# Patient Record
Sex: Female | Born: 1950
Health system: Southern US, Community
[De-identification: ages and names within clinical notes are randomized; demographics above are authoritative.]

## PROBLEM LIST (undated history)

## (undated) DIAGNOSIS — R2681 Unsteadiness on feet: Secondary | ICD-10-CM

## (undated) DIAGNOSIS — J189 Pneumonia, unspecified organism: Secondary | ICD-10-CM

## (undated) DIAGNOSIS — E119 Type 2 diabetes mellitus without complications: Secondary | ICD-10-CM

## (undated) DIAGNOSIS — Z9889 Other specified postprocedural states: Secondary | ICD-10-CM

## (undated) DIAGNOSIS — R519 Headache, unspecified: Secondary | ICD-10-CM

## (undated) DIAGNOSIS — M549 Dorsalgia, unspecified: Secondary | ICD-10-CM

## (undated) DIAGNOSIS — F32A Depression, unspecified: Secondary | ICD-10-CM

## (undated) DIAGNOSIS — E039 Hypothyroidism, unspecified: Secondary | ICD-10-CM

## (undated) DIAGNOSIS — I10 Essential (primary) hypertension: Secondary | ICD-10-CM

## (undated) DIAGNOSIS — R413 Other amnesia: Secondary | ICD-10-CM

## (undated) DIAGNOSIS — F329 Major depressive disorder, single episode, unspecified: Secondary | ICD-10-CM

## (undated) DIAGNOSIS — Z87442 Personal history of urinary calculi: Secondary | ICD-10-CM

## (undated) DIAGNOSIS — G459 Transient cerebral ischemic attack, unspecified: Secondary | ICD-10-CM

## (undated) DIAGNOSIS — F419 Anxiety disorder, unspecified: Secondary | ICD-10-CM

## (undated) DIAGNOSIS — K219 Gastro-esophageal reflux disease without esophagitis: Secondary | ICD-10-CM

## (undated) DIAGNOSIS — E079 Disorder of thyroid, unspecified: Secondary | ICD-10-CM

## (undated) DIAGNOSIS — R51 Headache: Secondary | ICD-10-CM

## (undated) DIAGNOSIS — M1990 Unspecified osteoarthritis, unspecified site: Secondary | ICD-10-CM

## (undated) DIAGNOSIS — K759 Inflammatory liver disease, unspecified: Secondary | ICD-10-CM

## (undated) DIAGNOSIS — R112 Nausea with vomiting, unspecified: Secondary | ICD-10-CM

## (undated) HISTORY — PX: KNEE SURGERY: SHX244

## (undated) HISTORY — PX: APPENDECTOMY: SHX54

## (undated) HISTORY — PX: VAGINA SURGERY: SHX829

## (undated) HISTORY — PX: ROTATOR CUFF REPAIR: SHX139

## (undated) HISTORY — PX: ABDOMINAL HYSTERECTOMY: SHX81

## (undated) HISTORY — PX: OTHER SURGICAL HISTORY: SHX169

## (undated) HISTORY — PX: BACK SURGERY: SHX140

## (undated) HISTORY — PX: BREAST SURGERY: SHX581

---

## 1999-08-09 DIAGNOSIS — R609 Edema, unspecified: Secondary | ICD-10-CM | POA: Insufficient documentation

## 1999-12-15 DIAGNOSIS — B181 Chronic viral hepatitis B without delta-agent: Secondary | ICD-10-CM | POA: Insufficient documentation

## 2000-09-05 DIAGNOSIS — Z800 Family history of malignant neoplasm of digestive organs: Secondary | ICD-10-CM | POA: Insufficient documentation

## 2001-01-08 DIAGNOSIS — Z86010 Personal history of colonic polyps: Secondary | ICD-10-CM | POA: Insufficient documentation

## 2010-02-04 DIAGNOSIS — I10 Essential (primary) hypertension: Secondary | ICD-10-CM | POA: Insufficient documentation

## 2010-12-27 DIAGNOSIS — E785 Hyperlipidemia, unspecified: Secondary | ICD-10-CM | POA: Insufficient documentation

## 2012-06-08 ENCOUNTER — Encounter (HOSPITAL_COMMUNITY): Payer: Self-pay | Admitting: *Deleted

## 2012-06-08 ENCOUNTER — Emergency Department (HOSPITAL_COMMUNITY)
Admission: EM | Admit: 2012-06-08 | Discharge: 2012-06-08 | Disposition: A | Discharge status: Home / Self Care | Payer: 59 | Attending: Emergency Medicine | Admitting: Emergency Medicine

## 2012-06-08 ENCOUNTER — Emergency Department (HOSPITAL_COMMUNITY): Payer: 59

## 2012-06-08 DIAGNOSIS — E079 Disorder of thyroid, unspecified: Secondary | ICD-10-CM | POA: Insufficient documentation

## 2012-06-08 DIAGNOSIS — Z7982 Long term (current) use of aspirin: Secondary | ICD-10-CM | POA: Insufficient documentation

## 2012-06-08 DIAGNOSIS — Z79899 Other long term (current) drug therapy: Secondary | ICD-10-CM | POA: Insufficient documentation

## 2012-06-08 DIAGNOSIS — W19XXXA Unspecified fall, initial encounter: Secondary | ICD-10-CM | POA: Insufficient documentation

## 2012-06-08 DIAGNOSIS — S63509A Unspecified sprain of unspecified wrist, initial encounter: Secondary | ICD-10-CM | POA: Insufficient documentation

## 2012-06-08 DIAGNOSIS — Z87891 Personal history of nicotine dependence: Secondary | ICD-10-CM | POA: Insufficient documentation

## 2012-06-08 DIAGNOSIS — S63501A Unspecified sprain of right wrist, initial encounter: Secondary | ICD-10-CM

## 2012-06-08 DIAGNOSIS — M129 Arthropathy, unspecified: Secondary | ICD-10-CM | POA: Insufficient documentation

## 2012-06-08 DIAGNOSIS — I10 Essential (primary) hypertension: Secondary | ICD-10-CM | POA: Insufficient documentation

## 2012-06-08 DIAGNOSIS — IMO0001 Reserved for inherently not codable concepts without codable children: Secondary | ICD-10-CM | POA: Insufficient documentation

## 2012-06-08 HISTORY — DX: Essential (primary) hypertension: I10

## 2012-06-08 HISTORY — DX: Disorder of thyroid, unspecified: E07.9

## 2012-06-08 HISTORY — DX: Dorsalgia, unspecified: M54.9

## 2012-06-08 NOTE — ED Provider Notes (Signed)
History     CSN: [id number]  Arrival date & time 06/08/12  1246   First MD Initiated Contact with Patient 06/08/12 1443      Chief Complaint  Patient presents with  . Hand Injury    (Consider location/radiation/quality/duration/timing/severity/associated sxs/prior treatment) HPI Comments: Patient states she fell last night on an outstretched right hand. Patient states she did not have any problem on last evening. Today he has had pain with movement of her fingers and pain in wrist. She has not had any previous operations or procedures on the hand or wrist. The patient does state however that she has arthritis and fibromyalgia. Patient presents today for evaluation of this pain.  Patient is a 61 y.o. female presenting with hand injury. The history is provided by the patient.  Hand Injury  The incident occurred yesterday.    Past Medical History  Diagnosis Date  . Back pain   . Hypertension   . Thyroid disease     Past Surgical History  Procedure Date  . Back surgery   . Breast surgery   . Thumb surgery     left  . Appendectomy   . Abdominal hysterectomy   . Knee surgery     left    History reviewed. No pertinent family history.  History  Substance Use Topics  . Smoking status: Former Games developer  . Smokeless tobacco: Not on file  . Alcohol Use: No    OB History    Grav Para Term Preterm Abortions TAB SAB Ect Mult Living                  Review of Systems  Constitutional: Negative for activity change.       All ROS Neg except as noted in HPI  HENT: Negative for nosebleeds and neck pain.   Eyes: Negative for photophobia and discharge.  Respiratory: Negative for cough, shortness of breath and wheezing.   Cardiovascular: Negative for chest pain and palpitations.  Gastrointestinal: Negative for abdominal pain and blood in stool.  Genitourinary: Negative for dysuria, frequency and hematuria.  Musculoskeletal: Positive for back pain and arthralgias.  Skin:  Negative.   Neurological: Negative for dizziness, seizures and speech difficulty.  Psychiatric/Behavioral: Negative for hallucinations and confusion.    Allergies  Penicillins and Sulfa antibiotics  Home Medications   Current Outpatient Rx  Name Route Sig Dispense Refill  . ASPIRIN 325 MG PO TABS Oral Take 325 mg by mouth daily.    . ATENOLOL 25 MG PO TABS Oral Take 25 mg by mouth daily.    . BUPROPION HCL ER (XL) 300 MG PO TB24 Oral Take 300 mg by mouth daily.    . CYCLOBENZAPRINE HCL 10 MG PO TABS Oral Take 10 mg by mouth 3 (three) times daily as needed. For muscle relaxer    . DIAZEPAM 5 MG PO TABS Oral Take 5 mg by mouth every 6 (six) hours as needed. For anxiety    . HYDROCODONE-ACETAMINOPHEN 5-325 MG PO TABS Oral Take 1 tablet by mouth every 4 (four) hours as needed. For pain    . LEVOTHYROXINE SODIUM 100 MCG PO TABS Oral Take 100 mcg by mouth daily. Take with tablet for total dose of daily    . LEVOTHYROXINE SODIUM 175 MCG PO TABS Oral Take 175 mcg by mouth daily. Take one tablet with tablet for total dose of    . ADULT MULTIVITAMIN W/MINERALS CH Oral Take 1 tablet by mouth daily.    Marland Kitchen  POTASSIUM CHLORIDE CRYS ER 20 MEQ PO TBCR Oral Take 40 mEq by mouth 2 (two) times daily.    Marland Kitchen PRESCRIPTION MEDICATION Oral Take 1 tablet by mouth 4 (four) times daily. For diarrhea    . VERAPAMIL HCL ER 240 MG PO TBCR Oral Take 240 mg by mouth daily.      BP 141/66  Pulse 68  Temp 97.4 F (36.3 C) (Oral)  Resp 18  Ht 5\' 5"  (1.651 m)  Wt 200 lb (90.719 kg)  BMI 33.28 kg/m2  SpO2 99%  Physical Exam  Nursing note and vitals reviewed. Constitutional: She is oriented to person, place, and time. She appears well-developed and well-nourished.  Non-toxic appearance.  HENT:  Head: Normocephalic.  Right Ear: Tympanic membrane and external ear normal.  Left Ear: Tympanic membrane and external ear normal.  Eyes: EOM and lids are normal. Pupils are equal, round, and  reactive to light.  Neck: Normal range of motion. Neck supple. Carotid bruit is not present.  Cardiovascular: Normal rate, regular rhythm, normal heart sounds, intact distal pulses and normal pulses.   Pulmonary/Chest: Breath sounds normal. No respiratory distress.  Abdominal: Soft. Bowel sounds are normal. There is no tenderness. There is no guarding.  Musculoskeletal: Normal range of motion.       Patient has rings on the right index finger and right ring finger. She refuses to have these cut off because they are family rings. She states she will take them off when she returns home.  There are degenerative changes of the MP joints of the right and left hand. There is pain in the navicular area on the right. There is good range of motion of the right thumb but would soreness. There is soreness with range of motion of the right wrist. There is no deformity of the forearm. There's no deformity or effusion of the right elbow or shoulder. Pulses are symmetrical. Capillary refill is less than 3 seconds.  Lymphadenopathy:       Head (right side): No submandibular adenopathy present.       Head (left side): No submandibular adenopathy present.    She has no cervical adenopathy.  Neurological: She is alert and oriented to person, place, and time. She has normal strength. No cranial nerve deficit or sensory deficit.  Skin: Skin is warm and dry.  Psychiatric: She has a normal mood and affect. Her speech is normal.    ED Course  Procedures (including critical care time)  Labs Reviewed - No data to display Dg Wrist Complete Right  06/08/2012  *RADIOLOGY REPORT*  Clinical Data: Wrist pain after falling today.  RIGHT WRIST - COMPLETE 3+ VIEW  Comparison: None.  Findings: Degenerative changes are seen at the first carpometacarpal joint.  There is no evidence for acute fracture or subluxation.  Intercarpal spaces are normal.  IMPRESSION: No evidence for acute  abnormality.  Original Report Authenticated By:  Patterson Hammersmith, M.D.   Dg Finger Thumb Right  06/08/2012  *RADIOLOGY REPORT*  Clinical Data: Larey Seat.  Hand injury.  Pain.  RIGHT THUMB 2+V  Comparison: Views of the wrist today.  Findings: There is no evidence for acute fracture or subluxation. Note is made of degenerative changes at the first carpometacarpal joint.  No radiopaque foreign body or soft tissue gas.  IMPRESSION: No evidence for acute  abnormality.  Original Report Authenticated By: Patterson Hammersmith, M.D.     1. Sprain of wrist, right       MDM  I  have reviewed nursing notes, vital signs, and all appropriate lab and imaging results for this patient. X-ray of the right wrist reveals degenerative changes at the first carpal metacarpal joint but no fracture or dislocation. X-ray of the right thumb shows no evidence of fracture or dislocation. The plan at this time is for the patient to be placed in a thumb spica splint. She is to be seen by her physician or orthopedics for evaluation due to to tenderness in the navicular area. Patient is to return to the emergency department if any acute changes or problems.       Kathie Dike, [place] 06/08/12 [phone]

## 2012-06-08 NOTE — ED Notes (Signed)
Pt states that she fell last night and caught herself with her right hand. C/o pain on movement especially in her thumb. No swelling or deformity noted.

## 2012-06-08 NOTE — ED Provider Notes (Signed)
Medical screening examination/treatment/procedure(s) were performed by non-physician practitioner and as supervising physician I was immediately available for consultation/collaboration.   Laray Anger, DO 06/08/12 1831

## 2013-09-09 DIAGNOSIS — Z79899 Other long term (current) drug therapy: Secondary | ICD-10-CM | POA: Insufficient documentation

## 2015-01-25 DIAGNOSIS — R7301 Impaired fasting glucose: Secondary | ICD-10-CM | POA: Insufficient documentation

## 2015-03-14 ENCOUNTER — Encounter (HOSPITAL_COMMUNITY): Payer: Self-pay

## 2015-03-14 ENCOUNTER — Emergency Department (HOSPITAL_COMMUNITY): Payer: PPO

## 2015-03-14 ENCOUNTER — Emergency Department (HOSPITAL_COMMUNITY)
Admission: EM | Admit: 2015-03-14 | Discharge: 2015-03-14 | Disposition: A | Discharge status: Home / Self Care | Payer: PPO | Attending: Emergency Medicine | Admitting: Emergency Medicine

## 2015-03-14 DIAGNOSIS — R109 Unspecified abdominal pain: Secondary | ICD-10-CM

## 2015-03-14 DIAGNOSIS — Z7982 Long term (current) use of aspirin: Secondary | ICD-10-CM | POA: Diagnosis not present

## 2015-03-14 DIAGNOSIS — Z880 Allergy status to penicillin: Secondary | ICD-10-CM | POA: Insufficient documentation

## 2015-03-14 DIAGNOSIS — S90811A Abrasion, right foot, initial encounter: Secondary | ICD-10-CM | POA: Insufficient documentation

## 2015-03-14 DIAGNOSIS — S301XXA Contusion of abdominal wall, initial encounter: Secondary | ICD-10-CM | POA: Diagnosis not present

## 2015-03-14 DIAGNOSIS — S80212A Abrasion, left knee, initial encounter: Secondary | ICD-10-CM | POA: Diagnosis not present

## 2015-03-14 DIAGNOSIS — Z720 Tobacco use: Secondary | ICD-10-CM | POA: Diagnosis not present

## 2015-03-14 DIAGNOSIS — Y9289 Other specified places as the place of occurrence of the external cause: Secondary | ICD-10-CM | POA: Insufficient documentation

## 2015-03-14 DIAGNOSIS — S20469A Insect bite (nonvenomous) of unspecified back wall of thorax, initial encounter: Secondary | ICD-10-CM | POA: Diagnosis not present

## 2015-03-14 DIAGNOSIS — J159 Unspecified bacterial pneumonia: Secondary | ICD-10-CM | POA: Insufficient documentation

## 2015-03-14 DIAGNOSIS — S80211A Abrasion, right knee, initial encounter: Secondary | ICD-10-CM | POA: Diagnosis not present

## 2015-03-14 DIAGNOSIS — J189 Pneumonia, unspecified organism: Secondary | ICD-10-CM

## 2015-03-14 DIAGNOSIS — E079 Disorder of thyroid, unspecified: Secondary | ICD-10-CM | POA: Diagnosis not present

## 2015-03-14 DIAGNOSIS — I10 Essential (primary) hypertension: Secondary | ICD-10-CM | POA: Insufficient documentation

## 2015-03-14 DIAGNOSIS — R05 Cough: Secondary | ICD-10-CM

## 2015-03-14 DIAGNOSIS — Z8739 Personal history of other diseases of the musculoskeletal system and connective tissue: Secondary | ICD-10-CM | POA: Diagnosis not present

## 2015-03-14 DIAGNOSIS — W1839XA Other fall on same level, initial encounter: Secondary | ICD-10-CM | POA: Insufficient documentation

## 2015-03-14 DIAGNOSIS — S30860A Insect bite (nonvenomous) of lower back and pelvis, initial encounter: Secondary | ICD-10-CM

## 2015-03-14 DIAGNOSIS — Y998 Other external cause status: Secondary | ICD-10-CM | POA: Diagnosis not present

## 2015-03-14 DIAGNOSIS — Z79899 Other long term (current) drug therapy: Secondary | ICD-10-CM | POA: Insufficient documentation

## 2015-03-14 DIAGNOSIS — Y9389 Activity, other specified: Secondary | ICD-10-CM | POA: Insufficient documentation

## 2015-03-14 DIAGNOSIS — R059 Cough, unspecified: Secondary | ICD-10-CM

## 2015-03-14 DIAGNOSIS — W57XXXA Bitten or stung by nonvenomous insect and other nonvenomous arthropods, initial encounter: Secondary | ICD-10-CM

## 2015-03-14 LAB — BASIC METABOLIC PANEL
Anion gap: 6 (ref 5–15)
BUN: 12 mg/dL (ref 6–20)
CO2: 22 mmol/L (ref 22–32)
Calcium: 8.9 mg/dL (ref 8.9–10.3)
Chloride: 110 mmol/L (ref 101–111)
Creatinine, Ser: 0.94 mg/dL (ref 0.44–1.00)
GFR calc Af Amer: >60 mL/min (ref >60)
GLUCOSE: 120 mg/dL — AB (ref 65–99)
Potassium: 3.9 mmol/L (ref 3.5–5.1)
SODIUM: 138 mmol/L (ref 135–145)

## 2015-03-14 LAB — CBC WITH DIFFERENTIAL/PLATELET
BASOS ABS: 0 10*3/uL (ref 0.0–0.1)
BASOS PCT: 0 % (ref 0–1)
EOS ABS: 0.2 10*3/uL (ref 0.0–0.7)
EOS PCT: 2 % (ref 0–5)
HEMATOCRIT: 42.2 % (ref 36.0–46.0)
Hemoglobin: 13.4 g/dL (ref 12.0–15.0)
LYMPHS ABS: 1.8 10*3/uL (ref 0.7–4.0)
Lymphocytes Relative: 20 % (ref 12–46)
MCH: 30.4 pg (ref 26.0–34.0)
MCHC: 31.8 g/dL (ref 30.0–36.0)
MCV: 95.7 fL (ref 78.0–100.0)
MONO ABS: 0.5 10*3/uL (ref 0.1–1.0)
MONOS PCT: 6 % (ref 3–12)
Neutro Abs: 6.4 10*3/uL (ref 1.7–7.7)
Neutrophils Relative %: 72 % (ref 43–77)
PLATELETS: 274 10*3/uL (ref 150–400)
RBC: 4.41 MIL/uL (ref 3.87–5.11)
RDW: 13.3 % (ref 11.5–15.5)
WBC: 8.9 10*3/uL (ref 4.0–10.5)

## 2015-03-14 MED ORDER — SODIUM CHLORIDE 0.9 % IV BOLUS (SEPSIS)
250.0000 mL | Freq: Once | INTRAVENOUS | Status: AC
  Administered 2015-03-14: 1000 mL via INTRAVENOUS

## 2015-03-14 MED ORDER — ONDANSETRON HCL 4 MG/2ML IJ SOLN
4.0000 mg | Freq: Once | INTRAMUSCULAR | Status: AC
  Administered 2015-03-14: 4 mg via INTRAVENOUS
  Filled 2015-03-14: qty 2

## 2015-03-14 MED ORDER — DOXYCYCLINE HYCLATE 100 MG PO TABS
100.0000 mg | ORAL_TABLET | Freq: Once | ORAL | Status: AC
  Administered 2015-03-14: 100 mg via ORAL
  Filled 2015-03-14: qty 1

## 2015-03-14 MED ORDER — IPRATROPIUM-ALBUTEROL 0.5-2.5 (3) MG/3ML IN SOLN
3.0000 mL | Freq: Once | RESPIRATORY_TRACT | Status: AC
  Administered 2015-03-14: 3 mL via RESPIRATORY_TRACT
  Filled 2015-03-14: qty 3

## 2015-03-14 MED ORDER — SODIUM CHLORIDE 0.9 % IV SOLN: INTRAVENOUS | Status: DC

## 2015-03-14 MED ORDER — LEVOFLOXACIN 750 MG PO TABS: 750.0000 mg | ORAL_TABLET | Freq: Every day | ORAL | Status: DC

## 2015-03-14 MED ORDER — IOHEXOL 300 MG/ML  SOLN
100.0000 mL | Freq: Once | INTRAMUSCULAR | Status: AC | PRN
  Administered 2015-03-14: 100 mL via INTRAVENOUS

## 2015-03-14 MED ORDER — LEVOFLOXACIN IN D5W 750 MG/150ML IV SOLN
750.0000 mg | Freq: Once | INTRAVENOUS | Status: AC
  Administered 2015-03-14: 750 mg via INTRAVENOUS
  Filled 2015-03-14: qty 150

## 2015-03-14 MED ORDER — IOHEXOL 300 MG/ML  SOLN
50.0000 mL | Freq: Once | INTRAMUSCULAR | Status: AC | PRN
  Administered 2015-03-14: 50 mL via ORAL

## 2015-03-14 MED ORDER — DOXYCYCLINE HYCLATE 100 MG PO CAPS: 100.0000 mg | ORAL_CAPSULE | Freq: Two times a day (BID) | ORAL | Status: DC

## 2015-03-14 NOTE — ED Notes (Signed)
Dr Zackowski at bedside,  

## 2015-03-14 NOTE — ED Notes (Signed)
Pt assisted to restroom, tolerated well, pt and family updated on plan of care,

## 2015-03-14 NOTE — ED Notes (Signed)
Pt states that she has been having problems with coughing and not able to sleep, had started having a cough earlier in the week, fell a few days ago when her ?knee gave out on her causing her to land on her right side, pt states that the cough became worse after the fall, is productive with brown sputum, denies any fever, chills, c/o pain to right shoulder, hip area from fall,

## 2015-03-14 NOTE — ED Notes (Signed)
Pt denise any nausea or vomiting

## 2015-03-14 NOTE — ED Notes (Signed)
Pt c/o nausea, zofran given per order,

## 2015-03-14 NOTE — Discharge Instructions (Signed)
Chest x-rays shows evidence of bilateral pneumonia. Take the antibiotic Levaquin as directed for a week. Take anabolic doxycycline for the tick exposure. Continue that for 2 weeks. Usual albuterol inhaler 2 puffs every 6 hours for the next 7 days. Take Mucinex DM 12 hour capsule for the cough and congestion. Expect you to be improving in a couple days. Return for any new or worse symptoms. Make appointment to follow-up with your doctor in [place]. In addition CT showed evidence of a large stone up in the kidney area on the right side. Follow-up with your regular doctor about this. Lab and CT results provided.

## 2015-03-14 NOTE — ED Notes (Signed)
Pt c/o tick to mid upper back area, Dr Rogene Houston notified,

## 2015-03-14 NOTE — ED Notes (Signed)
resp paged for breathing treatment,  

## 2015-03-14 NOTE — ED Notes (Signed)
Reports coughing since Thursday and unable to sleep due to cough. Coughing up dark mucous. Occassional SOB. Reports falling on Right side Wednesday prior to cough starting

## 2015-03-14 NOTE — ED Provider Notes (Signed)
CSN: [id number]     Arrival date & time 03/14/15  1040 History  This chart was scribed for Courtney Sorrow, MD by Dellis Filbert, ED Scribe. The patient was seen in [facility] and the patient's care was started at 11:09 AM.  Chief Complaint  Patient presents with  . Cough   Patient is a 64 y.o. female presenting with cough. The history is provided by the patient and a relative. No language interpreter was used.  Cough Cough characteristics:  Productive Sputum characteristics:  Nondescript Severity:  Mild Onset quality:  Gradual Duration:  4 days Timing:  Intermittent Progression:  Worsening Chronicity:  Recurrent Relieved by:  Nothing Ineffective treatments: OTC cough and cold  Associated symptoms: chills, headaches, rhinorrhea and shortness of breath   Associated symptoms: no chest pain, no fever, no rash and no sore throat    HPI Comments: Courtney Orozco is a 64 y.o. female who presents to the Emergency Department complaining of a persistent, productive and intermittent cough that worsened 4 days ago. Rhinorrhea associated with her cough. She has taken an OTC cold and cough medicine. No history of emphysema, COPD or asthma. She denies fever. Pt fell 5 days ago, going across the walkway, her right knee buckled and she fell. PCP in [place]  Past Medical History  Diagnosis Date  . Back pain   . Hypertension   . Thyroid disease    Past Surgical History  Procedure Laterality Date  . Back surgery    . Breast surgery    . Thumb surgery      left  . Appendectomy    . Abdominal hysterectomy    . Knee surgery      left   No family history on file. History  Substance Use Topics  . Smoking status: Current Every Day Smoker -- 0.50 packs/day    Types: Cigarettes  . Smokeless tobacco: Not on file  . Alcohol Use: No   OB History    No data available     Review of Systems  Constitutional: Positive for chills. Negative for fever.  HENT: Positive for rhinorrhea. Negative  for sore throat.   Eyes: Negative for visual disturbance.  Respiratory: Positive for cough and shortness of breath.   Cardiovascular: Negative for chest pain and leg swelling.  Gastrointestinal: Positive for abdominal pain. Negative for nausea, vomiting and diarrhea.  Genitourinary: Negative for dysuria and hematuria.  Musculoskeletal: Positive for back pain.  Skin: Negative for rash.  Neurological: Positive for headaches.  Hematological: Bruises/bleeds easily.   Allergies  Azithromycin; Penicillins; and Sulfa antibiotics  Home Medications   Prior to Admission medications   Medication Sig Start Date End Date Taking? Authorizing Provider  aspirin 325 MG tablet Take 325 mg by mouth daily.   Yes Historical Provider, MD  buPROPion (WELLBUTRIN XL) 300 MG 24 hr tablet Take 1 tablet by mouth daily. 02/10/15  Yes Historical Provider, MD  cyclobenzaprine (FLEXERIL) 10 MG tablet Take 10 mg by mouth 3 (three) times daily as needed. For muscle relaxer   Yes Historical Provider, MD  diazepam (VALIUM) 5 MG tablet Take 5 mg by mouth every 6 (six) hours as needed. For anxiety   Yes Historical Provider, MD  HYDROcodone-acetaminophen (NORCO/VICODIN) 5-325 MG per tablet Take 1 tablet by mouth every 6 (six) hours as needed for moderate pain.  01/25/15  Yes Historical Provider, MD  levothyroxine (SYNTHROID, LEVOTHROID) 100 MCG tablet Take 100 mcg by mouth daily. Take with 113mcg tablet for total dose of  271mcg daily   Yes Historical Provider, MD  levothyroxine (SYNTHROID, LEVOTHROID) 175 MCG tablet Take 175 mcg by mouth daily. Take one tablet with 193mcg tablet for total dose of 282mcg   Yes Historical Provider, MD  Multiple Vitamin (MULTIVITAMIN WITH MINERALS) TABS Take 1 tablet by mouth daily.   Yes Historical Provider, MD  nortriptyline (PAMELOR) 25 MG capsule Take 3 capsules by mouth at bedtime.  05/13/12  Yes Historical Provider, MD  potassium chloride SA (KLOR-CON M20) 20 MEQ tablet Take 40 mEq by mouth 2  (two) times daily.   Yes Historical Provider, MD  temazepam (RESTORIL) 30 MG capsule Take 1 capsule by mouth at bedtime as needed. 03/09/15  Yes Historical Provider, MD  topiramate (TOPAMAX) 200 MG tablet Take 1 tablet by mouth 2 (two) times daily. 10/23/11  Yes Historical Provider, MD  verapamil (CALAN-SR) 240 MG CR tablet Take 240 mg by mouth daily.   Yes Historical Provider, MD  doxycycline (VIBRAMYCIN) 100 MG capsule Take 1 capsule (100 mg total) by mouth 2 (two) times daily. 03/14/15   Courtney Sorrow, MD  levofloxacin (LEVAQUIN) 750 MG tablet Take 1 tablet (750 mg total) by mouth daily. 03/14/15   Courtney Sorrow, MD   BP 106/56 mmHg  Pulse 84  Temp(Src) 97.8 F (36.6 C) (Oral)  Resp 18  Ht 5\' 5"  (1.651 m)  Wt 210 lb (95.255 kg)  BMI 34.95 kg/m2  SpO2 94% Physical Exam  Constitutional: She is oriented to person, place, and time. She appears well-developed and well-nourished. No distress.  HENT:  Head: Normocephalic and atraumatic.  Mucous membranes are moist.  Eyes: Conjunctivae and EOM are normal. Pupils are equal, round, and reactive to light. No scleral icterus.  Neck: Normal range of motion.  Cardiovascular: Normal rate, regular rhythm and normal heart sounds.   No murmur heard. Pulmonary/Chest: Effort normal and breath sounds normal. No respiratory distress. She has no wheezes.  Musculoskeletal: Normal range of motion. She exhibits no edema.  A large engorged tic measuring probably about 7 mm in size the mid back, sort of at the junction between the lumbar and thoracic area.  Neurological: She is alert and oriented to person, place, and time. No cranial nerve deficit.  Skin: Skin is warm and dry.  Bruise RLQ 5 cm by 2 cm. Abrasion top of her right foot, as well on the left and right knees.  Psychiatric: She has a normal mood and affect. Her behavior is normal.  Nursing note and vitals reviewed.   ED Course  Procedures  DIAGNOSTIC STUDIES: Oxygen Saturation is 97% on room  air, normal by my interpretation.    COORDINATION OF CARE: 11:15 AM Discussed treatment plan with pt at bedside and pt agreed to plan. Labs Review Results for orders placed or performed during the hospital encounter of 03/14/15  CBC with Differential  Result Value Ref Range   WBC 8.9 4.0 - 10.5 K/uL   RBC 4.41 3.87 - 5.11 MIL/uL   Hemoglobin 13.4 12.0 - 15.0 g/dL   HCT 42.2 36.0 - 46.0 %   MCV 95.7 78.0 - 100.0 fL   MCH 30.4 26.0 - 34.0 pg   MCHC 31.8 30.0 - 36.0 g/dL   RDW 13.3 11.5 - 15.5 %   Platelets 274 150 - 400 K/uL   Neutrophils Relative % 72 43 - 77 %   Neutro Abs 6.4 1.7 - 7.7 K/uL   Lymphocytes Relative 20 12 - 46 %   Lymphs Abs 1.8 0.7 -  4.0 K/uL   Monocytes Relative 6 3 - 12 %   Monocytes Absolute 0.5 0.1 - 1.0 K/uL   Eosinophils Relative 2 0 - 5 %   Eosinophils Absolute 0.2 0.0 - 0.7 K/uL   Basophils Relative 0 0 - 1 %   Basophils Absolute 0.0 0.0 - 0.1 K/uL  Basic metabolic panel  Result Value Ref Range   Sodium 138 135 - 145 mmol/L   Potassium 3.9 3.5 - 5.1 mmol/L   Chloride 110 101 - 111 mmol/L   CO2 22 22 - 32 mmol/L   Glucose, Bld 120 (H) 65 - 99 mg/dL   BUN 12 6 - 20 mg/dL   Creatinine, Ser 0.94 0.44 - 1.00 mg/dL   Calcium 8.9 8.9 - 10.3 mg/dL   GFR calc non Af Amer >60 >60 mL/min   GFR calc Af Amer >60 >60 mL/min   Anion gap 6 5 - 15   Dg Chest 2 View  03/14/2015   CLINICAL DATA:  Cough for the past week.  EXAM: CHEST  2 VIEW  COMPARISON:  None.  FINDINGS: Normal sized heart. Right diaphragmatic eventration with underlying gas distended hepatic flexure. Small amount of linear and patchy density in the left lower lobe. Diffuse osteopenia.  IMPRESSION: 1. Mild left lower lobe atelectasis and possible pneumonia. 2. Gas distended colon interposition beneath a large right diaphragmatic eventration.   Electronically Signed   By: Claudie Revering M.D.   On: 03/14/2015 11:49   Ct Chest Wo Contrast  03/14/2015   CLINICAL DATA:  Productive cough with brown sputum.  Abdominal pain. History of fall 1 week ago.  EXAM: CT CHEST WITHOUT CONTRAST  CT ABDOMEN AND PELVIS WITH CONTRAST  TECHNIQUE: Multidetector CT imaging of the chest was performed following the standard protocol without IV contrast. CT imaging of the abdomen and pelvis was performed following the administration of intravenous contrast.  CONTRAST:  100 cubic cm Omnipaque 300 IV  COMPARISON:  None.  FINDINGS: CT CHEST FINDINGS  Mediastinum/Nodes: Heart is normal size. Aortic calcifications. No aneurysm. No mediastinal, hilar, or axillary adenopathy.  Lungs/Pleura: Airspace opacities are noted in both lower lobes, left slightly greater than right concerning for pneumonia. Remainder of the lungs are clear. No pleural effusions.  Chest wall: Chest wall soft tissues are unremarkable.  Musculoskeletal: No acute bony abnormality or focal bone lesion.  CT ABDOMEN PELVIS FINDINGS  Hepatobiliary: No biliary ductal dilatation or focal hepatic lesion. Gallbladder grossly unremarkable.  Pancreas: No focal abnormality or ductal dilatation.  Spleen: No focal abnormality.  Normal size.  Adrenals/Urinary Tract: 8-9 mm stone in the right renal pelvis. Slight fullness of the right renal pelvis with apparent mild wall enhancement. No ureteral dilatation or ureteral stone. Urinary bladder is unremarkable. Left kidney unremarkable.  Stomach/Bowel: Stomach, large and small bowel grossly unremarkable.  Vascular/Lymphatic: Go  Reproductive: No mass or other significant abnormality. Prior hysterectomy.  Other: No free fluid or free air.  Musculoskeletal: No focal bone lesion or acute bony abnormality.  IMPRESSION: Bilateral lower lobe airspace opacities, left slightly greater than right concerning for multifocal pneumonia.  8-9 mm right renal pelvic stone. There is mild wall thickening and enhancement of the renal pelvic wall. Cannot exclude infection. Recommend clinical correlation for evidence of UTI.   Electronically Signed   By: Rolm Baptise M.D.   On: 03/14/2015 14:50   Ct Abdomen Pelvis W Contrast  03/14/2015   CLINICAL DATA:  Productive cough with brown sputum. Abdominal pain. History of  fall 1 week ago.  EXAM: CT CHEST WITHOUT CONTRAST  CT ABDOMEN AND PELVIS WITH CONTRAST  TECHNIQUE: Multidetector CT imaging of the chest was performed following the standard protocol without IV contrast. CT imaging of the abdomen and pelvis was performed following the administration of intravenous contrast.  CONTRAST:  100 cubic cm Omnipaque 300 IV  COMPARISON:  None.  FINDINGS: CT CHEST FINDINGS  Mediastinum/Nodes: Heart is normal size. Aortic calcifications. No aneurysm. No mediastinal, hilar, or axillary adenopathy.  Lungs/Pleura: Airspace opacities are noted in both lower lobes, left slightly greater than right concerning for pneumonia. Remainder of the lungs are clear. No pleural effusions.  Chest wall: Chest wall soft tissues are unremarkable.  Musculoskeletal: No acute bony abnormality or focal bone lesion.  CT ABDOMEN PELVIS FINDINGS  Hepatobiliary: No biliary ductal dilatation or focal hepatic lesion. Gallbladder grossly unremarkable.  Pancreas: No focal abnormality or ductal dilatation.  Spleen: No focal abnormality.  Normal size.  Adrenals/Urinary Tract: 8-9 mm stone in the right renal pelvis. Slight fullness of the right renal pelvis with apparent mild wall enhancement. No ureteral dilatation or ureteral stone. Urinary bladder is unremarkable. Left kidney unremarkable.  Stomach/Bowel: Stomach, large and small bowel grossly unremarkable.  Vascular/Lymphatic: Go  Reproductive: No mass or other significant abnormality. Prior hysterectomy.  Other: No free fluid or free air.  Musculoskeletal: No focal bone lesion or acute bony abnormality.  IMPRESSION: Bilateral lower lobe airspace opacities, left slightly greater than right concerning for multifocal pneumonia.  8-9 mm right renal pelvic stone. There is mild wall thickening and enhancement of the  renal pelvic wall. Cannot exclude infection. Recommend clinical correlation for evidence of UTI.   Electronically Signed   By: Rolm Baptise M.D.   On: 03/14/2015 14:50   Dg Chest Port 1 View  03/14/2015   CLINICAL DATA:  Cough for the past week.  EXAM: PORTABLE CHEST - 1 VIEW  COMPARISON:  Earlier today.  FINDINGS: The current image was obtained to decrease the exposure technique. This results in an underexposed image with poor visualization of the left lung base. The remainder of the lungs are clear. The other previously described findings are unchanged.  IMPRESSION: Underexposed image with no additional information provided. Please see the previous report.   Electronically Signed   By: Claudie Revering M.D.   On: 03/14/2015 12:22    Medications  0.9 %  sodium chloride infusion (not administered)  levofloxacin (LEVAQUIN) IVPB 750 mg (750 mg Intravenous New Bag/Given 03/14/15 1536)  ipratropium-albuterol (DUONEB) 0.5-2.5 (3) MG/3ML nebulizer solution 3 mL (3 mLs Nebulization Given 03/14/15 1131)  sodium chloride 0.9 % bolus 250 mL (0 mLs Intravenous Stopped 03/14/15 1342)  ondansetron (ZOFRAN) injection 4 mg (4 mg Intravenous Given 03/14/15 1334)  iohexol (OMNIPAQUE) 300 MG/ML solution 50 mL (50 mLs Oral Contrast Given 03/14/15 1351)  iohexol (OMNIPAQUE) 300 MG/ML solution 100 mL (100 mLs Intravenous Contrast Given 03/14/15 1352)  doxycycline (VIBRA-TABS) tablet 100 mg (100 mg Oral Given 03/14/15 1536)     MDM   Final diagnoses:  Abdominal pain  CAP (community acquired pneumonia)  Tick bite of back, initial encounter   Patient with several significant findings during the stay. Patient came in for persistent cough since Thursday. Also coughing up a dark mucus. Some shortness of breath but just intermittent. Patient also had a fall earlier this week landing on her right side and had a bruise to her right lower quadrant. Denied any significant abdominal pain.  Record chest x-ray raises some  concerns for  possible pneumonia. CT of chest was ordered which confirmed a multifocal pneumonia. The record chest x-ray also raises some concerns about some abnormalities in the upper abdomen. Since CT of abdomen and pelvis was ordered particular in light of the recent fall and trauma. Results of that were negative except for the finding of a very large right renal pelvis stone. Patient thinks maybe that's been present in the past. Does not seem to be having any symptoms from this. Follow-up will be important.  Also we discovered a very large tic in the midportion of her back between in the upper lumbar lower thoracic area this tick was removed.   Patients will require treatment for the pneumonia she has a penicillin and Zithromax allergy so patient started on Levaquin. Patient has been on that in the past and is something that works well for her prior episodes of pneumonia. Patient has not been hospitalized recently so this is a community-acquired pneumonia. In addition for the tick bite patient started on doxycycline. Doxycycline may have been adequate for a community-acquired pneumonia however patient was very adamant that she really prefers taking Levaquin so patient started on both. The docs cycling dyes for coverage of any tickborne illness due to the tick exposure.  Patient has albuterol and his next DM available at home she will continue those. Patient given a nebulizer treatment here which helped improve her breathing she never had any wheezing. Patient's oxygen saturations of anywhere from 91% to 97%. Patient in no acute respiratory distress. Patient has a primary care Dr. in the [place] area.  As stated above tick removed. Removed with tweezers head completely removed. No evidence of significant infection. No rash.     I personally performed the services described in this documentation, which was scribed in my presence. The recorded information has been reviewed and is accurate.       Courtney Sorrow, MD 03/14/15 [phone]

## 2015-03-19 ENCOUNTER — Ambulatory Visit (INDEPENDENT_AMBULATORY_CARE_PROVIDER_SITE_OTHER): Payer: PPO | Admitting: Urology

## 2015-03-19 DIAGNOSIS — R312 Other microscopic hematuria: Secondary | ICD-10-CM | POA: Diagnosis not present

## 2015-03-19 DIAGNOSIS — N200 Calculus of kidney: Secondary | ICD-10-CM | POA: Diagnosis not present

## 2015-03-19 DIAGNOSIS — N390 Urinary tract infection, site not specified: Secondary | ICD-10-CM

## 2015-03-26 DIAGNOSIS — L304 Erythema intertrigo: Secondary | ICD-10-CM | POA: Insufficient documentation

## 2015-03-30 ENCOUNTER — Other Ambulatory Visit: Payer: Self-pay | Admitting: Urology

## 2015-04-01 ENCOUNTER — Encounter (HOSPITAL_COMMUNITY): Payer: Self-pay | Admitting: *Deleted

## 2015-04-05 MED ORDER — GENTAMICIN SULFATE 40 MG/ML IJ SOLN
5.0000 mg/kg | INTRAVENOUS | Status: AC
  Administered 2015-04-06: 476.4 mg via INTRAVENOUS
  Filled 2015-04-05: qty 12

## 2015-04-05 NOTE — H&P (Signed)
Active Problems  1. Microscopic hematuria (R31.2)  2. Nephrolithiasis (N20.0)  3. Pyuria (N39.0)  History of Present Illness   Kamelia was in the emergency room Sunday for pneumonia and had a CT because the CXR was not revealing.  She was found to have an 8-77mm right renal pelvic stone without obstruction.   She has never been treated for a stone before.  She had a bladder tack remotely.  She has had no recent UTI's.   She is on Levaquin and Doxycycline for the pneumonia.  She has had occasional discomfort in her right lower abdomen.  The pain is moderate.   She has chronic back pain with radiculopathy.  She has been on topamax for 5 years.   Past Medical History  1. History of Anxiety (F41.9)  2. History of arthritis (Z87.39)  3. History of depression (Z86.59)  4. History of diabetes mellitus (Z86.39)  5. History of esophageal reflux (Z87.19)  6. History of hepatitis (Z86.19)  7. History of hypercholesterolemia (Z86.39)  8. History of hypertension (Z86.79)  9. History of hyperthyroidism (Z86.39)  10. History of migraine headaches (Z86.69)  11. History of Recurrent nephrolithiasis (N20.0)  Surgical History  1. History of Anterior Colporrhaphy, Repair Of Cystocele  2. History of Appendectomy  3. History of Arthrodesis Thumb Carpometacarpal Joint  4. History of Back Surgery  5. History of Biopsy Breast Open  6. History of Knee Surgery  7. History of Shoulder Surgery Left  8. History of Total Abdominal Hysterectomy With Removal Of Both Ovaries  9. History of Treatment Of The Right Ankle  Current Meds  1. Aspirin 325 MG Oral Tablet;  Therapy: (Recorded:27May2016) to Recorded  2. BuPROPion HCl ER (XL) 300 MG Oral Tablet Extended Release 24 Hour;  Therapy: (Recorded:27May2016) to Recorded  3. Cyclobenzaprine HCl - 5 MG Oral Tablet;  Therapy: (Recorded:27May2016) to Recorded  4. Diazepam 5 MG Oral Tablet;  Therapy: (Recorded:27May2016) to Recorded  5. Hydrocodone-Acetaminophen 5-325  MG Oral Tablet;  Therapy: (Recorded:27May2016) to Recorded  6. Levothyroxine Sodium 100 MCG Oral Tablet;  Therapy: (Recorded:27May2016) to Recorded  7. Nortriptyline HCl CAPS;  Therapy: (Recorded:27May2016) to Recorded  8. Potassium Chloride 20 MEQ TBCR;  Therapy: (Recorded:27May2016) to Recorded  9. Probiotic Oral Capsule;  Therapy: (Recorded:27May2016) to Recorded  10. Temazepam 30 MG Oral Capsule;   Therapy: (Recorded:27May2016) to Recorded  11. Topiramate 100 MG Oral Tablet;   Therapy: (Recorded:27May2016) to Recorded  12. Verapamil HCl ER 240 MG Oral Capsule Extended Release 24 Hour;   Therapy: (Recorded:27May2016) to Recorded  Allergies  1. Cipro TABS  2. Penicillins  3. Sulfa Drugs  Family History  1. Family history of Death : Father  2. Family history of cardiac disorder (Z82.49) : Father  3. Family history of prostate cancer (Z80.42) : Grandparent  4. Family history of Hematuria, microscopic : Grandparent  5. Family history of Recurrent nephrolithiasis : Grandparent  Social History   Denied: History of Alcohol use   Caffeine use (F15.90)   6-8   Disabled   Married   Never used tobacco (Z78.9)   Number of children   2 daughters  Review of Systems Genitourinary, constitutional, skin, eye, otolaryngeal, hematologic/lymphatic, cardiovascular, pulmonary, endocrine, musculoskeletal, gastrointestinal, neurological and psychiatric system(s) were reviewed and pertinent findings if present are noted and are otherwise negative.  Genitourinary: dysuria, nocturia, urinary hesitancy and urinary stream starts and stops.  Gastrointestinal: nausea.  Constitutional: night sweats and feeling tired (fatigue).  Hematologic/Lymphatic: a tendency to easily bruise.  Respiratory:  shortness of breath and cough.  Endocrine: polydipsia.  Musculoskeletal: back pain and joint pain.  Neurological: headache.  Psychiatric: depression and anxiety.    Vitals Vital Signs [Data  Includes: Last 1 Day]  Recorded: [id number] 02:19PM  Height: 5 ft 5 in Weight: 210 lb  BMI Calculated: 34.95 BSA Calculated: 2.02 Blood Pressure: 128 / 79 Temperature: 98.1 F Heart Rate: 97  Physical Exam Constitutional: Well nourished and well developed . No acute distress. Obese.  ENT:. The ears and nose are normal in appearance.  Neck: The appearance of the neck is normal and no neck mass is present.  Pulmonary: No respiratory distress and normal respiratory rhythm and effort.  Cardiovascular: Heart rate and rhythm are normal . No peripheral edema.  Abdomen: The abdomen is obese. The abdomen is soft and nontender. No masses are palpated. moderate right CVA tenderness and no left CVA tenderness. No hernias are palpable. No hepatosplenomegaly noted.  Lymphatics: The posterior cervical and supraclavicular nodes are not enlarged or tender.  Skin: Normal skin turgor, no visible rash and no visible skin lesions.  Neuro/Psych:. Mood and affect are appropriate.    Results/Data Urine [Data Includes: Last 1 Day]   [id number]  COLOR YELLOW   APPEARANCE CLOUDY   SPECIFIC GRAVITY 1.025   pH 5.0   GLUCOSE NEG mg/dL  BILIRUBIN NEG   KETONE NEG mg/dL  BLOOD LARGE   PROTEIN 30 mg/dL  UROBILINOGEN 0.2 mg/dL  NITRITE NEG   LEUKOCYTE ESTERASE MOD   SQUAMOUS EPITHELIAL/HPF RARE   WBC 3-6 WBC/hpf  RBC 7-10 RBC/hpf  BACTERIA MODERATE   CRYSTALS NONE SEEN   CASTS NONE SEEN   Other QNS TO SPIN    The following images/tracing/specimen were independently visualized:  I have reviewed her CT films and report.  The following clinical lab reports were reviewed:  UA reviewed along with hospital labs which are unremarkable.    Assessment  1. Nephrolithiasis (N20.0)  2. Microscopic hematuria (R31.2)  3. Pyuria (N39.0)   She has a symptomatic 50mm right renal stone which is lateral in the pelvis and may actually be hung up in a proximal infundibulum.    Her UA is abnormal but is possibly  contaminated as she is on levaquin and doxycycline.   Plan Microscopic hematuria   1. URINE CULTURE; Status:Hold For - Specimen/Data Collection,Appointment; Requested  for:27May2016;  Nephrolithiasis   2. Follow-up Schedule Surgery Office  Follow-up  Status: Hold For - Appointment   Requested for: [id number]  3. UA With REFLEX; [Do Not Release]; Status:Hold For - [employer];  Requested for:27May2016;    I discussed ESWL vs Ureteroscopy and she would like to proceed with ureteroscopy.  I reviewed the risks of bleeding, infection, ureteral injury, need for a stent and secondary procedures, thrombotic events and anesthetic risks. I will get her cleared by her PCP because of recent pneumonia.   Discussion/Summary  CC: Dr. Loletha Carrow in [place].

## 2015-04-06 ENCOUNTER — Ambulatory Visit (HOSPITAL_COMMUNITY): Payer: PPO | Admitting: Certified Registered"

## 2015-04-06 ENCOUNTER — Ambulatory Visit (HOSPITAL_COMMUNITY)
Admission: RE | Admit: 2015-04-06 | Discharge: 2015-04-07 | Disposition: A | Discharge status: Home / Self Care | Payer: PPO | Source: Ambulatory Visit | Attending: Urology | Admitting: Urology

## 2015-04-06 ENCOUNTER — Encounter (HOSPITAL_COMMUNITY): Payer: Self-pay | Admitting: *Deleted

## 2015-04-06 ENCOUNTER — Encounter (HOSPITAL_COMMUNITY)
Admission: RE | Disposition: A | Discharge status: Home / Self Care | Payer: Self-pay | Source: Ambulatory Visit | Attending: Urology

## 2015-04-06 DIAGNOSIS — Z882 Allergy status to sulfonamides status: Secondary | ICD-10-CM | POA: Diagnosis not present

## 2015-04-06 DIAGNOSIS — R05 Cough: Secondary | ICD-10-CM | POA: Insufficient documentation

## 2015-04-06 DIAGNOSIS — M549 Dorsalgia, unspecified: Secondary | ICD-10-CM | POA: Diagnosis not present

## 2015-04-06 DIAGNOSIS — I10 Essential (primary) hypertension: Secondary | ICD-10-CM | POA: Diagnosis not present

## 2015-04-06 DIAGNOSIS — Z87891 Personal history of nicotine dependence: Secondary | ICD-10-CM | POA: Diagnosis not present

## 2015-04-06 DIAGNOSIS — N8110 Cystocele, unspecified: Secondary | ICD-10-CM | POA: Diagnosis not present

## 2015-04-06 DIAGNOSIS — Z90710 Acquired absence of both cervix and uterus: Secondary | ICD-10-CM | POA: Diagnosis not present

## 2015-04-06 DIAGNOSIS — N3289 Other specified disorders of bladder: Secondary | ICD-10-CM | POA: Diagnosis not present

## 2015-04-06 DIAGNOSIS — K219 Gastro-esophageal reflux disease without esophagitis: Secondary | ICD-10-CM | POA: Diagnosis not present

## 2015-04-06 DIAGNOSIS — E039 Hypothyroidism, unspecified: Secondary | ICD-10-CM | POA: Insufficient documentation

## 2015-04-06 DIAGNOSIS — Z7982 Long term (current) use of aspirin: Secondary | ICD-10-CM | POA: Diagnosis not present

## 2015-04-06 DIAGNOSIS — F419 Anxiety disorder, unspecified: Secondary | ICD-10-CM | POA: Insufficient documentation

## 2015-04-06 DIAGNOSIS — G8929 Other chronic pain: Secondary | ICD-10-CM | POA: Insufficient documentation

## 2015-04-06 DIAGNOSIS — N200 Calculus of kidney: Secondary | ICD-10-CM | POA: Diagnosis not present

## 2015-04-06 DIAGNOSIS — M1990 Unspecified osteoarthritis, unspecified site: Secondary | ICD-10-CM | POA: Insufficient documentation

## 2015-04-06 DIAGNOSIS — Z881 Allergy status to other antibiotic agents status: Secondary | ICD-10-CM | POA: Insufficient documentation

## 2015-04-06 DIAGNOSIS — F329 Major depressive disorder, single episode, unspecified: Secondary | ICD-10-CM | POA: Insufficient documentation

## 2015-04-06 DIAGNOSIS — R51 Headache: Secondary | ICD-10-CM | POA: Diagnosis not present

## 2015-04-06 DIAGNOSIS — Z8619 Personal history of other infectious and parasitic diseases: Secondary | ICD-10-CM | POA: Insufficient documentation

## 2015-04-06 DIAGNOSIS — E780 Pure hypercholesterolemia: Secondary | ICD-10-CM | POA: Diagnosis not present

## 2015-04-06 DIAGNOSIS — Z880 Allergy status to penicillin: Secondary | ICD-10-CM | POA: Diagnosis not present

## 2015-04-06 DIAGNOSIS — R312 Other microscopic hematuria: Secondary | ICD-10-CM | POA: Diagnosis not present

## 2015-04-06 DIAGNOSIS — E119 Type 2 diabetes mellitus without complications: Secondary | ICD-10-CM | POA: Diagnosis not present

## 2015-04-06 HISTORY — DX: Headache: R51

## 2015-04-06 HISTORY — DX: Major depressive disorder, single episode, unspecified: F32.9

## 2015-04-06 HISTORY — DX: Hypothyroidism, unspecified: E03.9

## 2015-04-06 HISTORY — DX: Unspecified osteoarthritis, unspecified site: M19.90

## 2015-04-06 HISTORY — DX: Gastro-esophageal reflux disease without esophagitis: K21.9

## 2015-04-06 HISTORY — DX: Other specified postprocedural states: R11.2

## 2015-04-06 HISTORY — PX: CYSTOSCOPY WITH URETEROSCOPY, STONE BASKETRY AND STENT PLACEMENT: SHX6378

## 2015-04-06 HISTORY — DX: Anxiety disorder, unspecified: F41.9

## 2015-04-06 HISTORY — DX: Pneumonia, unspecified organism: J18.9

## 2015-04-06 HISTORY — DX: Headache, unspecified: R51.9

## 2015-04-06 HISTORY — DX: Inflammatory liver disease, unspecified: K75.9

## 2015-04-06 HISTORY — DX: Depression, unspecified: F32.A

## 2015-04-06 HISTORY — PX: HOLMIUM LASER APPLICATION: SHX5852

## 2015-04-06 HISTORY — DX: Other specified postprocedural states: Z98.890

## 2015-04-06 HISTORY — DX: Type 2 diabetes mellitus without complications: E11.9

## 2015-04-06 LAB — GLUCOSE, CAPILLARY: Glucose-Capillary: 138 mg/dL — ABNORMAL HIGH (ref 65–99)

## 2015-04-06 SURGERY — CYSTOSCOPY, WITH CALCULUS MANIPULATION OR REMOVAL
Anesthesia: General | Laterality: Right

## 2015-04-06 MED ORDER — NORTRIPTYLINE HCL 25 MG PO CAPS
75.0000 mg | ORAL_CAPSULE | Freq: Every day | ORAL | Status: DC
  Administered 2015-04-06: 75 mg via ORAL
  Filled 2015-04-06 (×2): qty 3

## 2015-04-06 MED ORDER — PHENAZOPYRIDINE HCL 200 MG PO TABS: 200.0000 mg | ORAL_TABLET | Freq: Three times a day (TID) | ORAL | Status: DC | PRN

## 2015-04-06 MED ORDER — LEVOTHYROXINE SODIUM 100 MCG PO TABS
100.0000 ug | ORAL_TABLET | Freq: Every day | ORAL | Status: DC
  Administered 2015-04-06 – 2015-04-07 (×2): 100 ug via ORAL
  Filled 2015-04-06 (×2): qty 1

## 2015-04-06 MED ORDER — ONDANSETRON HCL 4 MG/2ML IJ SOLN
INTRAMUSCULAR | Status: DC | PRN
  Administered 2015-04-06: 4 mg via INTRAVENOUS

## 2015-04-06 MED ORDER — BISACODYL 10 MG RE SUPP: 10.0000 mg | Freq: Every day | RECTAL | Status: DC | PRN

## 2015-04-06 MED ORDER — DEXAMETHASONE SODIUM PHOSPHATE 10 MG/ML IJ SOLN
INTRAMUSCULAR | Status: AC
  Filled 2015-04-06: qty 1

## 2015-04-06 MED ORDER — DIAZEPAM 5 MG PO TABS: 5.0000 mg | ORAL_TABLET | Freq: Three times a day (TID) | ORAL | Status: DC | PRN

## 2015-04-06 MED ORDER — LIDOCAINE HCL (CARDIAC) 20 MG/ML IV SOLN
INTRAVENOUS | Status: DC | PRN
  Administered 2015-04-06: 30 mg via INTRAVENOUS

## 2015-04-06 MED ORDER — MIDAZOLAM HCL 5 MG/5ML IJ SOLN
INTRAMUSCULAR | Status: DC | PRN
  Administered 2015-04-06 (×2): 1 mg via INTRAVENOUS

## 2015-04-06 MED ORDER — DIPHENHYDRAMINE HCL 12.5 MG/5ML PO ELIX: 12.5000 mg | ORAL_SOLUTION | Freq: Four times a day (QID) | ORAL | Status: DC | PRN

## 2015-04-06 MED ORDER — ONDANSETRON HCL 4 MG/2ML IJ SOLN
INTRAMUSCULAR | Status: AC
  Filled 2015-04-06: qty 2

## 2015-04-06 MED ORDER — VERAPAMIL HCL ER 240 MG PO TBCR
240.0000 mg | EXTENDED_RELEASE_TABLET | Freq: Every day | ORAL | Status: DC
  Administered 2015-04-07: 240 mg via ORAL
  Filled 2015-04-06: qty 1

## 2015-04-06 MED ORDER — HYDROMORPHONE HCL 1 MG/ML IJ SOLN
INTRAMUSCULAR | Status: AC
  Administered 2015-04-06: 1 mg via INTRAVENOUS
  Filled 2015-04-06: qty 1

## 2015-04-06 MED ORDER — BUPROPION HCL ER (XL) 300 MG PO TB24
300.0000 mg | ORAL_TABLET | Freq: Every day | ORAL | Status: DC
  Administered 2015-04-06 – 2015-04-07 (×2): 300 mg via ORAL
  Filled 2015-04-06 (×2): qty 1

## 2015-04-06 MED ORDER — TOPIRAMATE 100 MG PO TABS
200.0000 mg | ORAL_TABLET | Freq: Every day | ORAL | Status: DC
  Administered 2015-04-06: 200 mg via ORAL
  Filled 2015-04-06 (×2): qty 2

## 2015-04-06 MED ORDER — HYDROCODONE-ACETAMINOPHEN 5-325 MG PO TABS
1.0000 | ORAL_TABLET | Freq: Four times a day (QID) | ORAL | Status: DC | PRN
Start: 2015-04-06 — End: 2015-04-07
  Administered 2015-04-06 – 2015-04-07 (×2): 1 via ORAL
  Filled 2015-04-06 (×2): qty 1

## 2015-04-06 MED ORDER — ACETAMINOPHEN 325 MG PO TABS
650.0000 mg | ORAL_TABLET | ORAL | Status: DC | PRN
  Administered 2015-04-06: 650 mg via ORAL
  Filled 2015-04-06: qty 2

## 2015-04-06 MED ORDER — LACTATED RINGERS IV SOLN: INTRAVENOUS | Status: DC

## 2015-04-06 MED ORDER — LEVOTHYROXINE SODIUM 25 MCG PO TABS
175.0000 ug | ORAL_TABLET | Freq: Every day | ORAL | Status: DC
  Administered 2015-04-06 – 2015-04-07 (×2): 175 ug via ORAL
  Filled 2015-04-06: qty 1
  Filled 2015-04-06 (×2): qty 3
  Filled 2015-04-06: qty 1

## 2015-04-06 MED ORDER — DEXAMETHASONE SODIUM PHOSPHATE 10 MG/ML IJ SOLN
INTRAMUSCULAR | Status: DC | PRN
  Administered 2015-04-06: 10 mg via INTRAVENOUS

## 2015-04-06 MED ORDER — LACTATED RINGERS IV SOLN
INTRAVENOUS | Status: DC | PRN
  Administered 2015-04-06: 10:00:00 via INTRAVENOUS

## 2015-04-06 MED ORDER — HYDROCODONE-ACETAMINOPHEN 5-325 MG PO TABS
ORAL_TABLET | ORAL | Status: AC
  Filled 2015-04-06: qty 1

## 2015-04-06 MED ORDER — OXYBUTYNIN CHLORIDE 5 MG PO TABS: 5.0000 mg | ORAL_TABLET | Freq: Three times a day (TID) | ORAL | Status: DC | PRN

## 2015-04-06 MED ORDER — IOHEXOL 300 MG/ML  SOLN
INTRAMUSCULAR | Status: DC | PRN
  Administered 2015-04-06: 5 mL

## 2015-04-06 MED ORDER — PROPOFOL 10 MG/ML IV BOLUS
INTRAVENOUS | Status: DC | PRN
  Administered 2015-04-06: 130 mg via INTRAVENOUS

## 2015-04-06 MED ORDER — DIPHENHYDRAMINE HCL 50 MG/ML IJ SOLN: 12.5000 mg | Freq: Four times a day (QID) | INTRAMUSCULAR | Status: DC | PRN

## 2015-04-06 MED ORDER — FENTANYL CITRATE (PF) 100 MCG/2ML IJ SOLN
INTRAMUSCULAR | Status: DC | PRN
  Administered 2015-04-06 (×2): 50 ug via INTRAVENOUS

## 2015-04-06 MED ORDER — HYDROCODONE-ACETAMINOPHEN 5-325 MG PO TABS
1.0000 | ORAL_TABLET | Freq: Once | ORAL | Status: AC
  Administered 2015-04-06: 1 via ORAL

## 2015-04-06 MED ORDER — SODIUM CHLORIDE 0.9 % IR SOLN
Status: DC | PRN
  Administered 2015-04-06: 6000 mL via INTRAVESICAL

## 2015-04-06 MED ORDER — HYDROCODONE-ACETAMINOPHEN 5-325 MG PO TABS: 1.0000 | ORAL_TABLET | Freq: Four times a day (QID) | ORAL | Status: DC | PRN

## 2015-04-06 MED ORDER — HYDROMORPHONE HCL 1 MG/ML IJ SOLN
0.5000 mg | INTRAMUSCULAR | Status: DC | PRN
  Administered 2015-04-06 (×2): 1 mg via INTRAVENOUS
  Administered 2015-04-06: 0.5 mg via INTRAVENOUS
  Administered 2015-04-07: 1 mg via INTRAVENOUS
  Filled 2015-04-06 (×4): qty 1

## 2015-04-06 MED ORDER — POTASSIUM CHLORIDE CRYS ER 20 MEQ PO TBCR
40.0000 meq | EXTENDED_RELEASE_TABLET | Freq: Two times a day (BID) | ORAL | Status: DC
  Administered 2015-04-06 – 2015-04-07 (×2): 40 meq via ORAL
  Filled 2015-04-06 (×2): qty 2

## 2015-04-06 MED ORDER — ONDANSETRON HCL 4 MG/2ML IJ SOLN
4.0000 mg | INTRAMUSCULAR | Status: DC | PRN
Start: 2015-04-06 — End: 2015-04-07
  Administered 2015-04-06 – 2015-04-07 (×2): 4 mg via INTRAVENOUS
  Filled 2015-04-06 (×2): qty 2

## 2015-04-06 MED ORDER — KCL IN DEXTROSE-NACL 20-5-0.45 MEQ/L-%-% IV SOLN
INTRAVENOUS | Status: DC
  Filled 2015-04-06: qty 1000

## 2015-04-06 MED ORDER — FENTANYL CITRATE (PF) 100 MCG/2ML IJ SOLN
INTRAMUSCULAR | Status: AC
  Filled 2015-04-06: qty 2

## 2015-04-06 MED ORDER — PROPOFOL 10 MG/ML IV BOLUS
INTRAVENOUS | Status: AC
  Filled 2015-04-06: qty 20

## 2015-04-06 MED ORDER — CYCLOBENZAPRINE HCL 10 MG PO TABS: 10.0000 mg | ORAL_TABLET | Freq: Three times a day (TID) | ORAL | Status: DC | PRN

## 2015-04-06 MED ORDER — HYDROMORPHONE HCL 1 MG/ML IJ SOLN
0.2500 mg | INTRAMUSCULAR | Status: DC | PRN
  Administered 2015-04-06: 0.25 mg via INTRAVENOUS

## 2015-04-06 MED ORDER — SCOPOLAMINE 1 MG/3DAYS TD PT72
1.0000 | MEDICATED_PATCH | Freq: Once | TRANSDERMAL | Status: AC
  Administered 2015-04-06: 1 via TRANSDERMAL
  Filled 2015-04-06: qty 1

## 2015-04-06 MED ORDER — MIDAZOLAM HCL 2 MG/2ML IJ SOLN
INTRAMUSCULAR | Status: AC
  Filled 2015-04-06: qty 2

## 2015-04-06 SURGICAL SUPPLY — 26 items
BAG URO CATCHER STRL LF (DRAPE) ×3 IMPLANT
BASKET LASER NITINOL 1.9FR (BASKET) ×2 IMPLANT
BASKET STONE NCOMPASS (UROLOGICAL SUPPLIES) IMPLANT
BSKT STON RTRVL 120 1.9FR (BASKET) ×1
CATH URET 5FR 28IN OPEN ENDED (CATHETERS) ×2 IMPLANT
CLOTH BEACON ORANGE TIMEOUT ST (SAFETY) ×3 IMPLANT
EXTRACTOR STONE NITINOL NGAGE (UROLOGICAL SUPPLIES) ×2 IMPLANT
FIBER LASER FLEXIVA 1000 (UROLOGICAL SUPPLIES) IMPLANT
FIBER LASER FLEXIVA 200 (UROLOGICAL SUPPLIES) IMPLANT
FIBER LASER FLEXIVA 365 (UROLOGICAL SUPPLIES) IMPLANT
FIBER LASER FLEXIVA 550 (UROLOGICAL SUPPLIES) IMPLANT
FIBER LASER TRAC TIP (UROLOGICAL SUPPLIES) ×2 IMPLANT
GLOVE SURG SS PI 8.0 STRL IVOR (GLOVE) IMPLANT
GOWN STRL REUS W/TWL XL LVL3 (GOWN DISPOSABLE) ×3 IMPLANT
GUIDEWIRE STR DUAL SENSOR (WIRE) ×3 IMPLANT
IV NS 1000ML (IV SOLUTION) ×3
IV NS 1000ML BAXH (IV SOLUTION) ×1 IMPLANT
IV NS IRRIG 3000ML ARTHROMATIC (IV SOLUTION) ×3 IMPLANT
MANIFOLD NEPTUNE II (INSTRUMENTS) ×3 IMPLANT
PACK CYSTO (CUSTOM PROCEDURE TRAY) ×3 IMPLANT
SET IRRIG Y TYPE TUR BLADDER L (SET/KITS/TRAYS/PACK) ×2 IMPLANT
SHEATH ACCESS URETERAL 38CM (SHEATH) ×5 IMPLANT
SHEATH URET ACCESS 10/12FR (MISCELLANEOUS) IMPLANT
STENT URET 6FRX24 CONTOUR (STENTS) ×2 IMPLANT
TUBING CONNECTING 10 (TUBING) ×2 IMPLANT
TUBING CONNECTING 10' (TUBING) ×1

## 2015-04-06 NOTE — Anesthesia Postprocedure Evaluation (Signed)
  Anesthesia Post-op Note  Patient: Courtney Orozco  Procedure(s) Performed: Procedure(s): CYSTOSCOPY WITH RIGHT URETEROSCOPY, STONE BASKETRY AND STENT PLACEMENT (Right) HOLMIUM LASER APPLICATION (Right)  Patient Location: PACU  Anesthesia Type:General  Level of Consciousness: awake and alert   Airway and Oxygen Therapy: Patient Spontanous Breathing  Post-op Pain: moderate  Post-op Assessment: Post-op Vital signs reviewed, Patient's Cardiovascular Status Stable and Respiratory Function Stable  Post-op Vital Signs: Reviewed  Filed Vitals:   04/06/15 1315  BP: 104/83  Pulse: 88  Temp:   Resp: 20    Complications: No apparent anesthesia complications

## 2015-04-06 NOTE — Transfer of Care (Signed)
Immediate Anesthesia Transfer of Care Note  Patient: Courtney Orozco  Procedure(s) Performed: Procedure(s): CYSTOSCOPY WITH RIGHT URETEROSCOPY, STONE BASKETRY AND STENT PLACEMENT (Right) HOLMIUM LASER APPLICATION (Right)  Patient Location: PACU  Anesthesia Type:General  Level of Consciousness:  sedated, patient cooperative and responds to stimulation  Airway & Oxygen Therapy:Patient Spontanous Breathing and Patient connected to face mask oxgen  Post-op Assessment:  Report given to PACU RN and Post -op Vital signs reviewed and stable  Post vital signs:  Reviewed and stable  Last Vitals:  Filed Vitals:   04/06/15 0737  BP: 137/61  Pulse: 94  Temp: 36.7 C  Resp: 18    Complications: No apparent anesthesia complications

## 2015-04-06 NOTE — Anesthesia Preprocedure Evaluation (Addendum)
Anesthesia Evaluation  Patient identified by MRN, date of birth, ID band Patient awake    Reviewed: Allergy & Precautions, H&P , NPO status , Patient's Chart, lab work & pertinent test results  History of Anesthesia Complications (+) history of anesthetic complications  Airway Mallampati: II  TM Distance: >3 FB Neck ROM: Full    Dental no notable dental hx. (+) Edentulous Upper, Edentulous Lower, Dental Advisory Given   Pulmonary neg pulmonary ROS, former smoker,  breath sounds clear to auscultation  Pulmonary exam normal       Cardiovascular hypertension, Pt. on medications Rhythm:Regular Rate:Normal     Neuro/Psych  Headaches, Anxiety Depression    GI/Hepatic Neg liver ROS, GERD-  Medicated and Controlled,  Endo/Other  diabetesHypothyroidism   Renal/GU negative Renal ROS  negative genitourinary   Musculoskeletal  (+) Arthritis -, Osteoarthritis,    Abdominal   Peds  Hematology negative hematology ROS (+)   Anesthesia Other Findings   Reproductive/Obstetrics negative OB ROS                            Anesthesia Physical Anesthesia Plan  ASA: II  Anesthesia Plan: General   Post-op Pain Management:    Induction: Intravenous  Airway Management Planned: LMA  Additional Equipment:   Intra-op Plan:   Post-operative Plan: Extubation in OR  Informed Consent: I have reviewed the patients History and Physical, chart, labs and discussed the procedure including the risks, benefits and alternatives for the proposed anesthesia with the patient or authorized representative who has indicated his/her understanding and acceptance.   Dental advisory given  Plan Discussed with: CRNA  Anesthesia Plan Comments:         Anesthesia Quick Evaluation

## 2015-04-06 NOTE — Progress Notes (Signed)
Received patient via wheel chair from PACU. Pt s/p cystoscopy with right ureteroscopy, stent placement and stone removal. Pt was to be discharge from PACU but was unable to maintain adequate oxygen saturation. Per report pt oxygen was in the low 90's and was placed on 3 L [place]. On arrival to floor, pt was awake and alert, complained of nausea, headache, back and leg pain. Pt taken off oxygen to assess saturation. Pt remained 93% on room air while at rest. Pt assisted to bed, Dr. Baltazar Najjar of urology came and evaluated patient with plans on discharge home tomorrow.

## 2015-04-06 NOTE — Interval H&P Note (Signed)
History and Physical Interval Note:  04/06/2015 9:55 AM  Courtney Orozco  has presented today for surgery, with the diagnosis of RIGHT RENAL PELVIC STONE  The various methods of treatment have been discussed with the patient and family. After consideration of risks, benefits and other options for treatment, the patient has consented to  Procedure(s): CYSTOSCOPY WITH RIGHT URETEROSCOPY, STONE BASKETRY AND POSSIBLE STENT PLACEMENT (Right) HOLMIUM LASER APPLICATION (Right) as a surgical intervention .  The patient's history has been reviewed, patient examined, no change in status, stable for surgery.  I have reviewed the patient's chart and labs.  Questions were answered to the patient's satisfaction.     Jaimee Corum J

## 2015-04-06 NOTE — Progress Notes (Signed)
Patient still complains of headache despite dilaudid and tylenol. Will re medicate and re evaluate.

## 2015-04-06 NOTE — Discharge Instructions (Signed)
CYSTOSCOPY HOME CARE INSTRUCTIONS  Activity: Rest for the remainder of the day.  Do not drive or operate equipment today.  You may resume normal activities in one to two days as instructed by your physician.   Meals: Drink plenty of liquids and eat light foods such as gelatin or soup this evening.  You may return to a normal meal plan tomorrow.  Return to Work: You may return to work in one to two days or as instructed by your physician.  Special Instructions / Symptoms: Call your physician if any of these symptoms occur:   -persistent or heavy bleeding  -bleeding which continues after first few urination  -large blood clots that are difficult to pass  -urine stream diminishes or stops completely  -fever equal to or higher than 101 degrees Farenheit.  -cloudy urine with a strong, foul odor  -severe pain  Females should always wipe from front to back after elimination.  You may feel some burning pain when you urinate.  This should disappear with time.  Applying moist heat to the lower abdomen or a hot tub bath may help relieve the pain. \  Please bring your stones to the office for analysis.    Patient Signature:  ________________________________________________________  Nurse's Signature:  ________________________________________________________

## 2015-04-06 NOTE — Brief Op Note (Signed)
04/06/2015  11:06 AM  PATIENT:  Gillermina Hu  64 y.o. female  PRE-OPERATIVE DIAGNOSIS:  RIGHT RENAL PELVIC STONE  POST-OPERATIVE DIAGNOSIS:  RIGHT RENAL PELVIC STONE  PROCEDURE:  Procedure(s): CYSTOSCOPY WITH RIGHT URETEROSCOPY, STONE BASKETRY AND POSSIBLE STENT PLACEMENT (Right) HOLMIUM LASER APPLICATION (Right)  SURGEON:  Surgeon(s) and Role:    * Irine Seal, MD - Primary  PHYSICIAN ASSISTANT:   ASSISTANTS: none   ANESTHESIA:   general  EBL:     BLOOD ADMINISTERED:none  DRAINS: 6 x 24 right JJ stent   LOCAL MEDICATIONS USED:  NONE  SPECIMEN:  Source of Specimen:  stone fragments  DISPOSITION OF SPECIMEN:  to patient to bring to office  COUNTS:  YES  TOURNIQUET:  * No tourniquets in log *  DICTATION: .Other Dictation: Dictation Number [id number]  PLAN OF CARE: Discharge to home after PACU  PATIENT DISPOSITION:  PACU - hemodynamically stable.   Delay start of Pharmacological VTE agent (>24hrs) due to surgical blood loss or risk of bleeding: not applicable

## 2015-04-06 NOTE — Anesthesia Procedure Notes (Signed)
Procedure Name: LMA Insertion Date/Time: 04/06/2015 10:08 AM Performed by: Lajuana Carry E Pre-anesthesia Checklist: Patient identified, Emergency Drugs available, Suction available and Patient being monitored Patient Re-evaluated:Patient Re-evaluated prior to inductionOxygen Delivery Method: Circle system utilized Preoxygenation: Pre-oxygenation with 100% oxygen Intubation Type: IV induction Ventilation: Mask ventilation without difficulty LMA: LMA inserted LMA Size: 4.0 Number of attempts: 1 Placement Confirmation: positive ETCO2 and breath sounds checked- equal and bilateral Dental Injury: Teeth and Oropharynx as per pre-operative assessment

## 2015-04-07 ENCOUNTER — Encounter (HOSPITAL_COMMUNITY): Payer: Self-pay | Admitting: Urology

## 2015-04-07 DIAGNOSIS — N200 Calculus of kidney: Secondary | ICD-10-CM | POA: Diagnosis not present

## 2015-04-07 NOTE — Op Note (Signed)
NAMEKEYLEE, SHRESTHA                 ACCOUNT NO.:  [id number]  MEDICAL RECORD NO.:  [id number]  LOCATION:  5329                         FACILITY:  [facility]  PHYSICIAN:  Marshall Cork. Jeffie Pollock, M.D.    DATE OF BIRTH:  October 10, 1951  DATE OF PROCEDURE:  04/06/2015 DATE OF DISCHARGE:                              OPERATIVE REPORT   PROCEDURES: 1. Cystoscopy with right retrograde pyelogram and interpretation. 2. Right ureteroscopy with holmium lasertripsy and stone extraction. 3. Insertion of right double-J stent.  PREOPERATIVE DIAGNOSIS:  Right renal pelvic stone.  POSTOPERATIVE DIAGNOSIS:  Right renal pelvic stone.  SURGEON:  Marshall Cork. Jeffie Pollock, M.D.  ANESTHESIA:  General.  SPECIMEN:  Stone fragments.  DRAINS:  A 6-French 24 cm double-J stent.  BLOOD LOSS:  Minimal.  COMPLICATIONS:  None.  INDICATIONS:  Ms. Butrick is a 64 year old white female, who was found to have approximately 8 mm stone in the right renal pelvis.  It was felt to possibly be causing some infundibular obstruction as well.  It was felt that ureteroscopy was indicated consistent with poorly visualized on plain film.  FINDINGS OF PROCEDURE:  She was given Cipro.  She was taken to the operating room where general anesthetic was induced.  She was placed in lithotomy position.  Her perineum and genitalia were prepped with Betadine solution.  She was draped in usual sterile fashion.  Cystoscopy was performed using a 22-French scope and 30-degree lens. Examination revealed a normal urethra.  She had a cystocele, so the ureters were angled down into the pelvis, but were otherwise unremarkable.  The bladder wall had mild trabeculation.  No tumors, stones, or inflammation.  The right ureteral orifice was cannulated with 5-French open-end catheter and contrast was instilled.  This demonstrated a normal ureter from the bladder to the UPJ.  There was a mild UPJ obstruction or filling defect in the middle of the pelvis consistent with  the stone. The remainder of the collecting system was delicate.  A guidewire was then passed through the open-end catheter in the kidney and the cystoscope was removed.  A 12-French introducer sheath inner core 38 cm in length was then passed easily over the wire to the kidney to dilate the ureter.  The sheath was then reassembled and inserted to the level of the kidney.  The inner core and wire were then removed.  The dual-lumen with digital flexible scope was then passed through the sheath to the kidney and the stone was identified.  A 200 micron tract-tip laser fiber was then advanced.  It was initially set on 0.5 watts and 10 Hz, but this was increased to 1 watt after the initial shocks of the stone appeared to be somewhat durable.  The stone was then fragmented into several fragments, but she had significant respiratory excursion and during one renal movement while active laser was going on, the tip of the laser touched the mucosa and there was onset of bleeding.  At this point, I added a second irrigation tubing to the smaller port and had 2 pressure bags to help increase flow.  With this, I was eventually able to flush the kidney sufficiently to see the fragments.  They were grasped with NGage basket and removed some of them and required additional lasering during removal, but that was made simple with the dual-lumen scope.  Once all significant fragments were removed, the bleeding seemed to have stopped.  The scope was removed and the guidewire was reinserted to the kidney.  The sheath was then removed.  The cystoscope was reinserted over the wire and a 6-French 24 cm double-J stent with out-string was then inserted to the kidney under fluoroscopic guidance.  The wire was removed leaving good coil in the kidney and good coil in the bladder. Final fluoroscopic imaging demonstrated possibly 1 small residual fragment, smaller than the diameter of the stent.  At this point,  the bladder was drained.  The cystoscope was removed. The patient was taken down from lithotomy position.  Her anesthetic was reversed.  She was moved to recovery room in stable condition.  There were no complications.     Marshall Cork. Jeffie Pollock, M.D.     JJW/MEDQ  D:  04/06/2015  T:  04/07/2015  Job:  [id number]

## 2015-04-07 NOTE — Discharge Summary (Signed)
Physician Discharge Summary  Patient ID: RHEMI BALBACH MRN: [id number] DOB/AGE: [birth year] 64 y.o.  Admit date: 04/06/2015 Discharge date: 04/07/2015  Admission Diagnoses:  Renal calculus, right  Discharge Diagnoses:  Principal Problem:   Renal calculus, right   Past Medical History  Diagnosis Date  . Back pain   . Hypertension   . Thyroid disease   . PONV (postoperative nausea and vomiting)   . Hypothyroidism   . Pneumonia     hx of 01/2015   . Anxiety   . Depression   . Diabetes mellitus without complication     no meds in 5 years   . GERD (gastroesophageal reflux disease)   . Headache     hx of migraines   . Arthritis   . Hepatitis     hx of hep B - 44 years ago     Surgeries: Procedure(s): CYSTOSCOPY WITH RIGHT URETEROSCOPY, STONE BASKETRY AND STENT PLACEMENT HOLMIUM LASER APPLICATION on 01/05/4007   Consultants (if any):    Discharged Condition: Improved  Hospital Course: SHACOYA BURKHAMMER is an 64 y.o. female who was admitted 04/06/2015 with a diagnosis of Renal calculus, right and went to the operating room on 04/06/2015 and underwent the above named procedures.  She required O2 support post op and was admitted for observation.  She is doing well this morning with normal O2 levels on RA.  She has no pain.   She will be discharged.   She was given perioperative antibiotics:      Anti-infectives    Start     Dose/Rate Route Frequency Ordered Stop   04/05/15 1424  gentamicin (GARAMYCIN) 480 mg in dextrose 5 % 100 mL IVPB     5 mg/kg  95.3 kg 112 mL/hr over 60 Minutes Intravenous 30 min pre-op 04/05/15 1424 04/06/15 1030    .  She was given sequential compression devices for DVT prophylaxis.  She benefited maximally from the hospital stay and there were no complications.    Recent vital signs:  Filed Vitals:   04/07/15 0532  BP: 124/59  Pulse: 98  Temp: 98.3 F (36.8 C)  Resp: 18    Recent laboratory studies:  Lab Results  Component Value Date    HGB 13.4 03/14/2015   Lab Results  Component Value Date   WBC 8.9 03/14/2015   PLT 274 03/14/2015   No results found for: INR Lab Results  Component Value Date   NA 138 03/14/2015   K 3.9 03/14/2015   CL 110 03/14/2015   CO2 22 03/14/2015   BUN 12 03/14/2015   CREATININE 0.94 03/14/2015   GLUCOSE 120* 03/14/2015    Discharge Medications:     Medication List    TAKE these medications        aspirin 325 MG tablet  Take 325 mg by mouth daily.     buPROPion 300 MG 24 hr tablet  Commonly known as:  WELLBUTRIN XL  Take 300 mg by mouth daily.     clotrimazole 1 % cream  Commonly known as:  LOTRIMIN  Apply 1 application topically 2 (two) times daily.     CRANBERRY PO  Take 1 tablet by mouth daily.     cyclobenzaprine 10 MG tablet  Commonly known as:  FLEXERIL  Take 10 mg by mouth 3 (three) times daily as needed for muscle spasms.     diazepam 5 MG tablet  Commonly known as:  VALIUM  Take 5 mg by mouth 3 (three) times  daily as needed for anxiety.     doxycycline 100 MG capsule  Commonly known as:  VIBRAMYCIN  Take 1 capsule (100 mg total) by mouth 2 (two) times daily.     HYDROcodone-acetaminophen 5-325 MG per tablet  Commonly known as:  NORCO/VICODIN  Take 1 tablet by mouth every 6 (six) hours as needed for moderate pain.     KLOR-CON M20 20 MEQ tablet  Generic drug:  potassium chloride SA  Take 40 mEq by mouth 2 (two) times daily.     levofloxacin 750 MG tablet  Commonly known as:  LEVAQUIN  Take 1 tablet (750 mg total) by mouth daily.     levothyroxine 100 MCG tablet  Commonly known as:  SYNTHROID, LEVOTHROID  Take 100 mcg by mouth daily. Take with 123mcg tablet for total dose of 221mcg daily     levothyroxine 175 MCG tablet  Commonly known as:  SYNTHROID, LEVOTHROID  Take 175 mcg by mouth daily. Take one tablet with 145mcg tablet for total dose of 280mcg     multivitamin with minerals Tabs tablet  Take 1 tablet by mouth daily.     nortriptyline 25  MG capsule  Commonly known as:  PAMELOR  Take 75 mg by mouth at bedtime.     phenazopyridine 200 MG tablet  Commonly known as:  PYRIDIUM  Take 1 tablet (200 mg total) by mouth 3 (three) times daily as needed for pain.     PROBIOTIC DAILY PO  Take 1 tablet by mouth daily.     temazepam 30 MG capsule  Commonly known as:  RESTORIL  Take 30 mg by mouth at bedtime as needed for sleep.     topiramate 200 MG tablet  Commonly known as:  TOPAMAX  Take 200 mg by mouth at bedtime.     verapamil 240 MG CR tablet  Commonly known as:  CALAN-SR  Take 240 mg by mouth daily.        Diagnostic Studies: Dg Chest 2 View  03/14/2015   CLINICAL DATA:  Cough for the past week.  EXAM: CHEST  2 VIEW  COMPARISON:  None.  FINDINGS: Normal sized heart. Right diaphragmatic eventration with underlying gas distended hepatic flexure. Small amount of linear and patchy density in the left lower lobe. Diffuse osteopenia.  IMPRESSION: 1. Mild left lower lobe atelectasis and possible pneumonia. 2. Gas distended colon interposition beneath a large right diaphragmatic eventration.   Electronically Signed   By: Claudie Revering M.D.   On: 03/14/2015 11:49   Ct Chest Wo Contrast  03/14/2015   CLINICAL DATA:  Productive cough with brown sputum. Abdominal pain. History of fall 1 week ago.  EXAM: CT CHEST WITHOUT CONTRAST  CT ABDOMEN AND PELVIS WITH CONTRAST  TECHNIQUE: Multidetector CT imaging of the chest was performed following the standard protocol without IV contrast. CT imaging of the abdomen and pelvis was performed following the administration of intravenous contrast.  CONTRAST:  100 cubic cm Omnipaque 300 IV  COMPARISON:  None.  FINDINGS: CT CHEST FINDINGS  Mediastinum/Nodes: Heart is normal size. Aortic calcifications. No aneurysm. No mediastinal, hilar, or axillary adenopathy.  Lungs/Pleura: Airspace opacities are noted in both lower lobes, left slightly greater than right concerning for pneumonia. Remainder of the lungs  are clear. No pleural effusions.  Chest wall: Chest wall soft tissues are unremarkable.  Musculoskeletal: No acute bony abnormality or focal bone lesion.  CT ABDOMEN PELVIS FINDINGS  Hepatobiliary: No biliary ductal dilatation or focal hepatic lesion. Gallbladder  grossly unremarkable.  Pancreas: No focal abnormality or ductal dilatation.  Spleen: No focal abnormality.  Normal size.  Adrenals/Urinary Tract: 8-9 mm stone in the right renal pelvis. Slight fullness of the right renal pelvis with apparent mild wall enhancement. No ureteral dilatation or ureteral stone. Urinary bladder is unremarkable. Left kidney unremarkable.  Stomach/Bowel: Stomach, large and small bowel grossly unremarkable.  Vascular/Lymphatic: Go  Reproductive: No mass or other significant abnormality. Prior hysterectomy.  Other: No free fluid or free air.  Musculoskeletal: No focal bone lesion or acute bony abnormality.  IMPRESSION: Bilateral lower lobe airspace opacities, left slightly greater than right concerning for multifocal pneumonia.  8-9 mm right renal pelvic stone. There is mild wall thickening and enhancement of the renal pelvic wall. Cannot exclude infection. Recommend clinical correlation for evidence of UTI.   Electronically Signed   By: Rolm Baptise M.D.   On: 03/14/2015 14:50   Ct Abdomen Pelvis W Contrast  03/14/2015   CLINICAL DATA:  Productive cough with brown sputum. Abdominal pain. History of fall 1 week ago.  EXAM: CT CHEST WITHOUT CONTRAST  CT ABDOMEN AND PELVIS WITH CONTRAST  TECHNIQUE: Multidetector CT imaging of the chest was performed following the standard protocol without IV contrast. CT imaging of the abdomen and pelvis was performed following the administration of intravenous contrast.  CONTRAST:  100 cubic cm Omnipaque 300 IV  COMPARISON:  None.  FINDINGS: CT CHEST FINDINGS  Mediastinum/Nodes: Heart is normal size. Aortic calcifications. No aneurysm. No mediastinal, hilar, or axillary adenopathy.  Lungs/Pleura:  Airspace opacities are noted in both lower lobes, left slightly greater than right concerning for pneumonia. Remainder of the lungs are clear. No pleural effusions.  Chest wall: Chest wall soft tissues are unremarkable.  Musculoskeletal: No acute bony abnormality or focal bone lesion.  CT ABDOMEN PELVIS FINDINGS  Hepatobiliary: No biliary ductal dilatation or focal hepatic lesion. Gallbladder grossly unremarkable.  Pancreas: No focal abnormality or ductal dilatation.  Spleen: No focal abnormality.  Normal size.  Adrenals/Urinary Tract: 8-9 mm stone in the right renal pelvis. Slight fullness of the right renal pelvis with apparent mild wall enhancement. No ureteral dilatation or ureteral stone. Urinary bladder is unremarkable. Left kidney unremarkable.  Stomach/Bowel: Stomach, large and small bowel grossly unremarkable.  Vascular/Lymphatic: Go  Reproductive: No mass or other significant abnormality. Prior hysterectomy.  Other: No free fluid or free air.  Musculoskeletal: No focal bone lesion or acute bony abnormality.  IMPRESSION: Bilateral lower lobe airspace opacities, left slightly greater than right concerning for multifocal pneumonia.  8-9 mm right renal pelvic stone. There is mild wall thickening and enhancement of the renal pelvic wall. Cannot exclude infection. Recommend clinical correlation for evidence of UTI.   Electronically Signed   By: Rolm Baptise M.D.   On: 03/14/2015 14:50   Dg Chest Port 1 View  03/14/2015   CLINICAL DATA:  Cough for the past week.  EXAM: PORTABLE CHEST - 1 VIEW  COMPARISON:  Earlier today.  FINDINGS: The current image was obtained to decrease the exposure technique. This results in an underexposed image with poor visualization of the left lung base. The remainder of the lungs are clear. The other previously described findings are unchanged.  IMPRESSION: Underexposed image with no additional information provided. Please see the previous report.   Electronically Signed   By:  Claudie Revering M.D.   On: 03/14/2015 12:22    Disposition: 01-Home or Self Care    Follow-up Information    Follow up with  Malka So, MD On 04/23/2015.   Specialty:  Urology   Contact information:   [address] [place] [address] [phone]        Signed: Malka So 04/07/2015, 8:14 AM

## 2015-04-23 ENCOUNTER — Ambulatory Visit (INDEPENDENT_AMBULATORY_CARE_PROVIDER_SITE_OTHER): Payer: PPO | Admitting: Urology

## 2015-04-23 ENCOUNTER — Other Ambulatory Visit: Payer: Self-pay | Admitting: Urology

## 2015-04-23 DIAGNOSIS — N200 Calculus of kidney: Secondary | ICD-10-CM

## 2015-05-26 ENCOUNTER — Ambulatory Visit (HOSPITAL_COMMUNITY)
Admission: RE | Admit: 2015-05-26 | Discharge: 2015-05-26 | Disposition: A | Discharge status: Home / Self Care | Payer: PPO | Source: Ambulatory Visit | Attending: Urology | Admitting: Urology

## 2015-05-26 DIAGNOSIS — N200 Calculus of kidney: Secondary | ICD-10-CM | POA: Diagnosis not present

## 2015-05-26 DIAGNOSIS — N1330 Unspecified hydronephrosis: Secondary | ICD-10-CM | POA: Insufficient documentation

## 2015-06-08 ENCOUNTER — Other Ambulatory Visit: Payer: Self-pay | Admitting: Urology

## 2015-06-08 ENCOUNTER — Ambulatory Visit (HOSPITAL_COMMUNITY)
Admission: RE | Admit: 2015-06-08 | Discharge: 2015-06-08 | Disposition: A | Discharge status: Home / Self Care | Payer: PPO | Source: Ambulatory Visit | Attending: Urology | Admitting: Urology

## 2015-06-08 DIAGNOSIS — N200 Calculus of kidney: Secondary | ICD-10-CM | POA: Diagnosis present

## 2015-06-11 ENCOUNTER — Ambulatory Visit (INDEPENDENT_AMBULATORY_CARE_PROVIDER_SITE_OTHER): Payer: PPO | Admitting: Urology

## 2015-06-11 DIAGNOSIS — N200 Calculus of kidney: Secondary | ICD-10-CM

## 2015-09-03 ENCOUNTER — Ambulatory Visit (HOSPITAL_COMMUNITY)
Admission: RE | Admit: 2015-09-03 | Discharge: 2015-09-03 | Disposition: A | Discharge status: Home / Self Care | Payer: PPO | Source: Ambulatory Visit | Attending: Urology | Admitting: Urology

## 2015-09-03 ENCOUNTER — Other Ambulatory Visit: Payer: Self-pay | Admitting: Urology

## 2015-09-03 ENCOUNTER — Ambulatory Visit (INDEPENDENT_AMBULATORY_CARE_PROVIDER_SITE_OTHER): Payer: PPO | Admitting: Urology

## 2015-09-03 DIAGNOSIS — N390 Urinary tract infection, site not specified: Secondary | ICD-10-CM | POA: Diagnosis not present

## 2015-09-03 DIAGNOSIS — N200 Calculus of kidney: Secondary | ICD-10-CM

## 2015-10-22 DIAGNOSIS — N183 Chronic kidney disease, stage 3 unspecified: Secondary | ICD-10-CM | POA: Insufficient documentation

## 2015-10-29 DIAGNOSIS — R001 Bradycardia, unspecified: Secondary | ICD-10-CM | POA: Diagnosis not present

## 2015-10-29 DIAGNOSIS — M5126 Other intervertebral disc displacement, lumbar region: Secondary | ICD-10-CM | POA: Diagnosis not present

## 2015-10-29 DIAGNOSIS — M4806 Spinal stenosis, lumbar region: Secondary | ICD-10-CM | POA: Diagnosis not present

## 2015-10-30 DIAGNOSIS — R001 Bradycardia, unspecified: Secondary | ICD-10-CM | POA: Diagnosis not present

## 2015-12-13 DIAGNOSIS — L859 Epidermal thickening, unspecified: Secondary | ICD-10-CM | POA: Diagnosis not present

## 2015-12-13 DIAGNOSIS — D485 Neoplasm of uncertain behavior of skin: Secondary | ICD-10-CM | POA: Diagnosis not present

## 2015-12-31 ENCOUNTER — Ambulatory Visit (INDEPENDENT_AMBULATORY_CARE_PROVIDER_SITE_OTHER): Payer: PPO | Admitting: Urology

## 2015-12-31 DIAGNOSIS — R600 Localized edema: Secondary | ICD-10-CM

## 2015-12-31 DIAGNOSIS — N200 Calculus of kidney: Secondary | ICD-10-CM | POA: Diagnosis not present

## 2015-12-31 DIAGNOSIS — N390 Urinary tract infection, site not specified: Secondary | ICD-10-CM | POA: Diagnosis not present

## 2016-01-20 DIAGNOSIS — Z79899 Other long term (current) drug therapy: Secondary | ICD-10-CM | POA: Diagnosis not present

## 2016-01-24 DIAGNOSIS — L570 Actinic keratosis: Secondary | ICD-10-CM | POA: Diagnosis not present

## 2016-01-24 DIAGNOSIS — L0100 Impetigo, unspecified: Secondary | ICD-10-CM | POA: Diagnosis not present

## 2016-02-09 DIAGNOSIS — L0100 Impetigo, unspecified: Secondary | ICD-10-CM | POA: Diagnosis not present

## 2016-02-11 DIAGNOSIS — M545 Low back pain: Secondary | ICD-10-CM | POA: Diagnosis not present

## 2016-02-11 DIAGNOSIS — N183 Chronic kidney disease, stage 3 (moderate): Secondary | ICD-10-CM | POA: Diagnosis not present

## 2016-02-11 DIAGNOSIS — K52838 Other microscopic colitis: Secondary | ICD-10-CM | POA: Diagnosis not present

## 2016-02-11 DIAGNOSIS — G8929 Other chronic pain: Secondary | ICD-10-CM | POA: Diagnosis not present

## 2016-02-11 DIAGNOSIS — E039 Hypothyroidism, unspecified: Secondary | ICD-10-CM | POA: Diagnosis not present

## 2016-02-11 DIAGNOSIS — Z79899 Other long term (current) drug therapy: Secondary | ICD-10-CM | POA: Diagnosis not present

## 2016-02-11 DIAGNOSIS — I10 Essential (primary) hypertension: Secondary | ICD-10-CM | POA: Diagnosis not present

## 2016-02-11 DIAGNOSIS — R7301 Impaired fasting glucose: Secondary | ICD-10-CM | POA: Diagnosis not present

## 2016-02-11 DIAGNOSIS — J4540 Moderate persistent asthma, uncomplicated: Secondary | ICD-10-CM | POA: Diagnosis not present

## 2016-02-11 DIAGNOSIS — F418 Other specified anxiety disorders: Secondary | ICD-10-CM | POA: Diagnosis not present

## 2016-03-10 ENCOUNTER — Ambulatory Visit (HOSPITAL_COMMUNITY)
Admission: RE | Admit: 2016-03-10 | Discharge: 2016-03-10 | Disposition: A | Discharge status: Home / Self Care | Payer: PPO | Source: Ambulatory Visit | Attending: Urology | Admitting: Urology

## 2016-03-10 ENCOUNTER — Other Ambulatory Visit: Payer: Self-pay | Admitting: Urology

## 2016-03-10 ENCOUNTER — Ambulatory Visit (INDEPENDENT_AMBULATORY_CARE_PROVIDER_SITE_OTHER): Payer: PPO | Admitting: Urology

## 2016-03-10 DIAGNOSIS — R3129 Other microscopic hematuria: Secondary | ICD-10-CM | POA: Diagnosis not present

## 2016-03-10 DIAGNOSIS — N200 Calculus of kidney: Secondary | ICD-10-CM

## 2016-03-10 DIAGNOSIS — N390 Urinary tract infection, site not specified: Secondary | ICD-10-CM

## 2016-03-15 DIAGNOSIS — M79644 Pain in right finger(s): Secondary | ICD-10-CM | POA: Diagnosis not present

## 2016-04-19 DIAGNOSIS — H25013 Cortical age-related cataract, bilateral: Secondary | ICD-10-CM | POA: Diagnosis not present

## 2016-04-19 DIAGNOSIS — H35033 Hypertensive retinopathy, bilateral: Secondary | ICD-10-CM | POA: Diagnosis not present

## 2016-04-19 DIAGNOSIS — E119 Type 2 diabetes mellitus without complications: Secondary | ICD-10-CM | POA: Diagnosis not present

## 2016-04-19 DIAGNOSIS — H2513 Age-related nuclear cataract, bilateral: Secondary | ICD-10-CM | POA: Diagnosis not present

## 2016-04-19 DIAGNOSIS — H40013 Open angle with borderline findings, low risk, bilateral: Secondary | ICD-10-CM | POA: Diagnosis not present

## 2016-04-19 DIAGNOSIS — H52223 Regular astigmatism, bilateral: Secondary | ICD-10-CM | POA: Diagnosis not present

## 2016-04-19 DIAGNOSIS — I10 Essential (primary) hypertension: Secondary | ICD-10-CM | POA: Diagnosis not present

## 2016-04-19 DIAGNOSIS — H5213 Myopia, bilateral: Secondary | ICD-10-CM | POA: Diagnosis not present

## 2016-05-12 DIAGNOSIS — Z79899 Other long term (current) drug therapy: Secondary | ICD-10-CM | POA: Diagnosis not present

## 2016-05-12 DIAGNOSIS — K52838 Other microscopic colitis: Secondary | ICD-10-CM | POA: Diagnosis not present

## 2016-05-12 DIAGNOSIS — M545 Low back pain: Secondary | ICD-10-CM | POA: Diagnosis not present

## 2016-05-12 DIAGNOSIS — N183 Chronic kidney disease, stage 3 (moderate): Secondary | ICD-10-CM | POA: Diagnosis not present

## 2016-05-12 DIAGNOSIS — E039 Hypothyroidism, unspecified: Secondary | ICD-10-CM | POA: Diagnosis not present

## 2016-05-12 DIAGNOSIS — R7301 Impaired fasting glucose: Secondary | ICD-10-CM | POA: Diagnosis not present

## 2016-05-12 DIAGNOSIS — G8929 Other chronic pain: Secondary | ICD-10-CM | POA: Diagnosis not present

## 2016-05-16 DIAGNOSIS — G43919 Migraine, unspecified, intractable, without status migrainosus: Secondary | ICD-10-CM | POA: Diagnosis not present

## 2016-06-09 ENCOUNTER — Ambulatory Visit: Payer: PPO | Admitting: Urology

## 2016-07-21 ENCOUNTER — Ambulatory Visit: Payer: PPO | Admitting: Urology

## 2016-07-28 DIAGNOSIS — Z79899 Other long term (current) drug therapy: Secondary | ICD-10-CM | POA: Diagnosis not present

## 2016-08-08 DIAGNOSIS — R7301 Impaired fasting glucose: Secondary | ICD-10-CM | POA: Diagnosis not present

## 2016-08-08 DIAGNOSIS — Z79899 Other long term (current) drug therapy: Secondary | ICD-10-CM | POA: Diagnosis not present

## 2016-08-08 DIAGNOSIS — E039 Hypothyroidism, unspecified: Secondary | ICD-10-CM | POA: Diagnosis not present

## 2016-08-08 DIAGNOSIS — E785 Hyperlipidemia, unspecified: Secondary | ICD-10-CM | POA: Diagnosis not present

## 2016-08-08 DIAGNOSIS — N183 Chronic kidney disease, stage 3 (moderate): Secondary | ICD-10-CM | POA: Diagnosis not present

## 2016-08-11 DIAGNOSIS — M545 Low back pain: Secondary | ICD-10-CM | POA: Diagnosis not present

## 2016-08-11 DIAGNOSIS — K52838 Other microscopic colitis: Secondary | ICD-10-CM | POA: Diagnosis not present

## 2016-08-11 DIAGNOSIS — E039 Hypothyroidism, unspecified: Secondary | ICD-10-CM | POA: Diagnosis not present

## 2016-08-11 DIAGNOSIS — R7301 Impaired fasting glucose: Secondary | ICD-10-CM | POA: Diagnosis not present

## 2016-08-11 DIAGNOSIS — Z79899 Other long term (current) drug therapy: Secondary | ICD-10-CM | POA: Diagnosis not present

## 2016-08-11 DIAGNOSIS — J4540 Moderate persistent asthma, uncomplicated: Secondary | ICD-10-CM | POA: Diagnosis not present

## 2016-08-11 DIAGNOSIS — N183 Chronic kidney disease, stage 3 (moderate): Secondary | ICD-10-CM | POA: Diagnosis not present

## 2016-08-11 DIAGNOSIS — G8929 Other chronic pain: Secondary | ICD-10-CM | POA: Diagnosis not present

## 2016-08-11 DIAGNOSIS — F419 Anxiety disorder, unspecified: Secondary | ICD-10-CM | POA: Diagnosis not present

## 2016-10-13 IMAGING — DX DG ABDOMEN 1V
1 series · 1 of 1 positions shown · non-contrast
Comparison: September 03, 2015

CLINICAL DATA: Nephrolithiasis

EXAM:
ABDOMEN - 1 VIEW

[abdomen kub]
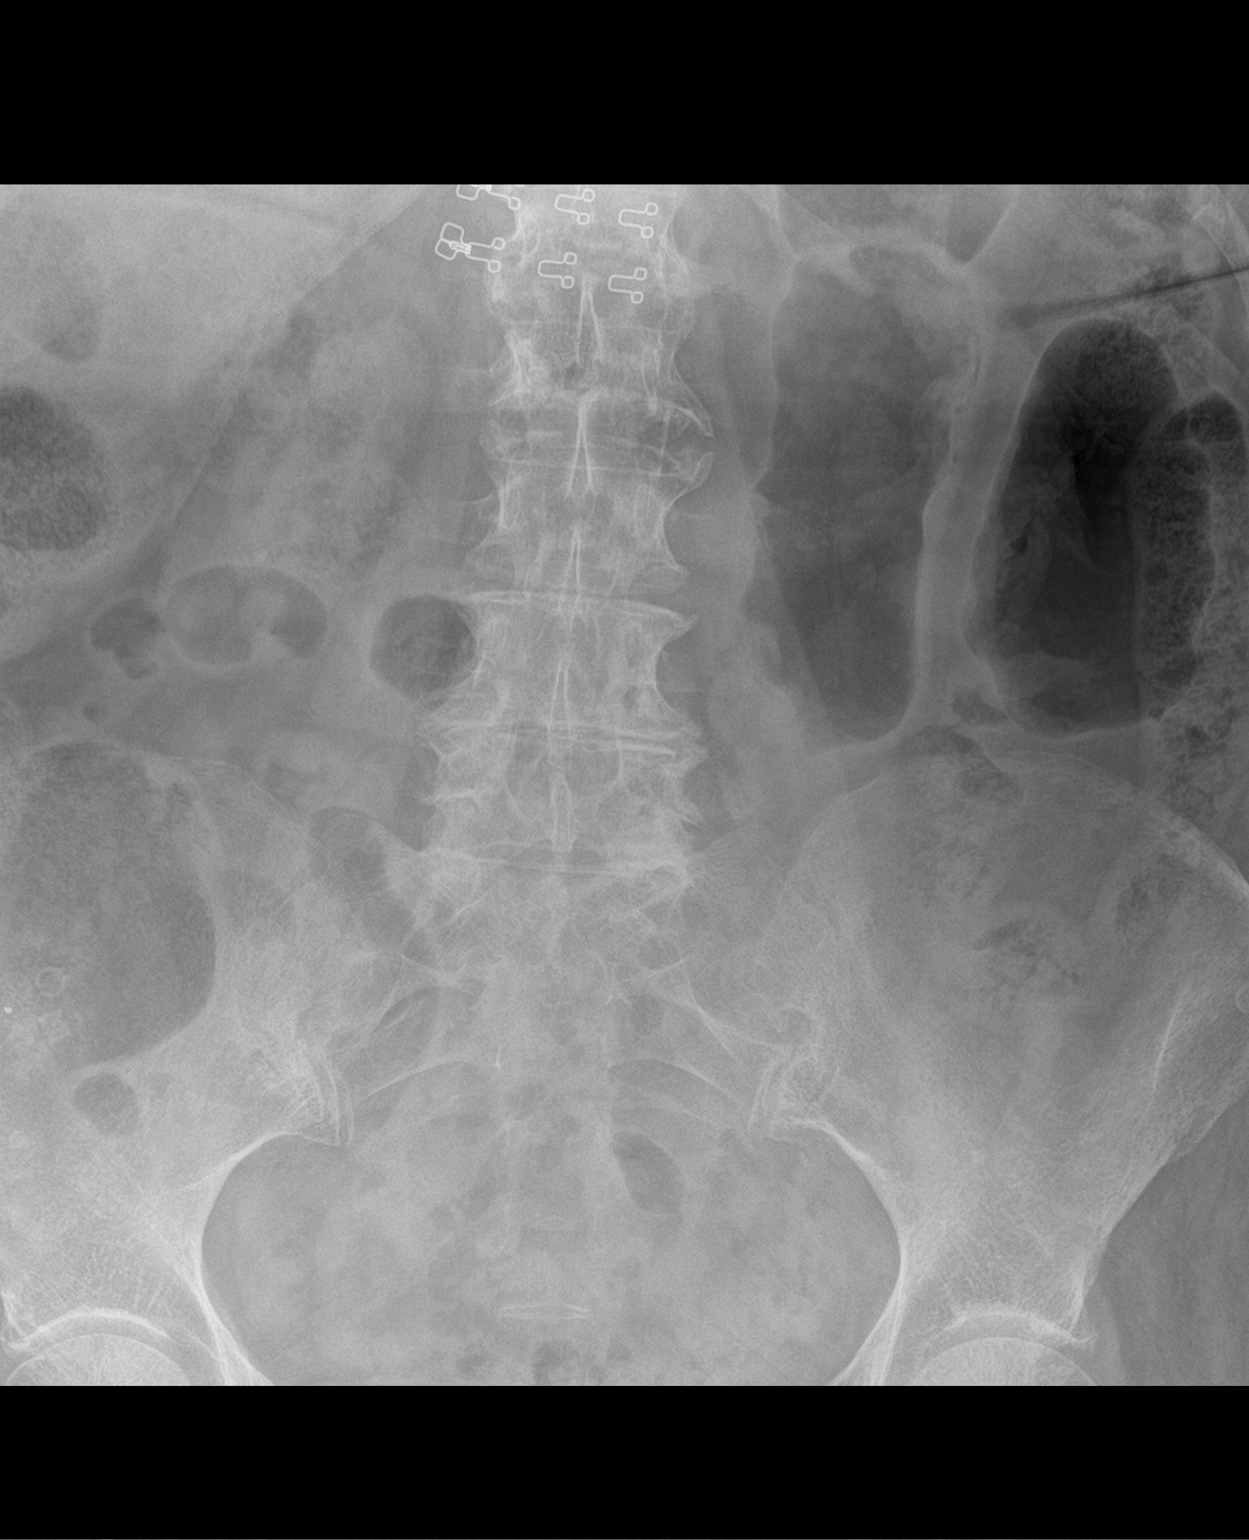

[1 of 1 positions shown; findings below may reference images not displayed]

FINDINGS: No abnormal calcifications are evident. Note that overlying stool
could obscure small renal calculi. There is diffuse stool throughout
much of the colon. The overall bowel gas pattern is unremarkable
without obstruction or free air apparent.
IMPRESSION: No abnormal calcifications evident. Stool could obscure small
calculi. Diffuse stool noted. Overall bowel gas pattern
unremarkable.

## 2016-11-09 DIAGNOSIS — J4540 Moderate persistent asthma, uncomplicated: Secondary | ICD-10-CM | POA: Diagnosis not present

## 2016-11-09 DIAGNOSIS — I10 Essential (primary) hypertension: Secondary | ICD-10-CM | POA: Diagnosis not present

## 2016-11-09 DIAGNOSIS — E039 Hypothyroidism, unspecified: Secondary | ICD-10-CM | POA: Diagnosis not present

## 2016-11-09 DIAGNOSIS — R7301 Impaired fasting glucose: Secondary | ICD-10-CM | POA: Diagnosis not present

## 2016-11-09 DIAGNOSIS — G8929 Other chronic pain: Secondary | ICD-10-CM | POA: Diagnosis not present

## 2016-11-09 DIAGNOSIS — M545 Low back pain: Secondary | ICD-10-CM | POA: Diagnosis not present

## 2016-11-09 DIAGNOSIS — F418 Other specified anxiety disorders: Secondary | ICD-10-CM | POA: Diagnosis not present

## 2016-11-09 DIAGNOSIS — E785 Hyperlipidemia, unspecified: Secondary | ICD-10-CM | POA: Diagnosis not present

## 2016-11-09 DIAGNOSIS — N183 Chronic kidney disease, stage 3 (moderate): Secondary | ICD-10-CM | POA: Diagnosis not present

## 2016-11-09 DIAGNOSIS — K52838 Other microscopic colitis: Secondary | ICD-10-CM | POA: Diagnosis not present

## 2016-11-09 DIAGNOSIS — Z79899 Other long term (current) drug therapy: Secondary | ICD-10-CM | POA: Diagnosis not present

## 2016-11-21 DIAGNOSIS — J060 Acute laryngopharyngitis: Secondary | ICD-10-CM | POA: Diagnosis not present

## 2016-11-21 DIAGNOSIS — J4541 Moderate persistent asthma with (acute) exacerbation: Secondary | ICD-10-CM | POA: Diagnosis not present

## 2016-12-25 DIAGNOSIS — E785 Hyperlipidemia, unspecified: Secondary | ICD-10-CM | POA: Diagnosis not present

## 2016-12-25 DIAGNOSIS — N183 Chronic kidney disease, stage 3 (moderate): Secondary | ICD-10-CM | POA: Diagnosis not present

## 2016-12-25 DIAGNOSIS — E039 Hypothyroidism, unspecified: Secondary | ICD-10-CM | POA: Diagnosis not present

## 2016-12-25 DIAGNOSIS — Z79899 Other long term (current) drug therapy: Secondary | ICD-10-CM | POA: Diagnosis not present

## 2016-12-25 DIAGNOSIS — F411 Generalized anxiety disorder: Secondary | ICD-10-CM | POA: Diagnosis not present

## 2016-12-25 DIAGNOSIS — R7301 Impaired fasting glucose: Secondary | ICD-10-CM | POA: Diagnosis not present

## 2016-12-25 DIAGNOSIS — I10 Essential (primary) hypertension: Secondary | ICD-10-CM | POA: Diagnosis not present

## 2016-12-25 DIAGNOSIS — J4540 Moderate persistent asthma, uncomplicated: Secondary | ICD-10-CM | POA: Diagnosis not present

## 2017-01-22 ENCOUNTER — Emergency Department (HOSPITAL_COMMUNITY): Payer: PPO

## 2017-01-22 ENCOUNTER — Encounter (HOSPITAL_COMMUNITY): Payer: Self-pay | Admitting: Emergency Medicine

## 2017-01-22 ENCOUNTER — Observation Stay (HOSPITAL_COMMUNITY)
Admission: EM | Admit: 2017-01-22 | Discharge: 2017-01-22 | Disposition: A | Discharge status: Home / Self Care | Payer: PPO | Attending: Nephrology | Admitting: Nephrology

## 2017-01-22 DIAGNOSIS — S299XXA Unspecified injury of thorax, initial encounter: Secondary | ICD-10-CM | POA: Diagnosis not present

## 2017-01-22 DIAGNOSIS — M1990 Unspecified osteoarthritis, unspecified site: Secondary | ICD-10-CM | POA: Insufficient documentation

## 2017-01-22 DIAGNOSIS — F329 Major depressive disorder, single episode, unspecified: Secondary | ICD-10-CM | POA: Insufficient documentation

## 2017-01-22 DIAGNOSIS — K219 Gastro-esophageal reflux disease without esophagitis: Secondary | ICD-10-CM | POA: Diagnosis not present

## 2017-01-22 DIAGNOSIS — M5136 Other intervertebral disc degeneration, lumbar region: Secondary | ICD-10-CM | POA: Insufficient documentation

## 2017-01-22 DIAGNOSIS — M549 Dorsalgia, unspecified: Secondary | ICD-10-CM | POA: Diagnosis not present

## 2017-01-22 DIAGNOSIS — E039 Hypothyroidism, unspecified: Secondary | ICD-10-CM | POA: Insufficient documentation

## 2017-01-22 DIAGNOSIS — S79912A Unspecified injury of left hip, initial encounter: Secondary | ICD-10-CM | POA: Diagnosis not present

## 2017-01-22 DIAGNOSIS — I700 Atherosclerosis of aorta: Secondary | ICD-10-CM | POA: Diagnosis not present

## 2017-01-22 DIAGNOSIS — Z8619 Personal history of other infectious and parasitic diseases: Secondary | ICD-10-CM | POA: Diagnosis not present

## 2017-01-22 DIAGNOSIS — Z79899 Other long term (current) drug therapy: Secondary | ICD-10-CM | POA: Diagnosis not present

## 2017-01-22 DIAGNOSIS — N200 Calculus of kidney: Secondary | ICD-10-CM | POA: Insufficient documentation

## 2017-01-22 DIAGNOSIS — Y93E2 Activity, laundry: Secondary | ICD-10-CM | POA: Insufficient documentation

## 2017-01-22 DIAGNOSIS — M25551 Pain in right hip: Secondary | ICD-10-CM

## 2017-01-22 DIAGNOSIS — I10 Essential (primary) hypertension: Secondary | ICD-10-CM | POA: Diagnosis not present

## 2017-01-22 DIAGNOSIS — Z880 Allergy status to penicillin: Secondary | ICD-10-CM | POA: Insufficient documentation

## 2017-01-22 DIAGNOSIS — E119 Type 2 diabetes mellitus without complications: Secondary | ICD-10-CM | POA: Diagnosis not present

## 2017-01-22 DIAGNOSIS — Z7982 Long term (current) use of aspirin: Secondary | ICD-10-CM | POA: Diagnosis not present

## 2017-01-22 DIAGNOSIS — Z882 Allergy status to sulfonamides status: Secondary | ICD-10-CM | POA: Diagnosis not present

## 2017-01-22 DIAGNOSIS — F419 Anxiety disorder, unspecified: Secondary | ICD-10-CM | POA: Diagnosis not present

## 2017-01-22 DIAGNOSIS — R079 Chest pain, unspecified: Secondary | ICD-10-CM | POA: Diagnosis not present

## 2017-01-22 DIAGNOSIS — R404 Transient alteration of awareness: Secondary | ICD-10-CM | POA: Diagnosis not present

## 2017-01-22 DIAGNOSIS — R55 Syncope and collapse: Secondary | ICD-10-CM | POA: Diagnosis not present

## 2017-01-22 DIAGNOSIS — Y92009 Unspecified place in unspecified non-institutional (private) residence as the place of occurrence of the external cause: Secondary | ICD-10-CM | POA: Diagnosis not present

## 2017-01-22 DIAGNOSIS — Z881 Allergy status to other antibiotic agents status: Secondary | ICD-10-CM | POA: Insufficient documentation

## 2017-01-22 DIAGNOSIS — G43909 Migraine, unspecified, not intractable, without status migrainosus: Secondary | ICD-10-CM | POA: Insufficient documentation

## 2017-01-22 DIAGNOSIS — W1839XA Other fall on same level, initial encounter: Secondary | ICD-10-CM | POA: Insufficient documentation

## 2017-01-22 DIAGNOSIS — M79604 Pain in right leg: Secondary | ICD-10-CM | POA: Diagnosis not present

## 2017-01-22 DIAGNOSIS — Z90710 Acquired absence of both cervix and uterus: Secondary | ICD-10-CM | POA: Insufficient documentation

## 2017-01-22 DIAGNOSIS — Z87891 Personal history of nicotine dependence: Secondary | ICD-10-CM | POA: Insufficient documentation

## 2017-01-22 LAB — COMPREHENSIVE METABOLIC PANEL
ALT: 21 U/L (ref 14–54)
AST: 19 U/L (ref 15–41)
Albumin: 3.5 g/dL (ref 3.5–5.0)
Alkaline Phosphatase: 73 U/L (ref 38–126)
Anion gap: 8 (ref 5–15)
BUN: 16 mg/dL (ref 6–20)
CO2: 22 mmol/L (ref 22–32)
CREATININE: 0.96 mg/dL (ref 0.44–1.00)
Calcium: 9 mg/dL (ref 8.9–10.3)
Chloride: 108 mmol/L (ref 101–111)
GFR, EST NON AFRICAN AMERICAN: 60 mL/min — AB (ref >60)
Glucose, Bld: 114 mg/dL — ABNORMAL HIGH (ref 65–99)
Potassium: 3.7 mmol/L (ref 3.5–5.1)
Sodium: 138 mmol/L (ref 135–145)
Total Bilirubin: 0.3 mg/dL (ref 0.3–1.2)
Total Protein: 6.4 g/dL — ABNORMAL LOW (ref 6.5–8.1)

## 2017-01-22 LAB — CBC
HCT: 40.2 % (ref 36.0–46.0)
Hemoglobin: 13.5 g/dL (ref 12.0–15.0)
MCH: 31.3 pg (ref 26.0–34.0)
MCHC: 33.6 g/dL (ref 30.0–36.0)
MCV: 93.1 fL (ref 78.0–100.0)
Platelets: 248 10*3/uL (ref 150–400)
RBC: 4.32 MIL/uL (ref 3.87–5.11)
RDW: 13.7 % (ref 11.5–15.5)
WBC: 8.1 10*3/uL (ref 4.0–10.5)

## 2017-01-22 LAB — PROTIME-INR
INR: 0.98
PROTHROMBIN TIME: 13 s (ref 11.4–15.2)

## 2017-01-22 LAB — I-STAT TROPONIN, ED: Troponin i, poc: 0 ng/mL (ref 0.00–0.08)

## 2017-01-22 MED ORDER — OXYCODONE-ACETAMINOPHEN 5-325 MG PO TABS
1.0000 | ORAL_TABLET | Freq: Once | ORAL | Status: AC
  Administered 2017-01-22: 1 via ORAL
  Filled 2017-01-22: qty 1

## 2017-01-22 MED ORDER — SODIUM CHLORIDE 0.9 % IV BOLUS (SEPSIS)
500.0000 mL | Freq: Once | INTRAVENOUS | Status: AC
  Administered 2017-01-22: 500 mL via INTRAVENOUS

## 2017-01-22 NOTE — ED Triage Notes (Signed)
Pt stated she was in house. Possible syncopal episode. Pt doesn't remember falling  Pt states " all I remember is my grandson saying are you ok" Right hip pain 8/10.

## 2017-01-22 NOTE — ED Provider Notes (Signed)
[facility] DEPT Provider Note   CSN: [id number] Arrival date & time: 01/22/17  1824     History   Chief Complaint Chief Complaint  Patient presents with  . Fall    HPI Courtney Orozco is a 66 y.o. female.  HPI  This is a 66 year old lady with a history of diabetes, GERD, headaches, hypothyroidism who presents today after syncopal episode. He states she was with her grandson and she was putting laundry in when she suddenly found herself on the ground having fallen. She denies any prodrome. She denies any previous history. She denies chest pain, headache, neck pain, nausea, vomiting, fever, or chills. She has been drinking fluids as usual.  Past Medical History:  Diagnosis Date  . Anxiety   . Arthritis   . Back pain   . Depression   . Diabetes mellitus without complication ([place])    no meds in 5 years   . GERD (gastroesophageal reflux disease)   . Headache    hx of migraines   . Hepatitis    hx of hep B - 44 years ago   . Hypertension   . Hypothyroidism   . Pneumonia    hx of 01/2015   . PONV (postoperative nausea and vomiting)   . Thyroid disease     Patient Active Problem List   Diagnosis Date Noted  . Renal calculus, right 04/07/2015  . Renal stone 04/06/2015    Past Surgical History:  Procedure Laterality Date  . ABDOMINAL HYSTERECTOMY    . APPENDECTOMY    . BACK SURGERY     x 4  . bladder tack surgery     . BREAST SURGERY    . CYSTOSCOPY WITH URETEROSCOPY, STONE BASKETRY AND STENT PLACEMENT Right 04/06/2015   Procedure: CYSTOSCOPY WITH RIGHT URETEROSCOPY, STONE BASKETRY AND STENT PLACEMENT;  Surgeon: Irine Seal, MD;  Location: [facility];  Service: Urology;  Laterality: Right;  . HOLMIUM LASER APPLICATION Right 3/47/4259   Procedure: HOLMIUM LASER APPLICATION;  Surgeon: Irine Seal, MD;  Location: [facility];  Service: Urology;  Laterality: Right;  . KNEE SURGERY     left  . thumb surgery     left    OB History    No data available       Home  Medications    Prior to Admission medications   Medication Sig Start Date End Date Taking? Authorizing Provider  aspirin 325 MG tablet Take 325 mg by mouth daily.   Yes Historical Provider, MD  buPROPion (WELLBUTRIN XL) 300 MG 24 hr tablet Take 300 mg by mouth daily.  02/10/15  Yes Historical Provider, MD  CRANBERRY PO Take 1 tablet by mouth daily.   Yes Historical Provider, MD  cyclobenzaprine (FLEXERIL) 5 MG tablet Take 10 mg by mouth 3 (three) times daily as needed for muscle spasms.    Yes Historical Provider, MD  diazepam (VALIUM) 5 MG tablet Take 5 mg by mouth 3 (three) times daily as needed for anxiety.  03/24/15  Yes Historical Provider, MD  HYDROcodone-acetaminophen (NORCO/VICODIN) 5-325 MG per tablet Take 1 tablet by mouth every 6 (six) hours as needed for moderate pain. Patient taking differently: Take 1 tablet by mouth every 8 (eight) hours as needed for moderate pain.  04/06/15  Yes Irine Seal, MD  levothyroxine (SYNTHROID, LEVOTHROID) 100 MCG tablet Take 100 mcg by mouth daily. Take with 121mcg tablet for total dose of 224mcg daily   Yes Historical Provider, MD  levothyroxine (SYNTHROID, LEVOTHROID) 175 MCG tablet  Take 175 mcg by mouth daily. Take one tablet with 137mcg tablet for total dose of 233mcg   Yes Historical Provider, MD  Multiple Vitamin (MULTIVITAMIN WITH MINERALS) TABS Take 1 tablet by mouth daily.   Yes Historical Provider, MD  nortriptyline (PAMELOR) 25 MG capsule Take 100 mg by mouth at bedtime.  05/13/12  Yes Historical Provider, MD  potassium chloride SA (KLOR-CON M20) 20 MEQ tablet Take 40 mEq by mouth 3 (three) times daily.    Yes Historical Provider, MD  topiramate (TOPAMAX) 200 MG tablet Take 200 mg by mouth at bedtime.  10/23/11  Yes Historical Provider, MD  verapamil (CALAN-SR) 240 MG CR tablet Take 240 mg by mouth daily.   Yes Historical Provider, MD    Family History History reviewed. No pertinent family history.  Social History Social History  Substance  Use Topics  . Smoking status: Former Smoker    Packs/day: 0.50    Types: Cigarettes  . Smokeless tobacco: Never Used  . Alcohol use No     Allergies   Azithromycin; Penicillins; and Sulfa antibiotics   Review of Systems Review of Systems  All other systems reviewed and are negative.    Physical Exam Updated Vital Signs BP (!) 117/56 (BP Location: Right Arm)   Pulse 89   Temp 98.1 F (36.7 C) (Oral)   Resp 18   Ht 5\' 4"  (1.626 m)   Wt 95.3 kg   SpO2 95%   BMI 36.05 kg/m   Physical Exam  Constitutional: She is oriented to person, place, and time. She appears well-developed and well-nourished. No distress.  HENT:  Head: Normocephalic and atraumatic.  Right Ear: External ear normal.  Left Ear: External ear normal.  Nose: Nose normal.  Eyes: Conjunctivae and EOM are normal. Pupils are equal, round, and reactive to light.  Neck: Normal range of motion. Neck supple.  Cardiovascular: Normal rate, regular rhythm and normal heart sounds.   Pulmonary/Chest: Effort normal.  Abdominal: Soft. Bowel sounds are normal.  Musculoskeletal: Normal range of motion.  Some tenderness over right hip with full active range of motion of right hip, knee, and ankle. No tenderness palpation of her bilateral shoulders, elbows, wrists. No tenderness of her left lower extremity. Tenderness palpation over cervical, thoracic, or lumbar spine.  Neurological: She is alert and oriented to person, place, and time. She exhibits normal muscle tone. Coordination normal.  Skin: Skin is warm and dry.  Abrasion right knee  Psychiatric: She has a normal mood and affect. Her behavior is normal. Thought content normal.  Nursing note and vitals reviewed.    ED Treatments / Results  Labs (all labs ordered are listed, but only abnormal results are displayed) Labs Reviewed  COMPREHENSIVE METABOLIC PANEL - Abnormal; Notable for the following:       Result Value   Glucose, Bld 114 (*)    Total Protein 6.4  (*)    GFR calc non Af Amer 60 (*)    All other components within normal limits  CBC  PROTIME-INR  I-STAT TROPOININ, ED    EKG  EKG Interpretation  Date/Time:  Monday January 22 2017 19:59:30 EDT Ventricular Rate:  91 PR Interval:    QRS Duration: 98 QT Interval:  374 QTC Calculation: 461 R Axis:   58 Text Interpretation:  Sinus rhythm Prolonged PR interval Low voltage, precordial leads Confirmed by Kimberle Stanfill MD, Andee Poles ([id number]) on 01/22/2017 8:41:58 PM       Radiology Dg Chest Portable 1 View  Result  Date: 01/22/2017 CLINICAL DATA:  Pain after fall EXAM: PORTABLE CHEST 1 VIEW COMPARISON:  03/14/2015 FINDINGS: Chronic elevation of the right hemidiaphragm with colonic interposition over the right hepatic shadow. Heart is normal in size with aortic arch atherosclerosis. No pneumonic consolidation, effusion or pneumothorax. No overt pulmonary edema. No suspicious nor acute osseous abnormality. IMPRESSION: 1. Aortic atherosclerosis. 2. Chronic elevation of the right hemidiaphragm with colonic interposition. 3. No acute cardiopulmonary disease. Electronically Signed   By: Ashley Royalty M.D.   On: 01/22/2017 19:25   Dg Hip Unilat  With Pelvis 2-3 Views Right  Result Date: 01/22/2017 CLINICAL DATA:  Pain after fall EXAM: DG HIP (WITH OR WITHOUT PELVIS) 2-3V RIGHT COMPARISON:  None. FINDINGS: There is no evidence of hip fracture or dislocation. Lower lumbar degenerative disc space narrowing from L4 through S1 with endplate spurring. Sacroiliac joints are symmetric in appearance without malalignment. Surgical tacks project over the pubic symphysis bilaterally. No acute fracture of the proximal femora. Injection granulomata project over the gluteal soft tissues. IMPRESSION: Negative for acute fracture of the bony pelvis and hips. Lower lumbar degenerative disc disease. Electronically Signed   By: Ashley Royalty M.D.   On: 01/22/2017 19:30    Procedures Procedures (including critical care time)  Medications  Ordered in ED Medications  sodium chloride 0.9 % bolus 500 mL (500 mLs Intravenous New Bag/Given 01/22/17 1952)     Initial Impression / Assessment and Plan / ED Course  I have reviewed the triage vital signs and the nursing notes.  Pertinent labs & imaging results that were available during my care of the patient were reviewed by me and considered in my medical decision making (see chart for details).     66 year old female presents today with syncope. EKG shows no evidence of acute ischemia and troponin normal. She also has right hip pain fall. There is no evidence of acute fracture noted. Plan admission for further syncope evaluation Discussed with Dr. Jonnie Finner and he will see  Final Clinical Impressions(s) / ED Diagnoses   Final diagnoses:  Syncope, unspecified syncope type  Right hip pain    New Prescriptions New Prescriptions   No medications on file     Pattricia Boss, MD 01/22/17 2054

## 2017-01-22 NOTE — ED Notes (Signed)
Pt left AMA. Refused admission to hospital.

## 2017-01-26 DIAGNOSIS — H40013 Open angle with borderline findings, low risk, bilateral: Secondary | ICD-10-CM | POA: Diagnosis not present

## 2017-01-26 DIAGNOSIS — H2511 Age-related nuclear cataract, right eye: Secondary | ICD-10-CM | POA: Diagnosis not present

## 2017-01-26 DIAGNOSIS — I10 Essential (primary) hypertension: Secondary | ICD-10-CM | POA: Diagnosis not present

## 2017-01-26 DIAGNOSIS — H35033 Hypertensive retinopathy, bilateral: Secondary | ICD-10-CM | POA: Diagnosis not present

## 2017-01-26 DIAGNOSIS — H25012 Cortical age-related cataract, left eye: Secondary | ICD-10-CM | POA: Diagnosis not present

## 2017-01-26 DIAGNOSIS — H35373 Puckering of macula, bilateral: Secondary | ICD-10-CM | POA: Diagnosis not present

## 2017-01-26 DIAGNOSIS — H25811 Combined forms of age-related cataract, right eye: Secondary | ICD-10-CM | POA: Diagnosis not present

## 2017-01-26 DIAGNOSIS — E119 Type 2 diabetes mellitus without complications: Secondary | ICD-10-CM | POA: Diagnosis not present

## 2017-01-26 DIAGNOSIS — H25812 Combined forms of age-related cataract, left eye: Secondary | ICD-10-CM | POA: Diagnosis not present

## 2017-01-26 DIAGNOSIS — H35362 Drusen (degenerative) of macula, left eye: Secondary | ICD-10-CM | POA: Diagnosis not present

## 2017-01-26 DIAGNOSIS — H25011 Cortical age-related cataract, right eye: Secondary | ICD-10-CM | POA: Diagnosis not present

## 2017-01-26 DIAGNOSIS — H2512 Age-related nuclear cataract, left eye: Secondary | ICD-10-CM | POA: Diagnosis not present

## 2017-02-09 DIAGNOSIS — R5383 Other fatigue: Secondary | ICD-10-CM | POA: Diagnosis not present

## 2017-02-09 DIAGNOSIS — Z79899 Other long term (current) drug therapy: Secondary | ICD-10-CM | POA: Diagnosis not present

## 2017-02-09 DIAGNOSIS — I959 Hypotension, unspecified: Secondary | ICD-10-CM | POA: Diagnosis not present

## 2017-02-09 DIAGNOSIS — W19XXXA Unspecified fall, initial encounter: Secondary | ICD-10-CM | POA: Diagnosis not present

## 2017-02-09 DIAGNOSIS — G4733 Obstructive sleep apnea (adult) (pediatric): Secondary | ICD-10-CM | POA: Diagnosis not present

## 2017-02-16 DIAGNOSIS — F439 Reaction to severe stress, unspecified: Secondary | ICD-10-CM | POA: Diagnosis not present

## 2017-02-16 DIAGNOSIS — I952 Hypotension due to drugs: Secondary | ICD-10-CM | POA: Diagnosis not present

## 2017-04-09 DIAGNOSIS — H2513 Age-related nuclear cataract, bilateral: Secondary | ICD-10-CM | POA: Diagnosis not present

## 2017-04-09 DIAGNOSIS — H40013 Open angle with borderline findings, low risk, bilateral: Secondary | ICD-10-CM | POA: Diagnosis not present

## 2017-04-09 DIAGNOSIS — H2512 Age-related nuclear cataract, left eye: Secondary | ICD-10-CM | POA: Diagnosis not present

## 2017-04-11 IMAGING — US US RENAL
1 series · 14 of 25 positions shown · non-contrast
Comparison: Ultrasound 05/26/2015.  CT abdomen pelvis 03/14/2015

CLINICAL DATA: Nephrolithiasis

EXAM:
RENAL / URINARY TRACT ULTRASOUND COMPLETE

[Series 1: us renal · 0.22mm/px · 14 of 30 slices shown]
[im 1/30]
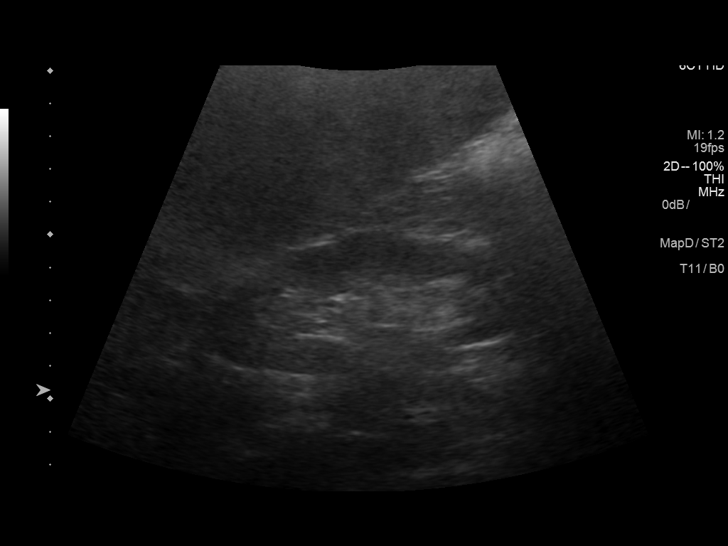
[im 3/30]
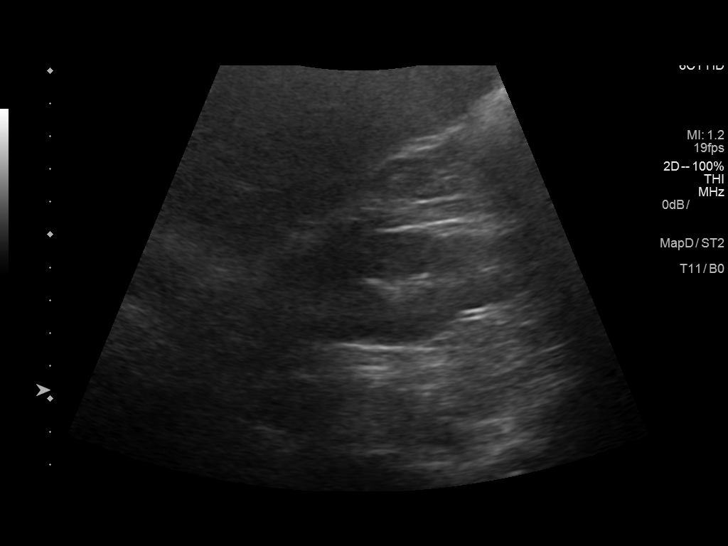
[im 5/30]
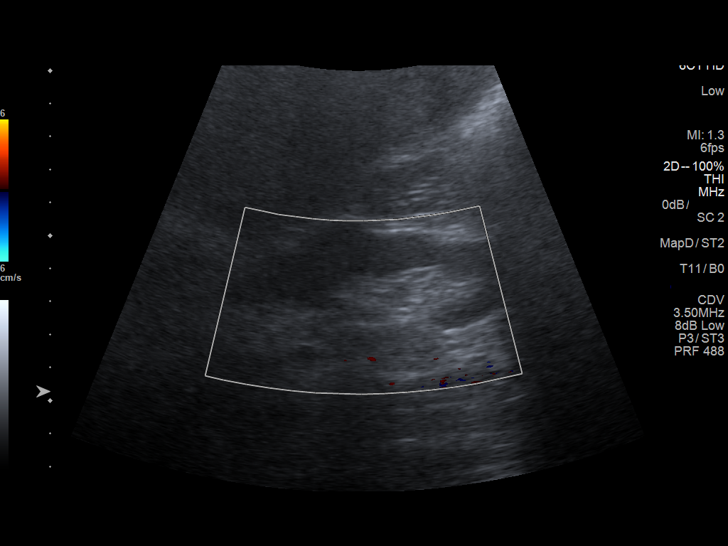
[im 8/30]
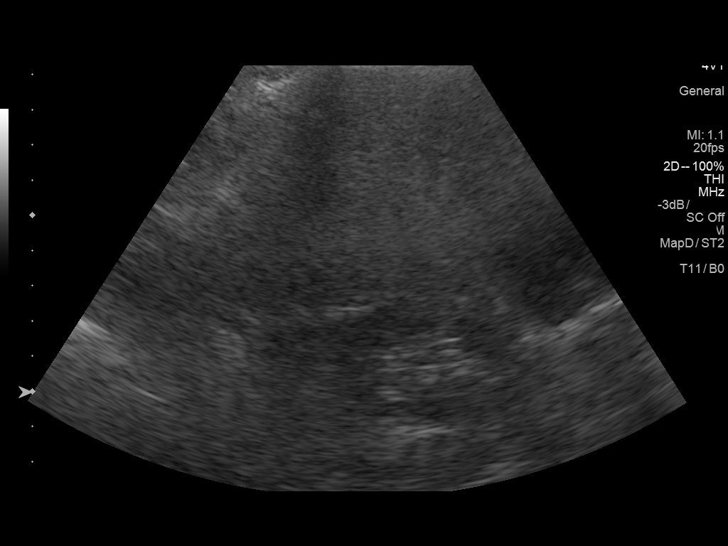
[im 10/30]
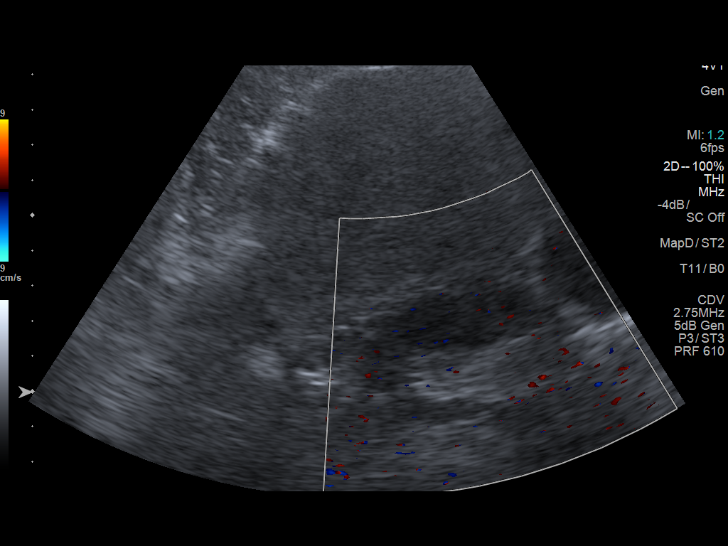
[im 11/30]
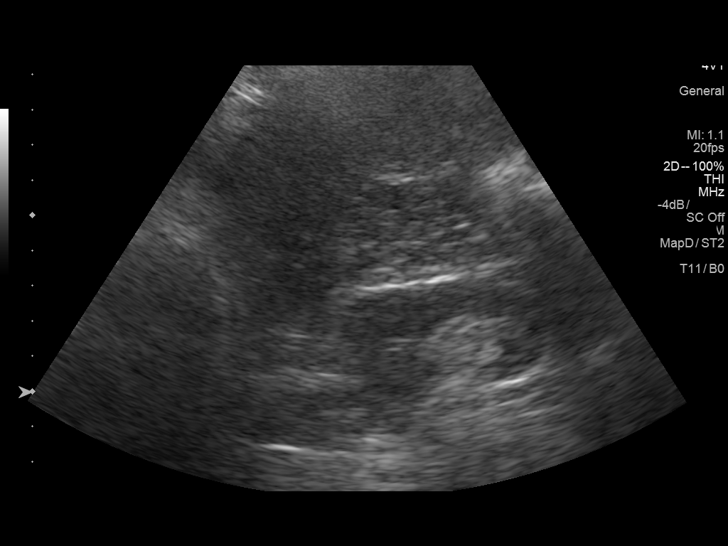
[im 14/30]
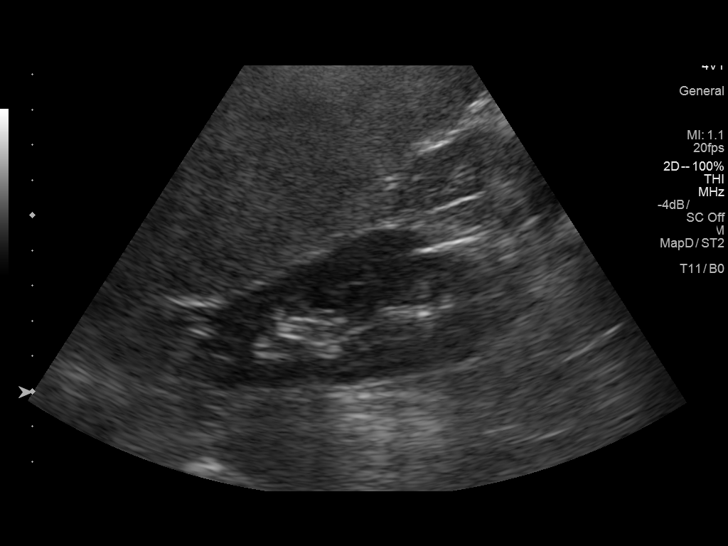
[im 16/30]
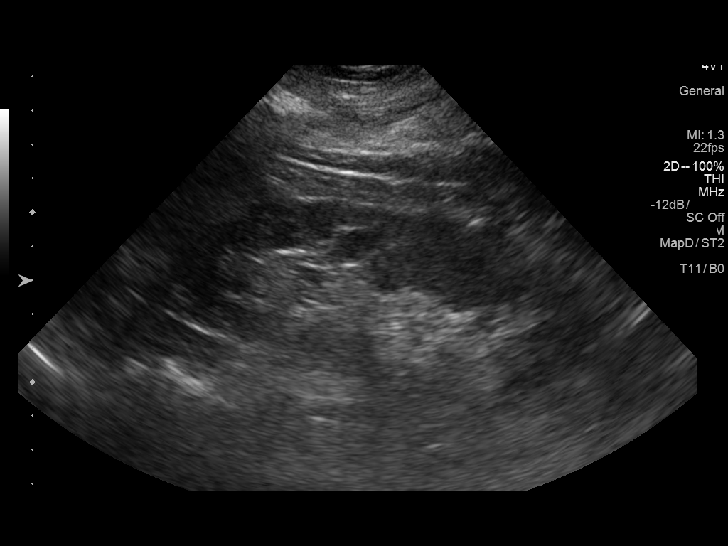
[im 19/30]
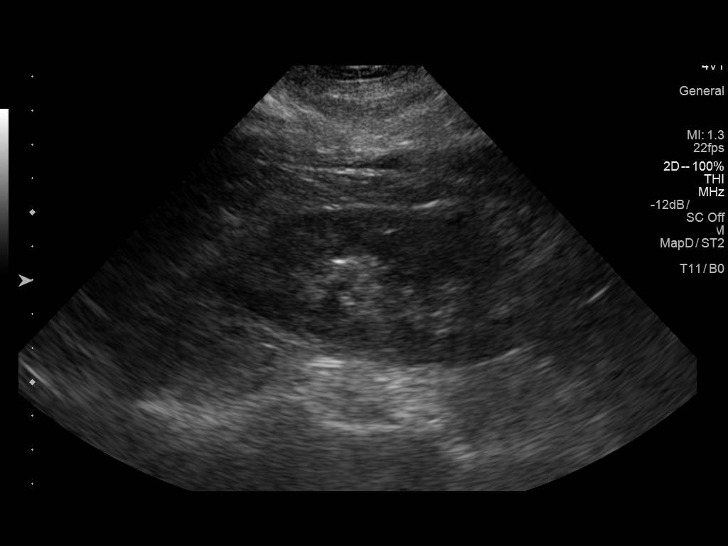
[im 20/30]
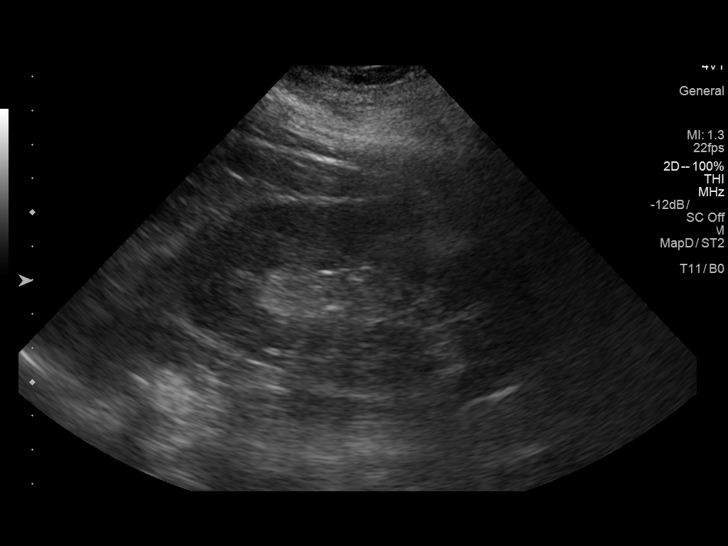
[im 22/30]
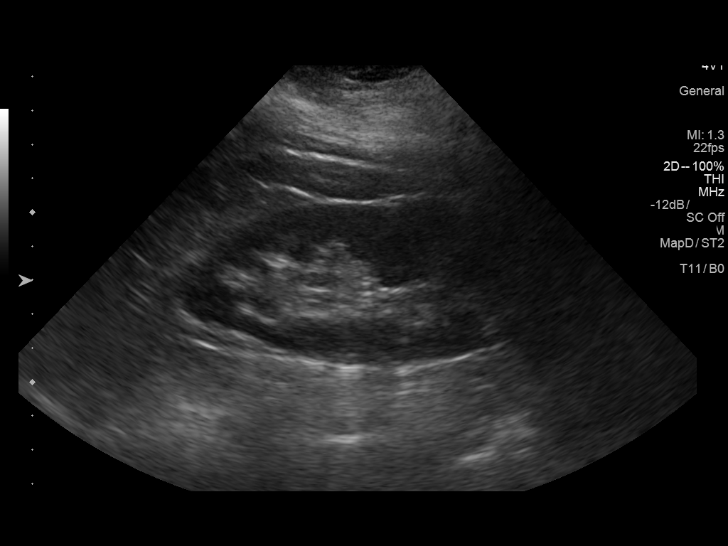
[im 25/30]
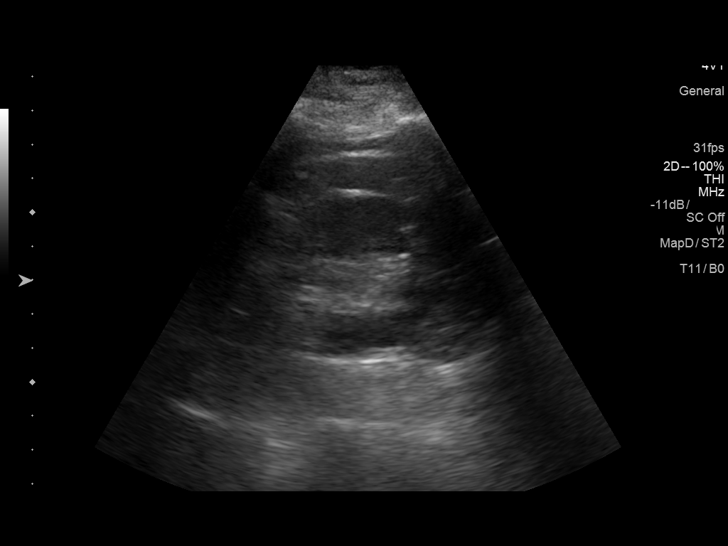
[im 27/30]
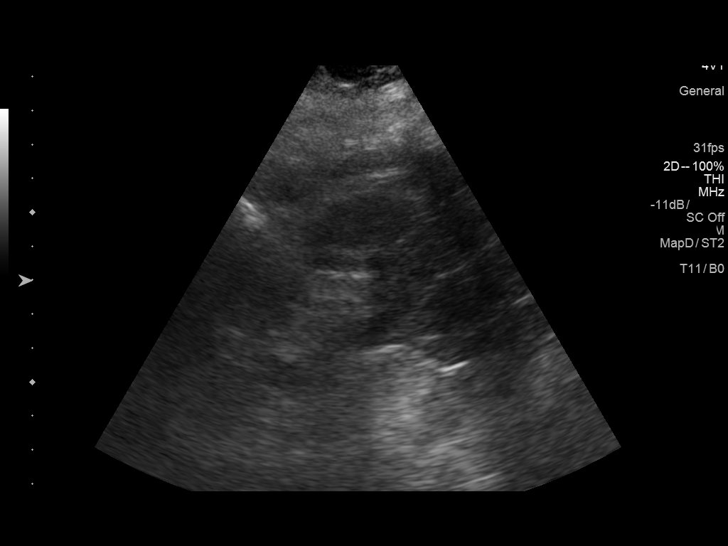
[im 30/30]
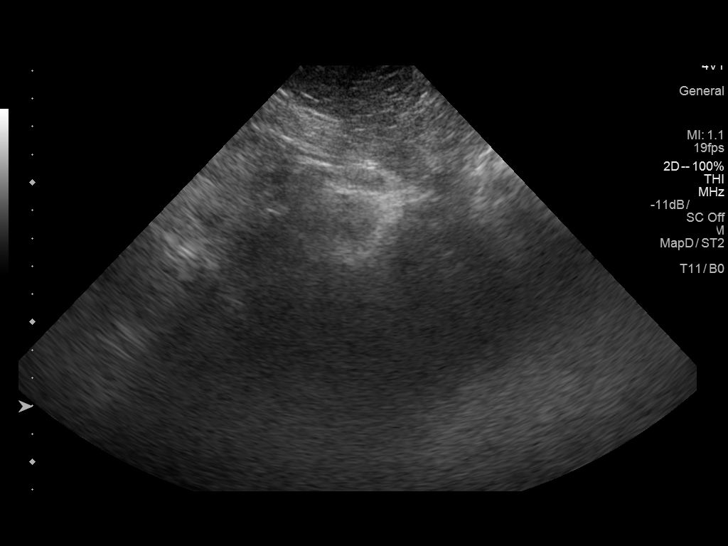

[14 of 25 positions shown; findings below may reference images not displayed]

FINDINGS: Right Kidney:

Length: 11.7 cm.. Echogenicity within normal limits. No mass or
hydronephrosis visualized.

Left Kidney:

Length: 13.0 cm. Echogenicity within normal limits. No mass or
hydronephrosis visualized.

Bladder:

Empty and not visualized.
IMPRESSION: Negative renal ultrasound. No shadowing renal calculi. Negative for
hydronephrosis.

## 2017-04-18 DIAGNOSIS — G43919 Migraine, unspecified, intractable, without status migrainosus: Secondary | ICD-10-CM | POA: Diagnosis not present

## 2017-04-18 NOTE — Patient Instructions (Signed)
Your procedure is scheduled on: 04/30/2017  Report to [facility] at  1000    AM.  Call this number if you have problems the morning of surgery: [phone]   Do not eat food or drink liquids :After Midnight.      Take these medicines the morning of surgery with A SIP OF WATER: flexaril, norco, synthroid, verapamil.   Do not wear jewelry, make-up or nail polish.  Do not wear lotions, powders, or perfumes. You may wear deodorant.  Do not shave 48 hours prior to surgery.  Do not bring valuables to the hospital.  Contacts, dentures or bridgework may not be worn into surgery.  Leave suitcase in the car. After surgery it may be brought to your room.  For patients admitted to the hospital, checkout time is 11:00 AM the day of discharge.   Patients discharged the day of surgery will not be allowed to drive home.  :     Please read over the following fact sheets that you were given: Coughing and Deep Breathing, Surgical Site Infection Prevention, Anesthesia Post-op Instructions and Care and Recovery After Surgery    Cataract A cataract is a clouding of the lens of the eye. When a lens becomes cloudy, vision is reduced based on the degree and nature of the clouding. Many cataracts reduce vision to some degree. Some cataracts make people more near-sighted as they develop. Other cataracts increase glare. Cataracts that are ignored and become worse can sometimes look white. The white color can be seen through the pupil. CAUSES   Aging. However, cataracts may occur at any age, even in newborns.   Certain drugs.   Trauma to the eye.   Certain diseases such as diabetes.   Specific eye diseases such as chronic inflammation inside the eye or a sudden attack of a rare form of glaucoma.   Inherited or acquired medical problems.  SYMPTOMS   Gradual, progressive drop in vision in the affected eye.   Severe, rapid visual loss. This most often happens when trauma is the cause.  DIAGNOSIS  To detect a  cataract, an eye doctor examines the lens. Cataracts are best diagnosed with an exam of the eyes with the pupils enlarged (dilated) by drops.  TREATMENT  For an early cataract, vision may improve by using different eyeglasses or stronger lighting. If that does not help your vision, surgery is the only effective treatment. A cataract needs to be surgically removed when vision loss interferes with your everyday activities, such as driving, reading, or watching TV. A cataract may also have to be removed if it prevents examination or treatment of another eye problem. Surgery removes the cloudy lens and usually replaces it with a substitute lens (intraocular lens, IOL).  At a time when both you and your doctor agree, the cataract will be surgically removed. If you have cataracts in both eyes, only one is usually removed at a time. This allows the operated eye to heal and be out of danger from any possible problems after surgery (such as infection or poor wound healing). In rare cases, a cataract may be doing damage to your eye. In these cases, your caregiver may advise surgical removal right away. The vast majority of people who have cataract surgery have better vision afterward. HOME CARE INSTRUCTIONS  If you are not planning surgery, you may be asked to do the following:  Use different eyeglasses.   Use stronger or brighter lighting.   Ask your eye doctor about  reducing your medicine dose or changing medicines if it is thought that a medicine caused your cataract. Changing medicines does not make the cataract go away on its own.   Become familiar with your surroundings. Poor vision can lead to injury. Avoid bumping into things on the affected side. You are at a higher risk for tripping or falling.   Exercise extreme care when driving or operating machinery.   Wear sunglasses if you are sensitive to bright light or experiencing problems with glare.  SEEK IMMEDIATE MEDICAL CARE IF:   You have a  worsening or sudden vision loss.   You notice redness, swelling, or increasing pain in the eye.   You have a fever.  Document Released: 10/09/2005 Document Revised: 09/28/2011 Document Reviewed: 06/02/2011 [facility] Patient Information 2012 [place].PATIENT INSTRUCTIONS POST-ANESTHESIA  IMMEDIATELY FOLLOWING SURGERY:  Do not drive or operate machinery for the first twenty four hours after surgery.  Do not make any important decisions for twenty four hours after surgery or while taking narcotic pain medications or sedatives.  If you develop intractable nausea and vomiting or a severe headache please notify your doctor immediately.  FOLLOW-UP:  Please make an appointment with your surgeon as instructed. You do not need to follow up with anesthesia unless specifically instructed to do so.  WOUND CARE INSTRUCTIONS (if applicable):  Keep a dry clean dressing on the anesthesia/puncture wound site if there is drainage.  Once the wound has quit draining you may leave it open to air.  Generally you should leave the bandage intact for twenty four hours unless there is drainage.  If the epidural site drains for more than 36-48 hours please call the anesthesia department.  QUESTIONS?:  Please feel free to call your physician or the hospital operator if you have any questions, and they will be happy to assist you.

## 2017-04-24 ENCOUNTER — Encounter (HOSPITAL_COMMUNITY): Payer: Self-pay

## 2017-04-24 ENCOUNTER — Encounter (HOSPITAL_COMMUNITY)
Admission: RE | Admit: 2017-04-24 | Discharge: 2017-04-24 | Disposition: A | Discharge status: Home / Self Care | Payer: PPO | Source: Ambulatory Visit | Attending: Ophthalmology | Admitting: Ophthalmology

## 2017-04-24 DIAGNOSIS — Z01812 Encounter for preprocedural laboratory examination: Secondary | ICD-10-CM | POA: Insufficient documentation

## 2017-04-24 DIAGNOSIS — H268 Other specified cataract: Secondary | ICD-10-CM | POA: Insufficient documentation

## 2017-04-24 LAB — CBC WITH DIFFERENTIAL/PLATELET
BASOS PCT: 0 %
Basophils Absolute: 0 10*3/uL (ref 0.0–0.1)
EOS ABS: 0.1 10*3/uL (ref 0.0–0.7)
Eosinophils Relative: 1 %
HCT: 44.3 % (ref 36.0–46.0)
HEMOGLOBIN: 14.8 g/dL (ref 12.0–15.0)
LYMPHS ABS: 3.1 10*3/uL (ref 0.7–4.0)
Lymphocytes Relative: 32 %
MCH: 30.9 pg (ref 26.0–34.0)
MCHC: 33.4 g/dL (ref 30.0–36.0)
MCV: 92.5 fL (ref 78.0–100.0)
Monocytes Absolute: 0.6 10*3/uL (ref 0.1–1.0)
Monocytes Relative: 7 %
NEUTROS ABS: 5.7 10*3/uL (ref 1.7–7.7)
NEUTROS PCT: 60 %
Platelets: 291 10*3/uL (ref 150–400)
RBC: 4.79 MIL/uL (ref 3.87–5.11)
RDW: 14 % (ref 11.5–15.5)
WBC: 9.5 10*3/uL (ref 4.0–10.5)

## 2017-04-24 LAB — BASIC METABOLIC PANEL
Anion gap: 13 (ref 5–15)
BUN: 11 mg/dL (ref 6–20)
CALCIUM: 9.2 mg/dL (ref 8.9–10.3)
CHLORIDE: 104 mmol/L (ref 101–111)
CO2: 21 mmol/L — AB (ref 22–32)
CREATININE: 1.17 mg/dL — AB (ref 0.44–1.00)
GFR, EST AFRICAN AMERICAN: 55 mL/min — AB (ref >60)
GFR, EST NON AFRICAN AMERICAN: 47 mL/min — AB (ref >60)
Glucose, Bld: 113 mg/dL — ABNORMAL HIGH (ref 65–99)
POTASSIUM: 3.7 mmol/L (ref 3.5–5.1)
SODIUM: 138 mmol/L (ref 135–145)

## 2017-04-30 ENCOUNTER — Encounter (HOSPITAL_COMMUNITY): Payer: Self-pay | Admitting: Ophthalmology

## 2017-04-30 ENCOUNTER — Ambulatory Visit (HOSPITAL_COMMUNITY): Payer: PPO | Admitting: Anesthesiology

## 2017-04-30 ENCOUNTER — Encounter (HOSPITAL_COMMUNITY)
Admission: RE | Disposition: A | Discharge status: Home / Self Care | Payer: Self-pay | Source: Ambulatory Visit | Attending: Ophthalmology

## 2017-04-30 ENCOUNTER — Ambulatory Visit (HOSPITAL_COMMUNITY)
Admission: RE | Admit: 2017-04-30 | Discharge: 2017-04-30 | Disposition: A | Discharge status: Home / Self Care | Payer: PPO | Source: Ambulatory Visit | Attending: Ophthalmology | Admitting: Ophthalmology

## 2017-04-30 DIAGNOSIS — K219 Gastro-esophageal reflux disease without esophagitis: Secondary | ICD-10-CM | POA: Diagnosis not present

## 2017-04-30 DIAGNOSIS — Z79899 Other long term (current) drug therapy: Secondary | ICD-10-CM | POA: Insufficient documentation

## 2017-04-30 DIAGNOSIS — M1990 Unspecified osteoarthritis, unspecified site: Secondary | ICD-10-CM | POA: Diagnosis not present

## 2017-04-30 DIAGNOSIS — Z7984 Long term (current) use of oral hypoglycemic drugs: Secondary | ICD-10-CM | POA: Insufficient documentation

## 2017-04-30 DIAGNOSIS — E1136 Type 2 diabetes mellitus with diabetic cataract: Secondary | ICD-10-CM | POA: Insufficient documentation

## 2017-04-30 DIAGNOSIS — Z87891 Personal history of nicotine dependence: Secondary | ICD-10-CM | POA: Insufficient documentation

## 2017-04-30 DIAGNOSIS — R51 Headache: Secondary | ICD-10-CM | POA: Diagnosis not present

## 2017-04-30 DIAGNOSIS — F419 Anxiety disorder, unspecified: Secondary | ICD-10-CM | POA: Insufficient documentation

## 2017-04-30 DIAGNOSIS — F329 Major depressive disorder, single episode, unspecified: Secondary | ICD-10-CM | POA: Diagnosis not present

## 2017-04-30 DIAGNOSIS — I10 Essential (primary) hypertension: Secondary | ICD-10-CM | POA: Insufficient documentation

## 2017-04-30 DIAGNOSIS — H2512 Age-related nuclear cataract, left eye: Secondary | ICD-10-CM | POA: Diagnosis not present

## 2017-04-30 HISTORY — PX: CATARACT EXTRACTION W/PHACO: SHX586

## 2017-04-30 LAB — GLUCOSE, CAPILLARY: Glucose-Capillary: 120 mg/dL — ABNORMAL HIGH (ref 65–99)

## 2017-04-30 SURGERY — PHACOEMULSIFICATION, CATARACT, WITH IOL INSERTION
Anesthesia: Monitor Anesthesia Care | Site: Eye | Laterality: Left

## 2017-04-30 MED ORDER — FENTANYL CITRATE (PF) 100 MCG/2ML IJ SOLN
25.0000 ug | Freq: Once | INTRAMUSCULAR | Status: AC
  Administered 2017-04-30: 25 ug via INTRAVENOUS
  Filled 2017-04-30: qty 2

## 2017-04-30 MED ORDER — TETRACAINE HCL 0.5 % OP SOLN
1.0000 [drp] | OPHTHALMIC | Status: AC
  Administered 2017-04-30 (×3): 1 [drp] via OPHTHALMIC

## 2017-04-30 MED ORDER — LIDOCAINE HCL 3.5 % OP GEL
1.0000 "application " | Freq: Once | OPHTHALMIC | Status: AC
  Administered 2017-04-30: 1 via OPHTHALMIC

## 2017-04-30 MED ORDER — POVIDONE-IODINE 5 % OP SOLN
OPHTHALMIC | Status: DC | PRN
  Administered 2017-04-30: 1 via OPHTHALMIC

## 2017-04-30 MED ORDER — PROVISC 10 MG/ML IO SOLN
INTRAOCULAR | Status: DC | PRN
  Administered 2017-04-30: 0.85 mL via INTRAOCULAR

## 2017-04-30 MED ORDER — EPINEPHRINE PF 1 MG/ML IJ SOLN
INTRAOCULAR | Status: DC | PRN
  Administered 2017-04-30: 500 mL

## 2017-04-30 MED ORDER — LACTATED RINGERS IV SOLN
INTRAVENOUS | Status: DC
  Administered 2017-04-30: 11:00:00 via INTRAVENOUS

## 2017-04-30 MED ORDER — MIDAZOLAM HCL 2 MG/2ML IJ SOLN
1.0000 mg | INTRAMUSCULAR | Status: AC
  Administered 2017-04-30: 2 mg via INTRAVENOUS
  Filled 2017-04-30: qty 2

## 2017-04-30 MED ORDER — NEOMYCIN-POLYMYXIN-DEXAMETH 3.5-10000-0.1 OP SUSP
OPHTHALMIC | Status: DC | PRN
  Administered 2017-04-30: 2 [drp] via OPHTHALMIC

## 2017-04-30 MED ORDER — ONDANSETRON HCL 4 MG/2ML IJ SOLN
4.0000 mg | Freq: Once | INTRAMUSCULAR | Status: AC
Start: 2017-04-30 — End: 2017-04-30
  Administered 2017-04-30: 4 mg via INTRAVENOUS
  Filled 2017-04-30: qty 2

## 2017-04-30 MED ORDER — PHENYLEPHRINE HCL 2.5 % OP SOLN
1.0000 [drp] | OPHTHALMIC | Status: AC
  Administered 2017-04-30 (×3): 1 [drp] via OPHTHALMIC

## 2017-04-30 MED ORDER — LIDOCAINE HCL (PF) 1 % IJ SOLN
INTRAMUSCULAR | Status: DC | PRN
  Administered 2017-04-30: .8 mL

## 2017-04-30 MED ORDER — CYCLOPENTOLATE-PHENYLEPHRINE 0.2-1 % OP SOLN
1.0000 [drp] | OPHTHALMIC | Status: AC
  Administered 2017-04-30 (×3): 1 [drp] via OPHTHALMIC

## 2017-04-30 MED ORDER — BSS IO SOLN
INTRAOCULAR | Status: DC | PRN
  Administered 2017-04-30: 15 mL

## 2017-04-30 SURGICAL SUPPLY — 10 items

## 2017-04-30 NOTE — Op Note (Signed)
Date of Admission: 04/30/2017  Date of Surgery: 04/30/2017  Pre-Op Dx: Cataract Left  Eye  Post-Op Dx: Senile Nuclear Cataract  Left  Eye,  Dx Code H25.12  Surgeon: Tonny Branch, M.D.  Assistants: None  Anesthesia: Topical with MAC  Indications: Painless, progressive loss of vision with compromise of daily activities.  Surgery: Cataract Extraction with Intraocular lens Implant Left Eye  Discription: The patient had dilating drops and viscous lidocaine placed into the Left eye in the pre-op holding area. After transfer to the operating room, a time out was performed. The patient was then prepped and draped. Beginning with a 47m blade a paracentesis port was made at the surgeon's 2 o'clock position. The anterior chamber was then filled with 1% non-preserved lidocaine. This was followed by filling the anterior chamber with Provisc.  A 2.4102mkeratome blade was used to make a clear corneal incision at the temporal limbus.  A bent cystatome needle was used to create a continuous tear capsulotomy. Hydrodissection was performed with balanced salt solution on a Fine canula. The lens nucleus was then removed using the phacoemulsification handpiece. Residual cortex was removed with the I&A handpiece. The anterior chamber and capsular bag were refilled with Provisc. A posterior chamber intraocular lens was placed into the capsular bag with it's injector. The implant was positioned with the Kuglan hook. The Provisc was then removed from the anterior chamber and capsular bag with the I&A handpiece. Stromal hydration of the main incision and paracentesis port was performed with BSS on a Fine canula. The wounds were tested for leak which was negative. The patient tolerated the procedure well. There were no operative complications. The patient was then transferred to the recovery room in stable condition.  Complications: None  Specimen: None  EBL: None  Prosthetic device: Abbott Technis, PCB00, power 16.0, SN  [id number]

## 2017-04-30 NOTE — Transfer of Care (Signed)
Immediate Anesthesia Transfer of Care Note  Patient: Courtney Orozco  Procedure(s) Performed: Procedure(s) (LRB): CATARACT EXTRACTION PHACO AND INTRAOCULAR LENS PLACEMENT (IOC) (Left)  Patient Location: Shortstay  Anesthesia Type: MAC  Level of Consciousness: awake  Airway & Oxygen Therapy: Patient Spontanous Breathing   Post-op Assessment: Report given to PACU RN, Post -op Vital signs reviewed and stable and Patient moving all extremities  Post vital signs: Reviewed and stable  Complications: No apparent anesthesia complications

## 2017-04-30 NOTE — H&P (Signed)
I have reviewed the H&P, the patient was re-examined, and I have identified no interval changes in medical condition and plan of care since the history and physical of record  

## 2017-04-30 NOTE — Anesthesia Procedure Notes (Signed)
Procedure Name: MAC Date/Time: 04/30/2017 11:39 AM Performed by: Vista Deck Pre-anesthesia Checklist: Patient identified, Emergency Drugs available, Suction available, Timeout performed and Patient being monitored Patient Re-evaluated:Patient Re-evaluated prior to inductionOxygen Delivery Method: Nasal Cannula

## 2017-04-30 NOTE — Anesthesia Postprocedure Evaluation (Signed)
Anesthesia Post Note  Patient: Courtney Orozco  Procedure(s) Performed: Procedure(s) (LRB): CATARACT EXTRACTION PHACO AND INTRAOCULAR LENS PLACEMENT (IOC) (Left)  Patient location during evaluation: Short Stay Anesthesia Type: MAC Level of consciousness: awake and alert Pain management: pain level controlled Vital Signs Assessment: post-procedure vital signs reviewed and stable Respiratory status: spontaneous breathing Cardiovascular status: stable Postop Assessment: no signs of nausea or vomiting Anesthetic complications: no     Last Vitals:  Vitals:   04/30/17 1115 04/30/17 1157  BP: (!) 101/55 (!) 105/56  Pulse:  81  Resp: (!) 27 16  Temp:  36.4 C    Last Pain:  Vitals:   04/30/17 1157  TempSrc: Oral  PainSc:                  Drucie Opitz

## 2017-04-30 NOTE — Anesthesia Preprocedure Evaluation (Signed)
Anesthesia Evaluation  Patient identified by MRN, date of birth, ID band Patient awake    Reviewed: Allergy & Precautions, H&P , NPO status , Patient's Chart, lab work & pertinent test results  History of Anesthesia Complications (+) PONV and history of anesthetic complications  Airway Mallampati: II  TM Distance: >3 FB Neck ROM: Full    Dental no notable dental hx. (+) Edentulous Upper, Edentulous Lower, Dental Advisory Given   Pulmonary neg pulmonary ROS, Current Smoker, former smoker,    Pulmonary exam normal breath sounds clear to auscultation       Cardiovascular hypertension, Pt. on medications  Rhythm:Regular Rate:Normal     Neuro/Psych  Headaches, PSYCHIATRIC DISORDERS Anxiety Depression    GI/Hepatic Neg liver ROS, GERD  Medicated and Controlled,(+) Hepatitis -  Endo/Other  diabetes, Type 2, Oral Hypoglycemic AgentsHypothyroidism   Renal/GU Renal diseasenegative Renal ROS  negative genitourinary   Musculoskeletal  (+) Arthritis , Osteoarthritis,    Abdominal   Peds  Hematology negative hematology ROS (+)   Anesthesia Other Findings   Reproductive/Obstetrics negative OB ROS                             Anesthesia Physical Anesthesia Plan  ASA: III  Anesthesia Plan: MAC   Post-op Pain Management:    Induction: Intravenous  PONV Risk Score and Plan:   Airway Management Planned: Nasal Cannula  Additional Equipment:   Intra-op Plan:   Post-operative Plan:   Informed Consent: I have reviewed the patients History and Physical, chart, labs and discussed the procedure including the risks, benefits and alternatives for the proposed anesthesia with the patient or authorized representative who has indicated his/her understanding and acceptance.     Plan Discussed with:   Anesthesia Plan Comments:         Anesthesia Quick Evaluation

## 2017-04-30 NOTE — Discharge Instructions (Signed)

## 2017-05-01 ENCOUNTER — Encounter (HOSPITAL_COMMUNITY): Payer: Self-pay | Admitting: Ophthalmology

## 2017-05-02 DIAGNOSIS — G4709 Other insomnia: Secondary | ICD-10-CM | POA: Diagnosis not present

## 2017-05-02 DIAGNOSIS — G8929 Other chronic pain: Secondary | ICD-10-CM | POA: Diagnosis not present

## 2017-05-02 DIAGNOSIS — G4733 Obstructive sleep apnea (adult) (pediatric): Secondary | ICD-10-CM | POA: Diagnosis not present

## 2017-05-02 DIAGNOSIS — Z79899 Other long term (current) drug therapy: Secondary | ICD-10-CM | POA: Diagnosis not present

## 2017-05-02 DIAGNOSIS — R7301 Impaired fasting glucose: Secondary | ICD-10-CM | POA: Diagnosis not present

## 2017-05-02 DIAGNOSIS — M545 Low back pain: Secondary | ICD-10-CM | POA: Diagnosis not present

## 2017-05-02 DIAGNOSIS — F329 Major depressive disorder, single episode, unspecified: Secondary | ICD-10-CM | POA: Diagnosis not present

## 2017-05-02 DIAGNOSIS — R609 Edema, unspecified: Secondary | ICD-10-CM | POA: Diagnosis not present

## 2017-05-11 ENCOUNTER — Encounter (HOSPITAL_COMMUNITY)
Admission: RE | Admit: 2017-05-11 | Discharge: 2017-05-11 | Disposition: A | Discharge status: Home / Self Care | Payer: PPO | Source: Ambulatory Visit | Attending: Ophthalmology | Admitting: Ophthalmology

## 2017-05-11 ENCOUNTER — Encounter (HOSPITAL_COMMUNITY): Payer: Self-pay

## 2017-05-11 DIAGNOSIS — H2511 Age-related nuclear cataract, right eye: Secondary | ICD-10-CM | POA: Diagnosis not present

## 2017-05-14 ENCOUNTER — Ambulatory Visit (HOSPITAL_COMMUNITY): Payer: PPO | Admitting: Anesthesiology

## 2017-05-14 ENCOUNTER — Encounter (HOSPITAL_COMMUNITY): Payer: Self-pay | Admitting: *Deleted

## 2017-05-14 ENCOUNTER — Encounter (HOSPITAL_COMMUNITY)
Admission: RE | Disposition: A | Discharge status: Home / Self Care | Payer: Self-pay | Source: Ambulatory Visit | Attending: Ophthalmology

## 2017-05-14 ENCOUNTER — Ambulatory Visit (HOSPITAL_COMMUNITY)
Admission: RE | Admit: 2017-05-14 | Discharge: 2017-05-14 | Disposition: A | Discharge status: Home / Self Care | Payer: PPO | Source: Ambulatory Visit | Attending: Ophthalmology | Admitting: Ophthalmology

## 2017-05-14 DIAGNOSIS — F419 Anxiety disorder, unspecified: Secondary | ICD-10-CM | POA: Insufficient documentation

## 2017-05-14 DIAGNOSIS — Z7984 Long term (current) use of oral hypoglycemic drugs: Secondary | ICD-10-CM | POA: Insufficient documentation

## 2017-05-14 DIAGNOSIS — Z882 Allergy status to sulfonamides status: Secondary | ICD-10-CM | POA: Diagnosis not present

## 2017-05-14 DIAGNOSIS — M1990 Unspecified osteoarthritis, unspecified site: Secondary | ICD-10-CM | POA: Insufficient documentation

## 2017-05-14 DIAGNOSIS — K219 Gastro-esophageal reflux disease without esophagitis: Secondary | ICD-10-CM | POA: Diagnosis not present

## 2017-05-14 DIAGNOSIS — E039 Hypothyroidism, unspecified: Secondary | ICD-10-CM | POA: Insufficient documentation

## 2017-05-14 DIAGNOSIS — E119 Type 2 diabetes mellitus without complications: Secondary | ICD-10-CM | POA: Diagnosis not present

## 2017-05-14 DIAGNOSIS — Z881 Allergy status to other antibiotic agents status: Secondary | ICD-10-CM | POA: Insufficient documentation

## 2017-05-14 DIAGNOSIS — K5190 Ulcerative colitis, unspecified, without complications: Secondary | ICD-10-CM | POA: Insufficient documentation

## 2017-05-14 DIAGNOSIS — Z87891 Personal history of nicotine dependence: Secondary | ICD-10-CM | POA: Insufficient documentation

## 2017-05-14 DIAGNOSIS — Z79899 Other long term (current) drug therapy: Secondary | ICD-10-CM | POA: Diagnosis not present

## 2017-05-14 DIAGNOSIS — I10 Essential (primary) hypertension: Secondary | ICD-10-CM | POA: Insufficient documentation

## 2017-05-14 DIAGNOSIS — Z7982 Long term (current) use of aspirin: Secondary | ICD-10-CM | POA: Diagnosis not present

## 2017-05-14 DIAGNOSIS — Z880 Allergy status to penicillin: Secondary | ICD-10-CM | POA: Diagnosis not present

## 2017-05-14 DIAGNOSIS — F329 Major depressive disorder, single episode, unspecified: Secondary | ICD-10-CM | POA: Insufficient documentation

## 2017-05-14 DIAGNOSIS — H2511 Age-related nuclear cataract, right eye: Secondary | ICD-10-CM | POA: Diagnosis not present

## 2017-05-14 HISTORY — PX: CATARACT EXTRACTION W/PHACO: SHX586

## 2017-05-14 LAB — GLUCOSE, CAPILLARY: GLUCOSE-CAPILLARY: 103 mg/dL — AB (ref 65–99)

## 2017-05-14 SURGERY — PHACOEMULSIFICATION, CATARACT, WITH IOL INSERTION
Anesthesia: Monitor Anesthesia Care | Site: Eye | Laterality: Right

## 2017-05-14 MED ORDER — PROVISC 10 MG/ML IO SOLN
INTRAOCULAR | Status: DC | PRN
  Administered 2017-05-14: 0.85 mL via INTRAOCULAR

## 2017-05-14 MED ORDER — EPINEPHRINE PF 1 MG/ML IJ SOLN
INTRAMUSCULAR | Status: AC
  Filled 2017-05-14: qty 1

## 2017-05-14 MED ORDER — FENTANYL CITRATE (PF) 100 MCG/2ML IJ SOLN
INTRAMUSCULAR | Status: AC
  Filled 2017-05-14: qty 2

## 2017-05-14 MED ORDER — TETRACAINE HCL 0.5 % OP SOLN
1.0000 [drp] | OPHTHALMIC | Status: AC | PRN
  Administered 2017-05-14 (×3): 1 [drp] via OPHTHALMIC

## 2017-05-14 MED ORDER — NEOMYCIN-POLYMYXIN-DEXAMETH 3.5-10000-0.1 OP SUSP
OPHTHALMIC | Status: DC | PRN
  Administered 2017-05-14: 2 [drp] via OPHTHALMIC

## 2017-05-14 MED ORDER — LIDOCAINE HCL 3.5 % OP GEL
1.0000 "application " | Freq: Once | OPHTHALMIC | Status: AC
  Administered 2017-05-14: 1 via OPHTHALMIC

## 2017-05-14 MED ORDER — PHENYLEPHRINE HCL 2.5 % OP SOLN
1.0000 [drp] | OPHTHALMIC | Status: AC | PRN
  Administered 2017-05-14 (×3): 1 [drp] via OPHTHALMIC

## 2017-05-14 MED ORDER — ONDANSETRON HCL 4 MG/2ML IJ SOLN
4.0000 mg | Freq: Once | INTRAMUSCULAR | Status: AC
  Administered 2017-05-14: 4 mg via INTRAVENOUS

## 2017-05-14 MED ORDER — POVIDONE-IODINE 5 % OP SOLN
OPHTHALMIC | Status: DC | PRN
  Administered 2017-05-14: 1 via OPHTHALMIC

## 2017-05-14 MED ORDER — FENTANYL CITRATE (PF) 100 MCG/2ML IJ SOLN
25.0000 ug | Freq: Once | INTRAMUSCULAR | Status: AC
  Administered 2017-05-14: 25 ug via INTRAVENOUS

## 2017-05-14 MED ORDER — LIDOCAINE HCL (PF) 1 % IJ SOLN
INTRAMUSCULAR | Status: DC | PRN
  Administered 2017-05-14: .6 mL

## 2017-05-14 MED ORDER — ONDANSETRON HCL 4 MG/2ML IJ SOLN
INTRAMUSCULAR | Status: AC
  Filled 2017-05-14: qty 2

## 2017-05-14 MED ORDER — EPINEPHRINE PF 1 MG/ML IJ SOLN
INTRAOCULAR | Status: DC | PRN
  Administered 2017-05-14: 500 mL

## 2017-05-14 MED ORDER — MIDAZOLAM HCL 2 MG/2ML IJ SOLN
1.0000 mg | INTRAMUSCULAR | Status: AC
  Administered 2017-05-14: 2 mg via INTRAVENOUS

## 2017-05-14 MED ORDER — CYCLOPENTOLATE-PHENYLEPHRINE 0.2-1 % OP SOLN
1.0000 [drp] | OPHTHALMIC | Status: AC | PRN
  Administered 2017-05-14 (×3): 1 [drp] via OPHTHALMIC

## 2017-05-14 MED ORDER — LACTATED RINGERS IV SOLN
INTRAVENOUS | Status: DC
  Administered 2017-05-14: 1000 mL via INTRAVENOUS

## 2017-05-14 MED ORDER — MIDAZOLAM HCL 2 MG/2ML IJ SOLN
INTRAMUSCULAR | Status: AC
  Filled 2017-05-14: qty 2

## 2017-05-14 MED ORDER — BSS IO SOLN
INTRAOCULAR | Status: DC | PRN
  Administered 2017-05-14: 15 mL via INTRAOCULAR

## 2017-05-14 SURGICAL SUPPLY — 12 items

## 2017-05-14 NOTE — Transfer of Care (Signed)
Immediate Anesthesia Transfer of Care Note  Patient: Courtney Orozco  Procedure(s) Performed: Procedure(s) with comments: CATARACT EXTRACTION PHACO AND INTRAOCULAR LENS PLACEMENT RIGHT EYE (Right) - CDE: 6.63  Patient Location: Short Stay  Anesthesia Type:MAC  Level of Consciousness: awake and patient cooperative  Airway & Oxygen Therapy: Patient Spontanous Breathing  Post-op Assessment: Report given to RN  Post vital signs: Reviewed and stable  Last Vitals: There were no vitals filed for this visit.  Last Pain: There were no vitals filed for this visit.       Complications: No apparent anesthesia complications

## 2017-05-14 NOTE — Anesthesia Preprocedure Evaluation (Signed)
Anesthesia Evaluation  Patient identified by MRN, date of birth, ID band Patient awake    Reviewed: Allergy & Precautions, H&P , NPO status , Patient's Chart, lab work & pertinent test results  History of Anesthesia Complications (+) PONV and history of anesthetic complications  Airway Mallampati: II  TM Distance: >3 FB Neck ROM: Full    Dental no notable dental hx. (+) Edentulous Upper, Edentulous Lower, Dental Advisory Given   Pulmonary neg pulmonary ROS, Current Smoker, former smoker,    Pulmonary exam normal breath sounds clear to auscultation       Cardiovascular hypertension, Pt. on medications  Rhythm:Regular Rate:Normal     Neuro/Psych  Headaches, PSYCHIATRIC DISORDERS Anxiety Depression    GI/Hepatic Neg liver ROS, GERD  Medicated and Controlled,(+) Hepatitis -  Endo/Other  diabetes, Type 2, Oral Hypoglycemic AgentsHypothyroidism   Renal/GU Renal diseasenegative Renal ROS  negative genitourinary   Musculoskeletal  (+) Arthritis , Osteoarthritis,    Abdominal   Peds  Hematology negative hematology ROS (+)   Anesthesia Other Findings   Reproductive/Obstetrics negative OB ROS                             Anesthesia Physical Anesthesia Plan  ASA: III  Anesthesia Plan: MAC   Post-op Pain Management:    Induction: Intravenous  PONV Risk Score and Plan:   Airway Management Planned: Nasal Cannula  Additional Equipment:   Intra-op Plan:   Post-operative Plan:   Informed Consent: I have reviewed the patients History and Physical, chart, labs and discussed the procedure including the risks, benefits and alternatives for the proposed anesthesia with the patient or authorized representative who has indicated his/her understanding and acceptance.     Plan Discussed with:   Anesthesia Plan Comments:         Anesthesia Quick Evaluation

## 2017-05-14 NOTE — Anesthesia Postprocedure Evaluation (Signed)
Anesthesia Post Note  Patient: Courtney Orozco  Procedure(s) Performed: Procedure(s) (LRB): CATARACT EXTRACTION PHACO AND INTRAOCULAR LENS PLACEMENT RIGHT EYE (Right)  Patient location during evaluation: Short Stay Anesthesia Type: MAC Level of consciousness: awake and alert and patient cooperative Pain management: pain level controlled Vital Signs Assessment: post-procedure vital signs reviewed and stable Respiratory status: spontaneous breathing, nonlabored ventilation and respiratory function stable Cardiovascular status: blood pressure returned to baseline Postop Assessment: no signs of nausea or vomiting Anesthetic complications: no     Last Vitals: There were no vitals filed for this visit.  Last Pain: There were no vitals filed for this visit.               [name]

## 2017-05-14 NOTE — H&P (Signed)
I have reviewed the H&P, the patient was re-examined, and I have identified no interval changes in medical condition and plan of care since the history and physical of record  

## 2017-05-14 NOTE — Op Note (Signed)
Date of Admission: 05/14/2017  Date of Surgery: 05/14/2017  Pre-Op Dx: Cataract Right  Eye  Post-Op Dx: Senile Nuclear Cataract  Right  Eye,  Dx Code H25.11  Surgeon: Tonny Branch, M.D.  Assistants: None  Anesthesia: Topical with MAC  Indications: Painless, progressive loss of vision with compromise of daily activities.  Surgery: Cataract Extraction with Intraocular lens Implant Right Eye  Discription: The patient had dilating drops and viscous lidocaine placed into the Right eye in the pre-op holding area. After transfer to the operating room, a time out was performed. The patient was then prepped and draped. Beginning with a 59m blade a paracentesis port was made at the surgeon's 2 o'clock position. The anterior chamber was then filled with 1% non-preserved lidocaine. This was followed by filling the anterior chamber with Provisc.  A 2.486mkeratome blade was used to make a clear corneal incision at the temporal limbus.  A bent cystatome needle was used to create a continuous tear capsulotomy. Hydrodissection was performed with balanced salt solution on a Fine canula. The lens nucleus was then removed using the phacoemulsification handpiece. Residual cortex was removed with the I&A handpiece. The anterior chamber and capsular bag were refilled with Provisc. A posterior chamber intraocular lens was placed into the capsular bag with it's injector. The implant was positioned with the Kuglan hook. The Provisc was then removed from the anterior chamber and capsular bag with the I&A handpiece. Stromal hydration of the main incision and paracentesis port was performed with BSS on a Fine canula. The wounds were tested for leak which was negative. The patient tolerated the procedure well. There were no operative complications. The patient was then transferred to the recovery room in stable condition.  Complications: None  Specimen: None  EBL: None  Prosthetic device: [employer] Technis, [id number], power 16.0,  SN [id number]

## 2017-05-15 ENCOUNTER — Encounter (HOSPITAL_COMMUNITY): Payer: Self-pay | Admitting: Ophthalmology

## 2017-06-18 DIAGNOSIS — Z961 Presence of intraocular lens: Secondary | ICD-10-CM | POA: Diagnosis not present

## 2017-06-19 DIAGNOSIS — R609 Edema, unspecified: Secondary | ICD-10-CM | POA: Diagnosis not present

## 2017-06-19 DIAGNOSIS — F329 Major depressive disorder, single episode, unspecified: Secondary | ICD-10-CM | POA: Diagnosis not present

## 2017-06-19 DIAGNOSIS — Z79899 Other long term (current) drug therapy: Secondary | ICD-10-CM | POA: Diagnosis not present

## 2017-06-19 DIAGNOSIS — F419 Anxiety disorder, unspecified: Secondary | ICD-10-CM | POA: Diagnosis not present

## 2017-06-19 DIAGNOSIS — G4733 Obstructive sleep apnea (adult) (pediatric): Secondary | ICD-10-CM | POA: Diagnosis not present

## 2017-07-19 DIAGNOSIS — E039 Hypothyroidism, unspecified: Secondary | ICD-10-CM | POA: Diagnosis not present

## 2017-07-19 DIAGNOSIS — N183 Chronic kidney disease, stage 3 (moderate): Secondary | ICD-10-CM | POA: Diagnosis not present

## 2017-07-19 DIAGNOSIS — Z79899 Other long term (current) drug therapy: Secondary | ICD-10-CM | POA: Diagnosis not present

## 2017-07-20 DIAGNOSIS — F419 Anxiety disorder, unspecified: Secondary | ICD-10-CM | POA: Diagnosis not present

## 2017-07-20 DIAGNOSIS — R2681 Unsteadiness on feet: Secondary | ICD-10-CM | POA: Diagnosis not present

## 2017-07-20 DIAGNOSIS — E039 Hypothyroidism, unspecified: Secondary | ICD-10-CM | POA: Diagnosis not present

## 2017-07-20 DIAGNOSIS — Z79899 Other long term (current) drug therapy: Secondary | ICD-10-CM | POA: Diagnosis not present

## 2017-07-20 DIAGNOSIS — F329 Major depressive disorder, single episode, unspecified: Secondary | ICD-10-CM | POA: Diagnosis not present

## 2017-07-20 DIAGNOSIS — Z23 Encounter for immunization: Secondary | ICD-10-CM | POA: Diagnosis not present

## 2017-07-20 DIAGNOSIS — I69393 Ataxia following cerebral infarction: Secondary | ICD-10-CM | POA: Diagnosis not present

## 2017-07-20 DIAGNOSIS — N183 Chronic kidney disease, stage 3 (moderate): Secondary | ICD-10-CM | POA: Diagnosis not present

## 2017-07-20 DIAGNOSIS — I10 Essential (primary) hypertension: Secondary | ICD-10-CM | POA: Diagnosis not present

## 2017-07-25 DIAGNOSIS — I10 Essential (primary) hypertension: Secondary | ICD-10-CM | POA: Diagnosis not present

## 2017-07-25 DIAGNOSIS — R2681 Unsteadiness on feet: Secondary | ICD-10-CM | POA: Diagnosis not present

## 2017-07-25 DIAGNOSIS — I639 Cerebral infarction, unspecified: Secondary | ICD-10-CM | POA: Diagnosis not present

## 2017-07-25 DIAGNOSIS — I69393 Ataxia following cerebral infarction: Secondary | ICD-10-CM | POA: Diagnosis not present

## 2017-08-03 DIAGNOSIS — I952 Hypotension due to drugs: Secondary | ICD-10-CM | POA: Diagnosis not present

## 2017-08-03 DIAGNOSIS — G459 Transient cerebral ischemic attack, unspecified: Secondary | ICD-10-CM | POA: Diagnosis not present

## 2017-08-15 DIAGNOSIS — R296 Repeated falls: Secondary | ICD-10-CM | POA: Insufficient documentation

## 2017-08-16 DIAGNOSIS — R296 Repeated falls: Secondary | ICD-10-CM | POA: Diagnosis not present

## 2017-08-16 DIAGNOSIS — I6789 Other cerebrovascular disease: Secondary | ICD-10-CM | POA: Diagnosis not present

## 2017-08-16 DIAGNOSIS — R2681 Unsteadiness on feet: Secondary | ICD-10-CM | POA: Diagnosis not present

## 2017-08-17 DIAGNOSIS — R262 Difficulty in walking, not elsewhere classified: Secondary | ICD-10-CM | POA: Diagnosis not present

## 2017-08-17 DIAGNOSIS — M6281 Muscle weakness (generalized): Secondary | ICD-10-CM | POA: Diagnosis not present

## 2017-08-27 IMAGING — DX DG CHEST 1V PORT
1 series · 1 of 1 positions shown · non-contrast
Comparison: 03/14/2015

CLINICAL DATA: Pain after fall

EXAM:
PORTABLE CHEST 1 VIEW

[chest ap]
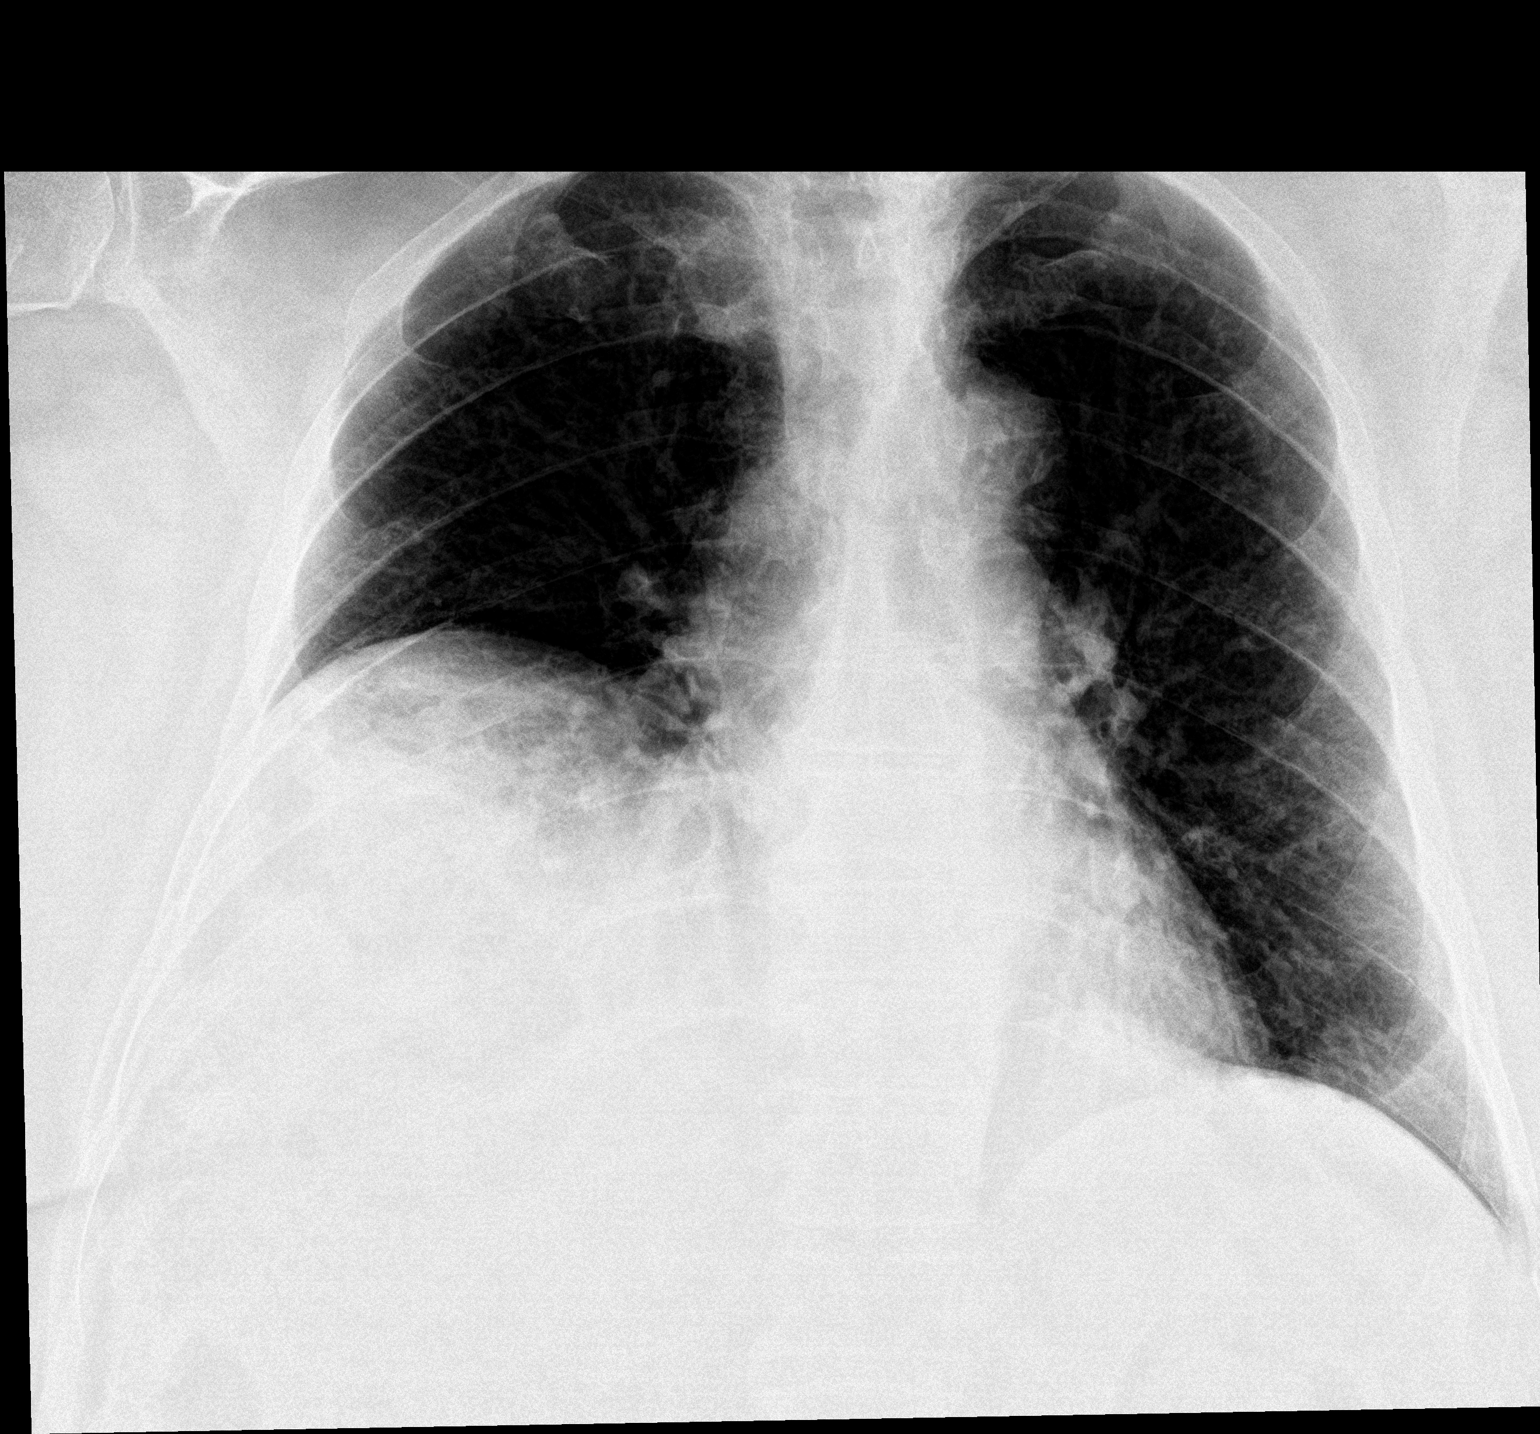

[1 of 1 positions shown; findings below may reference images not displayed]

FINDINGS: Chronic elevation of the right hemidiaphragm with colonic
interposition over the right hepatic shadow. Heart is normal in size
with aortic arch atherosclerosis. No pneumonic consolidation,
effusion or pneumothorax. No overt pulmonary edema. No suspicious
nor acute osseous abnormality.
IMPRESSION: 1. Aortic atherosclerosis.
2. Chronic elevation of the right hemidiaphragm with colonic
interposition.
3. No acute cardiopulmonary disease.

## 2017-08-27 IMAGING — DX DG HIP (WITH OR WITHOUT PELVIS) 2-3V*R*
3 series · 3 of 3 positions shown · non-contrast
Comparison: None.

CLINICAL DATA: Pain after fall

EXAM:
DG HIP (WITH OR WITHOUT PELVIS) 2-3V RIGHT

[pelvis ap]
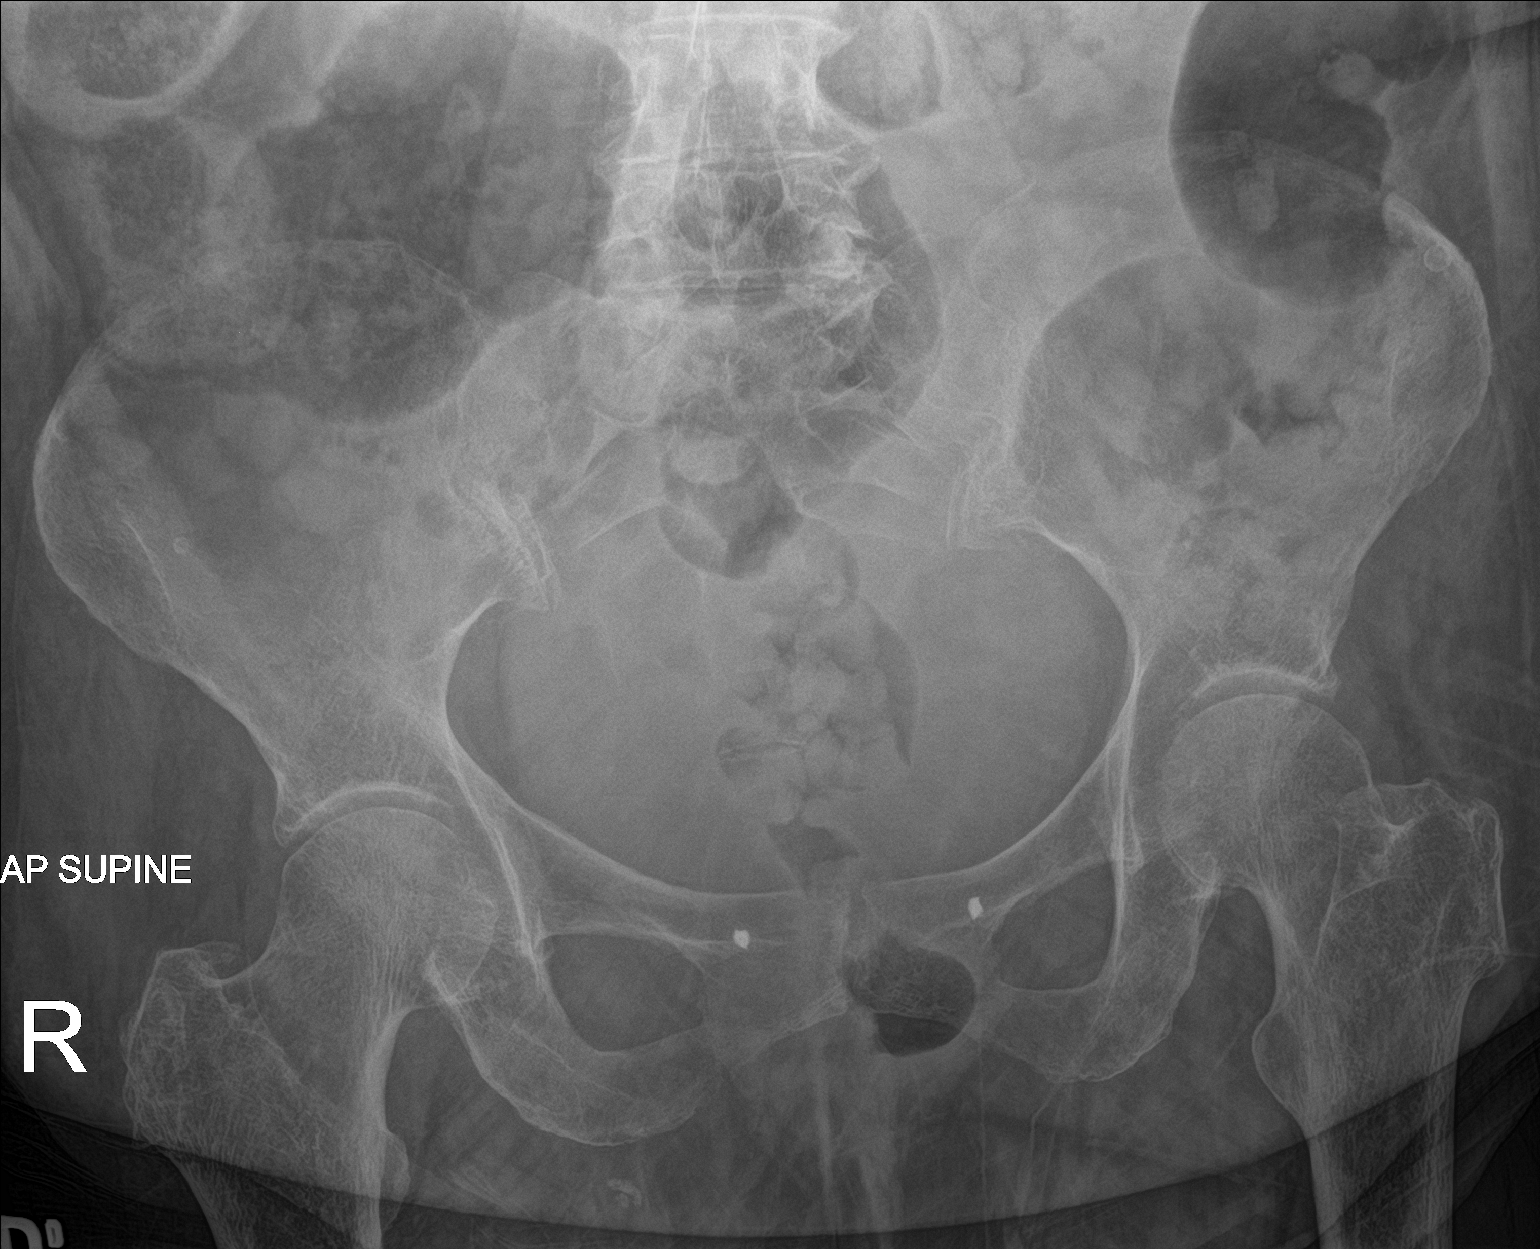

[hip ap]
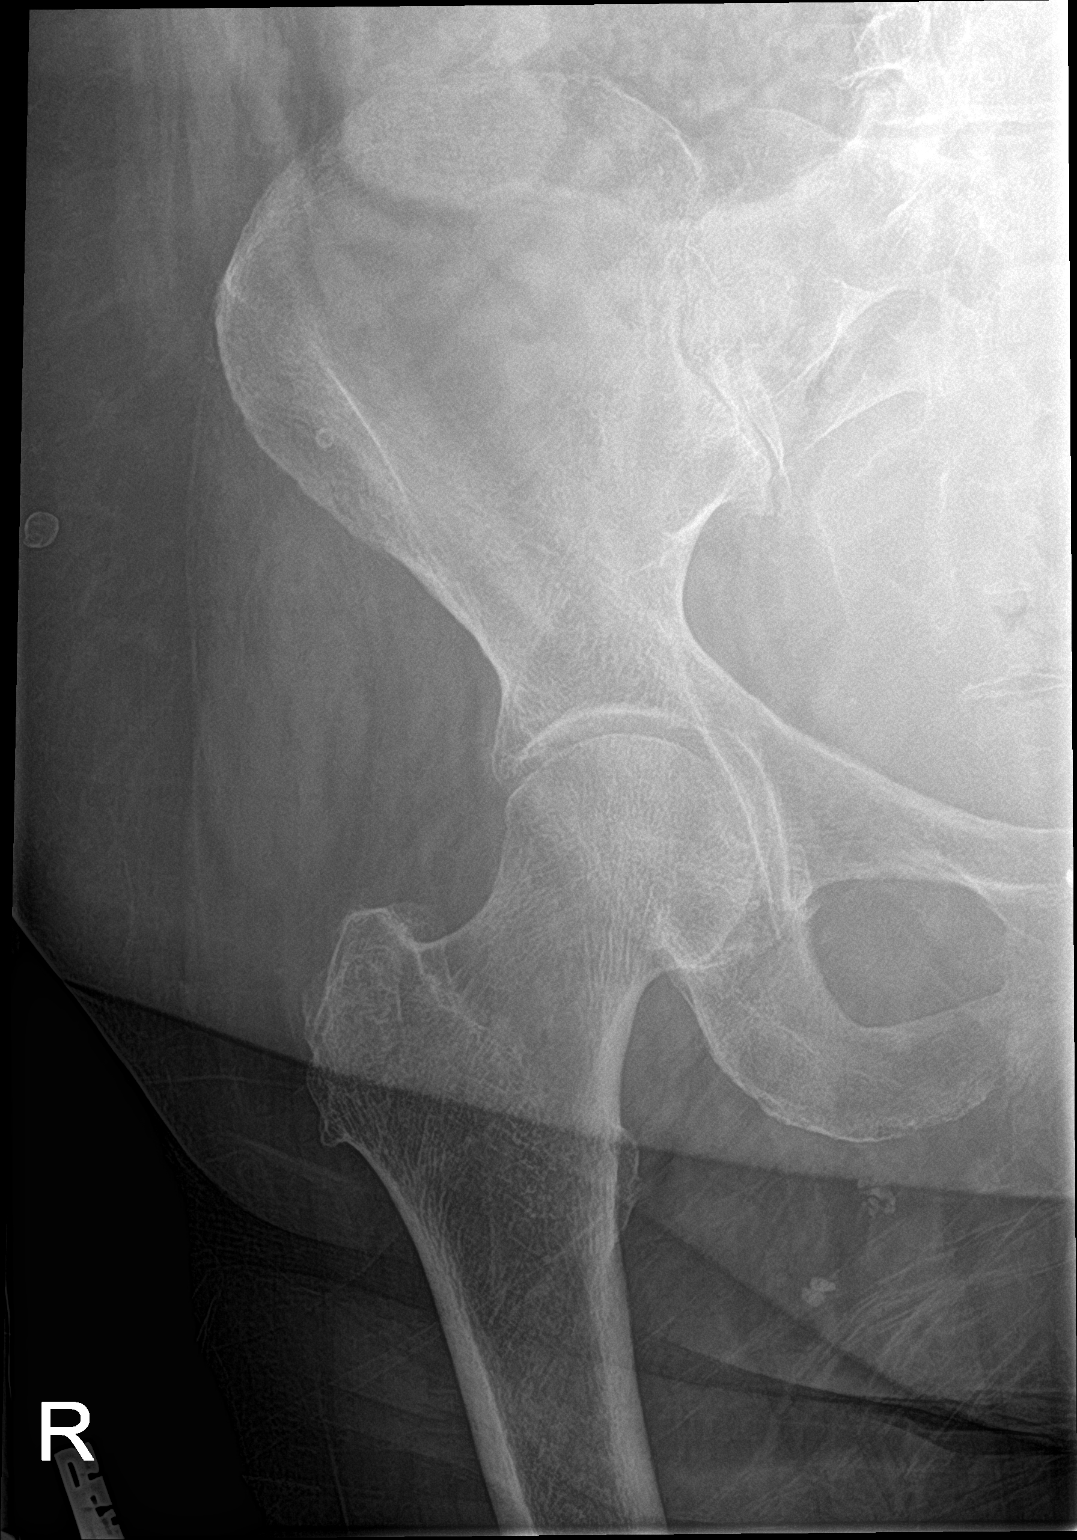

[hip lat]
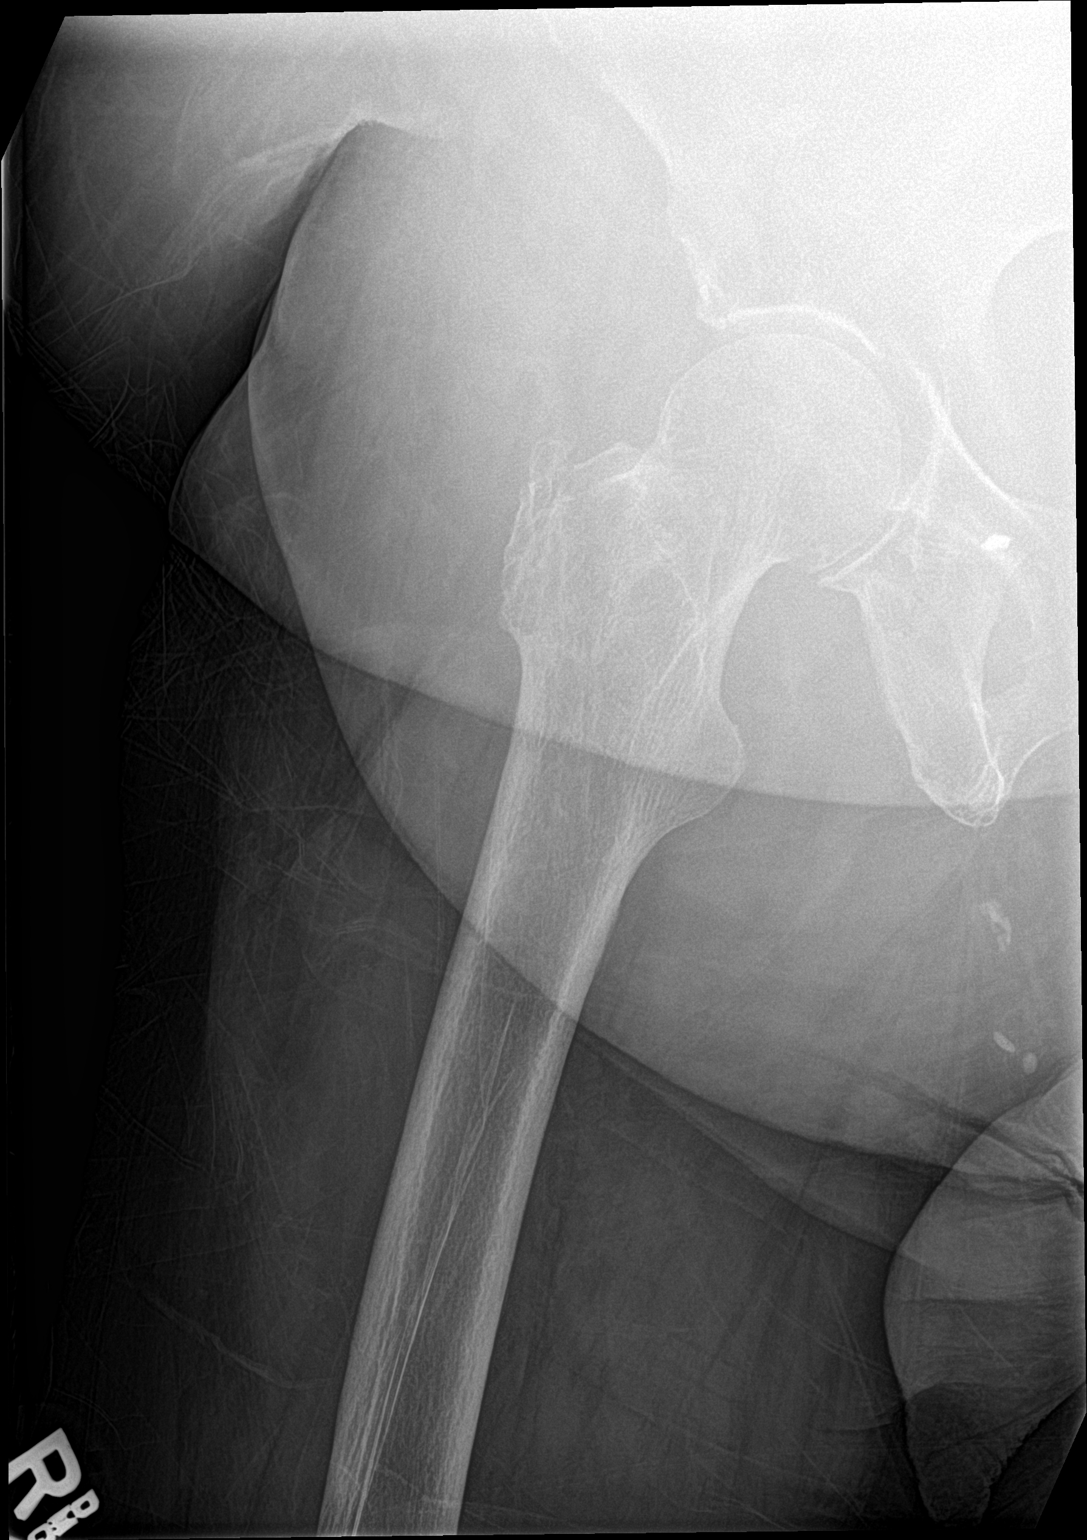

[3 of 3 positions shown; findings below may reference images not displayed]

FINDINGS: There is no evidence of hip fracture or dislocation. Lower lumbar
degenerative disc space narrowing from L4 through S1 with endplate
spurring. Sacroiliac joints are symmetric in appearance without
malalignment. Surgical tacks project over the pubic symphysis
bilaterally. No acute fracture of the proximal femora. Injection
granulomata project over the gluteal soft tissues.
IMPRESSION: Negative for acute fracture of the bony pelvis and hips. Lower
lumbar degenerative disc disease.

## 2017-09-14 DIAGNOSIS — G459 Transient cerebral ischemic attack, unspecified: Secondary | ICD-10-CM | POA: Diagnosis not present

## 2017-09-14 DIAGNOSIS — F418 Other specified anxiety disorders: Secondary | ICD-10-CM | POA: Diagnosis not present

## 2017-09-14 DIAGNOSIS — R2681 Unsteadiness on feet: Secondary | ICD-10-CM | POA: Diagnosis not present

## 2017-09-14 DIAGNOSIS — M545 Low back pain: Secondary | ICD-10-CM | POA: Diagnosis not present

## 2017-09-14 DIAGNOSIS — Z79899 Other long term (current) drug therapy: Secondary | ICD-10-CM | POA: Diagnosis not present

## 2017-09-14 DIAGNOSIS — G8929 Other chronic pain: Secondary | ICD-10-CM | POA: Diagnosis not present

## 2017-09-28 ENCOUNTER — Inpatient Hospital Stay (HOSPITAL_COMMUNITY)
Admission: EM | Admit: 2017-09-28 | Discharge: 2017-09-29 | DRG: 854 | Disposition: A | Discharge status: Home / Self Care | Payer: PPO | Attending: Family Medicine | Admitting: Family Medicine

## 2017-09-28 ENCOUNTER — Emergency Department (HOSPITAL_COMMUNITY): Payer: PPO

## 2017-09-28 ENCOUNTER — Inpatient Hospital Stay (HOSPITAL_COMMUNITY): Payer: PPO | Admitting: Anesthesiology

## 2017-09-28 ENCOUNTER — Encounter (HOSPITAL_COMMUNITY)
Admission: EM | Disposition: A | Discharge status: Home / Self Care | Payer: Self-pay | Source: Home / Self Care | Attending: Family Medicine

## 2017-09-28 ENCOUNTER — Encounter (HOSPITAL_COMMUNITY): Payer: Self-pay | Admitting: *Deleted

## 2017-09-28 DIAGNOSIS — E119 Type 2 diabetes mellitus without complications: Secondary | ICD-10-CM | POA: Diagnosis not present

## 2017-09-28 DIAGNOSIS — E039 Hypothyroidism, unspecified: Secondary | ICD-10-CM | POA: Diagnosis not present

## 2017-09-28 DIAGNOSIS — K219 Gastro-esophageal reflux disease without esophagitis: Secondary | ICD-10-CM | POA: Diagnosis present

## 2017-09-28 DIAGNOSIS — F17210 Nicotine dependence, cigarettes, uncomplicated: Secondary | ICD-10-CM | POA: Diagnosis not present

## 2017-09-28 DIAGNOSIS — I10 Essential (primary) hypertension: Secondary | ICD-10-CM | POA: Diagnosis present

## 2017-09-28 DIAGNOSIS — N10 Acute tubulo-interstitial nephritis: Secondary | ICD-10-CM

## 2017-09-28 DIAGNOSIS — Z880 Allergy status to penicillin: Secondary | ICD-10-CM | POA: Diagnosis not present

## 2017-09-28 DIAGNOSIS — I714 Abdominal aortic aneurysm, without rupture: Secondary | ICD-10-CM | POA: Diagnosis present

## 2017-09-28 DIAGNOSIS — R109 Unspecified abdominal pain: Secondary | ICD-10-CM | POA: Diagnosis not present

## 2017-09-28 DIAGNOSIS — Z79899 Other long term (current) drug therapy: Secondary | ICD-10-CM

## 2017-09-28 DIAGNOSIS — Z90710 Acquired absence of both cervix and uterus: Secondary | ICD-10-CM

## 2017-09-28 DIAGNOSIS — Z961 Presence of intraocular lens: Secondary | ICD-10-CM | POA: Diagnosis present

## 2017-09-28 DIAGNOSIS — R10812 Left upper quadrant abdominal tenderness: Secondary | ICD-10-CM | POA: Diagnosis not present

## 2017-09-28 DIAGNOSIS — Z7982 Long term (current) use of aspirin: Secondary | ICD-10-CM

## 2017-09-28 DIAGNOSIS — N3001 Acute cystitis with hematuria: Secondary | ICD-10-CM

## 2017-09-28 DIAGNOSIS — F419 Anxiety disorder, unspecified: Secondary | ICD-10-CM | POA: Diagnosis present

## 2017-09-28 DIAGNOSIS — A419 Sepsis, unspecified organism: Secondary | ICD-10-CM | POA: Diagnosis not present

## 2017-09-28 DIAGNOSIS — D72829 Elevated white blood cell count, unspecified: Secondary | ICD-10-CM | POA: Diagnosis not present

## 2017-09-28 DIAGNOSIS — N201 Calculus of ureter: Secondary | ICD-10-CM

## 2017-09-28 DIAGNOSIS — N132 Hydronephrosis with renal and ureteral calculous obstruction: Secondary | ICD-10-CM | POA: Diagnosis not present

## 2017-09-28 DIAGNOSIS — F329 Major depressive disorder, single episode, unspecified: Secondary | ICD-10-CM | POA: Diagnosis not present

## 2017-09-28 DIAGNOSIS — Z87442 Personal history of urinary calculi: Secondary | ICD-10-CM

## 2017-09-28 DIAGNOSIS — Z960 Presence of urogenital implants: Secondary | ICD-10-CM | POA: Diagnosis present

## 2017-09-28 DIAGNOSIS — N111 Chronic obstructive pyelonephritis: Secondary | ICD-10-CM | POA: Diagnosis present

## 2017-09-28 DIAGNOSIS — J9811 Atelectasis: Secondary | ICD-10-CM | POA: Diagnosis not present

## 2017-09-28 DIAGNOSIS — N390 Urinary tract infection, site not specified: Secondary | ICD-10-CM | POA: Diagnosis present

## 2017-09-28 DIAGNOSIS — Z881 Allergy status to other antibiotic agents status: Secondary | ICD-10-CM

## 2017-09-28 DIAGNOSIS — R1031 Right lower quadrant pain: Secondary | ICD-10-CM | POA: Diagnosis not present

## 2017-09-28 DIAGNOSIS — Z79890 Hormone replacement therapy (postmenopausal): Secondary | ICD-10-CM

## 2017-09-28 DIAGNOSIS — K2970 Gastritis, unspecified, without bleeding: Secondary | ICD-10-CM | POA: Diagnosis not present

## 2017-09-28 DIAGNOSIS — R509 Fever, unspecified: Secondary | ICD-10-CM | POA: Diagnosis not present

## 2017-09-28 HISTORY — PX: CYSTOSCOPY WITH STENT PLACEMENT: SHX5790

## 2017-09-28 LAB — CBC WITH DIFFERENTIAL/PLATELET
BASOS ABS: 0 10*3/uL (ref 0.0–0.1)
Basophils Relative: 0 %
EOS PCT: 0 %
Eosinophils Absolute: 0 10*3/uL (ref 0.0–0.7)
HCT: 43.5 % (ref 36.0–46.0)
Hemoglobin: 14.1 g/dL (ref 12.0–15.0)
LYMPHS PCT: 6 %
Lymphs Abs: 1.1 10*3/uL (ref 0.7–4.0)
MCH: 30.7 pg (ref 26.0–34.0)
MCHC: 32.4 g/dL (ref 30.0–36.0)
MCV: 94.6 fL (ref 78.0–100.0)
MONO ABS: 1.3 10*3/uL — AB (ref 0.1–1.0)
Monocytes Relative: 7 %
Neutro Abs: 15.2 10*3/uL — ABNORMAL HIGH (ref 1.7–7.7)
Neutrophils Relative %: 87 %
PLATELETS: 230 10*3/uL (ref 150–400)
RBC: 4.6 MIL/uL (ref 3.87–5.11)
RDW: 13.2 % (ref 11.5–15.5)
WBC: 17.7 10*3/uL — ABNORMAL HIGH (ref 4.0–10.5)

## 2017-09-28 LAB — COMPREHENSIVE METABOLIC PANEL
ALK PHOS: 95 U/L (ref 38–126)
ALT: 18 U/L (ref 14–54)
AST: 20 U/L (ref 15–41)
Albumin: 3.7 g/dL (ref 3.5–5.0)
Anion gap: 10 (ref 5–15)
BILIRUBIN TOTAL: 0.8 mg/dL (ref 0.3–1.2)
BUN: 14 mg/dL (ref 6–20)
CALCIUM: 9.1 mg/dL (ref 8.9–10.3)
CO2: 18 mmol/L — AB (ref 22–32)
CREATININE: 1 mg/dL (ref 0.44–1.00)
Chloride: 109 mmol/L (ref 101–111)
GFR, EST NON AFRICAN AMERICAN: 57 mL/min — AB (ref >60)
Glucose, Bld: 141 mg/dL — ABNORMAL HIGH (ref 65–99)
Potassium: 3.5 mmol/L (ref 3.5–5.1)
SODIUM: 137 mmol/L (ref 135–145)
Total Protein: 7 g/dL (ref 6.5–8.1)

## 2017-09-28 LAB — URINALYSIS, ROUTINE W REFLEX MICROSCOPIC
Bilirubin Urine: NEGATIVE
GLUCOSE, UA: NEGATIVE mg/dL
Ketones, ur: NEGATIVE mg/dL
Nitrite: NEGATIVE
PH: 7 (ref 5.0–8.0)
Protein, ur: 30 mg/dL — AB
SPECIFIC GRAVITY, URINE: 1.012 (ref 1.005–1.030)

## 2017-09-28 LAB — I-STAT CG4 LACTIC ACID, ED
Lactic Acid, Venous: 2.23 mmol/L (ref 0.5–1.9)
Lactic Acid, Venous: 1.98 mmol/L — ABNORMAL HIGH (ref 0.5–1.9)

## 2017-09-28 LAB — PROTIME-INR
INR: 1.06
Prothrombin Time: 13.7 seconds (ref 11.4–15.2)

## 2017-09-28 LAB — INFLUENZA PANEL BY PCR (TYPE A & B)
INFLAPCR: NEGATIVE
INFLBPCR: NEGATIVE

## 2017-09-28 SURGERY — CYSTOSCOPY, WITH STENT INSERTION
Anesthesia: Monitor Anesthesia Care | Laterality: Right

## 2017-09-28 MED ORDER — ONDANSETRON HCL 4 MG/2ML IJ SOLN
INTRAMUSCULAR | Status: DC | PRN
Start: 2017-09-28 — End: 2017-09-29
  Administered 2017-09-28: 4 mg via INTRAVENOUS

## 2017-09-28 MED ORDER — LIDOCAINE HCL (PF) 1 % IJ SOLN
INTRAMUSCULAR | Status: AC
  Filled 2017-09-28: qty 5

## 2017-09-28 MED ORDER — SODIUM CHLORIDE 0.9 % IR SOLN
Status: DC | PRN
  Administered 2017-09-28: 3000 mL

## 2017-09-28 MED ORDER — MIDAZOLAM HCL 2 MG/2ML IJ SOLN
INTRAMUSCULAR | Status: AC
  Filled 2017-09-28: qty 2

## 2017-09-28 MED ORDER — PROPOFOL 500 MG/50ML IV EMUL
INTRAVENOUS | Status: DC | PRN
Start: 2017-09-28 — End: 2017-09-29
  Administered 2017-09-28: 100 ug/kg/min via INTRAVENOUS

## 2017-09-28 MED ORDER — DEXTROSE 5 % IV SOLN
1.0000 g | Freq: Once | INTRAVENOUS | Status: DC
  Filled 2017-09-28: qty 1

## 2017-09-28 MED ORDER — INSULIN ASPART 100 UNIT/ML ~~LOC~~ SOLN
0.0000 [IU] | SUBCUTANEOUS | Status: DC
  Administered 2017-09-29: 1 [IU] via SUBCUTANEOUS

## 2017-09-28 MED ORDER — AZTREONAM 2 G IJ SOLR
2.0000 g | Freq: Once | INTRAMUSCULAR | Status: AC
  Administered 2017-09-28: 2 g via INTRAVENOUS
  Filled 2017-09-28: qty 2

## 2017-09-28 MED ORDER — SODIUM CHLORIDE 0.9 % IV SOLN
INTRAVENOUS | Status: DC | PRN
  Administered 2017-09-28: 23:00:00 via INTRAVENOUS

## 2017-09-28 MED ORDER — SODIUM CHLORIDE 0.9 % IV BOLUS (SEPSIS)
1000.0000 mL | Freq: Once | INTRAVENOUS | Status: AC
  Administered 2017-09-28: 1000 mL via INTRAVENOUS

## 2017-09-28 MED ORDER — MIDAZOLAM HCL 5 MG/5ML IJ SOLN
INTRAMUSCULAR | Status: DC | PRN
Start: 2017-09-28 — End: 2017-09-29
  Administered 2017-09-28: 2 mg via INTRAVENOUS

## 2017-09-28 MED ORDER — SUCCINYLCHOLINE CHLORIDE 20 MG/ML IJ SOLN
INTRAMUSCULAR | Status: AC
  Filled 2017-09-28: qty 1

## 2017-09-28 MED ORDER — PHENYLEPHRINE 40 MCG/ML (10ML) SYRINGE FOR IV PUSH (FOR BLOOD PRESSURE SUPPORT)
PREFILLED_SYRINGE | INTRAVENOUS | Status: AC
Start: 2017-09-28 — End: ?
  Filled 2017-09-28: qty 10

## 2017-09-28 MED ORDER — ONDANSETRON HCL 4 MG/2ML IJ SOLN
INTRAMUSCULAR | Status: AC
  Filled 2017-09-28: qty 2

## 2017-09-28 MED ORDER — SODIUM CHLORIDE 0.9 % IJ SOLN
INTRAMUSCULAR | Status: AC
  Filled 2017-09-28: qty 10

## 2017-09-28 MED ORDER — EPHEDRINE SULFATE 50 MG/ML IJ SOLN
INTRAMUSCULAR | Status: AC
  Filled 2017-09-28: qty 1

## 2017-09-28 MED ORDER — LEVOFLOXACIN IN D5W 750 MG/150ML IV SOLN
750.0000 mg | Freq: Once | INTRAVENOUS | Status: AC
  Administered 2017-09-28: 750 mg via INTRAVENOUS
  Filled 2017-09-28: qty 150

## 2017-09-28 MED ORDER — ACETAMINOPHEN 500 MG PO TABS
1000.0000 mg | ORAL_TABLET | Freq: Once | ORAL | Status: AC
  Administered 2017-09-28: 1000 mg via ORAL
  Filled 2017-09-28: qty 2

## 2017-09-28 MED ORDER — VANCOMYCIN HCL IN DEXTROSE 1-5 GM/200ML-% IV SOLN
1000.0000 mg | Freq: Once | INTRAVENOUS | Status: AC
  Administered 2017-09-28: 1000 mg via INTRAVENOUS
  Filled 2017-09-28: qty 200

## 2017-09-28 MED ORDER — IOPAMIDOL (ISOVUE-300) INJECTION 61%
100.0000 mL | Freq: Once | INTRAVENOUS | Status: AC | PRN
  Administered 2017-09-28: 100 mL via INTRAVENOUS

## 2017-09-28 MED ORDER — FENTANYL CITRATE (PF) 100 MCG/2ML IJ SOLN
INTRAMUSCULAR | Status: AC
  Filled 2017-09-28: qty 2

## 2017-09-28 MED ORDER — PROPOFOL 10 MG/ML IV BOLUS
INTRAVENOUS | Status: AC
  Filled 2017-09-28: qty 40

## 2017-09-28 SURGICAL SUPPLY — 21 items
BAG DRAIN URO TABLE W/ADPT NS (DRAPE) ×3 IMPLANT
BAG DRN 8 ADPR NS SKTRN CSTL (DRAPE) ×1
BAG HAMPER (MISCELLANEOUS) ×3 IMPLANT
CATH INTERMIT  6FR 70CM (CATHETERS) ×3 IMPLANT
CLOTH BEACON ORANGE TIMEOUT ST (SAFETY) ×3 IMPLANT
GLOVE BIOGEL M 6.5 STRL (GLOVE) ×2 IMPLANT
GLOVE BIOGEL M 8.0 STRL (GLOVE) ×3 IMPLANT
GLOVE BIOGEL PI IND STRL 6.5 (GLOVE) IMPLANT
GLOVE BIOGEL PI INDICATOR 6.5 (GLOVE) ×2
GOWN STRL REUS W/ TWL LRG LVL3 (GOWN DISPOSABLE) ×1 IMPLANT
GOWN STRL REUS W/TWL LRG LVL3 (GOWN DISPOSABLE) ×3
GOWN STRL REUS W/TWL XL LVL3 (GOWN DISPOSABLE) ×3 IMPLANT
GUIDEWIRE STR DUAL SENSOR (WIRE) ×2 IMPLANT
KIT ROOM TURNOVER AP CYSTO (KITS) ×3 IMPLANT
MANIFOLD NEPTUNE II (INSTRUMENTS) ×3 IMPLANT
NS IRRIG 500ML POUR BTL (IV SOLUTION) IMPLANT
PACK CYSTO (CUSTOM PROCEDURE TRAY) ×3 IMPLANT
PAD ARMBOARD 7.5X6 YLW CONV (MISCELLANEOUS) ×3 IMPLANT
SENSOR TEMP SKIN DISP (MISCELLANEOUS) ×2 IMPLANT
STENT URET 6FRX24 CONTOUR (STENTS) ×2 IMPLANT
TOWEL OR 17X26 4PK STRL BLUE (TOWEL DISPOSABLE) ×3 IMPLANT

## 2017-09-28 NOTE — ED Notes (Signed)
ED Provider at bedside for update 

## 2017-09-28 NOTE — ED Notes (Signed)
Report attempted, heather to call back

## 2017-09-28 NOTE — ED Notes (Signed)
Pt returning from radiology now

## 2017-09-28 NOTE — Consult Note (Signed)
Urology Consult   Physician requesting consult: Tyrone Schimke, MD  Reason for consult: Infected right ureteral stone  History of Present Illness: Courtney Orozco is a 66 y.o. female who presented to the emergency room earlier this evening with a 2-3-week history of feeling poorly, recent change in mental status, fever and abdominal pain.  Evaluation revealed infected urine.  CT scan revealed an obstructed right ureteral stone with moderate hydroureteronephrosis on the right.  Urologic consultation is requested for urgent management of this infected, obstructing right ureteral stone.  She does have a history of prior kidney stone, treated by Dr. Irine Seal of our practice about 2 years ago.  She denies a history of voiding or storage urinary symptoms, hematuria, UTIs, STDs, urolithiasis, GU malignancy/trauma/surgery.  Past Medical History:  Diagnosis Date  . Anxiety   . Arthritis   . Back pain   . Depression   . Diabetes mellitus without complication ([place])    no meds in 5 years   . GERD (gastroesophageal reflux disease)   . Headache    hx of migraines   . Hepatitis    hx of hep B - 44 years ago   . Hypertension   . Hypothyroidism   . Pneumonia    hx of 01/2015   . PONV (postoperative nausea and vomiting)   . Thyroid disease     Past Surgical History:  Procedure Laterality Date  . ABDOMINAL HYSTERECTOMY    . APPENDECTOMY    . BACK SURGERY     x 4  . bladder tack surgery     . BREAST SURGERY    . CATARACT EXTRACTION W/PHACO Left 04/30/2017   Procedure: CATARACT EXTRACTION PHACO AND INTRAOCULAR LENS PLACEMENT (IOC);  Surgeon: Tonny Branch, MD;  Location: [facility];  Service: Ophthalmology;  Laterality: Left;  CDE: 7.16  . CATARACT EXTRACTION W/PHACO Right 05/14/2017   Procedure: CATARACT EXTRACTION PHACO AND INTRAOCULAR LENS PLACEMENT RIGHT EYE;  Surgeon: Tonny Branch, MD;  Location: [facility];  Service: Ophthalmology;  Laterality: Right;  CDE: 6.63  . CYSTOSCOPY WITH URETEROSCOPY, STONE BASKETRY  AND STENT PLACEMENT Right 04/06/2015   Procedure: CYSTOSCOPY WITH RIGHT URETEROSCOPY, STONE BASKETRY AND STENT PLACEMENT;  Surgeon: Irine Seal, MD;  Location: [facility];  Service: Urology;  Laterality: Right;  . HOLMIUM LASER APPLICATION Right 4/62/7035   Procedure: HOLMIUM LASER APPLICATION;  Surgeon: Irine Seal, MD;  Location: [facility];  Service: Urology;  Laterality: Right;  . KNEE SURGERY     left  . ROTATOR CUFF REPAIR    . thumb surgery     left     Current Hospital Medications: Scheduled Meds: Continuous Infusions: . levofloxacin (LEVAQUIN) IV 750 mg (09/28/17 2121)   PRN Meds:.  Allergies:  Allergies  Allergen Reactions  . Ciprofloxacin Hives  . Azithromycin Itching and Swelling  . Penicillins Swelling    Has patient had a PCN reaction causing immediate rash, facial/tongue/throat swelling, SOB or lightheadedness with hypotension: Yes Has patient had a PCN reaction causing severe rash involving mucus membranes or skin necrosis: No Has patient had a PCN reaction that required hospitalization No Has patient had a PCN reaction occurring within the last 10 years: No If all of the above answers are "NO", then may proceed with Cephalosporin use.   . Sulfa Antibiotics Itching    No family history on file.  Social History:  reports that she has been smoking cigarettes.  She has been smoking about 0.50 packs per day. she has never used  smokeless tobacco. She reports that she does not drink alcohol or use drugs.  ROS: A complete review of systems was performed.  All systems are negative except for pertinent findings as noted.  Physical Exam:  Vital signs in last 24 hours: Temp:  [98.2 F (36.8 C)-102.8 F (39.3 C)] 98.2 F (36.8 C) (12/07 2124) Pulse Rate:  [96-108] 96 (12/07 2200) Resp:  [21-30] 24 (12/07 2200) BP: (121-130)/(42-59) 123/49 (12/07 2200) SpO2:  [90 %-95 %] 93 % (12/07 2200) Weight:  [95.3 kg (210 lb)] 95.3 kg (210 lb) (12/07 2009) General:  Alert and  oriented, mild distress.  Obese HEENT: Normocephalic, atraumatic.  Alopecia Neck: No JVD or lymphadenopathy Cardiovascular: Regular rate  Lungs: Normal inspiratory and expiratory excursion Abdomen: Obese.  No masses.  Right lower quadrant and right CVA tenderness.  No rebound or guarding. Extremities: No edema Neurologic: Grossly intact  Laboratory Data:  Recent Labs    09/28/17 2000  WBC 17.7*  HGB 14.1  HCT 43.5  PLT 230    Recent Labs    09/28/17 2000  NA 137  K 3.5  CL 109  GLUCOSE 141*  BUN 14  CALCIUM 9.1  CREATININE 1.00     Results for orders placed or performed during the hospital encounter of 09/28/17 (from the past 24 hour(s))  Comprehensive metabolic panel     Status: Abnormal   Collection Time: 09/28/17  8:00 PM  Result Value Ref Range   Sodium 137 135 - 145 mmol/L   Potassium 3.5 3.5 - 5.1 mmol/L   Chloride 109 101 - 111 mmol/L   CO2 18 (L) 22 - 32 mmol/L   Glucose, Bld 141 (H) 65 - 99 mg/dL   BUN 14 6 - 20 mg/dL   Creatinine, Ser 1.00 0.44 - 1.00 mg/dL   Calcium 9.1 8.9 - 10.3 mg/dL   Total Protein 7.0 6.5 - 8.1 g/dL   Albumin 3.7 3.5 - 5.0 g/dL   AST 20 15 - 41 U/L   ALT 18 14 - 54 U/L   Alkaline Phosphatase 95 38 - 126 U/L   Total Bilirubin 0.8 0.3 - 1.2 mg/dL   GFR calc non Af Amer 57 (L) >60 mL/min   GFR calc Af Amer >60 >60 mL/min   Anion gap 10 5 - 15  CBC with Differential     Status: Abnormal   Collection Time: 09/28/17  8:00 PM  Result Value Ref Range   WBC 17.7 (H) 4.0 - 10.5 K/uL   RBC 4.60 3.87 - 5.11 MIL/uL   Hemoglobin 14.1 12.0 - 15.0 g/dL   HCT 43.5 36.0 - 46.0 %   MCV 94.6 78.0 - 100.0 fL   MCH 30.7 26.0 - 34.0 pg   MCHC 32.4 30.0 - 36.0 g/dL   RDW 13.2 11.5 - 15.5 %   Platelets 230 150 - 400 K/uL   Neutrophils Relative % 87 %   Neutro Abs 15.2 (H) 1.7 - 7.7 K/uL   Lymphocytes Relative 6 %   Lymphs Abs 1.1 0.7 - 4.0 K/uL   Monocytes Relative 7 %   Monocytes Absolute 1.3 (H) 0.1 - 1.0 K/uL   Eosinophils Relative 0 %    Eosinophils Absolute 0.0 0.0 - 0.7 K/uL   Basophils Relative 0 %   Basophils Absolute 0.0 0.0 - 0.1 K/uL  Protime-INR     Status: None   Collection Time: 09/28/17  8:00 PM  Result Value Ref Range   Prothrombin Time 13.7 11.4 - 15.2  seconds   INR 1.06   Culture, blood (Routine x 2)     Status: None (Preliminary result)   Collection Time: 09/28/17  8:22 PM  Result Value Ref Range   Specimen Description BLOOD RIGHT HAND    Special Requests      BOTTLES DRAWN AEROBIC ONLY Blood Culture results may not be optimal due to an excessive volume of blood received in culture bottles   Culture PENDING    Report Status PENDING   Culture, blood (Routine x 2)     Status: None (Preliminary result)   Collection Time: 09/28/17  8:28 PM  Result Value Ref Range   Specimen Description BLOOD RIGHT ARM    Special Requests      BOTTLES DRAWN AEROBIC ONLY Blood Culture results may not be optimal due to an excessive volume of blood received in culture bottles   Culture PENDING    Report Status PENDING   Urinalysis, Routine w reflex microscopic     Status: Abnormal   Collection Time: 09/28/17  8:30 PM  Result Value Ref Range   Color, Urine YELLOW YELLOW   APPearance HAZY (A) CLEAR   Specific Gravity, Urine 1.012 1.005 - 1.030   pH 7.0 5.0 - 8.0   Glucose, UA NEGATIVE NEGATIVE mg/dL   Hgb urine dipstick LARGE (A) NEGATIVE   Bilirubin Urine NEGATIVE NEGATIVE   Ketones, ur NEGATIVE NEGATIVE mg/dL   Protein, ur 30 (A) NEGATIVE mg/dL   Nitrite NEGATIVE NEGATIVE   Leukocytes, UA SMALL (A) NEGATIVE   RBC / HPF TOO NUMEROUS TO COUNT 0 - 5 RBC/hpf   WBC, UA TOO NUMEROUS TO COUNT 0 - 5 WBC/hpf   Bacteria, UA RARE (A) NONE SEEN   Squamous Epithelial / LPF 0-5 (A) NONE SEEN   Mucus PRESENT   I-Stat CG4 Lactic Acid, ED     Status: Abnormal   Collection Time: 09/28/17  8:39 PM  Result Value Ref Range   Lactic Acid, Venous 2.23 (HH) 0.5 - 1.9 mmol/L   Comment NOTIFIED PHYSICIAN   Influenza panel by PCR (type  A & B)     Status: None   Collection Time: 09/28/17  8:45 PM  Result Value Ref Range   Influenza A By PCR NEGATIVE NEGATIVE   Influenza B By PCR NEGATIVE NEGATIVE  I-Stat CG4 Lactic Acid, ED     Status: Abnormal   Collection Time: 09/28/17 10:16 PM  Result Value Ref Range   Lactic Acid, Venous 1.98 (H) 0.5 - 1.9 mmol/L   Recent Results (from the past 240 hour(s))  Culture, blood (Routine x 2)     Status: None (Preliminary result)   Collection Time: 09/28/17  8:22 PM  Result Value Ref Range Status   Specimen Description BLOOD RIGHT HAND  Final   Special Requests   Final    BOTTLES DRAWN AEROBIC ONLY Blood Culture results may not be optimal due to an excessive volume of blood received in culture bottles   Culture PENDING  Incomplete   Report Status PENDING  Incomplete  Culture, blood (Routine x 2)     Status: None (Preliminary result)   Collection Time: 09/28/17  8:28 PM  Result Value Ref Range Status   Specimen Description BLOOD RIGHT ARM  Final   Special Requests   Final    BOTTLES DRAWN AEROBIC ONLY Blood Culture results may not be optimal due to an excessive volume of blood received in culture bottles   Culture PENDING  Incomplete   Report  Status PENDING  Incomplete    Renal Function: Recent Labs    09/28/17 2000  CREATININE 1.00   Estimated Creatinine Clearance: 61.1 mL/min (by C-G formula based on SCr of 1 mg/dL).  Radiologic Imaging: Dg Chest 2 View  Result Date: 09/28/2017 CLINICAL DATA:  Pain and dysuria.  Fever. EXAM: CHEST  2 VIEW COMPARISON:  January 22, 2017 FINDINGS: There is elevation of the right hemidiaphragm with right base atelectasis. There is no edema or consolidation. The heart size and pulmonary vascularity are normal. No adenopathy. No evident bone lesions. There is colonic interposition between the right hemidiaphragm and liver. IMPRESSION: Elevation of right hemidiaphragm with right base atelectasis. No edema or consolidation. Stable cardiac silhouette.  Electronically Signed   By: Lowella Grip III M.D.   On: 09/28/2017 21:08   Ct Abdomen Pelvis W Contrast  Result Date: 09/28/2017 CLINICAL DATA:  66 year old female with generalized weakness, fever and lower abdominal pain. Difficulty urinating. EXAM: CT ABDOMEN AND PELVIS WITH CONTRAST TECHNIQUE: Multidetector CT imaging of the abdomen and pelvis was performed using the standard protocol following bolus administration of intravenous contrast. CONTRAST:  145mL ISOVUE-300 IOPAMIDOL (ISOVUE-300) INJECTION 61% COMPARISON:  03/14/2015 FINDINGS: Lower chest: Normal heart size with coronary arteriosclerosis. No pericardial effusion. Chronic elevation/eventration of the right hemidiaphragm. Hepatobiliary: The liver surface appears slightly nodular consistent morphologically with cirrhosis. No biliary dilatation. There is gallbladder distention with tiny layering calculi. No wall thickening is identified. There is colonic interposition between the liver and right hemidiaphragm. Pancreas: Unremarkable. No pancreatic ductal dilatation or surrounding inflammatory changes. Spleen: Normal in size without focal abnormality. Adrenals/Urinary Tract: Normal bilateral adrenal glands. Perinephric fat stranding about the right kidney were there is moderate right-sided hydroureteronephrosis secondary to a 4 mm distal right ureteral stone. No obstructive uropathy of the left renal collecting system. No enhancing renal mass. Repeat delayed imaging through both kidneys demonstrates symmetric appearing pyelograms despite the right right-sided renal calculus and hydronephrosis. Small cystocele of the bladder from pelvic floor relaxation. Stomach/Bowel: Physiologic distention of the stomach. Normal small bowel rotation without obstruction. Status post appendectomy. Moderate fecal residue within the colon. No bowel obstruction or inflammation. Vascular/Lymphatic: 3.1 x 3.3 cm infrarenal fusiform aortic aneurysm, slightly increased from  prior 2016 exam where it had measured up to 2.7 cm. No lymphadenopathy. Reproductive: Status post hysterectomy. No adnexal masses. Other: No abdominal wall hernia or abnormality. No abdominopelvic ascites. Musculoskeletal: Mild physiologic anterior wedging of T12 with superior endplate Schmorl's node. Degenerative disc disease L4-5 unchanged in appearance. No acute nor suspicious osseous appearing abnormality. IMPRESSION: 1. 4 mm distal right ureteral calculus causing moderate right-sided hydroureteronephrosis and perinephric fat stranding. The calculus is approximately 1 cm from the UVJ. 2. Slight increase in size of fusiform infrarenal aortic aneurysm 3.1 x 3.3 cm versus 2.7 cm in 2016. Recommend followup by ultrasound in 3 years. This recommendation follows ACR consensus guidelines: White Paper of the ACR Incidental Findings Committee II on Vascular Findings. J Am Coll Radiol 2013; 16:109-604 3. Morphologic appearance of cirrhosis. 4. Cholelithiasis without secondary signs of acute cholecystitis. 5. Coronary arteriosclerosis. Electronically Signed   By: Ashley Royalty M.D.   On: 09/28/2017 21:35    I independently reviewed the above imaging studies.  Impression/Assessment:  Infected, obstructing right ureteral stone  Plan:  Cystoscopy, right retrograde ureteropyelogram and right double-J stent placement.  I discussed the procedure, as well as urgent need for this, with the patient and her husband.  They understand and desire to proceed.  At  this point, the biggest risk going forward is the risk of anesthesia in this obese 66 year old female.  They understand this.  She will need admission to the hospitalist service following the procedure.

## 2017-09-28 NOTE — ED Notes (Signed)
Patient transported to CT/xray 

## 2017-09-28 NOTE — ED Notes (Signed)
Pt remains in radiology, will run abx on return to room

## 2017-09-28 NOTE — ED Notes (Signed)
Secretary made aware of activation of code sepsis

## 2017-09-28 NOTE — H&P (Signed)
TRH H&P   Patient Demographics:    Courtney Orozco, is a 66 y.o. female  MRN: [id number]   DOB - 1951/04/18  Admit Date - 09/28/2017  Outpatient Primary MD for the patient is Galen Manila, MD  Referring MD/NP/PA:  Virgel Manifold  Outpatient Specialists:   Patient coming from: home  Chief Complaint  Patient presents with  . Headache      HPI:    Courtney Orozco  is a 66 y.o. female, w hypertension, hyperlipidemia, dm2, hypothyroidism, apparently c/o .Marland Kitchen  In ED CT abd/ pelvis IMPRESSION: 1. 4 mm distal right ureteral calculus causing moderate right-sided hydroureteronephrosis and perinephric fat stranding. The calculus is approximately 1 cm from the UVJ. 2. Slight increase in size of fusiform infrarenal aortic aneurysm 3.1 x 3.3 cm versus 2.7 cm in 2016. Recommend followup by ultrasound in 3 years. This recommendation follows ACR consensus guidelines: White Paper of the ACR Incidental Findings Committee II on Vascular Findings. J Am Coll Radiol 2013; 45:809-983 3. Morphologic appearance of cirrhosis. 4. Cholelithiasis without secondary signs of acute cholecystitis. 5. Coronary arteriosclerosis.   Na 137, K 3.5, Bun 14, Creatinine 1.00 Hco3 18 Glucose 141 Ast 20, Alt 18  Urinalysis  Wbc tntc, rbc tntc  ED consulted urology for evaluation of nephrolithaisis Pt was given azactam in ED,  Pt will be admitted for UTI with hematuria and also nephrolithiasis    Review of systems:    In addition to the HPI above,  No Fever-chills, No Headache, No changes with Vision or hearing, No problems swallowing food or Liquids, No Chest pain, Cough or Shortness of Breath, No Abdominal pain, No Nausea or Vommitting, Bowel movements are regular, No Blood in stool or Urine, No dysuria, No new skin rashes or bruises, No new joints pains-aches,  No new weakness, tingling,  numbness in any extremity, No recent weight gain or loss, No polyuria, polydypsia or polyphagia, No significant Mental Stressors.  A full 10 point Review of Systems was done, except as stated above, all other Review of Systems were negative.   With Past History of the following :    Past Medical History:  Diagnosis Date  . Anxiety   . Arthritis   . Back pain   . Depression   . Diabetes mellitus without complication ([place])    no meds in 5 years   . GERD (gastroesophageal reflux disease)   . Headache    hx of migraines   . Hepatitis    hx of hep B - 44 years ago   . Hypertension   . Hypothyroidism   . Pneumonia    hx of 01/2015   . PONV (postoperative nausea and vomiting)   . Thyroid disease       Past Surgical History:  Procedure Laterality Date  . ABDOMINAL HYSTERECTOMY    . APPENDECTOMY    . BACK SURGERY  x 4  . bladder tack surgery     . BREAST SURGERY    . CATARACT EXTRACTION W/PHACO Left 04/30/2017   Procedure: CATARACT EXTRACTION PHACO AND INTRAOCULAR LENS PLACEMENT (IOC);  Surgeon: Tonny Branch, MD;  Location: [facility];  Service: Ophthalmology;  Laterality: Left;  CDE: 7.16  . CATARACT EXTRACTION W/PHACO Right 05/14/2017   Procedure: CATARACT EXTRACTION PHACO AND INTRAOCULAR LENS PLACEMENT RIGHT EYE;  Surgeon: Tonny Branch, MD;  Location: [facility];  Service: Ophthalmology;  Laterality: Right;  CDE: 6.63  . CYSTOSCOPY WITH URETEROSCOPY, STONE BASKETRY AND STENT PLACEMENT Right 04/06/2015   Procedure: CYSTOSCOPY WITH RIGHT URETEROSCOPY, STONE BASKETRY AND STENT PLACEMENT;  Surgeon: Irine Seal, MD;  Location: [facility];  Service: Urology;  Laterality: Right;  . HOLMIUM LASER APPLICATION Right 2/95/6213   Procedure: HOLMIUM LASER APPLICATION;  Surgeon: Irine Seal, MD;  Location: [facility];  Service: Urology;  Laterality: Right;  . KNEE SURGERY     left  . ROTATOR CUFF REPAIR    . thumb surgery     left      Social History:     Social History   Tobacco Use  . Smoking  status: Current Every Day Smoker    Packs/day: 0.50    Types: Cigarettes  . Smokeless tobacco: Never Used  Substance Use Topics  . Alcohol use: No     Lives -  At home  Mobility - walks by self   Family History :    History reviewed. No pertinent family history.   Home Medications:   Prior to Admission medications   Medication Sig Start Date End Date Taking? Authorizing Provider  aspirin 325 MG tablet Take 325 mg by mouth daily.   Yes [provider]  buPROPion (WELLBUTRIN XL) 300 MG 24 hr tablet Take 300 mg by mouth daily.  02/10/15  Yes [provider]  CRANBERRY PO Take 1 tablet by mouth daily.   Yes [provider]  cyanocobalamin ([employer] VITAMIN B12) 1000 MCG tablet Take 1 tablet by mouth daily.   Yes [provider]  cyclobenzaprine (FLEXERIL) 5 MG tablet Take 10 mg by mouth daily.    Yes [provider]  diazepam (VALIUM) 5 MG tablet Take 5 mg by mouth 3 (three) times daily.  03/24/15  Yes [provider]  Fluticasone-Salmeterol (ADVAIR DISKUS) 250-50 MCG/DOSE AEPB Inhale 1 puff into the lungs 2 (two) times daily. 09/17/17  Yes [provider]  HYDROcodone-acetaminophen (NORCO/VICODIN) 5-325 MG per tablet Take 1 tablet by mouth every 6 (six) hours as needed for moderate pain. Patient taking differently: Take 1 tablet by mouth every 8 (eight) hours as needed for moderate pain.  04/06/15  Yes Irine Seal, MD  levothyroxine (SYNTHROID, LEVOTHROID) 100 MCG tablet Take 100 mcg by mouth daily. Take with 149mcg tablet for total dose of 242mcg daily   Yes [provider]  levothyroxine (SYNTHROID, LEVOTHROID) 175 MCG tablet Take 175 mcg by mouth daily. Take one tablet with 117mcg tablet for total dose of 221mcg   Yes [provider]  Multiple Vitamin (MULTIVITAMIN WITH MINERALS) TABS Take 1 tablet by mouth daily.   Yes [provider]  nortriptyline (PAMELOR) 25 MG capsule Take 100 mg by mouth at  bedtime.  05/13/12  Yes [provider]  potassium chloride SA (KLOR-CON M20) 20 MEQ tablet Take 40 mEq by mouth 4 (four) times daily.    Yes [provider]  topiramate (TOPAMAX) 200 MG tablet Take 200 mg by mouth  at bedtime.  10/23/11  Yes [provider]  torsemide (DEMADEX) 20 MG tablet Take 10 mg by mouth daily. 09/17/17  Yes [provider]  verapamil (CALAN-SR) 240 MG CR tablet Take 240 mg by mouth daily.   Yes [provider]     Allergies:     Allergies  Allergen Reactions  . Ciprofloxacin Hives  . Azithromycin Itching and Swelling  . Penicillins Swelling    Has patient had a PCN reaction causing immediate rash, facial/tongue/throat swelling, SOB or lightheadedness with hypotension: Yes Has patient had a PCN reaction causing severe rash involving mucus membranes or skin necrosis: No Has patient had a PCN reaction that required hospitalization No Has patient had a PCN reaction occurring within the last 10 years: No If all of the above answers are "NO", then may proceed with Cephalosporin use.   . Sulfa Antibiotics Itching     Physical Exam:   Vitals  Blood pressure (!) 123/49, pulse 96, temperature 98.2 F (36.8 C), temperature source Oral, resp. rate (!) 24, height 5\' 4"  (1.626 m), weight 95.3 kg (210 lb), SpO2 93 %.   1. General  lying in bed in NAD,    2. Normal affect and insight, Not Suicidal or Homicidal, Awake Alert, Oriented X 3.  3. No F.N deficits, ALL C.Nerves Intact, Strength 5/5 all 4 extremities, Sensation intact all 4 extremities, Plantars down going.  4. Ears and Eyes appear Normal, Conjunctivae clear, PERRLA. Moist Oral Mucosa.  5. Supple Neck, No JVD, No cervical lymphadenopathy appriciated, No Carotid Bruits.  6. Symmetrical Chest wall movement, Good air movement bilaterally, CTAB.  7. RRR, No Gallops, Rubs or Murmurs, No Parasternal Heave.  8. Positive Bowel Sounds, Abdomen Soft, No tenderness, No  organomegaly appriciated,No rebound -guarding or rigidity.  9.  No Cyanosis, Normal Skin Turgor, No Skin Rash or Bruise.  10. Good muscle tone,  joints appear normal , no effusions, Normal ROM.  11. No Palpable Lymph Nodes in Neck or Axillae     Data Review:    CBC Recent Labs  Lab 09/28/17 2000  WBC 17.7*  HGB 14.1  HCT 43.5  PLT 230  MCV 94.6  MCH 30.7  MCHC 32.4  RDW 13.2  LYMPHSABS 1.1  MONOABS 1.3*  EOSABS 0.0  BASOSABS 0.0   ------------------------------------------------------------------------------------------------------------------  Chemistries  Recent Labs  Lab 09/28/17 2000  NA 137  K 3.5  CL 109  CO2 18*  GLUCOSE 141*  BUN 14  CREATININE 1.00  CALCIUM 9.1  AST 20  ALT 18  ALKPHOS 95  BILITOT 0.8   ------------------------------------------------------------------------------------------------------------------ estimated creatinine clearance is 61.1 mL/min (by C-G formula based on SCr of 1 mg/dL). ------------------------------------------------------------------------------------------------------------------ No results for input(s): TSH, T4TOTAL, T3FREE, THYROIDAB in the last 72 hours.  Invalid input(s): FREET3  Coagulation profile Recent Labs  Lab 09/28/17 2000  INR 1.06   ------------------------------------------------------------------------------------------------------------------- No results for input(s): DDIMER in the last 72 hours. -------------------------------------------------------------------------------------------------------------------  Cardiac Enzymes No results for input(s): CKMB, TROPONINI, MYOGLOBIN in the last 168 hours.  Invalid input(s): CK ------------------------------------------------------------------------------------------------------------------ No results found for:  BNP   ---------------------------------------------------------------------------------------------------------------  Urinalysis    Component Value Date/Time   COLORURINE YELLOW 09/28/2017 2030   APPEARANCEUR HAZY (A) 09/28/2017 2030   LABSPEC 1.012 09/28/2017 2030   PHURINE 7.0 09/28/2017 2030   GLUCOSEU NEGATIVE 09/28/2017 2030   [place] (A) 09/28/2017 2030   BILIRUBINUR NEGATIVE 09/28/2017 2030   [place] 09/28/2017 2030   PROTEINUR 30 (A) 09/28/2017 2030  NITRITE NEGATIVE 09/28/2017 2030   LEUKOCYTESUR SMALL (A) 09/28/2017 2030    ----------------------------------------------------------------------------------------------------------------   Imaging Results:    Dg Chest 2 View  Result Date: 09/28/2017 CLINICAL DATA:  Pain and dysuria.  Fever. EXAM: CHEST  2 VIEW COMPARISON:  January 22, 2017 FINDINGS: There is elevation of the right hemidiaphragm with right base atelectasis. There is no edema or consolidation. The heart size and pulmonary vascularity are normal. No adenopathy. No evident bone lesions. There is colonic interposition between the right hemidiaphragm and liver. IMPRESSION: Elevation of right hemidiaphragm with right base atelectasis. No edema or consolidation. Stable cardiac silhouette. Electronically Signed   By: Lowella Grip III M.D.   On: 09/28/2017 21:08   Ct Abdomen Pelvis W Contrast  Result Date: 09/28/2017 CLINICAL DATA:  66 year old female with generalized weakness, fever and lower abdominal pain. Difficulty urinating. EXAM: CT ABDOMEN AND PELVIS WITH CONTRAST TECHNIQUE: Multidetector CT imaging of the abdomen and pelvis was performed using the standard protocol following bolus administration of intravenous contrast. CONTRAST:  197mL ISOVUE-300 IOPAMIDOL (ISOVUE-300) INJECTION 61% COMPARISON:  03/14/2015 FINDINGS: Lower chest: Normal heart size with coronary arteriosclerosis. No pericardial effusion. Chronic elevation/eventration of the  right hemidiaphragm. Hepatobiliary: The liver surface appears slightly nodular consistent morphologically with cirrhosis. No biliary dilatation. There is gallbladder distention with tiny layering calculi. No wall thickening is identified. There is colonic interposition between the liver and right hemidiaphragm. Pancreas: Unremarkable. No pancreatic ductal dilatation or surrounding inflammatory changes. Spleen: Normal in size without focal abnormality. Adrenals/Urinary Tract: Normal bilateral adrenal glands. Perinephric fat stranding about the right kidney were there is moderate right-sided hydroureteronephrosis secondary to a 4 mm distal right ureteral stone. No obstructive uropathy of the left renal collecting system. No enhancing renal mass. Repeat delayed imaging through both kidneys demonstrates symmetric appearing pyelograms despite the right right-sided renal calculus and hydronephrosis. Small cystocele of the bladder from pelvic floor relaxation. Stomach/Bowel: Physiologic distention of the stomach. Normal small bowel rotation without obstruction. Status post appendectomy. Moderate fecal residue within the colon. No bowel obstruction or inflammation. Vascular/Lymphatic: 3.1 x 3.3 cm infrarenal fusiform aortic aneurysm, slightly increased from prior 2016 exam where it had measured up to 2.7 cm. No lymphadenopathy. Reproductive: Status post hysterectomy. No adnexal masses. Other: No abdominal wall hernia or abnormality. No abdominopelvic ascites. Musculoskeletal: Mild physiologic anterior wedging of T12 with superior endplate Schmorl's node. Degenerative disc disease L4-5 unchanged in appearance. No acute nor suspicious osseous appearing abnormality. IMPRESSION: 1. 4 mm distal right ureteral calculus causing moderate right-sided hydroureteronephrosis and perinephric fat stranding. The calculus is approximately 1 cm from the UVJ. 2. Slight increase in size of fusiform infrarenal aortic aneurysm 3.1 x 3.3 cm  versus 2.7 cm in 2016. Recommend followup by ultrasound in 3 years. This recommendation follows ACR consensus guidelines: White Paper of the ACR Incidental Findings Committee II on Vascular Findings. J Am Coll Radiol 2013; 19:622-297 3. Morphologic appearance of cirrhosis. 4. Cholelithiasis without secondary signs of acute cholecystitis. 5. Coronary arteriosclerosis. Electronically Signed   By: Ashley Royalty M.D.   On: 09/28/2017 21:35     Assessment & Plan:    Principal Problem:   UTI (urinary tract infection) Active Problems:   Leukocytosis    Uti Await urine culture Cont Azactam  Leukocytosis Secondary to above  Nephrolithiasis with upj obstruction Urology consulted, appreciate input  AAA Will need outpatient follow up  Dm2 fsbs q4h , ISS  Hypothyroidism Cont levothyroxine  Hypertension Cont torsemide Cont verapamil  Migraine HA Cont  topiramate  Anxiety Cont wellbutrin   DVT Prophylaxis Lovenox - SCDs  AM Labs Ordered, also please review Full Orders  Family Communication: Admission, patients condition and plan of care including tests being ordered have been discussed with the patient  who indicate understanding and agree with the plan and Code Status.  Code Status FULL CODE  Likely DC to  home  Condition GUARDED    Consults called:   urology  Admission status: inpatient  Time spent in minutes : 45   Jani Gravel M.D on 09/28/2017 at 11:10 PM  Between 7am to 7pm - Pager - [phone]   After 7pm go to [email or web address] - password [facility]  [facility] - Office  [phone]

## 2017-09-28 NOTE — ED Provider Notes (Signed)
[facility] EMERGENCY DEPARTMENT Provider Note   CSN: [id number] Arrival date & time: 09/28/17  1957     History   Chief Complaint Chief Complaint  Patient presents with  . Headache    HPI CHRISANN MELARAGNO is a 66 y.o. female.  HPI 66 year old female who presents with generalized weakness and fever.  She has a history of hypothyroidism, hypertension, anxiety and depression.  Reports that she has not been feeling well over the past few days.  Her husband states that she seemed more confused.  She has been complaining of cough with mild sputum, lower abdominal pain, difficulty urinating, and low back pain.  Denies vomiting, diarrhea, dysuria, chest pain, or difficulty breathing.  Has not had any falls.   Past Medical History:  Diagnosis Date  . Anxiety   . Arthritis   . Back pain   . Depression   . Diabetes mellitus without complication ([place])    no meds in 5 years   . GERD (gastroesophageal reflux disease)   . Headache    hx of migraines   . Hepatitis    hx of hep B - 44 years ago   . Hypertension   . Hypothyroidism   . Pneumonia    hx of 01/2015   . PONV (postoperative nausea and vomiting)   . Thyroid disease     Patient Active Problem List   Diagnosis Date Noted  . UTI (urinary tract infection) 09/28/2017  . Syncope 01/22/2017  . Renal calculus, right 04/07/2015  . Renal stone 04/06/2015    Past Surgical History:  Procedure Laterality Date  . ABDOMINAL HYSTERECTOMY    . APPENDECTOMY    . BACK SURGERY     x 4  . bladder tack surgery     . BREAST SURGERY    . CATARACT EXTRACTION W/PHACO Left 04/30/2017   Procedure: CATARACT EXTRACTION PHACO AND INTRAOCULAR LENS PLACEMENT (IOC);  Surgeon: Tonny Branch, MD;  Location: [facility];  Service: Ophthalmology;  Laterality: Left;  CDE: 7.16  . CATARACT EXTRACTION W/PHACO Right 05/14/2017   Procedure: CATARACT EXTRACTION PHACO AND INTRAOCULAR LENS PLACEMENT RIGHT EYE;  Surgeon: Tonny Branch, MD;  Location: [facility];  Service:  Ophthalmology;  Laterality: Right;  CDE: 6.63  . CYSTOSCOPY WITH URETEROSCOPY, STONE BASKETRY AND STENT PLACEMENT Right 04/06/2015   Procedure: CYSTOSCOPY WITH RIGHT URETEROSCOPY, STONE BASKETRY AND STENT PLACEMENT;  Surgeon: Irine Seal, MD;  Location: [facility];  Service: Urology;  Laterality: Right;  . HOLMIUM LASER APPLICATION Right 2/95/2841   Procedure: HOLMIUM LASER APPLICATION;  Surgeon: Irine Seal, MD;  Location: [facility];  Service: Urology;  Laterality: Right;  . KNEE SURGERY     left  . ROTATOR CUFF REPAIR    . thumb surgery     left    OB History    No data available       Home Medications    Prior to Admission medications   Medication Sig Start Date End Date Taking? Authorizing Provider  aspirin 325 MG tablet Take 325 mg by mouth daily.   Yes [provider]  buPROPion (WELLBUTRIN XL) 300 MG 24 hr tablet Take 300 mg by mouth daily.  02/10/15  Yes [provider]  CRANBERRY PO Take 1 tablet by mouth daily.   Yes [provider]  cyanocobalamin ([employer] VITAMIN B12) 1000 MCG tablet Take 1 tablet by mouth daily.   Yes [provider]  cyclobenzaprine (FLEXERIL) 5 MG tablet Take 10 mg by mouth  daily.    Yes [provider]  diazepam (VALIUM) 5 MG tablet Take 5 mg by mouth 3 (three) times daily.  03/24/15  Yes [provider]  Fluticasone-Salmeterol (ADVAIR DISKUS) 250-50 MCG/DOSE AEPB Inhale 1 puff into the lungs 2 (two) times daily. 09/17/17  Yes [provider]  HYDROcodone-acetaminophen (NORCO/VICODIN) 5-325 MG per tablet Take 1 tablet by mouth every 6 (six) hours as needed for moderate pain. Patient taking differently: Take 1 tablet by mouth every 8 (eight) hours as needed for moderate pain.  04/06/15  Yes Irine Seal, MD  levothyroxine (SYNTHROID, LEVOTHROID) 100 MCG tablet Take 100 mcg by mouth daily. Take with 120mcg tablet for total dose of 245mcg daily   Yes [provider]  levothyroxine (SYNTHROID,  LEVOTHROID) 175 MCG tablet Take 175 mcg by mouth daily. Take one tablet with 158mcg tablet for total dose of 232mcg   Yes [provider]  Multiple Vitamin (MULTIVITAMIN WITH MINERALS) TABS Take 1 tablet by mouth daily.   Yes [provider]  nortriptyline (PAMELOR) 25 MG capsule Take 100 mg by mouth at bedtime.  05/13/12  Yes [provider]  potassium chloride SA (KLOR-CON M20) 20 MEQ tablet Take 40 mEq by mouth 4 (four) times daily.    Yes [provider]  topiramate (TOPAMAX) 200 MG tablet Take 200 mg by mouth at bedtime.  10/23/11  Yes [provider]  torsemide (DEMADEX) 20 MG tablet Take 10 mg by mouth daily. 09/17/17  Yes [provider]  verapamil (CALAN-SR) 240 MG CR tablet Take 240 mg by mouth daily.   Yes [provider]    Family History No family history on file.  Social History Social History   Tobacco Use  . Smoking status: Current Every Day Smoker    Packs/day: 0.50    Types: Cigarettes  . Smokeless tobacco: Never Used  Substance Use Topics  . Alcohol use: No  . Drug use: No     Allergies   Ciprofloxacin; Azithromycin; Penicillins; and Sulfa antibiotics   Review of Systems Review of Systems  Constitutional: Positive for fatigue and fever.  Respiratory: Positive for cough. Negative for shortness of breath.   Cardiovascular: Negative for chest pain.  Gastrointestinal: Positive for abdominal pain.  Genitourinary: Positive for difficulty urinating.  Neurological: Positive for headaches.  All other systems reviewed and are negative.    Physical Exam Updated Vital Signs BP (!) 123/49   Pulse 96   Temp 98.2 F (36.8 C) (Oral)   Resp (!) 24   Ht 5\' 4"  (1.626 m)   Wt 95.3 kg (210 lb)   SpO2 93%   BMI 36.05 kg/m   Physical Exam Physical Exam  Nursing note and vitals reviewed. Constitutional: appears listless and fatigued, non-toxic, and in no acute distress Head: Normocephalic and  atraumatic.  Mouth/Throat: Oropharynx is clear and moist.  Neck: Normal range of motion. Neck supple. No nuchal rigidity Cardiovascular: Normal rate and regular rhythm.   Pulmonary/Chest: Effort normal and breath sounds normal.  Abdominal: Soft. There is low abdominal tenderness. There is no rebound and no guarding.  Musculoskeletal: No deformity. No edema.  Neurological: Alert, no facial droop, fluent speech, moves all extremities symmetrically Skin: Skin is warm and dry.  Psychiatric: Cooperative   ED Treatments / Results  Labs (all labs ordered are listed, but only abnormal results are displayed) Labs Reviewed  COMPREHENSIVE METABOLIC PANEL - Abnormal; Notable for the following components:      Result Value  CO2 18 (*)    Glucose, Bld 141 (*)    GFR calc non Af Amer 57 (*)    All other components within normal limits  CBC WITH DIFFERENTIAL/PLATELET - Abnormal; Notable for the following components:   WBC 17.7 (*)    Neutro Abs 15.2 (*)    Monocytes Absolute 1.3 (*)    All other components within normal limits  URINALYSIS, ROUTINE W REFLEX MICROSCOPIC - Abnormal; Notable for the following components:   APPearance HAZY (*)    Hgb urine dipstick LARGE (*)    Protein, ur 30 (*)    Leukocytes, UA SMALL (*)    Bacteria, UA RARE (*)    Squamous Epithelial / LPF 0-5 (*)    All other components within normal limits  I-STAT CG4 LACTIC ACID, ED - Abnormal; Notable for the following components:   Lactic Acid, Venous 2.23 (*)    All other components within normal limits  I-STAT CG4 LACTIC ACID, ED - Abnormal; Notable for the following components:   Lactic Acid, Venous 1.98 (*)    All other components within normal limits  CULTURE, BLOOD (ROUTINE X 2)  CULTURE, BLOOD (ROUTINE X 2)  PROTIME-INR  INFLUENZA PANEL BY PCR (TYPE A & B)    EKG  EKG Interpretation  Date/Time:  Friday September 28 2017 20:05:13 EST Ventricular Rate:  109 PR Interval:    QRS Duration: 98 QT  Interval:  330 QTC Calculation: 445 R Axis:   76 Text Interpretation:  Sinus tachycardia Low voltage, precordial leads No acute changes Confirmed by Brantley Stage [phone]) on 09/28/2017 10:02:51 PM       Radiology Dg Chest 2 View  Result Date: 09/28/2017 CLINICAL DATA:  Pain and dysuria.  Fever. EXAM: CHEST  2 VIEW COMPARISON:  January 22, 2017 FINDINGS: There is elevation of the right hemidiaphragm with right base atelectasis. There is no edema or consolidation. The heart size and pulmonary vascularity are normal. No adenopathy. No evident bone lesions. There is colonic interposition between the right hemidiaphragm and liver. IMPRESSION: Elevation of right hemidiaphragm with right base atelectasis. No edema or consolidation. Stable cardiac silhouette. Electronically Signed   By: Lowella Grip III M.D.   On: 09/28/2017 21:08   Ct Abdomen Pelvis W Contrast  Result Date: 09/28/2017 CLINICAL DATA:  66 year old female with generalized weakness, fever and lower abdominal pain. Difficulty urinating. EXAM: CT ABDOMEN AND PELVIS WITH CONTRAST TECHNIQUE: Multidetector CT imaging of the abdomen and pelvis was performed using the standard protocol following bolus administration of intravenous contrast. CONTRAST:  112mL ISOVUE-300 IOPAMIDOL (ISOVUE-300) INJECTION 61% COMPARISON:  03/14/2015 FINDINGS: Lower chest: Normal heart size with coronary arteriosclerosis. No pericardial effusion. Chronic elevation/eventration of the right hemidiaphragm. Hepatobiliary: The liver surface appears slightly nodular consistent morphologically with cirrhosis. No biliary dilatation. There is gallbladder distention with tiny layering calculi. No wall thickening is identified. There is colonic interposition between the liver and right hemidiaphragm. Pancreas: Unremarkable. No pancreatic ductal dilatation or surrounding inflammatory changes. Spleen: Normal in size without focal abnormality. Adrenals/Urinary Tract: Normal bilateral adrenal  glands. Perinephric fat stranding about the right kidney were there is moderate right-sided hydroureteronephrosis secondary to a 4 mm distal right ureteral stone. No obstructive uropathy of the left renal collecting system. No enhancing renal mass. Repeat delayed imaging through both kidneys demonstrates symmetric appearing pyelograms despite the right right-sided renal calculus and hydronephrosis. Small cystocele of the bladder from pelvic floor relaxation. Stomach/Bowel: Physiologic distention of the stomach. Normal small bowel rotation without obstruction.  Status post appendectomy. Moderate fecal residue within the colon. No bowel obstruction or inflammation. Vascular/Lymphatic: 3.1 x 3.3 cm infrarenal fusiform aortic aneurysm, slightly increased from prior 2016 exam where it had measured up to 2.7 cm. No lymphadenopathy. Reproductive: Status post hysterectomy. No adnexal masses. Other: No abdominal wall hernia or abnormality. No abdominopelvic ascites. Musculoskeletal: Mild physiologic anterior wedging of T12 with superior endplate Schmorl's node. Degenerative disc disease L4-5 unchanged in appearance. No acute nor suspicious osseous appearing abnormality. IMPRESSION: 1. 4 mm distal right ureteral calculus causing moderate right-sided hydroureteronephrosis and perinephric fat stranding. The calculus is approximately 1 cm from the UVJ. 2. Slight increase in size of fusiform infrarenal aortic aneurysm 3.1 x 3.3 cm versus 2.7 cm in 2016. Recommend followup by ultrasound in 3 years. This recommendation follows ACR consensus guidelines: White Paper of the ACR Incidental Findings Committee II on Vascular Findings. J Am Coll Radiol 2013; 68:088-110 3. Morphologic appearance of cirrhosis. 4. Cholelithiasis without secondary signs of acute cholecystitis. 5. Coronary arteriosclerosis. Electronically Signed   By: Ashley Royalty M.D.   On: 09/28/2017 21:35    Procedures Procedures (including critical care  time)  Medications Ordered in ED Medications  vancomycin (VANCOCIN) IVPB 1000 mg/200 mL premix (1,000 mg Intravenous New Bag/Given 09/28/17 2147)  levofloxacin (LEVAQUIN) IVPB 750 mg (750 mg Intravenous New Bag/Given 09/28/17 2121)  sodium chloride 0.9 % bolus 1,000 mL (0 mLs Intravenous Stopped 09/28/17 2049)  acetaminophen (TYLENOL) tablet 1,000 mg (1,000 mg Oral Given 09/28/17 2040)  sodium chloride 0.9 % bolus 1,000 mL (0 mLs Intravenous Stopped 09/28/17 2146)  aztreonam (AZACTAM) 2 g in dextrose 5 % 50 mL IVPB (0 g Intravenous Stopped 09/28/17 2147)  iopamidol (ISOVUE-300) 61 % injection 100 mL (100 mLs Intravenous Contrast Given 09/28/17 2103)     Initial Impression / Assessment and Plan / ED Course  I have reviewed the triage vital signs and the nursing notes.  Pertinent labs & imaging results that were available during my care of the patient were reviewed by me and considered in my medical decision making (see chart for details).     66 year old female who presents with sepsis.  Febrile, tachycardic, tachypneic on presentation.  Some mild confusion, but no focal neurological deficits.  Source of sepsis is due to infected kidney stone.  She has evidence of a UTI and on CT abdomen pelvis has a right ureteral stone with hydronephrosis.  Chest x-ray visualized and without pneumonia.  Blood work notable for mildly elevated lactic acid of 2.24 and leukocytosis of 17.7.  No significant endorgan damage.  She did receive 2 L of IV fluids as well as broad-spectrum antibiotics.  Urology is consulted.  Dr. Diona Fanti will plan on performing ureteral stent placement here at any [facility].  He is requesting hospitalist admission.  Dr. Maudie Mercury to admit patient to hospitalist service.  Final Clinical Impressions(s) / ED Diagnoses   Final diagnoses:  Sepsis, due to unspecified organism [facility])  Acute pyelonephritis  Right ureteral stone    ED Discharge Orders    None       Forde Dandy, MD 09/28/17  2236

## 2017-09-28 NOTE — ED Notes (Signed)
Report called to heather, rn. Anethesia and hospitalist at bedside.

## 2017-09-28 NOTE — Anesthesia Procedure Notes (Signed)
Procedure Name: MAC Date/Time: 09/28/2017 11:35 PM Performed by: Andree Elk Shonn Farruggia A, CRNA Pre-anesthesia Checklist: Patient identified, Timeout performed, Emergency Drugs available, Suction available and Patient being monitored Oxygen Delivery Method: Simple face mask

## 2017-09-28 NOTE — Anesthesia Preprocedure Evaluation (Signed)
Anesthesia Evaluation  Patient identified by MRN, date of birth, ID band Patient awake    History of Anesthesia Complications (+) PONV  Airway Mallampati: II  TM Distance: >3 FB Neck ROM: Full    Dental  (+) Edentulous Upper, Edentulous Lower   Pulmonary Current Smoker,    breath sounds clear to auscultation       Cardiovascular hypertension,  Rhythm:Regular Rate:Normal     Neuro/Psych Anxiety    GI/Hepatic GERD  Controlled,(+) Hepatitis -  Endo/Other  diabetes, Type 2  Renal/GU Renal disease     Musculoskeletal   Abdominal   Peds  Hematology   Anesthesia Other Findings   Reproductive/Obstetrics                             Anesthesia Physical Anesthesia Plan  ASA: III and emergent  Anesthesia Plan: MAC   Post-op Pain Management:    Induction:   PONV Risk Score and Plan: Ondansetron  Airway Management Planned: Simple Face Mask  Additional Equipment:   Intra-op Plan:   Post-operative Plan:   Informed Consent:   Plan Discussed with: CRNA  Anesthesia Plan Comments:         Anesthesia Quick Evaluation

## 2017-09-28 NOTE — ED Notes (Signed)
ED Provider at bedside and phlebotomy to collect blood cultures.

## 2017-09-28 NOTE — ED Triage Notes (Signed)
Per medic report...Marland KitchenMarland KitchenPt arrives from home. C/o headache x2 hours, slurred speech earlier today that has resolved, left lower abdominal pain, dysuria temp 103.6, cbg 123, sbp 155, ST 116. IV established in the right hand (119ml ns en route given).

## 2017-09-29 ENCOUNTER — Inpatient Hospital Stay (HOSPITAL_COMMUNITY): Payer: PPO

## 2017-09-29 ENCOUNTER — Other Ambulatory Visit: Payer: Self-pay

## 2017-09-29 ENCOUNTER — Encounter (HOSPITAL_COMMUNITY): Payer: Self-pay | Admitting: Family Medicine

## 2017-09-29 DIAGNOSIS — N201 Calculus of ureter: Secondary | ICD-10-CM | POA: Diagnosis not present

## 2017-09-29 LAB — CBC
HEMATOCRIT: 40 % (ref 36.0–46.0)
Hemoglobin: 12.7 g/dL (ref 12.0–15.0)
MCH: 30.5 pg (ref 26.0–34.0)
MCHC: 31.8 g/dL (ref 30.0–36.0)
MCV: 95.9 fL (ref 78.0–100.0)
Platelets: 224 10*3/uL (ref 150–400)
RBC: 4.17 MIL/uL (ref 3.87–5.11)
RDW: 13.4 % (ref 11.5–15.5)
WBC: 29.2 10*3/uL — AB (ref 4.0–10.5)

## 2017-09-29 LAB — GLUCOSE, CAPILLARY
GLUCOSE-CAPILLARY: 100 mg/dL — AB (ref 65–99)
GLUCOSE-CAPILLARY: 127 mg/dL — AB (ref 65–99)
GLUCOSE-CAPILLARY: 128 mg/dL — AB (ref 65–99)
Glucose-Capillary: 126 mg/dL — ABNORMAL HIGH (ref 65–99)

## 2017-09-29 LAB — COMPREHENSIVE METABOLIC PANEL
ALT: 15 U/L (ref 14–54)
ANION GAP: 7 (ref 5–15)
AST: 16 U/L (ref 15–41)
Albumin: 2.9 g/dL — ABNORMAL LOW (ref 3.5–5.0)
Alkaline Phosphatase: 79 U/L (ref 38–126)
BILIRUBIN TOTAL: 0.7 mg/dL (ref 0.3–1.2)
BUN: 16 mg/dL (ref 6–20)
CO2: 18 mmol/L — ABNORMAL LOW (ref 22–32)
Calcium: 8.2 mg/dL — ABNORMAL LOW (ref 8.9–10.3)
Chloride: 112 mmol/L — ABNORMAL HIGH (ref 101–111)
Creatinine, Ser: 1.01 mg/dL — ABNORMAL HIGH (ref 0.44–1.00)
GFR, EST NON AFRICAN AMERICAN: 56 mL/min — AB (ref >60)
Glucose, Bld: 119 mg/dL — ABNORMAL HIGH (ref 65–99)
POTASSIUM: 3.5 mmol/L (ref 3.5–5.1)
Sodium: 137 mmol/L (ref 135–145)
TOTAL PROTEIN: 5.8 g/dL — AB (ref 6.5–8.1)

## 2017-09-29 MED ORDER — SODIUM CHLORIDE 0.9 % IV SOLN
INTRAVENOUS | Status: DC
  Administered 2017-09-29 (×2): via INTRAVENOUS

## 2017-09-29 MED ORDER — VERAPAMIL HCL ER 240 MG PO TBCR
240.0000 mg | EXTENDED_RELEASE_TABLET | Freq: Every day | ORAL | Status: DC
  Administered 2017-09-29: 240 mg via ORAL
  Filled 2017-09-29: qty 1

## 2017-09-29 MED ORDER — LEVOFLOXACIN 750 MG PO TABS: 750.0000 mg | ORAL_TABLET | Freq: Every day | ORAL | 0 refills | Status: DC

## 2017-09-29 MED ORDER — TORSEMIDE 20 MG PO TABS
10.0000 mg | ORAL_TABLET | Freq: Every day | ORAL | Status: DC
  Administered 2017-09-29: 10 mg via ORAL
  Filled 2017-09-29: qty 1

## 2017-09-29 MED ORDER — DEXTROSE 5 % IV SOLN
2.0000 g | Freq: Three times a day (TID) | INTRAVENOUS | Status: DC
  Administered 2017-09-29: 2 g via INTRAVENOUS
  Filled 2017-09-29 (×8): qty 2

## 2017-09-29 MED ORDER — IPRATROPIUM-ALBUTEROL 0.5-2.5 (3) MG/3ML IN SOLN
RESPIRATORY_TRACT | Status: AC
  Filled 2017-09-29: qty 3

## 2017-09-29 MED ORDER — ORAL CARE MOUTH RINSE
15.0000 mL | Freq: Two times a day (BID) | OROMUCOSAL | Status: DC
  Administered 2017-09-29: 15 mL via OROMUCOSAL

## 2017-09-29 MED ORDER — POTASSIUM CHLORIDE CRYS ER 20 MEQ PO TBCR
40.0000 meq | EXTENDED_RELEASE_TABLET | Freq: Four times a day (QID) | ORAL | Status: DC
  Administered 2017-09-29: 40 meq via ORAL
  Filled 2017-09-29: qty 2

## 2017-09-29 MED ORDER — MOMETASONE FURO-FORMOTEROL FUM 200-5 MCG/ACT IN AERO
2.0000 | INHALATION_SPRAY | Freq: Two times a day (BID) | RESPIRATORY_TRACT | Status: DC
  Administered 2017-09-29: 2 via RESPIRATORY_TRACT
  Filled 2017-09-29: qty 8.8

## 2017-09-29 MED ORDER — NITROFURANTOIN MONOHYD MACRO 100 MG PO CAPS: 100.0000 mg | ORAL_CAPSULE | Freq: Two times a day (BID) | ORAL | 0 refills | Status: DC

## 2017-09-29 MED ORDER — ENOXAPARIN SODIUM 40 MG/0.4ML ~~LOC~~ SOLN: 40.0000 mg | Freq: Every day | SUBCUTANEOUS | Status: DC

## 2017-09-29 MED ORDER — BUPROPION HCL ER (XL) 300 MG PO TB24
300.0000 mg | ORAL_TABLET | Freq: Every day | ORAL | Status: DC
  Administered 2017-09-29: 300 mg via ORAL
  Filled 2017-09-29: qty 1

## 2017-09-29 MED ORDER — VITAMIN B-12 1000 MCG PO TABS
1000.0000 ug | ORAL_TABLET | Freq: Every day | ORAL | Status: DC
  Administered 2017-09-29: 1000 ug via ORAL
  Filled 2017-09-29: qty 1

## 2017-09-29 MED ORDER — HYDROCODONE-ACETAMINOPHEN 5-325 MG PO TABS
1.0000 | ORAL_TABLET | Freq: Three times a day (TID) | ORAL | Status: DC | PRN
  Administered 2017-09-29: 1 via ORAL
  Filled 2017-09-29: qty 1

## 2017-09-29 MED ORDER — STERILE WATER FOR IRRIGATION IR SOLN
Status: DC | PRN
  Administered 2017-09-29: 1000 mL

## 2017-09-29 MED ORDER — IPRATROPIUM-ALBUTEROL 0.5-2.5 (3) MG/3ML IN SOLN
3.0000 mL | Freq: Once | RESPIRATORY_TRACT | Status: AC
Start: 2017-09-29 — End: 2017-09-29
  Administered 2017-09-29: 3 mL via RESPIRATORY_TRACT

## 2017-09-29 MED ORDER — LEVOTHYROXINE SODIUM 100 MCG PO TABS
100.0000 ug | ORAL_TABLET | Freq: Every day | ORAL | Status: DC
  Administered 2017-09-29: 100 ug via ORAL
  Filled 2017-09-29: qty 1

## 2017-09-29 MED ORDER — ACETAMINOPHEN 650 MG RE SUPP: 650.0000 mg | Freq: Four times a day (QID) | RECTAL | Status: DC | PRN

## 2017-09-29 MED ORDER — NORTRIPTYLINE HCL 25 MG PO CAPS: 100.0000 mg | ORAL_CAPSULE | Freq: Every day | ORAL | Status: DC

## 2017-09-29 MED ORDER — TOPIRAMATE 100 MG PO TABS
200.0000 mg | ORAL_TABLET | Freq: Every day | ORAL | Status: DC
  Administered 2017-09-29: 200 mg via ORAL
  Filled 2017-09-29: qty 2

## 2017-09-29 MED ORDER — DIATRIZOATE MEGLUMINE 30 % UR SOLN
URETHRAL | Status: DC | PRN
  Administered 2017-09-29: 5 mL via URETHRAL

## 2017-09-29 MED ORDER — ACETAMINOPHEN 325 MG PO TABS: 650.0000 mg | ORAL_TABLET | Freq: Four times a day (QID) | ORAL | Status: DC | PRN

## 2017-09-29 MED ORDER — CYCLOBENZAPRINE HCL 10 MG PO TABS
10.0000 mg | ORAL_TABLET | Freq: Every day | ORAL | Status: DC
  Administered 2017-09-29: 10 mg via ORAL
  Filled 2017-09-29: qty 1

## 2017-09-29 MED ORDER — LEVOTHYROXINE SODIUM 75 MCG PO TABS
175.0000 ug | ORAL_TABLET | Freq: Every day | ORAL | Status: DC
  Administered 2017-09-29: 09:00:00 175 ug via ORAL
  Filled 2017-09-29: qty 1

## 2017-09-29 MED ORDER — ASPIRIN 325 MG PO TABS
325.0000 mg | ORAL_TABLET | Freq: Every day | ORAL | Status: DC
  Administered 2017-09-29: 325 mg via ORAL
  Filled 2017-09-29: qty 1

## 2017-09-29 MED ORDER — DIAZEPAM 5 MG PO TABS
5.0000 mg | ORAL_TABLET | Freq: Three times a day (TID) | ORAL | Status: DC
  Administered 2017-09-29: 5 mg via ORAL
  Filled 2017-09-29: qty 1

## 2017-09-29 NOTE — Op Note (Signed)
Preoperative diagnosis: Right distal ureteral stone with pyelonephrosis  Postoperative diagnosis: Same  Principal procedure: Cystoscopy, right retrograde ureteral pyelogram with fluoroscopic interpretation, placement of 6 French by 24 cm contour double-J stent without tether  Surgeon: Alyne Martinson  Anesthesia: Sedation, provided by anesthesia  Drains: Above mentioned stent, 16 French Foley catheter to bedside bag  Complications: None  Indications: 66 year old female with to 3-week history of general malaise, right flank pain and recent fever.  She was found to have an obstructing, infected right distal ureteral stone this evening.  Urologic consultation was requested.  Because of the patient's probable sepsis secondary to an infected, obstructed right ureteral stone, I recommended to the patient and her husband urgent stent placement.  She presents at this time for that procedure.  Findings: Large capacity bladder, infected smelling urine.  Normal urothelium.  Normal ureteral orifice ease.  Retrograde study revealed dilated right ureter and renal pelvis.  Description of procedure: The patient was properly identified and marked preoperatively.  She was taken to the operating room where anesthesia provided excellent sedation.  Her genitalia and perineum were prepped and draped following placement of the patient in the dorsolithotomy position.  Proper timeout was performed.  A 22 French panendoscope was advanced into her bladder which was drained of a large amount of urine.  Circumferential inspection was performed with no abnormalities seen.  The right ureteral orifice was cannulated with a guidewire.  This was advanced to where I thought was the renal pelvis.  The stent was advanced over this, but 2 curls were seen in the stent, one in the mid ureter and one in the presumed area of the renal pelvis.  A Teflon wire had been used to place this.  I then removed the stent over top of the guidewire.  A  sensor tip guidewire was then utilized for the later procedure.  I then first confirmed the normal ureteral and renal pelvic anatomy with contrast injection.  I then passed a sensor tip guidewire up into the upper pole calyceal system.  Over top of this, a 6 [place], 24 cm contour double-J stent was placed.  The string had been removed.  Adequate proximal and distal curls were seen.  At this point, the bladder was drained.  The procedure was terminated.  A 16 French Foley catheter was placed and hooked to a leg bag.  She was then taken to the PACU, having tolerated the procedure well.

## 2017-09-29 NOTE — Transfer of Care (Signed)
Immediate Anesthesia Transfer of Care Note Late Entry for 1225 Patient: Courtney Orozco  Procedure(s) Performed: CYSTOSCOPY WITH STENT PLACEMENT (Right )  Patient Location: PACU  Anesthesia Type:MAC  Level of Consciousness: awake, alert , oriented and patient cooperative  Airway & Oxygen Therapy: Patient Spontanous Breathing and Patient connected to face mask oxygen  Post-op Assessment: Report given to RN and Post -op Vital signs reviewed and stable  Post vital signs: Reviewed and stable  Last Vitals:  Vitals:   09/28/17 2306 09/29/17 0018  BP: 133/82 (!) 120/50  Pulse:  (!) 111  Resp: (!) 21 (!) 29  Temp:  37.9 C  SpO2:  (P) 100%    Last Pain:  Vitals:   09/28/17 2124  TempSrc: Oral  PainSc:          Complications: No apparent anesthesia complications

## 2017-09-29 NOTE — Progress Notes (Signed)
Dahlstedt, MD was paged regarding pt. MD gave a verbal order to removed indwelling catheter. MD reported to RN that he will be signing off on this patient unless complications arise. Attending MD, Wynetta Emery paged with this information.   Celestia Khat, RN

## 2017-09-29 NOTE — Anesthesia Postprocedure Evaluation (Signed)
Anesthesia Post Note  Patient: Courtney Orozco  Procedure(s) Performed: CYSTOSCOPY WITH STENT PLACEMENT (Right )  Patient location during evaluation: PACU Anesthesia Type: MAC Level of consciousness: awake and alert, oriented and patient cooperative Pain management: pain level controlled Vital Signs Assessment: post-procedure vital signs reviewed and stable Respiratory status: spontaneous breathing and respiratory function stable (Patient wheezing; duoneb given) Cardiovascular status: stable Postop Assessment: no apparent nausea or vomiting Anesthetic complications: no     Last Vitals:  Vitals:   09/28/17 2230 09/28/17 2306  BP: (!) 112/47 133/82  Pulse:    Resp: (!) 22 (!) 21  Temp:    SpO2:      Last Pain:  Vitals:   09/28/17 2124  TempSrc: Oral  PainSc:                  ADAMS, AMY A

## 2017-09-29 NOTE — Progress Notes (Signed)
ANTIBIOTIC CONSULT NOTE - INITIAL  Pharmacy Consult for aztreonam Indication: UTI  Allergies  Allergen Reactions  . Ciprofloxacin Hives  . Azithromycin Itching and Swelling  . Penicillins Swelling    Has patient had a PCN reaction causing immediate rash, facial/tongue/throat swelling, SOB or lightheadedness with hypotension: Yes Has patient had a PCN reaction causing severe rash involving mucus membranes or skin necrosis: No Has patient had a PCN reaction that required hospitalization No Has patient had a PCN reaction occurring within the last 10 years: No If all of the above answers are "NO", then may proceed with Cephalosporin use.   . Sulfa Antibiotics Itching    Patient Measurements: Height: 5\' 4"  (162.6 cm) Weight: 215 lb 9.8 oz (97.8 kg) IBW/kg (Calculated) : 54.7   Vital Signs: Temp: 98.5 F (36.9 C) (12/08 0345) Temp Source: Oral (12/08 0345) BP: 101/58 (12/08 0345) Pulse Rate: 100 (12/08 0345) Intake/Output from previous day: 12/07 0701 - 12/08 0700 In: 2671.7 [I.V.:1621.7; IV Piggyback:1050] Out: 182 [Urine:450; Blood:2] Intake/Output from this shift: No intake/output data recorded.  Labs: Recent Labs    09/28/17 2000 09/29/17 0651  WBC 17.7* 29.2*  HGB 14.1 12.7  PLT 230 224  CREATININE 1.00 1.01*   Estimated Creatinine Clearance: 61.4 mL/min (A) (by C-G formula based on SCr of 1.01 mg/dL (H)). No results for input(s): VANCOTROUGH, VANCOPEAK, VANCORANDOM, GENTTROUGH, GENTPEAK, GENTRANDOM, TOBRATROUGH, TOBRAPEAK, TOBRARND, AMIKACINPEAK, AMIKACINTROU, AMIKACIN in the last 72 hours.   Microbiology: Recent Results (from the past 720 hour(s))  Culture, blood (Routine x 2)     Status: None (Preliminary result)   Collection Time: 09/28/17  8:22 PM  Result Value Ref Range Status   Specimen Description BLOOD RIGHT HAND  Final   Special Requests   Final    BOTTLES DRAWN AEROBIC ONLY Blood Culture results may not be optimal due to an excessive volume of blood  received in culture bottles   Culture NO GROWTH < 12 HOURS  Final   Report Status PENDING  Incomplete  Culture, blood (Routine x 2)     Status: None (Preliminary result)   Collection Time: 09/28/17  8:28 PM  Result Value Ref Range Status   Specimen Description BLOOD RIGHT ARM  Final   Special Requests   Final    BOTTLES DRAWN AEROBIC ONLY Blood Culture results may not be optimal due to an excessive volume of blood received in culture bottles   Culture NO GROWTH < 12 HOURS  Final   Report Status PENDING  Incomplete    Medical History: Past Medical History:  Diagnosis Date  . Anxiety   . Arthritis   . Back pain   . Depression   . Diabetes mellitus without complication ([place])    no meds in 5 years   . GERD (gastroesophageal reflux disease)   . Headache    hx of migraines   . Hepatitis    hx of hep B - 44 years ago   . Hypertension   . Hypothyroidism   . Pneumonia    hx of 01/2015   . PONV (postoperative nausea and vomiting)   . Thyroid disease     Medications:  Medications Prior to Admission  Medication Sig Dispense Refill Last Dose  . aspirin 325 MG tablet Take 325 mg by mouth daily.   09/28/2017 at Unknown time  . buPROPion (WELLBUTRIN XL) 300 MG 24 hr tablet Take 300 mg by mouth daily.    09/28/2017 at Unknown time  . CRANBERRY PO Take  1 tablet by mouth daily.   09/28/2017 at Unknown time  . cyanocobalamin ([employer] VITAMIN B12) 1000 MCG tablet Take 1 tablet by mouth daily.   09/28/2017 at Unknown time  . cyclobenzaprine (FLEXERIL) 5 MG tablet Take 10 mg by mouth daily.    09/28/2017 at Unknown time  . diazepam (VALIUM) 5 MG tablet Take 5 mg by mouth 3 (three) times daily.    09/28/2017 at Unknown time  . Fluticasone-Salmeterol (ADVAIR DISKUS) 250-50 MCG/DOSE AEPB Inhale 1 puff into the lungs 2 (two) times daily.   unknown  . HYDROcodone-acetaminophen (NORCO/VICODIN) 5-325 MG per tablet Take 1 tablet by mouth every 6 (six) hours as needed for moderate pain. (Patient taking  differently: Take 1 tablet by mouth every 8 (eight) hours as needed for moderate pain. ) 30 tablet 0 unknown  . levothyroxine (SYNTHROID, LEVOTHROID) 100 MCG tablet Take 100 mcg by mouth daily. Take with 139mcg tablet for total dose of 264mcg daily   09/28/2017 at Unknown time  . levothyroxine (SYNTHROID, LEVOTHROID) 175 MCG tablet Take 175 mcg by mouth daily. Take one tablet with 147mcg tablet for total dose of 240mcg   09/28/2017 at Unknown time  . Multiple Vitamin (MULTIVITAMIN WITH MINERALS) TABS Take 1 tablet by mouth daily.   09/28/2017 at Unknown time  . nortriptyline (PAMELOR) 25 MG capsule Take 100 mg by mouth at bedtime.    09/28/2017 at Unknown time  . potassium chloride SA (KLOR-CON M20) 20 MEQ tablet Take 40 mEq by mouth 4 (four) times daily.    09/28/2017 at Unknown time  . topiramate (TOPAMAX) 200 MG tablet Take 200 mg by mouth at bedtime.    unknown  . torsemide (DEMADEX) 20 MG tablet Take 10 mg by mouth daily.   09/28/2017 at Unknown time  . verapamil (CALAN-SR) 240 MG CR tablet Take 240 mg by mouth daily.   09/28/2017 at Unknown time   Assessment: 66 yo lady to continue aztreonam for UTI.  She received 2gm dose last night  Goal of Therapy:  Eradication of infection  Plan:  Cont aztreonam 2gm IV q8 hours F/u cultures and clinical course  Ronan Duecker [place] 09/29/2017,8:21 AM
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
## 2017-09-29 NOTE — Discharge Instructions (Signed)
Seek medical care or return to ER if you develop fever, chills, nausea, vomiting or severe pain or discomfort or if you are having trouble urinating.      Follow with Primary MD  Galen Manila, MD  and other consultant's as instructed your Hospitalist MD  Please get a complete blood count and chemistry panel checked by your Primary MD at your next visit, and again as instructed by your Primary MD.  Get Medicines reviewed and adjusted: Please take all your medications with you for your next visit with your Primary MD  Laboratory/radiological data: Please request your Primary MD to go over all hospital tests and procedure/radiological results at the follow up, please ask your Primary MD to get all Hospital records sent to his/her office.  In some cases, they will be blood work, cultures and biopsy results pending at the time of your discharge. Please request that your primary care M.D. follows up on these results.  Also Note the following: If you experience worsening of your admission symptoms, develop shortness of breath, life threatening emergency, suicidal or homicidal thoughts you must seek medical attention immediately by calling 911 or calling your MD immediately  if symptoms less severe.  You must read complete instructions/literature along with all the possible adverse reactions/side effects for all the Medicines you take and that have been prescribed to you. Take any new Medicines after you have completely understood and accpet all the possible adverse reactions/side effects.   Do not drive when taking Pain medications or sleeping medications (Benzodaizepines)  Do not take more than prescribed Pain, Sleep and Anxiety Medications. It is not advisable to combine anxiety,sleep and pain medications without talking with your primary care practitioner  Special Instructions: If you have smoked or chewed Tobacco  in the last 2 yrs please stop smoking, stop any regular Alcohol  and or any  Recreational drug use.  Wear Seat belts while driving.  Please note: You were cared for by a hospitalist during your hospital stay. Once you are discharged, your primary care physician will handle any further medical issues. Please note that NO REFILLS for any discharge medications will be authorized once you are discharged, as it is imperative that you return to your primary care physician (or establish a relationship with a primary care physician if you do not have one) for your post hospital discharge needs so that they can reassess your need for medications and monitor your lab values.

## 2017-09-29 NOTE — Progress Notes (Signed)
Patient is to be discharged home and in stable condition. Patient's IV and telemetry removed, WNL. Patient given discharge instructions and verbalized understanding. Patient will be escorted out by staff via wheelchair.  Shantelle Alles P Dishmon, RN  

## 2017-09-29 NOTE — Anesthesia Postprocedure Evaluation (Signed)
Anesthesia Post Note  Patient: Courtney Orozco  Procedure(s) Performed: CYSTOSCOPY WITH STENT PLACEMENT (Right )  Patient location during evaluation: PACU Anesthesia Type: MAC Level of consciousness: awake Pain management: pain level controlled Vital Signs Assessment: post-procedure vital signs reviewed and stable Respiratory status: spontaneous breathing Cardiovascular status: stable Postop Assessment: no apparent nausea or vomiting Anesthetic complications: no     Last Vitals:  Vitals:   09/28/17 2306 09/29/17 0018  BP: 133/82 (!) 120/50  Pulse:  (!) 111  Resp: (!) 21 (!) 29  Temp:  37.9 C  SpO2:  100%    Last Pain:  Vitals:   09/28/17 2124  TempSrc: Oral  PainSc:                  ADAMS, AMY A

## 2017-09-30 ENCOUNTER — Encounter (HOSPITAL_COMMUNITY): Payer: Self-pay | Admitting: *Deleted

## 2017-09-30 ENCOUNTER — Other Ambulatory Visit: Payer: Self-pay

## 2017-09-30 ENCOUNTER — Emergency Department (HOSPITAL_COMMUNITY): Payer: PPO

## 2017-09-30 ENCOUNTER — Inpatient Hospital Stay (HOSPITAL_COMMUNITY)
Admission: EM | Admit: 2017-09-30 | Discharge: 2017-10-03 | DRG: 871 | Disposition: A | Discharge status: Home / Self Care | Payer: PPO | Attending: Internal Medicine | Admitting: Internal Medicine

## 2017-09-30 DIAGNOSIS — R7881 Bacteremia: Secondary | ICD-10-CM | POA: Diagnosis present

## 2017-09-30 DIAGNOSIS — Z882 Allergy status to sulfonamides status: Secondary | ICD-10-CM | POA: Diagnosis not present

## 2017-09-30 DIAGNOSIS — F4489 Other dissociative and conversion disorders: Secondary | ICD-10-CM | POA: Diagnosis not present

## 2017-09-30 DIAGNOSIS — F17210 Nicotine dependence, cigarettes, uncomplicated: Secondary | ICD-10-CM | POA: Diagnosis not present

## 2017-09-30 DIAGNOSIS — R0902 Hypoxemia: Secondary | ICD-10-CM | POA: Diagnosis present

## 2017-09-30 DIAGNOSIS — E039 Hypothyroidism, unspecified: Secondary | ICD-10-CM | POA: Diagnosis not present

## 2017-09-30 DIAGNOSIS — E119 Type 2 diabetes mellitus without complications: Secondary | ICD-10-CM | POA: Diagnosis present

## 2017-09-30 DIAGNOSIS — I10 Essential (primary) hypertension: Secondary | ICD-10-CM | POA: Diagnosis not present

## 2017-09-30 DIAGNOSIS — M1990 Unspecified osteoarthritis, unspecified site: Secondary | ICD-10-CM | POA: Diagnosis present

## 2017-09-30 DIAGNOSIS — Z7982 Long term (current) use of aspirin: Secondary | ICD-10-CM | POA: Diagnosis not present

## 2017-09-30 DIAGNOSIS — G9341 Metabolic encephalopathy: Secondary | ICD-10-CM | POA: Diagnosis present

## 2017-09-30 DIAGNOSIS — K219 Gastro-esophageal reflux disease without esophagitis: Secondary | ICD-10-CM | POA: Diagnosis not present

## 2017-09-30 DIAGNOSIS — Z90710 Acquired absence of both cervix and uterus: Secondary | ICD-10-CM

## 2017-09-30 DIAGNOSIS — N200 Calculus of kidney: Secondary | ICD-10-CM | POA: Diagnosis not present

## 2017-09-30 DIAGNOSIS — F329 Major depressive disorder, single episode, unspecified: Secondary | ICD-10-CM | POA: Diagnosis present

## 2017-09-30 DIAGNOSIS — N3001 Acute cystitis with hematuria: Secondary | ICD-10-CM | POA: Diagnosis not present

## 2017-09-30 DIAGNOSIS — Z961 Presence of intraocular lens: Secondary | ICD-10-CM | POA: Diagnosis not present

## 2017-09-30 DIAGNOSIS — R9431 Abnormal electrocardiogram [ECG] [EKG]: Secondary | ICD-10-CM | POA: Diagnosis not present

## 2017-09-30 DIAGNOSIS — N136 Pyonephrosis: Secondary | ICD-10-CM | POA: Diagnosis present

## 2017-09-30 DIAGNOSIS — Z79890 Hormone replacement therapy (postmenopausal): Secondary | ICD-10-CM

## 2017-09-30 DIAGNOSIS — F419 Anxiety disorder, unspecified: Secondary | ICD-10-CM | POA: Diagnosis not present

## 2017-09-30 DIAGNOSIS — B9620 Unspecified Escherichia coli [E. coli] as the cause of diseases classified elsewhere: Secondary | ICD-10-CM

## 2017-09-30 DIAGNOSIS — A4151 Sepsis due to Escherichia coli [E. coli]: Principal | ICD-10-CM | POA: Diagnosis present

## 2017-09-30 DIAGNOSIS — R0602 Shortness of breath: Secondary | ICD-10-CM | POA: Diagnosis not present

## 2017-09-30 DIAGNOSIS — Z9842 Cataract extraction status, left eye: Secondary | ICD-10-CM

## 2017-09-30 DIAGNOSIS — N390 Urinary tract infection, site not specified: Secondary | ICD-10-CM | POA: Diagnosis present

## 2017-09-30 DIAGNOSIS — Z9841 Cataract extraction status, right eye: Secondary | ICD-10-CM | POA: Diagnosis not present

## 2017-09-30 DIAGNOSIS — Z825 Family history of asthma and other chronic lower respiratory diseases: Secondary | ICD-10-CM

## 2017-09-30 DIAGNOSIS — R4182 Altered mental status, unspecified: Secondary | ICD-10-CM | POA: Diagnosis not present

## 2017-09-30 DIAGNOSIS — Z880 Allergy status to penicillin: Secondary | ICD-10-CM | POA: Diagnosis not present

## 2017-09-30 DIAGNOSIS — J449 Chronic obstructive pulmonary disease, unspecified: Secondary | ICD-10-CM | POA: Diagnosis not present

## 2017-09-30 DIAGNOSIS — R531 Weakness: Secondary | ICD-10-CM | POA: Diagnosis not present

## 2017-09-30 DIAGNOSIS — Z881 Allergy status to other antibiotic agents status: Secondary | ICD-10-CM | POA: Diagnosis not present

## 2017-09-30 DIAGNOSIS — I6789 Other cerebrovascular disease: Secondary | ICD-10-CM | POA: Diagnosis not present

## 2017-09-30 DIAGNOSIS — R402441 Other coma, without documented Glasgow coma scale score, or with partial score reported, in the field [EMT or ambulance]: Secondary | ICD-10-CM | POA: Diagnosis not present

## 2017-09-30 LAB — BLOOD CULTURE ID PANEL (REFLEXED)

## 2017-09-30 LAB — CBC
HCT: 37.3 % (ref 36.0–46.0)
Hemoglobin: 11.8 g/dL — ABNORMAL LOW (ref 12.0–15.0)
MCH: 30.7 pg (ref 26.0–34.0)
MCHC: 31.6 g/dL (ref 30.0–36.0)
MCV: 97.1 fL (ref 78.0–100.0)
Platelets: 179 10*3/uL (ref 150–400)
RBC: 3.84 MIL/uL — ABNORMAL LOW (ref 3.87–5.11)
RDW: 13.8 % (ref 11.5–15.5)
WBC: 6.2 10*3/uL (ref 4.0–10.5)

## 2017-09-30 LAB — COMPREHENSIVE METABOLIC PANEL
ALT: 17 U/L (ref 14–54)
AST: 16 U/L (ref 15–41)
Albumin: 3 g/dL — ABNORMAL LOW (ref 3.5–5.0)
Alkaline Phosphatase: 80 U/L (ref 38–126)
Anion gap: 6 (ref 5–15)
BUN: 19 mg/dL (ref 6–20)
CO2: 20 mmol/L — ABNORMAL LOW (ref 22–32)
Calcium: 9.2 mg/dL (ref 8.9–10.3)
Chloride: 113 mmol/L — ABNORMAL HIGH (ref 101–111)
Creatinine, Ser: 1.14 mg/dL — ABNORMAL HIGH (ref 0.44–1.00)
GFR calc Af Amer: 56 mL/min — ABNORMAL LOW (ref >60)
GFR calc non Af Amer: 49 mL/min — ABNORMAL LOW (ref >60)
Glucose, Bld: 103 mg/dL — ABNORMAL HIGH (ref 65–99)
Potassium: 4 mmol/L (ref 3.5–5.1)
Sodium: 139 mmol/L (ref 135–145)
Total Bilirubin: 0.5 mg/dL (ref 0.3–1.2)
Total Protein: 6.4 g/dL — ABNORMAL LOW (ref 6.5–8.1)

## 2017-09-30 LAB — CBG MONITORING, ED: Glucose-Capillary: 104 mg/dL — ABNORMAL HIGH (ref 65–99)

## 2017-09-30 MED ORDER — DEXTROSE 5 % IV SOLN
1.0000 g | Freq: Three times a day (TID) | INTRAVENOUS | Status: DC
  Administered 2017-09-30 – 2017-10-02 (×5): 1 g via INTRAVENOUS
  Filled 2017-09-30 (×10): qty 1

## 2017-09-30 MED ORDER — SODIUM CHLORIDE 0.9 % IV BOLUS (SEPSIS)
1000.0000 mL | Freq: Once | INTRAVENOUS | Status: AC
  Administered 2017-09-30: 1000 mL via INTRAVENOUS

## 2017-09-30 MED ORDER — AZTREONAM 1 G IJ SOLR: 1.0000 g | Freq: Three times a day (TID) | INTRAMUSCULAR | Status: DC

## 2017-09-30 NOTE — ED Provider Notes (Signed)
[facility] EMERGENCY DEPARTMENT Provider Note   CSN: [id number] Arrival date & time: 09/30/17  2213     History   Chief Complaint Chief Complaint  Patient presents with  . Altered Mental Status    HPI Courtney Orozco is a 66 y.o. female.  HPI   66yF with confusion. Admitted 12/7 with uti/sepsis in setting of obstructing ureteral stone and stent placed. She left AMA yesterday. I don't see urine culture data but blood culture resulted today with e coli. She received aztreonam in hospital and sent home on macrodantin because of multiple allergies.   She tells me she feels very tired and just wants to sleep. She told me her husband called because he felt she was confused. She denies acute pain. Denies fever. Reports compliance with her meds.   Past Medical History:  Diagnosis Date  . Anxiety   . Arthritis   . Back pain   . Depression   . Diabetes mellitus without complication ([place])    no meds in 5 years   . GERD (gastroesophageal reflux disease)   . Headache    hx of migraines   . Hepatitis    hx of hep B - 44 years ago   . Hypertension   . Hypothyroidism   . Pneumonia    hx of 01/2015   . PONV (postoperative nausea and vomiting)   . Thyroid disease     Patient Active Problem List   Diagnosis Date Noted  . UTI (urinary tract infection) 09/28/2017  . Leukocytosis 09/28/2017  . Right ureteral stone   . Syncope 01/22/2017  . Renal calculus, right 04/07/2015  . Renal stone 04/06/2015    Past Surgical History:  Procedure Laterality Date  . ABDOMINAL HYSTERECTOMY    . APPENDECTOMY    . BACK SURGERY     x 4  . bladder tack surgery     . BREAST SURGERY    . CATARACT EXTRACTION W/PHACO Left 04/30/2017   Procedure: CATARACT EXTRACTION PHACO AND INTRAOCULAR LENS PLACEMENT (IOC);  Surgeon: Tonny Branch, MD;  Location: [facility];  Service: Ophthalmology;  Laterality: Left;  CDE: 7.16  . CATARACT EXTRACTION W/PHACO Right 05/14/2017   Procedure: CATARACT EXTRACTION PHACO AND  INTRAOCULAR LENS PLACEMENT RIGHT EYE;  Surgeon: Tonny Branch, MD;  Location: [facility];  Service: Ophthalmology;  Laterality: Right;  CDE: 6.63  . CYSTOSCOPY WITH URETEROSCOPY, STONE BASKETRY AND STENT PLACEMENT Right 04/06/2015   Procedure: CYSTOSCOPY WITH RIGHT URETEROSCOPY, STONE BASKETRY AND STENT PLACEMENT;  Surgeon: Irine Seal, MD;  Location: [facility];  Service: Urology;  Laterality: Right;  . HOLMIUM LASER APPLICATION Right 5/64/3329   Procedure: HOLMIUM LASER APPLICATION;  Surgeon: Irine Seal, MD;  Location: [facility];  Service: Urology;  Laterality: Right;  . KNEE SURGERY     left  . ROTATOR CUFF REPAIR    . thumb surgery     left    OB History    No data available       Home Medications    Prior to Admission medications   Medication Sig Start Date End Date Taking? Authorizing Provider  aspirin 325 MG tablet Take 325 mg by mouth daily.    [provider]  buPROPion (WELLBUTRIN XL) 300 MG 24 hr tablet Take 300 mg by mouth daily.  02/10/15   [provider]  CRANBERRY PO Take 1 tablet by mouth daily.    [provider]  cyanocobalamin ([employer] VITAMIN B12) 1000 MCG tablet Take 1 tablet by  mouth daily.    [provider]  cyclobenzaprine (FLEXERIL) 5 MG tablet Take 10 mg by mouth daily.     [provider]  diazepam (VALIUM) 5 MG tablet Take 5 mg by mouth 3 (three) times daily.  03/24/15   [provider]  Fluticasone-Salmeterol (ADVAIR DISKUS) 250-50 MCG/DOSE AEPB Inhale 1 puff into the lungs 2 (two) times daily. 09/17/17   [provider]  HYDROcodone-acetaminophen (NORCO/VICODIN) 5-325 MG per tablet Take 1 tablet by mouth every 6 (six) hours as needed for moderate pain. Patient taking differently: Take 1 tablet by mouth every 8 (eight) hours as needed for moderate pain.  04/06/15   Irine Seal, MD  levofloxacin (LEVAQUIN) 750 MG tablet Take 1 tablet (750 mg total) by mouth daily. 09/29/17   Johnson, Clanford L, MD  levothyroxine  (SYNTHROID, LEVOTHROID) 100 MCG tablet Take 100 mcg by mouth daily. Take with 147mcg tablet for total dose of 248mcg daily    [provider]  levothyroxine (SYNTHROID, LEVOTHROID) 175 MCG tablet Take 175 mcg by mouth daily. Take one tablet with 181mcg tablet for total dose of 259mcg    [provider]  Multiple Vitamin (MULTIVITAMIN WITH MINERALS) TABS Take 1 tablet by mouth daily.    [provider]  nortriptyline (PAMELOR) 25 MG capsule Take 100 mg by mouth at bedtime.  05/13/12   [provider]  potassium chloride SA (KLOR-CON M20) 20 MEQ tablet Take 40 mEq by mouth 4 (four) times daily.     [provider]  topiramate (TOPAMAX) 200 MG tablet Take 200 mg by mouth at bedtime.  10/23/11   [provider]  torsemide (DEMADEX) 20 MG tablet Take 10 mg by mouth daily. 09/17/17   [provider]  verapamil (CALAN-SR) 240 MG CR tablet Take 240 mg by mouth daily.    [provider]    Family History History reviewed. No pertinent family history.  Social History Social History   Tobacco Use  . Smoking status: Current Every Day Smoker    Packs/day: 0.50    Types: Cigarettes  . Smokeless tobacco: Never Used  Substance Use Topics  . Alcohol use: No  . Drug use: No     Allergies   Ciprofloxacin; Azithromycin; Penicillins; and Sulfa antibiotics   Review of Systems Review of Systems  All systems reviewed and negative, other than as noted in HPI.   Physical Exam Updated Vital Signs BP 91/67 (BP Location: Right Arm)   Pulse 82   Temp 98.8 F (37.1 C) (Oral)   Resp (!) 22   Ht 5\' 4"  (1.626 m)   Wt 97.5 kg (215 lb)   SpO2 95%   BMI 36.90 kg/m   Physical Exam  Constitutional: She appears well-developed.  Laying in bed. Appears tired but not toxic.   HENT:  Head: Normocephalic and atraumatic.  Eyes: Conjunctivae are normal. Right eye exhibits no discharge. Left eye exhibits no discharge.  Neck: Neck supple.    Cardiovascular: Normal rate, regular rhythm and normal heart sounds. Exam reveals no gallop and no friction rub.  No murmur heard. Pulmonary/Chest: Effort normal and breath sounds normal. No respiratory distress.  Abdominal: Soft. She exhibits no distension. There is tenderness.  Mild suprapubic tenderness.   Musculoskeletal: She exhibits no edema or tenderness.  Neurological:  Drowsy but opens eyes to voice. Able to tell me her name and that currently in hospital and that recently admitted with ureteral stent placed. No focal deficits.   Skin:  Skin is warm and dry.  Psychiatric: She has a normal mood and affect. Her behavior is normal. Thought content normal.  Nursing note and vitals reviewed.    ED Treatments / Results  Labs (all labs ordered are listed, but only abnormal results are displayed) Labs Reviewed  CBC - Abnormal; Notable for the following components:      Result Value   RBC 3.84 (*)    Hemoglobin 11.8 (*)    All other components within normal limits  CBG MONITORING, ED - Abnormal; Notable for the following components:   Glucose-Capillary 104 (*)    All other components within normal limits  CULTURE, BLOOD (ROUTINE X 2)  CULTURE, BLOOD (ROUTINE X 2)  COMPREHENSIVE METABOLIC PANEL  LACTIC ACID, PLASMA  LACTIC ACID, PLASMA  URINALYSIS, ROUTINE W REFLEX MICROSCOPIC    EKG  EKG Interpretation  Date/Time:  Sunday September 30 2017 22:21:39 EST Ventricular Rate:  82 PR Interval:    QRS Duration: 118 QT Interval:  382 QTC Calculation: 447 R Axis:   51 Text Interpretation:  Sinus rhythm Prolonged PR interval Nonspecific intraventricular conduction delay Low voltage, precordial leads When compared with ECG of 09/29/2017, HEART RATE has increased Nonspecific ST and T wave abnormality has improved Confirmed by Glick, David ([id number]) on 09/30/2017 11:38:54 PM       Radiology Dg Chest 2 View  Result Date: 09/30/2017 CLINICAL DATA:  Altered mental status.  Recent  hospital discharge. EXAM: CHEST  2 VIEW COMPARISON:  09/28/2017 FINDINGS: Stable right hemidiaphragm elevation. No airspace consolidation. No pleural effusions. Normal pulmonary vasculature. Hilar, mediastinal and cardiac contours are unremarkable and unchanged. IMPRESSION: No active cardiopulmonary disease. Electronically Signed   By: Daniel R Mitchell M.D.   On: 09/30/2017 22:42   Dg C-arm 1-60 Min-no Report  Result Date: 09/29/2017 Fluoroscopy was utilized by the requesting physician.  No radiographic interpretation.    Procedures Procedures (including critical care time)  Medications Ordered in ED Medications  sodium chloride 0.9 % bolus 1,000 mL (not administered)  aztreonam (AZACTAM) 1 g in dextrose 5 % 50 mL IVPB (not administered)     Initial Impression / Assessment and Plan / ED Course  I have reviewed the triage vital signs and the nursing notes.  Pertinent labs & imaging results that were available during my care of the patient were reviewed by me and considered in my medical decision making (see chart for details).     66 yF with change in mental status. On my exam she is very tired but answering most questions appropriately and follows basic commands. Blood culture resulted very early this am. .Will repeat cultures. Aztreonam should be adequate coverage.  BP soft but seems in range as when she was hospitalized, including shortly before discharge. MAP still   Final Clinical Impressions(s) / ED Diagnoses   Final diagnoses:  Bacteremia due to Escherichia coli    ED Discharge Orders    None       Virgel Manifold, MD 10/01/17 0000

## 2017-09-30 NOTE — ED Triage Notes (Signed)
Pt brought in by [facility] for c/o altered menta status; pt was just discharged yesterday from hospital for infected kidney stone; pt's cbg 141; pt lethargic and 848ml fluid bolus given

## 2017-10-01 ENCOUNTER — Other Ambulatory Visit: Payer: Self-pay

## 2017-10-01 ENCOUNTER — Encounter (HOSPITAL_COMMUNITY): Payer: Self-pay | Admitting: Internal Medicine

## 2017-10-01 ENCOUNTER — Other Ambulatory Visit: Payer: Self-pay | Admitting: Internal Medicine

## 2017-10-01 ENCOUNTER — Inpatient Hospital Stay (HOSPITAL_COMMUNITY): Payer: PPO

## 2017-10-01 DIAGNOSIS — Z7982 Long term (current) use of aspirin: Secondary | ICD-10-CM | POA: Diagnosis not present

## 2017-10-01 DIAGNOSIS — Z961 Presence of intraocular lens: Secondary | ICD-10-CM | POA: Diagnosis present

## 2017-10-01 DIAGNOSIS — G9341 Metabolic encephalopathy: Secondary | ICD-10-CM

## 2017-10-01 DIAGNOSIS — Z9842 Cataract extraction status, left eye: Secondary | ICD-10-CM | POA: Diagnosis not present

## 2017-10-01 DIAGNOSIS — Z825 Family history of asthma and other chronic lower respiratory diseases: Secondary | ICD-10-CM | POA: Diagnosis not present

## 2017-10-01 DIAGNOSIS — A4151 Sepsis due to Escherichia coli [E. coli]: Secondary | ICD-10-CM | POA: Diagnosis present

## 2017-10-01 DIAGNOSIS — Z79890 Hormone replacement therapy (postmenopausal): Secondary | ICD-10-CM | POA: Diagnosis not present

## 2017-10-01 DIAGNOSIS — E039 Hypothyroidism, unspecified: Secondary | ICD-10-CM | POA: Diagnosis present

## 2017-10-01 DIAGNOSIS — F17210 Nicotine dependence, cigarettes, uncomplicated: Secondary | ICD-10-CM | POA: Diagnosis present

## 2017-10-01 DIAGNOSIS — R7881 Bacteremia: Secondary | ICD-10-CM | POA: Diagnosis present

## 2017-10-01 DIAGNOSIS — J449 Chronic obstructive pulmonary disease, unspecified: Secondary | ICD-10-CM | POA: Diagnosis present

## 2017-10-01 DIAGNOSIS — Z90710 Acquired absence of both cervix and uterus: Secondary | ICD-10-CM | POA: Diagnosis not present

## 2017-10-01 DIAGNOSIS — Z9841 Cataract extraction status, right eye: Secondary | ICD-10-CM | POA: Diagnosis not present

## 2017-10-01 DIAGNOSIS — R0902 Hypoxemia: Secondary | ICD-10-CM | POA: Diagnosis present

## 2017-10-01 DIAGNOSIS — Z881 Allergy status to other antibiotic agents status: Secondary | ICD-10-CM | POA: Diagnosis not present

## 2017-10-01 DIAGNOSIS — N3001 Acute cystitis with hematuria: Secondary | ICD-10-CM | POA: Diagnosis not present

## 2017-10-01 DIAGNOSIS — Z880 Allergy status to penicillin: Secondary | ICD-10-CM | POA: Diagnosis not present

## 2017-10-01 DIAGNOSIS — F329 Major depressive disorder, single episode, unspecified: Secondary | ICD-10-CM | POA: Diagnosis present

## 2017-10-01 DIAGNOSIS — F419 Anxiety disorder, unspecified: Secondary | ICD-10-CM | POA: Diagnosis present

## 2017-10-01 DIAGNOSIS — N200 Calculus of kidney: Secondary | ICD-10-CM | POA: Diagnosis not present

## 2017-10-01 DIAGNOSIS — R0602 Shortness of breath: Secondary | ICD-10-CM | POA: Diagnosis not present

## 2017-10-01 DIAGNOSIS — E119 Type 2 diabetes mellitus without complications: Secondary | ICD-10-CM | POA: Diagnosis present

## 2017-10-01 DIAGNOSIS — Z882 Allergy status to sulfonamides status: Secondary | ICD-10-CM | POA: Diagnosis not present

## 2017-10-01 DIAGNOSIS — I10 Essential (primary) hypertension: Secondary | ICD-10-CM | POA: Diagnosis present

## 2017-10-01 DIAGNOSIS — K219 Gastro-esophageal reflux disease without esophagitis: Secondary | ICD-10-CM | POA: Diagnosis present

## 2017-10-01 DIAGNOSIS — N136 Pyonephrosis: Secondary | ICD-10-CM | POA: Diagnosis present

## 2017-10-01 DIAGNOSIS — M1990 Unspecified osteoarthritis, unspecified site: Secondary | ICD-10-CM | POA: Diagnosis present

## 2017-10-01 LAB — URINALYSIS, ROUTINE W REFLEX MICROSCOPIC
Bacteria, UA: NONE SEEN
Bilirubin Urine: NEGATIVE
Glucose, UA: NEGATIVE mg/dL
Ketones, ur: NEGATIVE mg/dL
Nitrite: POSITIVE — AB
Protein, ur: 30 mg/dL — AB
Specific Gravity, Urine: 1.014 (ref 1.005–1.030)
pH: 5 (ref 5.0–8.0)

## 2017-10-01 LAB — CBC
HCT: 37.5 % (ref 36.0–46.0)
HEMOGLOBIN: 11.7 g/dL — AB (ref 12.0–15.0)
MCH: 29.8 pg (ref 26.0–34.0)
MCHC: 31.2 g/dL (ref 30.0–36.0)
MCV: 95.7 fL (ref 78.0–100.0)
Platelets: 167 10*3/uL (ref 150–400)
RBC: 3.92 MIL/uL (ref 3.87–5.11)
RDW: 13.6 % (ref 11.5–15.5)
WBC: 4.9 10*3/uL (ref 4.0–10.5)

## 2017-10-01 LAB — BASIC METABOLIC PANEL
ANION GAP: 7 (ref 5–15)
BUN: 16 mg/dL (ref 6–20)
CALCIUM: 8.6 mg/dL — AB (ref 8.9–10.3)
CO2: 18 mmol/L — AB (ref 22–32)
CREATININE: 1.1 mg/dL — AB (ref 0.44–1.00)
Chloride: 112 mmol/L — ABNORMAL HIGH (ref 101–111)
GFR calc Af Amer: 59 mL/min — ABNORMAL LOW (ref >60)
GFR calc non Af Amer: 51 mL/min — ABNORMAL LOW (ref >60)
GLUCOSE: 111 mg/dL — AB (ref 65–99)
Potassium: 3.6 mmol/L (ref 3.5–5.1)
Sodium: 137 mmol/L (ref 135–145)

## 2017-10-01 LAB — BRAIN NATRIURETIC PEPTIDE: B Natriuretic Peptide: 60 pg/mL (ref 0.0–100.0)

## 2017-10-01 LAB — LACTIC ACID, PLASMA
Lactic Acid, Venous: 0.6 mmol/L (ref 0.5–1.9)
Lactic Acid, Venous: 0.9 mmol/L (ref 0.5–1.9)

## 2017-10-01 MED ORDER — ACETAMINOPHEN 325 MG PO TABS: 650.0000 mg | ORAL_TABLET | Freq: Four times a day (QID) | ORAL | Status: DC | PRN

## 2017-10-01 MED ORDER — HYDROCODONE-ACETAMINOPHEN 5-325 MG PO TABS
1.0000 | ORAL_TABLET | ORAL | Status: DC | PRN
  Administered 2017-10-01 – 2017-10-02 (×3): 1 via ORAL
  Filled 2017-10-01 (×3): qty 1

## 2017-10-01 MED ORDER — BISACODYL 5 MG PO TBEC: 5.0000 mg | DELAYED_RELEASE_TABLET | Freq: Every day | ORAL | Status: DC | PRN

## 2017-10-01 MED ORDER — SODIUM CHLORIDE 0.9% FLUSH: 3.0000 mL | INTRAVENOUS | Status: DC | PRN

## 2017-10-01 MED ORDER — ONDANSETRON HCL 4 MG PO TABS: 4.0000 mg | ORAL_TABLET | Freq: Four times a day (QID) | ORAL | Status: DC | PRN

## 2017-10-01 MED ORDER — POTASSIUM CHLORIDE CRYS ER 20 MEQ PO TBCR
40.0000 meq | EXTENDED_RELEASE_TABLET | Freq: Four times a day (QID) | ORAL | Status: DC
  Administered 2017-10-01 – 2017-10-03 (×10): 40 meq via ORAL
  Filled 2017-10-01 (×10): qty 2

## 2017-10-01 MED ORDER — MORPHINE SULFATE (PF) 2 MG/ML IV SOLN: 2.0000 mg | INTRAVENOUS | Status: DC | PRN

## 2017-10-01 MED ORDER — BUPROPION HCL ER (XL) 300 MG PO TB24
300.0000 mg | ORAL_TABLET | Freq: Every day | ORAL | Status: DC
  Administered 2017-10-01 – 2017-10-03 (×3): 300 mg via ORAL
  Filled 2017-10-01 (×3): qty 1

## 2017-10-01 MED ORDER — LEVOTHYROXINE SODIUM 100 MCG PO TABS
100.0000 ug | ORAL_TABLET | Freq: Every day | ORAL | Status: DC
  Administered 2017-10-01 – 2017-10-03 (×3): 100 ug via ORAL
  Filled 2017-10-01 (×3): qty 1

## 2017-10-01 MED ORDER — TORSEMIDE 20 MG PO TABS
10.0000 mg | ORAL_TABLET | Freq: Every day | ORAL | Status: DC
  Administered 2017-10-01 – 2017-10-03 (×3): 10 mg via ORAL
  Filled 2017-10-01 (×3): qty 1

## 2017-10-01 MED ORDER — TOPIRAMATE 100 MG PO TABS
200.0000 mg | ORAL_TABLET | Freq: Every day | ORAL | Status: DC
  Administered 2017-10-01 – 2017-10-02 (×2): 200 mg via ORAL
  Filled 2017-10-01 (×2): qty 2

## 2017-10-01 MED ORDER — LEVOTHYROXINE SODIUM 75 MCG PO TABS
175.0000 ug | ORAL_TABLET | Freq: Every day | ORAL | Status: DC
  Administered 2017-10-01 – 2017-10-03 (×3): 175 ug via ORAL
  Filled 2017-10-01 (×3): qty 1

## 2017-10-01 MED ORDER — ONDANSETRON HCL 4 MG/2ML IJ SOLN: 4.0000 mg | Freq: Four times a day (QID) | INTRAMUSCULAR | Status: DC | PRN

## 2017-10-01 MED ORDER — ORAL CARE MOUTH RINSE
15.0000 mL | Freq: Two times a day (BID) | OROMUCOSAL | Status: DC
  Administered 2017-10-01 – 2017-10-03 (×5): 15 mL via OROMUCOSAL

## 2017-10-01 MED ORDER — ENOXAPARIN SODIUM 40 MG/0.4ML ~~LOC~~ SOLN: 40.0000 mg | SUBCUTANEOUS | Status: DC

## 2017-10-01 MED ORDER — CYCLOBENZAPRINE HCL 10 MG PO TABS
10.0000 mg | ORAL_TABLET | Freq: Every day | ORAL | Status: DC | PRN
Start: 2017-10-01 — End: 2017-10-03

## 2017-10-01 MED ORDER — VITAMIN B-12 1000 MCG PO TABS
1000.0000 ug | ORAL_TABLET | Freq: Every day | ORAL | Status: DC
  Administered 2017-10-01 – 2017-10-03 (×3): 1000 ug via ORAL
  Filled 2017-10-01 (×3): qty 1

## 2017-10-01 MED ORDER — ACETAMINOPHEN 650 MG RE SUPP: 650.0000 mg | Freq: Four times a day (QID) | RECTAL | Status: DC | PRN

## 2017-10-01 MED ORDER — SENNOSIDES-DOCUSATE SODIUM 8.6-50 MG PO TABS: 1.0000 | ORAL_TABLET | Freq: Every evening | ORAL | Status: DC | PRN

## 2017-10-01 MED ORDER — FLUTICASONE FUROATE-VILANTEROL 200-25 MCG/INH IN AEPB
1.0000 | INHALATION_SPRAY | Freq: Every day | RESPIRATORY_TRACT | Status: DC
  Filled 2017-10-01: qty 28

## 2017-10-01 MED ORDER — NORTRIPTYLINE HCL 25 MG PO CAPS
100.0000 mg | ORAL_CAPSULE | Freq: Every day | ORAL | Status: DC
  Administered 2017-10-01 – 2017-10-02 (×2): 100 mg via ORAL
  Filled 2017-10-01 (×2): qty 4

## 2017-10-01 MED ORDER — ADULT MULTIVITAMIN W/MINERALS CH
1.0000 | ORAL_TABLET | Freq: Every day | ORAL | Status: DC
  Administered 2017-10-01 – 2017-10-03 (×3): 1 via ORAL
  Filled 2017-10-01 (×3): qty 1

## 2017-10-01 MED ORDER — SODIUM CHLORIDE 0.9% FLUSH
3.0000 mL | Freq: Two times a day (BID) | INTRAVENOUS | Status: DC
  Administered 2017-10-01 – 2017-10-03 (×6): 3 mL via INTRAVENOUS

## 2017-10-01 MED ORDER — DIAZEPAM 2 MG PO TABS: 2.0000 mg | ORAL_TABLET | Freq: Two times a day (BID) | ORAL | Status: DC | PRN

## 2017-10-01 MED ORDER — ASPIRIN 325 MG PO TABS
325.0000 mg | ORAL_TABLET | Freq: Every day | ORAL | Status: DC
  Administered 2017-10-01 – 2017-10-03 (×3): 325 mg via ORAL
  Filled 2017-10-01 (×3): qty 1

## 2017-10-01 MED ORDER — SODIUM CHLORIDE 0.9 % IV SOLN: 250.0000 mL | INTRAVENOUS | Status: DC | PRN

## 2017-10-01 MED ORDER — VERAPAMIL HCL ER 240 MG PO TBCR
240.0000 mg | EXTENDED_RELEASE_TABLET | Freq: Every day | ORAL | Status: DC
  Filled 2017-10-01: qty 1

## 2017-10-01 NOTE — Assessment & Plan Note (Signed)
On admission has shallow breathing. Likely multifactorial. Is a smoker. Plan: O2 by . Avoid sedating medications. Incentive spirometry. Consider trial of nebs.

## 2017-10-01 NOTE — H&P (Signed)
History and Physical    Patient:  Courtney Orozco [id number] [birth year]   Date of Admission:  09/30/2017  PRIMARY CARE PROVIDER (PCP): Galen Manila, MD    Patient coming from: home   Chief Complaint: continued flank pain and altered mental status  HPI:  The patient is a 66 yo right handed woman recently admitted to the hospital (09/28/17 - 09/29/17) with UTI found to have bacteremia due to E coli, which grew in blood culture, who left against medical advice just yesterday, and now presents back today with continued symptoms of pain and new symptom of altered mental status. Husband came home from work and noticed the patient was slurring words at 2 PM today. Last known normal 8:30 PM last night (09/29/17). She was sleepy and she was talking but her husband could not understand her. Uses walker at baseline, but seemed to have to walk slower. At baseline falls to left. Evaluated in past and was told she likely had a TIA. Did not know date today.  No headache. Slurred speech now resolved but patient still intermittently somnolent. No new focal weakness or numbness. She does have a prescription for valium tid. Other associated symptoms: coughing, shortness of breath.   Regarding the flank pain: Onset: 2 weeks ago started having flank pain, worse starting 3-4 days ago. Duration: intermittent. Timing:  Severity: Pain up to 10/10 at times. Sharp.  Context: Just discharged for UTI with nephrosis. Location: Right and left flank. Radiation: none Character/Quality: Up to 10/10 at times. Sharp.  Alleviated by: Nothing. Exacerbated by: Nothing. Associated Symptoms: Legs swelling since Saturday, worsening to the point she could not get her shoes on.   Abdomen increased in size.  Fever and chills prior to last admission 09/28/17 but not today.  Treatments: none at home except usual medications. Taking PO Levaquin at home. Husband said she does not have a diagnosis of asthma or COPD, but she has  wheezing and shortness of breath sometimes so he lets her use his inhaler, Advair.   ED Course:   Frequently somnolent in ED. Recultured and restarted IV antibiotic, Aztreonam, from previous admission.   Past Medical History:  Diagnosis Date  . Anxiety   . Arthritis   . Back pain   . Depression   . Diabetes mellitus without complication ([place])    no meds in 5 years   . GERD (gastroesophageal reflux disease)   . Headache    hx of migraines   . Hepatitis    hx of hep B - 44 years ago   . Hypertension   . Hypothyroidism   . Pneumonia    hx of 01/2015   . PONV (postoperative nausea and vomiting)   . Thyroid disease     Past Surgical History:  Procedure Laterality Date  . ABDOMINAL HYSTERECTOMY    . APPENDECTOMY    . BACK SURGERY     x 4  . bladder tack surgery     . BREAST SURGERY    . CATARACT EXTRACTION W/PHACO Left 04/30/2017   Procedure: CATARACT EXTRACTION PHACO AND INTRAOCULAR LENS PLACEMENT (IOC);  Surgeon: Tonny Branch, MD;  Location: [facility];  Service: Ophthalmology;  Laterality: Left;  CDE: 7.16  . CATARACT EXTRACTION W/PHACO Right 05/14/2017   Procedure: CATARACT EXTRACTION PHACO AND INTRAOCULAR LENS PLACEMENT RIGHT EYE;  Surgeon: Tonny Branch, MD;  Location: [facility];  Service: Ophthalmology;  Laterality: Right;  CDE: 6.63  . CYSTOSCOPY WITH URETEROSCOPY, STONE BASKETRY AND STENT PLACEMENT Right  04/06/2015   Procedure: CYSTOSCOPY WITH RIGHT URETEROSCOPY, STONE BASKETRY AND STENT PLACEMENT;  Surgeon: Irine Seal, MD;  Location: [facility];  Service: Urology;  Laterality: Right;  . HOLMIUM LASER APPLICATION Right 0/07/9322   Procedure: HOLMIUM LASER APPLICATION;  Surgeon: Irine Seal, MD;  Location: [facility];  Service: Urology;  Laterality: Right;  . KNEE SURGERY     left  . ROTATOR CUFF REPAIR    . thumb surgery     left    Allergies  Allergen Reactions  . Ciprofloxacin Hives  . Azithromycin Itching and Swelling  . Penicillins Swelling    Has patient had a PCN reaction  causing immediate rash, facial/tongue/throat swelling, SOB or lightheadedness with hypotension: Yes Has patient had a PCN reaction causing severe rash involving mucus membranes or skin necrosis: No Has patient had a PCN reaction that required hospitalization No Has patient had a PCN reaction occurring within the last 10 years: No If all of the above answers are "NO", then may proceed with Cephalosporin use.   . Sulfa Antibiotics Itching    No current facility-administered medications on file prior to encounter.    Current Outpatient Medications on File Prior to Encounter  Medication Sig Dispense Refill  . aspirin 325 MG tablet Take 325 mg by mouth daily.    Marland Kitchen buPROPion (WELLBUTRIN XL) 300 MG 24 hr tablet Take 300 mg by mouth daily.     Marland Kitchen CRANBERRY PO Take 1 tablet by mouth daily.    . cyanocobalamin ([employer] VITAMIN B12) 1000 MCG tablet Take 1 tablet by mouth daily.    . cyclobenzaprine (FLEXERIL) 5 MG tablet Take 10 mg by mouth daily.     . diazepam (VALIUM) 5 MG tablet Take 5 mg by mouth 3 (three) times daily.     . Fluticasone-Salmeterol (ADVAIR DISKUS) 250-50 MCG/DOSE AEPB Inhale 1 puff into the lungs 2 (two) times daily.    Marland Kitchen HYDROcodone-acetaminophen (NORCO/VICODIN) 5-325 MG per tablet Take 1 tablet by mouth every 6 (six) hours as needed for moderate pain. (Patient taking differently: Take 1 tablet by mouth every 8 (eight) hours as needed for moderate pain. ) 30 tablet 0  . levofloxacin (LEVAQUIN) 750 MG tablet Take 1 tablet (750 mg total) by mouth daily. 14 tablet 0  . levothyroxine (SYNTHROID, LEVOTHROID) 100 MCG tablet Take 100 mcg by mouth daily. Take with 166mcg tablet for total dose of 257mcg daily    . levothyroxine (SYNTHROID, LEVOTHROID) 175 MCG tablet Take 175 mcg by mouth daily. Take one tablet with 151mcg tablet for total dose of 222mcg    . Multiple Vitamin (MULTIVITAMIN WITH MINERALS) TABS Take 1 tablet by mouth daily.    . nortriptyline (PAMELOR) 25 MG capsule Take 100 mg by  mouth at bedtime.     . potassium chloride SA (KLOR-CON M20) 20 MEQ tablet Take 40 mEq by mouth 4 (four) times daily.     Marland Kitchen topiramate (TOPAMAX) 200 MG tablet Take 200 mg by mouth at bedtime.     . torsemide (DEMADEX) 20 MG tablet Take 10 mg by mouth daily.    . verapamil (CALAN-SR) 240 MG CR tablet Take 240 mg by mouth daily.       Social History   Socioeconomic History  . Marital status: Married    Spouse name: Not on file  . Number of children: Not on file  . Years of education: Not on file  . Highest education level: Not on file  Social Needs  . Emergency planning/management officer  strain: Not on file  . Food insecurity - worry: Not on file  . Food insecurity - inability: Not on file  . Transportation needs - medical: Not on file  . Transportation needs - non-medical: Not on file  Occupational History  . Not on file  Tobacco Use  . Smoking status: Current Every Day Smoker    Packs/day: 0.50    Types: Cigarettes  . Smokeless tobacco: Never Used  Substance and Sexual Activity  . Alcohol use: No  . Drug use: No  . Sexual activity: No  Other Topics Concern  . Not on file  Social History Narrative  . Not on file    Family History  Problem Relation Age of Onset  . COPD Father      Review of Systems:   ROS: GENERAL: No Fever, chills, or diaphoresis. Positive for fatigue/malaise.  HEENT: No nasal discharge or bleeding. No throat pain or swelling. No eye pain or eye redness.  RESPIRATORY: Has cough, wheezing, shortness of breath.  CARDIOVASCULAR: No chest pain or palpitations.  GI: No abdominal pain, nausea, vomiting, diarrhea, constipation, or bloody stool.  NEUROLOGICAL: No headache or focal weakness.  INTEGUMENT: no rashes, itching, or new lesions.  LYMPHATIC SYSTEM: no lymph node swelling or pain.  MUSCULOSKELETAL: Low back pain; otherwise no new joint pain or joint swelling.  GENITOURINARY: Has dysuria. No hematuria.  ENDOCRINE: No polyuria or polydipsia.  HEME: No chronic  anemia, bleeding, or easy bruising.   Physical Exam:  Vitals:   09/30/17 2216 09/30/17 2223 09/30/17 2230 09/30/17 2314  BP:  91/67 (!) 109/56 (!) 94/49  Pulse:  82 81 80  Resp:  (!) 22 20 19   Temp:  98.8 F (37.1 C)    TempSrc:  Oral    SpO2:  95% 94% 94%  Weight: 97.5 kg (215 lb)     Height: 5\' 4"  (1.626 m)       GENERAL: Ill-appearing, well nourished, well-developed.  HEENT: Normocephalic, atraumatic; pupils equal and round. Nares patent, without discharge or bleeding. No oropharyngeal lesions or erythema. Mucous membranes are dry.  NECK: is supple, no masses, trachea midline.  RESPIRATORY: Clear to auscultation bilaterally. Chest wall movements are symmetric. No use of accessory muscles to breathe.  Markedly decreased breath sounds bilaterally. Shallow and slow breathing.  No wheezing, rales, rhonchi. CARDIOVASCULAR: Normal S1, S2. No rubs, or gallops. PMI non-displaced. Carotids: no carotid bruits. No bradycardia or tachycardia. DP pulses 2+ bilaterally.  Edema: trace to 1+  bilaterally.  GI: soft, nontender, non-distended, normal active bowel sounds. No hepatosplenomegaly. Bilateral CVA tenderness.  INTEGUMENT: Clean, dry, and intact. No rashes. MUSCULOSKELETAL: Moving all extremities. No cyanosis. No clubbing. NEUROLOGICAL: Cranial nerves 2-12 grossly intact. Reflexes: 2+ bilaterally. Babinski: toes slightly upgoing bilaterally.  Motor 4/5 throughout upper extremities and 3/5 in lower extremities, symmetric. Sensory grossly intact to light touch. Intact rapid alternating movements bilaterally. No pronator drift. No slurred speech. Frequently somnolent.  PSYCHIATRIC: Fully oriented. Frequently somnolent but when awakened is oriented.  LYMPHATIC: No cervical lymphadenopathy. No supraclavicular lymphadenopathy.   Labs on Admission: I have personally reviewed following labs and imaging studies.  Results for orders placed or performed during the hospital encounter of 09/30/17  (from the past 24 hour(s))  CBG monitoring, ED   Collection Time: 09/30/17 10:29 PM  Result Value Ref Range   Glucose-Capillary 104 (H) 65 - 99 mg/dL  Comprehensive metabolic panel   Collection Time: 09/30/17 10:51 PM  Result Value Ref Range   Sodium  139 135 - 145 mmol/L   Potassium 4.0 3.5 - 5.1 mmol/L   Chloride 113 (H) 101 - 111 mmol/L   CO2 20 (L) 22 - 32 mmol/L   Glucose, Bld 103 (H) 65 - 99 mg/dL   BUN 19 6 - 20 mg/dL   Creatinine, Ser 1.14 (H) 0.44 - 1.00 mg/dL   Calcium 9.2 8.9 - 10.3 mg/dL   Total Protein 6.4 (L) 6.5 - 8.1 g/dL   Albumin 3.0 (L) 3.5 - 5.0 g/dL   AST 16 15 - 41 U/L   ALT 17 14 - 54 U/L   Alkaline Phosphatase 80 38 - 126 U/L   Total Bilirubin 0.5 0.3 - 1.2 mg/dL   GFR calc non Af Amer 49 (L) >60 mL/min   GFR calc Af Amer 56 (L) >60 mL/min   Anion gap 6 5 - 15  CBC   Collection Time: 09/30/17 10:51 PM  Result Value Ref Range   WBC 6.2 4.0 - 10.5 K/uL   RBC 3.84 (L) 3.87 - 5.11 MIL/uL   Hemoglobin 11.8 (L) 12.0 - 15.0 g/dL   HCT 37.3 36.0 - 46.0 %   MCV 97.1 78.0 - 100.0 fL   MCH 30.7 26.0 - 34.0 pg   MCHC 31.6 30.0 - 36.0 g/dL   RDW 13.8 11.5 - 15.5 %   Platelets 179 150 - 400 K/uL  Blood culture (routine x 2)   Collection Time: 09/30/17 10:52 PM  Result Value Ref Range   Specimen Description RIGHT ANTECUBITAL    Special Requests      BOTTLES DRAWN AEROBIC AND ANAEROBIC Blood Culture adequate volume   Culture PENDING    Report Status PENDING   Blood culture (routine x 2)   Collection Time: 09/30/17 11:30 PM  Result Value Ref Range   Specimen Description LEFT ANTECUBITAL    Special Requests      BOTTLES DRAWN AEROBIC AND ANAEROBIC Blood Culture adequate volume   Culture PENDING    Report Status PENDING   Lactic acid, plasma   Collection Time: 09/30/17 11:30 PM  Result Value Ref Range   Lactic Acid, Venous 0.9 0.5 - 1.9 mmol/L     Radiological Exams on Admission:  DG Chest 2 View  Final Result     CXR, viewed  personally: FINDINGS: Stable right hemidiaphragm elevation. No airspace consolidation. No pleural effusions. Normal pulmonary vasculature. Hilar, mediastinal and cardiac contours are unremarkable and unchanged.  IMPRESSION: No active cardiopulmonary disease.   EKG: Independently reviewed.  82 bpm. Sinus rhythm. Non-specific intraventricular conduction delay. Non-specific T wave abnormalities.  Assessment/Plan  Bacteremia Due to E coli and Enterococcus, both of which were found on blood culture x 1. Plan: Resume IV antibiotic. Repeat blood cultures. Likely will need arrangements for long term antibiotic IV.  Renal calculus, right Still with severe pain. Left AMA 09/29/17 and returned 09/30/17 due to pain. Plan: IV morphine PRN. May need to consult urologist again.  Acute metabolic encephalopathy Not consistent with acute stroke; lower extremity weakness bilaterally is chronic. Slurred speech resolved. Likely due to acute illness with multiple sedating medications; has valium at home that can be taken tid. Plan: Neuro checks. Treat UTI and bacteremia. Reduce dosing and frequency of valium. Early ambulation (needs wheelchair at baseline).  UTI (urinary tract infection) Plan: Re-culture. Restart IV antibiotic.   Shortness of breath On admission has shallow breathing. Likely multifactorial. Is a smoker. Plan: O2 by [place]. Avoid sedating medications. Incentive spirometry. Consider trial of nebs.  DVT prophylaxis: Lovenox.  Code Status:  Full   Family Communication: In detail with husband at bedside. Disposition Plan: Admit. Consults called: None. Admission status: Inpatient   Patient requires admission due to risks, which include: Death, sepsis, worsening pain and suffering.   Tacey Ruiz MD [facility] Pager [phone]

## 2017-10-01 NOTE — Assessment & Plan Note (Signed)
Not consistent with acute stroke; lower extremity weakness bilaterally is chronic. Slurred speech resolved. Likely due to acute illness with multiple sedating medications; has valium at home that can be taken tid. Plan: Neuro checks. Treat UTI and bacteremia. Reduce dosing and frequency of valium. Early ambulation (needs wheelchair at baseline).

## 2017-10-01 NOTE — Anesthesia Postprocedure Evaluation (Signed)
Anesthesia Post Note  Patient: Courtney Orozco  Procedure(s) Performed: CYSTOSCOPY WITH STENT PLACEMENT (Right )  Patient location during evaluation: Nursing Unit Anesthesia Type: MAC Level of consciousness: awake and alert Pain management: satisfactory to patient Vital Signs Assessment: post-procedure vital signs reviewed and stable Respiratory status: spontaneous breathing Cardiovascular status: stable Postop Assessment: no apparent nausea or vomiting Anesthetic complications: no     Last Vitals:  Vitals:   09/29/17 0345 09/29/17 1120  BP: (!) 101/58   Pulse: 100   Resp: 20   Temp: 36.9 C   SpO2: 95% 94%    Last Pain:  Vitals:   09/29/17 1148  TempSrc:   PainSc: 2                  Chanley Mcenery

## 2017-10-01 NOTE — Progress Notes (Signed)
PROGRESS NOTE    Courtney Orozco  KGM:[id number]  DOB: September 12, 1951  DOA: 09/30/2017 PCP: Galen Manila, MD   Brief Admission Hx: The patient is a 66 yo right handed woman recently admitted to the hospital (09/28/17 - 09/29/17) with UTI found to have bacteremia due to E coli, which grew in blood culture, who left against medical advice just yesterday, and now presents back today with continued symptoms of pain and new symptom of altered mental status.  MDM/Assessment & Plan:   1. E coli bacteremia - secondary to UTI, recent obstructed right ureteral stone and stent placement - await sensitivities, continue IV antibiotics for now, Pt had been discharged on oral levofloxacin and her WBC has come down from 29 to 6.  I believe she likely could resume the oral levofloxacin at discharge to complete the full 14 day course of treatment and follow up with urology Dr. Diona Fanti as recommended.  2. Right renal calculus - Pt has stent in place.  IV morphine for pain.  3. Acute metabolic encephalopathy - Improved with supportive care, continue to treat infections. Try to minimize sedating medications as possible. No s/s of CVA. CT brain no acute findings.  4. Presumed E coli UTI - former culture still pending, recultured on admission, follow.   5. SOB - supportive therapy. Likely from COPD. Supplemental oxygen.   6. Gait instability - PT evaluation requested. Reported to need wheelchair at baseline.  7. Hematuria - secondary to recent stent placement and UTI.   DVT prophylaxis: SCDs Code Status: Full  Family Communication: husband Disposition Plan: Not medically ready for discharge today   Subjective: Pt already talking about going home but she is unsteady on her feet, mentally she is improving to baseline  Objective: Vitals:   10/01/17 0111 10/01/17 0233 10/01/17 0235 10/01/17 0508  BP: 118/76 135/60  (!) 135/53  Pulse: 81 84  100  Resp: 19   18  Temp: 98.1 F (36.7 C) 97.6 F (36.4 C)  98.2  F (36.8 C)  TempSrc: Oral Oral  Oral  SpO2: 95% 94% 98% 98%  Weight:  97.9 kg (215 lb 13.3 oz)    Height:  5\' 10"  (1.778 m)      Intake/Output Summary (Last 24 hours) at 10/01/2017 1014 Last data filed at 10/01/2017 0734 Gross per 24 hour  Intake 290 ml  Output 650 ml  Net -360 ml   Filed Weights   09/30/17 2216 10/01/17 0233  Weight: 97.5 kg (215 lb) 97.9 kg (215 lb 13.3 oz)     REVIEW OF SYSTEMS  As per history otherwise all reviewed and reported negative  Exam:  General exam: awake, alert, NAD. Cooperative.  Respiratory system: Clear. No increased work of breathing. Cardiovascular system: S1 & S2 heard. No JVD, murmurs, gallops, clicks or pedal edema. Gastrointestinal system: Abdomen is nondistended, soft and nontender. Normal bowel sounds heard. Central nervous system: Alert and oriented x3. No focal neurological deficits. Extremities: no CCE.  Data Reviewed: Basic Metabolic Panel: Recent Labs  Lab 09/28/17 2000 09/29/17 0651 09/30/17 2251 10/01/17 0650  NA 137 137 139 137  K 3.5 3.5 4.0 3.6  CL 109 112* 113* 112*  CO2 18* 18* 20* 18*  GLUCOSE 141* 119* 103* 111*  BUN 14 16 19 16   CREATININE 1.00 1.01* 1.14* 1.10*  CALCIUM 9.1 8.2* 9.2 8.6*   Liver Function Tests: Recent Labs  Lab 09/28/17 2000 09/29/17 0651 09/30/17 2251  AST 20 16 16   ALT 18 15  17  ALKPHOS 95 79 80  BILITOT 0.8 0.7 0.5  PROT 7.0 5.8* 6.4*  ALBUMIN 3.7 2.9* 3.0*   No results for input(s): LIPASE, AMYLASE in the last 168 hours. No results for input(s): AMMONIA in the last 168 hours. CBC: Recent Labs  Lab 09/28/17 2000 09/29/17 0651 09/30/17 2251 10/01/17 0650  WBC 17.7* 29.2* 6.2 4.9  NEUTROABS 15.2*  --   --   --   HGB 14.1 12.7 11.8* 11.7*  HCT 43.5 40.0 37.3 37.5  MCV 94.6 95.9 97.1 95.7  PLT 230 224 179 167   Cardiac Enzymes: No results for input(s): CKTOTAL, CKMB, CKMBINDEX, TROPONINI in the last 168 hours. CBG (last 3)  Recent Labs    09/29/17 0844  09/29/17 1156 09/30/17 2229  GLUCAP 127* 128* 104*   Recent Results (from the past 240 hour(s))  Culture, blood (Routine x 2)     Status: Abnormal (Preliminary result)   Collection Time: 09/28/17  8:22 PM  Result Value Ref Range Status   Specimen Description BLOOD RIGHT HAND  Final   Special Requests   Final    BOTTLES DRAWN AEROBIC ONLY Blood Culture results may not be optimal due to an excessive volume of blood received in culture bottles   Culture  Setup Time   Final    GRAM NEGATIVE RODS Gram Stain Report Called to,Read Back By and Verified With: HARRIS,B @ 2155 ON 09/29/17 BY JUW AEROBIC BOTTLE ONLY Performed at [facility], READ BACK BY AND VERIFIED WITH: Avie Arenas 0448 09/30/2017 T. TYSOR    Culture (A)  Final    ESCHERICHIA COLI SUSCEPTIBILITIES TO FOLLOW Performed at [facility], [address] [place], [place] [address]    Report Status PENDING  Incomplete  Blood Culture ID Panel (Reflexed)     Status: Abnormal   Collection Time: 09/28/17  8:22 PM  Result Value Ref Range Status   Enterococcus species NOT DETECTED NOT DETECTED Final   Listeria monocytogenes NOT DETECTED NOT DETECTED Final   Staphylococcus species NOT DETECTED NOT DETECTED Final   Staphylococcus aureus NOT DETECTED NOT DETECTED Final   Streptococcus species NOT DETECTED NOT DETECTED Final   Streptococcus agalactiae NOT DETECTED NOT DETECTED Final   Streptococcus pneumoniae NOT DETECTED NOT DETECTED Final   Streptococcus pyogenes NOT DETECTED NOT DETECTED Final   Acinetobacter baumannii NOT DETECTED NOT DETECTED Final   Enterobacteriaceae species DETECTED (A) NOT DETECTED Final    Comment: Enterobacteriaceae represent a large family of gram-negative bacteria, not a single organism. CRITICAL RESULT CALLED TO, READ BACK BY AND VERIFIED WITH: M. STAMEAUGH,MD 0448 09/30/2017 T. TYSOR    Enterobacter cloacae complex NOT DETECTED NOT DETECTED Final    Escherichia coli DETECTED (A) NOT DETECTED Final    Comment: CRITICAL RESULT CALLED TO, READ BACK BY AND VERIFIED WITH: M. STAMEAUGH,MD 0448 09/30/2017 T. TYSOR    Klebsiella oxytoca NOT DETECTED NOT DETECTED Final   Klebsiella pneumoniae NOT DETECTED NOT DETECTED Final   Proteus species NOT DETECTED NOT DETECTED Final   Serratia marcescens NOT DETECTED NOT DETECTED Final   Carbapenem resistance NOT DETECTED NOT DETECTED Final   Haemophilus influenzae NOT DETECTED NOT DETECTED Final   Neisseria meningitidis NOT DETECTED NOT DETECTED Final   Pseudomonas aeruginosa NOT DETECTED NOT DETECTED Final   Candida albicans NOT DETECTED NOT DETECTED Final   Candida glabrata NOT DETECTED NOT DETECTED Final   Candida krusei NOT DETECTED NOT DETECTED Final   Candida parapsilosis  NOT DETECTED NOT DETECTED Final   Candida tropicalis NOT DETECTED NOT DETECTED Final    Comment: Performed at [facility], [place] [address] [place], [place] [address]  Culture, blood (Routine x 2)     Status: None (Preliminary result)   Collection Time: 09/28/17  8:28 PM  Result Value Ref Range Status   Specimen Description BLOOD RIGHT ARM  Final   Special Requests   Final    BOTTLES DRAWN AEROBIC ONLY Blood Culture results may not be optimal due to an excessive volume of blood received in culture bottles   Culture NO GROWTH 2 DAYS  Final   Report Status PENDING  Incomplete  Blood culture (routine x 2)     Status: None (Preliminary result)   Collection Time: 09/30/17 10:52 PM  Result Value Ref Range Status   Specimen Description RIGHT ANTECUBITAL  Final   Special Requests   Final    BOTTLES DRAWN AEROBIC AND ANAEROBIC Blood Culture adequate volume   Culture PENDING  Incomplete   Report Status PENDING  Incomplete  Blood culture (routine x 2)     Status: None (Preliminary result)   Collection Time: 09/30/17 11:30 PM  Result Value Ref Range Status   Specimen Description LEFT ANTECUBITAL  Final   Special Requests    Final    BOTTLES DRAWN AEROBIC AND ANAEROBIC Blood Culture adequate volume   Culture PENDING  Incomplete   Report Status PENDING  Incomplete     Studies: Dg Chest 2 View  Result Date: 09/30/2017 CLINICAL DATA:  Altered mental status.  Recent hospital discharge. EXAM: CHEST  2 VIEW COMPARISON:  09/28/2017 FINDINGS: Stable right hemidiaphragm elevation. No airspace consolidation. No pleural effusions. Normal pulmonary vasculature. Hilar, mediastinal and cardiac contours are unremarkable and unchanged. IMPRESSION: No active cardiopulmonary disease. Electronically Signed   By: Andreas Newport M.D.   On: 09/30/2017 22:42   Ct Head Wo Contrast  Result Date: 10/01/2017 CLINICAL DATA:  66 y/o  F; altered mental status. EXAM: CT HEAD WITHOUT CONTRAST TECHNIQUE: Contiguous axial images were obtained from the base of the skull through the vertex without intravenous contrast. COMPARISON:  None. FINDINGS: Brain: No evidence of acute infarction, hemorrhage, hydrocephalus, extra-axial collection or mass lesion/mass effect. Cavum septum pellucidum. Mild brain parenchymal volume loss. Vascular: Calcific atherosclerosis of carotid siphons. No hyperdense vessel identified. Skull: Normal. Negative for fracture or focal lesion. Sinuses/Orbits: No acute finding. Other: None. IMPRESSION: 1. No acute intracranial abnormality identified. 2. Mild brain parenchymal volume loss. Electronically Signed   By: Kristine Garbe M.D.   On: 10/01/2017 01:45     Scheduled Meds: . aspirin  325 mg Oral Daily  . buPROPion  300 mg Oral Daily  . fluticasone furoate-vilanterol  1 puff Inhalation Daily  . levothyroxine  100 mcg Oral QAC breakfast  . levothyroxine  175 mcg Oral QAC breakfast  . mouth rinse  15 mL Mouth Rinse BID  . multivitamin with minerals  1 tablet Oral Daily  . nortriptyline  100 mg Oral QHS  . potassium chloride SA  40 mEq Oral QID  . sodium chloride flush  3 mL Intravenous Q12H  . topiramate  200  mg Oral QHS  . torsemide  10 mg Oral Daily  . verapamil  240 mg Oral Daily  . cyanocobalamin  1,000 mcg Oral Daily   Continuous Infusions: . sodium chloride    . aztreonam Stopped (10/01/17 0919)    Principal Problem:   Bacteremia Active Problems:  Renal calculus, right   UTI (urinary tract infection)   Acute metabolic encephalopathy   Shortness of breath   Time spent:   Irwin Brakeman, MD, FAAFP [facility] Pager [phone]  If 7PM-7AM, please contact night-coverage [email or web address] Password [email or web address] 10/01/2017, 10:14 AM    LOS: 0 days

## 2017-10-01 NOTE — Progress Notes (Signed)
Swallow screen done, pt passed with no coughing or s/s aspiration

## 2017-10-01 NOTE — Progress Notes (Signed)
Pt stated she felt like she needed to go to the bathroom. Rn reminded pt of purewick catheter on, and pt stated ok. When RN came back in room, there was nothing in the canister and pt was not wet. RN bladder scanned pt and it stated there was >464 in bladder.  Pt up to [facility] to try and use bathroom. Will continue to monitor pt

## 2017-10-01 NOTE — Progress Notes (Signed)
After getting pt up to [facility], pt urinated 550 reddish orange urine. Urinalysis/urine culture sent per MD order. Dr Dena Billet paged and made aware of urine color. Purewick discontinued d/t pt being able to tell when she has to go and being able to get up x1 assist. Will continue to monitor pt

## 2017-10-01 NOTE — Progress Notes (Signed)
After speaking with Dr. Dena Billet about pts urine, MD stated she would d/c pts Lovenox, she also wanted a urinalysis to be sent down. Adv that urine is being sent now. Will continue to monitor

## 2017-10-01 NOTE — Assessment & Plan Note (Signed)
Due to E coli and Enterococcus, both of which were found on blood culture x 1. Plan: Resume IV antibiotic. Repeat blood cultures. Likely will need arrangements for long term antibiotic IV.

## 2017-10-01 NOTE — Assessment & Plan Note (Signed)
Plan: Re-culture. Restart IV antibiotic.

## 2017-10-01 NOTE — Assessment & Plan Note (Signed)
Still with severe pain. Left AMA 09/29/17 and returned 09/30/17 due to pain. Plan: IV morphine PRN. May need to consult urologist again.

## 2017-10-01 NOTE — Progress Notes (Signed)
10/01/2017 10:27 AM  Pt refusing to take verapamil, saying that she doesn't take that anymore.  Will ask pharmacist to recheck home med rec and will DC medication.   Murvin Natal MD

## 2017-10-01 NOTE — Addendum Note (Signed)
Addendum  created 10/01/17 1440 by Vista Deck, CRNA   Sign clinical note

## 2017-10-02 ENCOUNTER — Encounter (HOSPITAL_COMMUNITY): Payer: Self-pay | Admitting: Urology

## 2017-10-02 DIAGNOSIS — N200 Calculus of kidney: Secondary | ICD-10-CM

## 2017-10-02 DIAGNOSIS — N3001 Acute cystitis with hematuria: Secondary | ICD-10-CM

## 2017-10-02 DIAGNOSIS — R0602 Shortness of breath: Secondary | ICD-10-CM

## 2017-10-02 LAB — COMPREHENSIVE METABOLIC PANEL
ALBUMIN: 2.6 g/dL — AB (ref 3.5–5.0)
ALT: 24 U/L (ref 14–54)
ANION GAP: 7 (ref 5–15)
AST: 24 U/L (ref 15–41)
Alkaline Phosphatase: 80 U/L (ref 38–126)
BILIRUBIN TOTAL: 0.5 mg/dL (ref 0.3–1.2)
BUN: 16 mg/dL (ref 6–20)
CO2: 20 mmol/L — AB (ref 22–32)
Calcium: 8.8 mg/dL — ABNORMAL LOW (ref 8.9–10.3)
Chloride: 110 mmol/L (ref 101–111)
Creatinine, Ser: 1.02 mg/dL — ABNORMAL HIGH (ref 0.44–1.00)
GFR calc Af Amer: >60 mL/min (ref >60)
GFR calc non Af Amer: 56 mL/min — ABNORMAL LOW (ref >60)
GLUCOSE: 119 mg/dL — AB (ref 65–99)
POTASSIUM: 3.9 mmol/L (ref 3.5–5.1)
SODIUM: 137 mmol/L (ref 135–145)
TOTAL PROTEIN: 5.8 g/dL — AB (ref 6.5–8.1)

## 2017-10-02 LAB — CBC WITH DIFFERENTIAL/PLATELET
BASOS ABS: 0 10*3/uL (ref 0.0–0.1)
BASOS PCT: 0 %
EOS ABS: 0.1 10*3/uL (ref 0.0–0.7)
Eosinophils Relative: 2 %
HEMATOCRIT: 38.1 % (ref 36.0–46.0)
HEMOGLOBIN: 12.1 g/dL (ref 12.0–15.0)
Lymphocytes Relative: 33 %
Lymphs Abs: 1.5 10*3/uL (ref 0.7–4.0)
MCH: 30.3 pg (ref 26.0–34.0)
MCHC: 31.8 g/dL (ref 30.0–36.0)
MCV: 95.3 fL (ref 78.0–100.0)
MONO ABS: 0.5 10*3/uL (ref 0.1–1.0)
Monocytes Relative: 11 %
NEUTROS ABS: 2.5 10*3/uL (ref 1.7–7.7)
NEUTROS PCT: 54 %
Platelets: 199 10*3/uL (ref 150–400)
RBC: 4 MIL/uL (ref 3.87–5.11)
RDW: 13.6 % (ref 11.5–15.5)
WBC: 4.7 10*3/uL (ref 4.0–10.5)

## 2017-10-02 LAB — CULTURE, BLOOD (ROUTINE X 2)

## 2017-10-02 MED ORDER — LEVOFLOXACIN 750 MG PO TABS
750.0000 mg | ORAL_TABLET | Freq: Every day | ORAL | Status: DC
  Administered 2017-10-02 – 2017-10-03 (×2): 750 mg via ORAL
  Filled 2017-10-02 (×2): qty 1

## 2017-10-02 NOTE — Progress Notes (Signed)
Upon unhooking pts IV from IV abx, Rn noticed IV leaking. IV redressed, flushed and no leaking noted. Will continue to monitor

## 2017-10-02 NOTE — Plan of Care (Signed)
  No Outcome Acute Rehab PT Goals(only PT should resolve) Patient Will Transfer Sit To/From Stand 10/02/2017 1505 by Geralyn Corwin, PT Flowsheets Taken 10/02/2017 1505  Patient will transfer sit to/from stand with modified independence Pt Will Transfer Bed To Chair/Chair To Bed 10/02/2017 1505 by Geralyn Corwin, PT Flowsheets Taken 10/02/2017 1505  Pt will Transfer Bed to Chair/Chair to Bed with modified independence Pt Will Perform Standing Balance Or Pre-Gait 10/02/2017 1505 by [place], [place] Taken 10/02/2017 1505  Pt will perform standing balance or pre-gait  with Supervision;with min guard assist;with no UE support Pt Will Ambulate 10/02/2017 1505 by Powell, [place] Taken 10/02/2017 1505  Pt will Ambulate > 125 feet;with modified independence;with least restrictive assistive device    Geraldine Solar PT, DPT

## 2017-10-02 NOTE — Evaluation (Signed)
Physical Therapy Evaluation Patient Details Name: Courtney Orozco MRN: [id number] DOB: 07-02-1951 Today's Date: 10/02/2017   History of Present Illness  The patient is a 66 yo right handed woman recently admitted to the hospital (09/28/17 - 09/29/17) with UTI found to have bacteremia due to E coli, which grew in blood culture, who left against medical advice just yesterday, and now presents back today with continued symptoms of pain and new symptom of altered mental status.  Clinical Impression  Pt received in chair and was agreeable to PT evaluation. Pt from home with husband and grandchildren where she was independent with dressing, bathing, driving, and ambulating community distances with RW/support (i.e. shopping cart). Pt reports 2 falls in the last 6 months which occurred when she lost her balance after she felt herself "drifting to the right" while walking. When this feeling occurs, she states that in order to prevent her from falling, she needs either someone beside her or she uses a wall to hold herself up. Pt currently supervision for transfers and gait with RW. She ambulated 32ft with RW at a below average pace, with intermittent shuffling and cues to correct. Pt also c/o chronic LBP. PT recommends OPPT upon d/c to address pt's LBP, generalized weakness, balance, and gait deviations in order to decrease her risk for falls and maximize her overall function at home and in the community. Will continue to follow acutely to address impairments listed below.     Follow Up Recommendations Outpatient PT    Equipment Recommendations  None recommended by PT    Recommendations for Other Services       Precautions / Restrictions Precautions Precautions: Fall Precaution Comments: 2 falls in the last 6 months, they occur when she has started to drift R after walking. She states that she can tell she when she is about to drift R because she will get a funny feeling in her head. If there is a wall or  someone with her, she will use either to help hold her up until she can get to a chair and rest to let the episode pass. If she does not have support, that's when she falls.  Restrictions Weight Bearing Restrictions: No      Mobility  Bed Mobility               General bed mobility comments: n/a - pt received in chair and wished to return to chair at EOS.  Transfers Overall transfer level: Needs assistance Equipment used: Rolling walker (2 wheeled) Transfers: Sit to/from Stand Sit to Stand: Supervision            Ambulation/Gait Ambulation/Gait assistance: Supervision Ambulation Distance (Feet): 300 Feet Assistive device: Rolling walker (2 wheeled) Gait Pattern/deviations: Decreased stride length;Shuffle Gait velocity: slowed but steady Gait velocity interpretation: Below normal speed for age/gender General Gait Details: intermittent shuffling of gait, decreased stride length throughout; slow but steady gait  Stairs            Wheelchair Mobility    Modified Rankin (Stroke Patients Only)       Balance Overall balance assessment: Needs assistance Sitting-balance support: Feet supported Sitting balance-Leahy Scale: Good     Standing balance support: Bilateral upper extremity supported Standing balance-Leahy Scale: Fair                               Pertinent Vitals/Pain Pain Assessment: 0-10 Pain Score: 7  Pain Location: low back  pain Pain Descriptors / Indicators: Aching Pain Intervention(s): Limited activity within patient's tolerance;Monitored during session;Repositioned    Home Living Family/patient expects to be discharged to:: Private residence Living Arrangements: Spouse/significant other Available Help at Discharge: Family Type of Home: House Home Access: Level entry(1 small threshold)     Home Layout: One level Home Equipment: Grab bars - tub/shower;Walker - 2 wheels;Cane - single point      Prior Function Level of  Independence: Independent with assistive device(s)         Comments: Pt states that she was able to ambulate by herself with her RW but states that she can feel herself drifting to the R when ambulating. Independent with dressing, bathing, driving, states she is able to go grocery shopping independently as long as she has something to hold on to     Hand Dominance   Dominant Hand: Right    Extremity/Trunk Assessment   Upper Extremity Assessment Upper Extremity Assessment: Overall WFL for tasks assessed    Lower Extremity Assessment Lower Extremity Assessment: Generalized weakness    Cervical / Trunk Assessment Cervical / Trunk Assessment: Kyphotic  Communication   Communication: No difficulties  Cognition Arousal/Alertness: Awake/alert Behavior During Therapy: WFL for tasks assessed/performed Overall Cognitive Status: Within Functional Limits for tasks assessed                                        General Comments      Exercises     Assessment/Plan    PT Assessment Patient needs continued PT services  PT Problem List Decreased strength;Decreased activity tolerance;Decreased balance;Decreased mobility;Pain       PT Treatment Interventions DME instruction;Gait training;Stair training;Functional mobility training;Therapeutic activities;Therapeutic exercise;Balance training;Neuromuscular re-education;Patient/family education;Manual techniques    PT Goals (Current goals can be found in the Care Plan section)  Acute Rehab PT Goals Patient Stated Goal: to go home PT Goal Formulation: With patient Time For Goal Achievement: 10/09/17 Potential to Achieve Goals: Good    Frequency Min 3X/week   Barriers to discharge        Co-evaluation               AM-PAC PT "6 Clicks" Daily Activity  Outcome Measure Difficulty turning over in bed (including adjusting bedclothes, sheets and blankets)?: None Difficulty moving from lying on back to  sitting on the side of the bed? : None Difficulty sitting down on and standing up from a chair with arms (e.g., wheelchair, bedside commode, etc,.)?: None Help needed moving to and from a bed to chair (including a wheelchair)?: A Little Help needed walking in hospital room?: A Little Help needed climbing 3-5 steps with a railing? : A Lot 6 Click Score: 20    End of Session Equipment Utilized During Treatment: Gait belt Activity Tolerance: Patient tolerated treatment well;No increased pain Patient left: in chair;with call bell/phone within reach Nurse Communication: Mobility status PT Visit Diagnosis: Unsteadiness on feet (R26.81);Other abnormalities of gait and mobility (R26.89);Repeated falls (R29.6);Muscle weakness (generalized) (M62.81);History of falling (Z91.81);Difficulty in walking, not elsewhere classified (R26.2)    Time: 3220-2542 PT Time Calculation (min) (ACUTE ONLY): 38 min   Charges:   PT Evaluation $PT Eval Low Complexity: 1 Low PT Treatments $Gait Training: 8-22 mins   PT G Codes:   PT G-Codes **NOT FOR INPATIENT CLASS** Functional Assessment Tool Used: AM-PAC 6 Clicks Basic Mobility Functional Limitation: Mobility: Walking and moving  around Mobility: Walking and Moving Around Current Status [phone]): At least 40 percent but less than 60 percent impaired, limited or restricted Mobility: Walking and Moving Around Goal Status [phone]): At least 20 percent but less than 40 percent impaired, limited or restricted     Geraldine Solar PT, DPT

## 2017-10-02 NOTE — Care Management Note (Signed)
Case Management Note  Patient Details  Name: Courtney Orozco MRN: [id number] Date of Birth: [birth year]  Subjective/Objective:    Admitted with AMS. Pt from home, lives with husband. Pt ind at baseline. Pt has all necessary DME pta. Her husband works at [employer] and gets off at [employer] during the day. She has PCP, transportation and insurance with drug coverage. She has been recommended for OP PT. Pt agreeable, she has requests [place] facility as she has had bad experience at OP rehab in [place].   Action/Plan: Will DC home with self care, possibly tomorrow. Referral faxed to [facility].                Expected Discharge Date:     10/03/2017             Expected Discharge Plan:    Home with self care  In-House Referral:   NA  Discharge planning Services   CM  Post Acute Care Choice:   NA Choice offered to:   NA  Status of Service:   Completed.   Additional Comments:  Sherald Barge, RN 10/02/2017, 3:22 PM

## 2017-10-02 NOTE — Progress Notes (Addendum)
PROGRESS NOTE   Courtney Orozco  VQQ:[id number]  DOB: Mar 02, 1951  DOA: 09/30/2017 PCP: Galen Manila, MD   Brief Admission Hx: The patient is a 66 yo right handed woman recently admitted to the hospital (09/28/17 - 09/29/17) with UTI found to have bacteremia due to E coli, which grew in blood culture, who left against medical advice just yesterday, and now presents back today with continued symptoms of pain and new symptom of altered mental status.  MDM/Assessment & Plan:   1. E coli bacteremia - secondary to UTI, recent obstructed right ureteral stone and stent placement - pansensitive infection see culture results, DC aztreonam and restart oral levofloxacin 750 mg daily.   Pt had been discharged on oral levofloxacin for 14 days just prior to admission.  Pt and daughter says that she has taken levofloxacin before and tolerated.  She has multiple drug allergies and intolerances to antibiotics.  I believe she likely could resume the oral levofloxacin at discharge to complete the full course of treatment and follow up with urology Dr. Diona Fanti as recommended to address that stone and stent.  2. Right renal calculus - Pt has stent in place.  IV morphine for pain.  3. Acute metabolic encephalopathy - Improved with supportive care, continue to treat infections. Try to minimize sedating medications as possible. No s/s of CVA. CT brain no acute findings.   I would recommend she reduce and minimize sedating meds at discharge like the flexeril and valium that she had been taking.  Pharmacy says that topamax can also cause encephalopathy.  I hesitate to stop that at this time.  Would try to reduce the sedating meds first.  She should follow up with her doctors outpatient for further outpatient management after discharge.  She may even benefit from an outpatient neurology follow up appointment to be tested for dementia, etc.  She has responded well to supportive care.  GCS is 20.    4. Presumed E coli UTI -  former culture still pending, recultured on admission. Treating as above.    5. SOB - supportive therapy. Likely from COPD. Supplemental oxygen.   6. Gait instability - PT evaluation requested and still pending. Reported to need wheelchair at baseline.  7. Hematuria - secondary to recent stent placement and UTI.   DVT prophylaxis: SCDs Code Status: Full  Family Communication: husband Disposition Plan: Home tomorrow if stable  Subjective: Pt anxious to go home.  She is more alert and communicating today.  She is not ambulating well and unsteady on feet.  PT eval pending.   Objective: Vitals:   10/01/17 1508 10/01/17 2000 10/02/17 0500 10/02/17 0955  BP: (!) 101/52 (!) 124/43 (!) 138/58   Pulse: 84 81 80   Resp: 18 20 20    Temp: 98.1 F (36.7 C) (!) 97.5 F (36.4 C) (!) 97.5 F (36.4 C)   TempSrc: Oral Oral Oral   SpO2: 98% 100% 95% 94%  Weight:   98.3 kg (216 lb 11.4 oz)   Height:        Intake/Output Summary (Last 24 hours) at 10/02/2017 1101 Last data filed at 10/02/2017 0900 Gross per 24 hour  Intake 1173 ml  Output 1750 ml  Net -577 ml   Filed Weights   09/30/17 2216 10/01/17 0233 10/02/17 0500  Weight: 97.5 kg (215 lb) 97.9 kg (215 lb 13.3 oz) 98.3 kg (216 lb 11.4 oz)   REVIEW OF SYSTEMS  As per history otherwise all reviewed and reported negative  Exam:  General exam: awake, alert, NAD. Cooperative.  Respiratory system: Clear. No increased work of breathing. Cardiovascular system: S1 & S2 heard. No JVD, murmurs, gallops, clicks or pedal edema. Gastrointestinal system: Abdomen is nondistended, soft and nontender. Normal bowel sounds heard. Central nervous system: Alert and oriented x3. No focal neurological deficits. Extremities: no CCE.  Data Reviewed: Basic Metabolic Panel: Recent Labs  Lab 09/28/17 2000 09/29/17 0651 09/30/17 2251 10/01/17 0650 10/02/17 0414  NA 137 137 139 137 137  K 3.5 3.5 4.0 3.6 3.9  CL 109 112* 113* 112* 110  CO2 18* 18*  20* 18* 20*  GLUCOSE 141* 119* 103* 111* 119*  BUN 14 16 19 16 16   CREATININE 1.00 1.01* 1.14* 1.10* 1.02*  CALCIUM 9.1 8.2* 9.2 8.6* 8.8*   Liver Function Tests: Recent Labs  Lab 09/28/17 2000 09/29/17 0651 09/30/17 2251 10/02/17 0414  AST 20 16 16 24   ALT 18 15 17 24   ALKPHOS 95 79 80 80  BILITOT 0.8 0.7 0.5 0.5  PROT 7.0 5.8* 6.4* 5.8*  ALBUMIN 3.7 2.9* 3.0* 2.6*   No results for input(s): LIPASE, AMYLASE in the last 168 hours. No results for input(s): AMMONIA in the last 168 hours. CBC: Recent Labs  Lab 09/28/17 2000 09/29/17 0651 09/30/17 2251 10/01/17 0650 10/02/17 0414  WBC 17.7* 29.2* 6.2 4.9 4.7  NEUTROABS 15.2*  --   --   --  2.5  HGB 14.1 12.7 11.8* 11.7* 12.1  HCT 43.5 40.0 37.3 37.5 38.1  MCV 94.6 95.9 97.1 95.7 95.3  PLT 230 224 179 167 199   Cardiac Enzymes: No results for input(s): CKTOTAL, CKMB, CKMBINDEX, TROPONINI in the last 168 hours. CBG (last 3)  Recent Labs    09/29/17 1156 09/30/17 2229  GLUCAP 128* 104*   Recent Results (from the past 240 hour(s))  Culture, blood (Routine x 2)     Status: Abnormal   Collection Time: 09/28/17  8:22 PM  Result Value Ref Range Status   Specimen Description BLOOD RIGHT HAND  Final   Special Requests   Final    BOTTLES DRAWN AEROBIC ONLY Blood Culture results may not be optimal due to an excessive volume of blood received in culture bottles   Culture  Setup Time   Final    GRAM NEGATIVE RODS Gram Stain Report Called to,Read Back By and Verified With: HARRIS,B @ 2155 ON 09/29/17 BY JUW AEROBIC BOTTLE ONLY Performed at [place], READ BACK BY AND VERIFIED WITH: Avie Arenas 0448 09/30/2017 Mena Goes Performed at [facility], [place] [address], [place], [place] [address]    Culture ESCHERICHIA COLI (A)  Final   Report Status 10/02/2017 FINAL  Final   Organism ID, Bacteria ESCHERICHIA COLI  Final      Susceptibility   Escherichia coli - MIC*    AMPICILLIN 8  SENSITIVE Sensitive     CEFAZOLIN <=4 SENSITIVE Sensitive     CEFEPIME <=1 SENSITIVE Sensitive     CEFTAZIDIME <=1 SENSITIVE Sensitive     CEFTRIAXONE <=1 SENSITIVE Sensitive     CIPROFLOXACIN <=0.25 SENSITIVE Sensitive     GENTAMICIN <=1 SENSITIVE Sensitive     IMIPENEM <=0.25 SENSITIVE Sensitive     TRIMETH/SULFA <=20 SENSITIVE Sensitive     AMPICILLIN/SULBACTAM <=2 SENSITIVE Sensitive     PIP/TAZO <=4 SENSITIVE Sensitive     Extended ESBL NEGATIVE Sensitive     * ESCHERICHIA COLI  Blood Culture ID Panel (Reflexed)  Status: Abnormal   Collection Time: 09/28/17  8:22 PM  Result Value Ref Range Status   Enterococcus species NOT DETECTED NOT DETECTED Final   Listeria monocytogenes NOT DETECTED NOT DETECTED Final   Staphylococcus species NOT DETECTED NOT DETECTED Final   Staphylococcus aureus NOT DETECTED NOT DETECTED Final   Streptococcus species NOT DETECTED NOT DETECTED Final   Streptococcus agalactiae NOT DETECTED NOT DETECTED Final   Streptococcus pneumoniae NOT DETECTED NOT DETECTED Final   Streptococcus pyogenes NOT DETECTED NOT DETECTED Final   Acinetobacter baumannii NOT DETECTED NOT DETECTED Final   Enterobacteriaceae species DETECTED (A) NOT DETECTED Final    Comment: Enterobacteriaceae represent a large family of gram-negative bacteria, not a single organism. CRITICAL RESULT CALLED TO, READ BACK BY AND VERIFIED WITH: M. STAMEAUGH,MD 0448 09/30/2017 T. TYSOR    Enterobacter cloacae complex NOT DETECTED NOT DETECTED Final   Escherichia coli DETECTED (A) NOT DETECTED Final    Comment: CRITICAL RESULT CALLED TO, READ BACK BY AND VERIFIED WITH: M. STAMEAUGH,MD 0448 09/30/2017 T. TYSOR    Klebsiella oxytoca NOT DETECTED NOT DETECTED Final   Klebsiella pneumoniae NOT DETECTED NOT DETECTED Final   Proteus species NOT DETECTED NOT DETECTED Final   Serratia marcescens NOT DETECTED NOT DETECTED Final   Carbapenem resistance NOT DETECTED NOT DETECTED Final   Haemophilus  influenzae NOT DETECTED NOT DETECTED Final   Neisseria meningitidis NOT DETECTED NOT DETECTED Final   Pseudomonas aeruginosa NOT DETECTED NOT DETECTED Final   Candida albicans NOT DETECTED NOT DETECTED Final   Candida glabrata NOT DETECTED NOT DETECTED Final   Candida krusei NOT DETECTED NOT DETECTED Final   Candida parapsilosis NOT DETECTED NOT DETECTED Final   Candida tropicalis NOT DETECTED NOT DETECTED Final    Comment: Performed at [facility], [place]. [address], [place], [place] [address]  Culture, blood (Routine x 2)     Status: None (Preliminary result)   Collection Time: 09/28/17  8:28 PM  Result Value Ref Range Status   Specimen Description BLOOD RIGHT ARM  Final   Special Requests   Final    BOTTLES DRAWN AEROBIC ONLY Blood Culture results may not be optimal due to an excessive volume of blood received in culture bottles   Culture NO GROWTH 4 DAYS  Final   Report Status PENDING  Incomplete  Blood culture (routine x 2)     Status: None (Preliminary result)   Collection Time: 09/30/17 10:52 PM  Result Value Ref Range Status   Specimen Description RIGHT ANTECUBITAL  Final   Special Requests   Final    BOTTLES DRAWN AEROBIC AND ANAEROBIC Blood Culture adequate volume   Culture NO GROWTH 2 DAYS  Final   Report Status PENDING  Incomplete  Blood culture (routine x 2)     Status: None (Preliminary result)   Collection Time: 09/30/17 11:30 PM  Result Value Ref Range Status   Specimen Description LEFT ANTECUBITAL  Final   Special Requests   Final    BOTTLES DRAWN AEROBIC AND ANAEROBIC Blood Culture adequate volume   Culture NO GROWTH 2 DAYS  Final   Report Status PENDING  Incomplete     Studies: Dg Chest 2 View  Result Date: 09/30/2017 CLINICAL DATA:  Altered mental status.  Recent hospital discharge. EXAM: CHEST  2 VIEW COMPARISON:  09/28/2017 FINDINGS: Stable right hemidiaphragm elevation. No airspace consolidation. No pleural effusions. Normal pulmonary vasculature.  Hilar, mediastinal and cardiac contours are unremarkable and unchanged. IMPRESSION: No active cardiopulmonary disease. Electronically  Signed   By: Andreas Newport M.D.   On: 09/30/2017 22:42   Ct Head Wo Contrast  Result Date: 10/01/2017 CLINICAL DATA:  66 y/o  F; altered mental status. EXAM: CT HEAD WITHOUT CONTRAST TECHNIQUE: Contiguous axial images were obtained from the base of the skull through the vertex without intravenous contrast. COMPARISON:  None. FINDINGS: Brain: No evidence of acute infarction, hemorrhage, hydrocephalus, extra-axial collection or mass lesion/mass effect. Cavum septum pellucidum. Mild brain parenchymal volume loss. Vascular: Calcific atherosclerosis of carotid siphons. No hyperdense vessel identified. Skull: Normal. Negative for fracture or focal lesion. Sinuses/Orbits: No acute finding. Other: None. IMPRESSION: 1. No acute intracranial abnormality identified. 2. Mild brain parenchymal volume loss. Electronically Signed   By: Kristine Garbe M.D.   On: 10/01/2017 01:45   Scheduled Meds: . aspirin  325 mg Oral Daily  . buPROPion  300 mg Oral Daily  . fluticasone furoate-vilanterol  1 puff Inhalation Daily  . levothyroxine  100 mcg Oral QAC breakfast  . levothyroxine  175 mcg Oral QAC breakfast  . mouth rinse  15 mL Mouth Rinse BID  . multivitamin with minerals  1 tablet Oral Daily  . nortriptyline  100 mg Oral QHS  . potassium chloride SA  40 mEq Oral QID  . sodium chloride flush  3 mL Intravenous Q12H  . topiramate  200 mg Oral QHS  . torsemide  10 mg Oral Daily  . cyanocobalamin  1,000 mcg Oral Daily   Continuous Infusions: . sodium chloride    . aztreonam Stopped (10/02/17 0654)    Principal Problem:   Bacteremia Active Problems:   Renal calculus, right   UTI (urinary tract infection)   Acute metabolic encephalopathy   Shortness of breath   Time spent:   Irwin Brakeman, MD, FAAFP [facility] Pager [phone]  If 7PM-7AM,  please contact night-coverage [email or web address] Password [email or web address] 10/02/2017, 11:01 AM    LOS: 1 day

## 2017-10-03 DIAGNOSIS — R7881 Bacteremia: Secondary | ICD-10-CM

## 2017-10-03 LAB — URINE CULTURE
Culture: NO GROWTH
Special Requests: NORMAL

## 2017-10-03 LAB — CULTURE, BLOOD (ROUTINE X 2): CULTURE: NO GROWTH

## 2017-10-03 MED ORDER — LEVOFLOXACIN 750 MG PO TABS: 750.0000 mg | ORAL_TABLET | Freq: Every day | ORAL | 0 refills | Status: DC

## 2017-10-03 NOTE — Plan of Care (Signed)
  Progressing Acute Rehab PT Goals(only PT should resolve) Patient Will Transfer Sit To/From Stand 10/03/2017 0946 - Progressing by Geralyn Corwin, PT Flowsheets Taken 10/03/2017 [phone]  Patient will transfer sit to/from stand with supervision Pt Will Transfer Bed To Chair/Chair To Bed 10/03/2017 0946 - Progressing by Geralyn Corwin, PT Pt Will Perform Standing Balance Or Pre-Gait 10/03/2017 0946 - Progressing by Geralyn Corwin, PT Pt Will Ambulate 10/03/2017 0946 - Progressing by Geralyn Corwin, PT     Geraldine Solar PT, DPT

## 2017-10-03 NOTE — Care Management (Signed)
Discharging home today. Pt referred for Emmi transition calls.

## 2017-10-03 NOTE — Progress Notes (Signed)
Removed IV, patient tolerated well, 2x2 gauze and paper tape applied to site. Reviewed discharge instructions with patient and patient's husband. Patient and husband both verbalized understanding. Patient transported home by husband.

## 2017-10-03 NOTE — Care Management Important Message (Signed)
Important Message  Patient Details  Name: Courtney Orozco MRN: [id number] Date of Birth: 02-04-1951   Medicare Important Message Given:  Yes    Sherald Barge, RN 10/03/2017, 3:18 PM

## 2017-10-03 NOTE — Discharge Summary (Signed)
Physician Discharge Summary  Courtney Orozco QIH:[id number] DOB: [birth year] DOA: 09/30/2017  PCP: Galen Manila, MD  Admit date: 09/30/2017 Discharge date: 10/03/2017  Time spent: 45 minutes  Recommendations for Outpatient Follow-up:  -He will be discharged home today. -We will continue 14 days of Levaquin 750 mg daily. -We will need follow-up with urologist, Dr. Diona Fanti in 2 weeks.  Discharge Diagnoses:  Principal Problem:   Bacteremia Active Problems:   Renal calculus, right   UTI (urinary tract infection)   Acute metabolic encephalopathy   Shortness of breath   Discharge Condition: Stable and improved  Filed Weights   10/01/17 0233 10/02/17 0500 10/03/17 0500  Weight: 97.9 kg (215 lb 13.3 oz) 98.3 kg (216 lb 11.4 oz) 92.6 kg (204 lb 3.2 oz)    History of present illness:   As per Dr. Dena Billet on 12/10: The patient is a 66 yo right handed woman recently admitted to the hospital (09/28/17 - 09/29/17) with UTI found to have bacteremia due to E coli, which grew in blood culture, who left against medical advice just yesterday, and now presents back today with continued symptoms of pain and new symptom of altered mental status. Husband came home from work and noticed the patient was slurring words at 2 PM today. Last known normal 8:30 PM last night (09/29/17). She was sleepy and she was talking but her husband could not understand her. Uses walker at baseline, but seemed to have to walk slower. At baseline falls to left. Evaluated in past and was told she likely had a TIA. Did not know date today.  No headache. Slurred speech now resolved but patient still intermittently somnolent. No new focal weakness or numbness. She does have a prescription for valium tid. Other associated symptoms: coughing, shortness of breath.   Regarding the flank pain: Onset: 2 weeks ago started having flank pain, worse starting 3-4 days ago. Duration: intermittent. Timing:  Severity: Pain up to 10/10 at  times. Sharp.  Context: Just discharged for UTI with nephrosis. Location: Right and left flank. Radiation: none Character/Quality: Up to 10/10 at times. Sharp.  Alleviated by: Nothing. Exacerbated by: Nothing. Associated Symptoms: Legs swelling since Saturday, worsening to the point she could not get her shoes on.   Abdomen increased in size.  Fever and chills prior to last admission 09/28/17 but not today.  Treatments: none at home except usual medications. Taking PO Levaquin at home. Husband said she does not have a diagnosis of asthma or COPD, but she has wheezing and shortness of breath sometimes so he lets her use his inhaler, Advair.     Hospital Course:   1. E coli bacteremia - secondary to UTI, recent obstructed right ureteral stone and stent placement - pansensitive infection see culture results, DC aztreonam and restart oral levofloxacin 750 mg daily for 14 days.   Pt had been discharged on oral levofloxacin for 14 days just prior to admission.  Pt and daughter says that she has taken levofloxacin before and tolerated.  She has multiple drug allergies and intolerances to antibiotics.  (She has tolerated levofloxacin well while in the hospital) I believe she likely could resume the oral levofloxacin at discharge to complete the full course of treatment and follow up with urology Dr. Diona Fanti as recommended to address that stone and stent.  2. Right renal calculus - Pt has stent in place.   3. Acute metabolic encephalopathy -resolved with supportive care, continue to treat infections. Try to minimize sedating  medications as possible. No s/s of CVA. CT brain no acute findings.   I would recommend she reduce and minimize sedating meds at discharge like the flexeril and valium that she had been taking.  Would try to reduce the sedating meds first.  She should follow up with her doctors outpatient for further outpatient management after discharge.   She has responded well to supportive care.   GCS is 20.    4. Presumed E coli UTI - former culture still pending, recultured on admission however repeat culture is no growth.. Treating as above.     Treat with Levaquin in response to blood culture data. 5. SOB - supportive therapy. Likely from COPD. Supplemental oxygen.  Resolved. 6. Gait instability - PT evaluation requested and still pending. Reported to need wheelchair at baseline.  7. Hematuria - secondary to recent stent placement and UTI.      Procedures:  None   Consultations:  None  Discharge Instructions  Discharge Instructions    Ambulatory referral to Physical Therapy   Complete by:  As directed    Diet - low sodium heart healthy   Complete by:  As directed    Increase activity slowly   Complete by:  As directed      Allergies as of 10/03/2017      Reactions   Ciprofloxacin Hives   Can take levaquin with no problems per patient and family   Macrobid [nitrofurantoin] Nausea And Vomiting   Azithromycin Itching, Swelling   Penicillins Swelling   Has patient had a PCN reaction causing immediate rash, facial/tongue/throat swelling, SOB or lightheadedness with hypotension: Yes Has patient had a PCN reaction causing severe rash involving mucus membranes or skin necrosis: No Has patient had a PCN reaction that required hospitalization No Has patient had a PCN reaction occurring within the last 10 years: No If all of the above answers are "NO", then may proceed with Cephalosporin use.   Sulfa Antibiotics Itching      Medication List    TAKE these medications   ADVAIR DISKUS 250-50 MCG/DOSE Aepb Generic drug:  Fluticasone-Salmeterol Inhale 1 puff into the lungs 2 (two) times daily.   aspirin 325 MG tablet Take 325 mg by mouth daily.   buPROPion 300 MG 24 hr tablet Commonly known as:  WELLBUTRIN XL Take 300 mg by mouth daily.   CRANBERRY PO Take 1 tablet by mouth daily.   [employer] VITAMIN B12 1000 MCG tablet Generic drug:  cyanocobalamin Take 1 tablet  by mouth daily.   cyclobenzaprine 5 MG tablet Commonly known as:  FLEXERIL Take 10 mg by mouth daily.   diazepam 5 MG tablet Commonly known as:  VALIUM Take 5 mg by mouth 3 (three) times daily.   HYDROcodone-acetaminophen 5-325 MG tablet Commonly known as:  NORCO/VICODIN Take 1 tablet by mouth every 6 (six) hours as needed for moderate pain. What changed:  when to take this   KLOR-CON M20 20 MEQ tablet Generic drug:  potassium chloride SA Take 40 mEq by mouth 4 (four) times daily.   levofloxacin 750 MG tablet Commonly known as:  LEVAQUIN Take 1 tablet (750 mg total) by mouth daily.   levothyroxine 100 MCG tablet Commonly known as:  SYNTHROID, LEVOTHROID Take 100 mcg by mouth daily. Take with 170mcg tablet for total dose of 231mcg daily   levothyroxine 175 MCG tablet Commonly known as:  SYNTHROID, LEVOTHROID Take 175 mcg by mouth daily. Take one tablet with 175mcg tablet for total dose of 264mcg  multivitamin with minerals Tabs tablet Take 1 tablet by mouth daily.   nortriptyline 25 MG capsule Commonly known as:  PAMELOR Take 100 mg by mouth at bedtime.   topiramate 200 MG tablet Commonly known as:  TOPAMAX Take 200 mg by mouth at bedtime.   torsemide 20 MG tablet Commonly known as:  DEMADEX Take 10 mg by mouth daily.      Allergies  Allergen Reactions  . Ciprofloxacin Hives    Can take levaquin with no problems per patient and family  . Macrobid [Nitrofurantoin] Nausea And Vomiting  . Azithromycin Itching and Swelling  . Penicillins Swelling    Has patient had a PCN reaction causing immediate rash, facial/tongue/throat swelling, SOB or lightheadedness with hypotension: Yes Has patient had a PCN reaction causing severe rash involving mucus membranes or skin necrosis: No Has patient had a PCN reaction that required hospitalization No Has patient had a PCN reaction occurring within the last 10 years: No If all of the above answers are "NO", then may proceed  with Cephalosporin use.   Ignacia Bayley Antibiotics Itching   Follow-up Information    Galen Manila, MD. Schedule an appointment as soon as possible for a visit in 3 week(s).   Specialty:  Internal Medicine Contact information: [address] [place] [address] [phone]        Franchot Gallo, MD. Schedule an appointment as soon as possible for a visit in 2 week(s).   Specialty:  Urology Contact information: [address] [place] [address] [phone]            The results of significant diagnostics from this hospitalization (including imaging, microbiology, ancillary and laboratory) are listed below for reference.    Significant Diagnostic Studies: Dg Chest 2 View  Result Date: 09/30/2017 CLINICAL DATA:  Altered mental status.  Recent hospital discharge. EXAM: CHEST  2 VIEW COMPARISON:  09/28/2017 FINDINGS: Stable right hemidiaphragm elevation. No airspace consolidation. No pleural effusions. Normal pulmonary vasculature. Hilar, mediastinal and cardiac contours are unremarkable and unchanged. IMPRESSION: No active cardiopulmonary disease. Electronically Signed   By: Andreas Newport M.D.   On: 09/30/2017 22:42   Dg Chest 2 View  Result Date: 09/28/2017 CLINICAL DATA:  Pain and dysuria.  Fever. EXAM: CHEST  2 VIEW COMPARISON:  January 22, 2017 FINDINGS: There is elevation of the right hemidiaphragm with right base atelectasis. There is no edema or consolidation. The heart size and pulmonary vascularity are normal. No adenopathy. No evident bone lesions. There is colonic interposition between the right hemidiaphragm and liver. IMPRESSION: Elevation of right hemidiaphragm with right base atelectasis. No edema or consolidation. Stable cardiac silhouette. Electronically Signed   By: Lowella Grip III M.D.   On: 09/28/2017 21:08   Ct Head Wo Contrast  Result Date: 10/01/2017 CLINICAL DATA:  66 y/o  F; altered mental status. EXAM: CT HEAD WITHOUT  CONTRAST TECHNIQUE: Contiguous axial images were obtained from the base of the skull through the vertex without intravenous contrast. COMPARISON:  None. FINDINGS: Brain: No evidence of acute infarction, hemorrhage, hydrocephalus, extra-axial collection or mass lesion/mass effect. Cavum septum pellucidum. Mild brain parenchymal volume loss. Vascular: Calcific atherosclerosis of carotid siphons. No hyperdense vessel identified. Skull: Normal. Negative for fracture or focal lesion. Sinuses/Orbits: No acute finding. Other: None. IMPRESSION: 1. No acute intracranial abnormality identified. 2. Mild brain parenchymal volume loss. Electronically Signed   By: Kristine Garbe M.D.   On: 10/01/2017 01:45   Ct Abdomen Pelvis W Contrast  Result Date: 09/28/2017 CLINICAL DATA:  66 year old female with generalized weakness, fever and lower abdominal pain. Difficulty urinating. EXAM: CT ABDOMEN AND PELVIS WITH CONTRAST TECHNIQUE: Multidetector CT imaging of the abdomen and pelvis was performed using the standard protocol following bolus administration of intravenous contrast. CONTRAST:  140mL ISOVUE-300 IOPAMIDOL (ISOVUE-300) INJECTION 61% COMPARISON:  03/14/2015 FINDINGS: Lower chest: Normal heart size with coronary arteriosclerosis. No pericardial effusion. Chronic elevation/eventration of the right hemidiaphragm. Hepatobiliary: The liver surface appears slightly nodular consistent morphologically with cirrhosis. No biliary dilatation. There is gallbladder distention with tiny layering calculi. No wall thickening is identified. There is colonic interposition between the liver and right hemidiaphragm. Pancreas: Unremarkable. No pancreatic ductal dilatation or surrounding inflammatory changes. Spleen: Normal in size without focal abnormality. Adrenals/Urinary Tract: Normal bilateral adrenal glands. Perinephric fat stranding about the right kidney were there is moderate right-sided hydroureteronephrosis secondary to a 4  mm distal right ureteral stone. No obstructive uropathy of the left renal collecting system. No enhancing renal mass. Repeat delayed imaging through both kidneys demonstrates symmetric appearing pyelograms despite the right right-sided renal calculus and hydronephrosis. Small cystocele of the bladder from pelvic floor relaxation. Stomach/Bowel: Physiologic distention of the stomach. Normal small bowel rotation without obstruction. Status post appendectomy. Moderate fecal residue within the colon. No bowel obstruction or inflammation. Vascular/Lymphatic: 3.1 x 3.3 cm infrarenal fusiform aortic aneurysm, slightly increased from prior 2016 exam where it had measured up to 2.7 cm. No lymphadenopathy. Reproductive: Status post hysterectomy. No adnexal masses. Other: No abdominal wall hernia or abnormality. No abdominopelvic ascites. Musculoskeletal: Mild physiologic anterior wedging of T12 with superior endplate Schmorl's node. Degenerative disc disease L4-5 unchanged in appearance. No acute nor suspicious osseous appearing abnormality. IMPRESSION: 1. 4 mm distal right ureteral calculus causing moderate right-sided hydroureteronephrosis and perinephric fat stranding. The calculus is approximately 1 cm from the UVJ. 2. Slight increase in size of fusiform infrarenal aortic aneurysm 3.1 x 3.3 cm versus 2.7 cm in 2016. Recommend followup by ultrasound in 3 years. This recommendation follows ACR consensus guidelines: White Paper of the ACR Incidental Findings Committee II on Vascular Findings. J Am Coll Radiol 2013; 97:026-378 3. Morphologic appearance of cirrhosis. 4. Cholelithiasis without secondary signs of acute cholecystitis. 5. Coronary arteriosclerosis. Electronically Signed   By: Ashley Royalty M.D.   On: 09/28/2017 21:35   Dg C-arm 1-60 Min-no Report  Result Date: 09/29/2017 Fluoroscopy was utilized by the requesting physician.  No radiographic interpretation.    Microbiology: Recent Results (from the past 240  hour(s))  Culture, blood (Routine x 2)     Status: Abnormal   Collection Time: 09/28/17  8:22 PM  Result Value Ref Range Status   Specimen Description BLOOD RIGHT HAND  Final   Special Requests   Final    BOTTLES DRAWN AEROBIC ONLY Blood Culture results may not be optimal due to an excessive volume of blood received in culture bottles   Culture  Setup Time   Final    GRAM NEGATIVE RODS Gram Stain Report Called to,Read Back By and Verified With: HARRIS,B @ 2155 ON 09/29/17 BY JUW AEROBIC BOTTLE ONLY Performed at [facility], READ BACK BY AND VERIFIED WITH: Avie Arenas 0448 09/30/2017 Mena Goes Performed at [facility], [place] [address] [place], [place] [address]    Culture ESCHERICHIA COLI (A)  Final   Report Status 10/02/2017 FINAL  Final   Organism ID, Bacteria ESCHERICHIA COLI  Final      Susceptibility  Escherichia coli - MIC*    AMPICILLIN 8 SENSITIVE Sensitive     CEFAZOLIN <=4 SENSITIVE Sensitive     CEFEPIME <=1 SENSITIVE Sensitive     CEFTAZIDIME <=1 SENSITIVE Sensitive     CEFTRIAXONE <=1 SENSITIVE Sensitive     CIPROFLOXACIN <=0.25 SENSITIVE Sensitive     GENTAMICIN <=1 SENSITIVE Sensitive     IMIPENEM <=0.25 SENSITIVE Sensitive     TRIMETH/SULFA <=20 SENSITIVE Sensitive     AMPICILLIN/SULBACTAM <=2 SENSITIVE Sensitive     PIP/TAZO <=4 SENSITIVE Sensitive     Extended ESBL NEGATIVE Sensitive     * ESCHERICHIA COLI  Blood Culture ID Panel (Reflexed)     Status: Abnormal   Collection Time: 09/28/17  8:22 PM  Result Value Ref Range Status   Enterococcus species NOT DETECTED NOT DETECTED Final   Listeria monocytogenes NOT DETECTED NOT DETECTED Final   Staphylococcus species NOT DETECTED NOT DETECTED Final   Staphylococcus aureus NOT DETECTED NOT DETECTED Final   Streptococcus species NOT DETECTED NOT DETECTED Final   Streptococcus agalactiae NOT DETECTED NOT DETECTED Final   Streptococcus pneumoniae NOT DETECTED NOT DETECTED  Final   Streptococcus pyogenes NOT DETECTED NOT DETECTED Final   Acinetobacter baumannii NOT DETECTED NOT DETECTED Final   Enterobacteriaceae species DETECTED (A) NOT DETECTED Final    Comment: Enterobacteriaceae represent a large family of gram-negative bacteria, not a single organism. CRITICAL RESULT CALLED TO, READ BACK BY AND VERIFIED WITH: M. STAMEAUGH,MD 0448 09/30/2017 T. TYSOR    Enterobacter cloacae complex NOT DETECTED NOT DETECTED Final   Escherichia coli DETECTED (A) NOT DETECTED Final    Comment: CRITICAL RESULT CALLED TO, READ BACK BY AND VERIFIED WITH: M. STAMEAUGH,MD 0448 09/30/2017 T. TYSOR    Klebsiella oxytoca NOT DETECTED NOT DETECTED Final   Klebsiella pneumoniae NOT DETECTED NOT DETECTED Final   Proteus species NOT DETECTED NOT DETECTED Final   Serratia marcescens NOT DETECTED NOT DETECTED Final   Carbapenem resistance NOT DETECTED NOT DETECTED Final   Haemophilus influenzae NOT DETECTED NOT DETECTED Final   Neisseria meningitidis NOT DETECTED NOT DETECTED Final   Pseudomonas aeruginosa NOT DETECTED NOT DETECTED Final   Candida albicans NOT DETECTED NOT DETECTED Final   Candida glabrata NOT DETECTED NOT DETECTED Final   Candida krusei NOT DETECTED NOT DETECTED Final   Candida parapsilosis NOT DETECTED NOT DETECTED Final   Candida tropicalis NOT DETECTED NOT DETECTED Final    Comment: Performed at [facility], [place]. [address], [place], [place] [address]  Culture, blood (Routine x 2)     Status: None   Collection Time: 09/28/17  8:28 PM  Result Value Ref Range Status   Specimen Description BLOOD RIGHT ARM  Final   Special Requests   Final    BOTTLES DRAWN AEROBIC ONLY Blood Culture results may not be optimal due to an excessive volume of blood received in culture bottles   Culture NO GROWTH 5 DAYS  Final   Report Status 10/03/2017 FINAL  Final  Blood culture (routine x 2)     Status: None (Preliminary result)   Collection Time: 09/30/17 10:52 PM  Result  Value Ref Range Status   Specimen Description RIGHT ANTECUBITAL  Final   Special Requests   Final    BOTTLES DRAWN AEROBIC AND ANAEROBIC Blood Culture adequate volume   Culture NO GROWTH 3 DAYS  Final   Report Status PENDING  Incomplete  Blood culture (routine x 2)     Status: None (Preliminary result)  Collection Time: 09/30/17 11:30 PM  Result Value Ref Range Status   Specimen Description LEFT ANTECUBITAL  Final   Special Requests   Final    BOTTLES DRAWN AEROBIC AND ANAEROBIC Blood Culture adequate volume   Culture NO GROWTH 3 DAYS  Final   Report Status PENDING  Incomplete  Urine Culture     Status: None   Collection Time: 10/01/17  2:30 AM  Result Value Ref Range Status   Specimen Description URINE, CATHETERIZED  Final   Special Requests Normal  Final   Culture   Final    NO GROWTH Performed at [facility], [address] [place], [place] [address]    Report Status 10/03/2017 FINAL  Final     Labs: Basic Metabolic Panel: Recent Labs  Lab 09/28/17 2000 09/29/17 0651 09/30/17 2251 10/01/17 0650 10/02/17 0414  NA 137 137 139 137 137  K 3.5 3.5 4.0 3.6 3.9  CL 109 112* 113* 112* 110  CO2 18* 18* 20* 18* 20*  GLUCOSE 141* 119* 103* 111* 119*  BUN 14 16 19 16 16   CREATININE 1.00 1.01* 1.14* 1.10* 1.02*  CALCIUM 9.1 8.2* 9.2 8.6* 8.8*   Liver Function Tests: Recent Labs  Lab 09/28/17 2000 09/29/17 0651 09/30/17 2251 10/02/17 0414  AST 20 16 16 24   ALT 18 15 17 24   ALKPHOS 95 79 80 80  BILITOT 0.8 0.7 0.5 0.5  PROT 7.0 5.8* 6.4* 5.8*  ALBUMIN 3.7 2.9* 3.0* 2.6*   No results for input(s): LIPASE, AMYLASE in the last 168 hours. No results for input(s): AMMONIA in the last 168 hours. CBC: Recent Labs  Lab 09/28/17 2000 09/29/17 0651 09/30/17 2251 10/01/17 0650 10/02/17 0414  WBC 17.7* 29.2* 6.2 4.9 4.7  NEUTROABS 15.2*  --   --   --  2.5  HGB 14.1 12.7 11.8* 11.7* 12.1  HCT 43.5 40.0 37.3 37.5 38.1  MCV 94.6 95.9 97.1 95.7 95.3  PLT 230 224  179 167 199   Cardiac Enzymes: No results for input(s): CKTOTAL, CKMB, CKMBINDEX, TROPONINI in the last 168 hours. BNP: BNP (last 3 results) Recent Labs    10/01/17 0650  BNP 60.0    ProBNP (last 3 results) No results for input(s): PROBNP in the last 8760 hours.  CBG: Recent Labs  Lab 09/29/17 0048 09/29/17 0340 09/29/17 0844 09/29/17 1156 09/30/17 2229  GLUCAP 100* 126* 127* 128* 104*       Signed:  Lelon Frohlich  [facility] Pager: [phone] 10/03/2017, 3:02 PM

## 2017-10-03 NOTE — Progress Notes (Signed)
Physical Therapy Treatment Patient Details Name: Courtney Orozco MRN: [id number] DOB: 08/12/1951 Today's Date: 10/03/2017    History of Present Illness The patient is a 66 yo right handed woman recently admitted to the hospital (09/28/17 - 09/29/17) with UTI found to have bacteremia due to E coli, which grew in blood culture, who left against medical advice just yesterday, and now presents back today with continued symptoms of pain and new symptom of altered mental status.    PT Comments    Pt received in chair and was agreeable to therex in room only as she stated she didn't feel like walking this morning. Pt tolerated exercises well, but did demo fatigue with repeated sit <> stands. She needed a rest break due to leg fatigue after 4 reps and then was able to complete the set following ~30 seconds of rest. Continue to recommend OP PT upon d/c to address her impairments.    Follow Up Recommendations  Outpatient PT     Equipment Recommendations  None recommended by PT    Recommendations for Other Services       Precautions / Restrictions Precautions Precautions: Fall Precaution Comments: 2 falls in the last 6 months, they occur when she has started to drift R after walking. She states that she can tell she when she is about to drift R because she will get a funny feeling in her head. If there is a wall or someone with her, she will use either to help hold her up until she can get to a chair and rest to let the episode pass. If she does not have support, that's when she falls.  Restrictions Weight Bearing Restrictions: No    Mobility  Bed Mobility               General bed mobility comments: n/a - pt received in chair and wished to return to chair at EOS.  Transfers Overall transfer level: Needs assistance   Transfers: Sit to/from Stand Sit to Stand: Supervision         General transfer comment: sit <> stand 10x total with BUE support, required break after 4 reps;  supervision throughout due to weakness/fatigue  Ambulation/Gait             General Gait Details: n/a this date as pt only agreeable to therex in room; she did not want to walk this morning   Stairs            Wheelchair Mobility    Modified Rankin (Stroke Patients Only)       Balance Overall balance assessment: Needs assistance Sitting-balance support: Feet supported Sitting balance-Leahy Scale: Good     Standing balance support: No upper extremity supported Standing balance-Leahy Scale: Good Standing balance comment: performed sit <> stand x10, good balance noted once in full upright standing position without AD                             Cognition Arousal/Alertness: Awake/alert Behavior During Therapy: WFL for tasks assessed/performed Overall Cognitive Status: Within Functional Limits for tasks assessed                                        Exercises General Exercises - Lower Extremity Gluteal Sets: Strengthening;Both;20 reps;Seated Long Arc Quad: Strengthening;Both;20 reps;Seated Hip Flexion/Marching: Strengthening;Both;20 reps;Seated Toe Raises: Strengthening;Both;20 reps;Seated Heel Raises:  Strengthening;Both;20 reps;Seated Other Exercises Other Exercises: sit <> stand x10 reps total (rest break required after 4 reps, then completed 6 after ~30 sec rest break)    General Comments        Pertinent Vitals/Pain Pain Assessment: No/denies pain    Home Living                      Prior Function            PT Goals (current goals can now be found in the care plan section) Acute Rehab PT Goals Patient Stated Goal: to go home PT Goal Formulation: With patient Time For Goal Achievement: 10/09/17 Potential to Achieve Goals: Good    Frequency    Min 3X/week      PT Plan      Co-evaluation              AM-PAC PT "6 Clicks" Daily Activity  Outcome Measure  Difficulty turning over in bed  (including adjusting bedclothes, sheets and blankets)?: None Difficulty moving from lying on back to sitting on the side of the bed? : None Difficulty sitting down on and standing up from a chair with arms (e.g., wheelchair, bedside commode, etc,.)?: None Help needed moving to and from a bed to chair (including a wheelchair)?: A Little Help needed walking in hospital room?: A Little Help needed climbing 3-5 steps with a railing? : A Lot 6 Click Score: 20    End of Session   Activity Tolerance: Patient tolerated treatment well;No increased pain Patient left: in chair;with call bell/phone within reach Nurse Communication: Mobility status PT Visit Diagnosis: Unsteadiness on feet (R26.81);Other abnormalities of gait and mobility (R26.89);Repeated falls (R29.6);Muscle weakness (generalized) (M62.81);History of falling (Z91.81);Difficulty in walking, not elsewhere classified (R26.2)     Time: 5027-7412 PT Time Calculation (min) (ACUTE ONLY): 12 min  Charges:  $Therapeutic Exercise: 8-22 mins                    G Codes:        Geraldine Solar PT, DPT

## 2017-10-05 LAB — CULTURE, BLOOD (ROUTINE X 2)
Culture: NO GROWTH
Culture: NO GROWTH
Special Requests: ADEQUATE
Special Requests: ADEQUATE

## 2017-10-08 ENCOUNTER — Other Ambulatory Visit: Payer: Self-pay

## 2017-10-08 NOTE — Patient Outreach (Signed)
[facility]) Care Management  10/08/2017  MARLISHA VANWYK 10-24-1950 [id number]   EMMI- General Discharge  RED ON EMMI ALERT Day # 1 Date: 10/05/17 Red Alert Reason: Scheduled Follow up appointment? No  Telephone call to patient for EMMI red alert.  Spoke with patient she is able to verify HIPAA.  She states that she is doing ok but having some pain.  She reports that she thinks the urinary stone is passing.  She states she has pain medications that she can take.  Addressed red alert with her.  She reports she now has an appointment with her doctors.  Patient states she sees that urologist on Jan 23rd and PCP on Jan 9th. Advised patient that was a long wait for a follow up appointment.  Patient states she did not want to see anyone but the doctor and that was all they had for the physician.   Discussed with patient [facility] services.  Patient declines services at this time.    Plan: RN CM will close case and notify care management assistant of case status.  Jone Baseman, RN, MSN [facility] Care Management Care Management Coordinator Direct Line [phone] Toll Free: [phone]  Fax: [phone]

## 2017-10-17 ENCOUNTER — Telehealth (HOSPITAL_COMMUNITY): Payer: Self-pay | Admitting: Internal Medicine

## 2017-10-17 NOTE — Telephone Encounter (Signed)
10/17/17  Spoke to patient about scheduling and she said she really didn't know what her schedule was like right now but for [place] to call her back.

## 2017-11-21 ENCOUNTER — Ambulatory Visit: Payer: Medicare Other | Admitting: Urology

## 2017-11-21 DIAGNOSIS — N201 Calculus of ureter: Secondary | ICD-10-CM

## 2017-11-23 ENCOUNTER — Other Ambulatory Visit: Payer: Self-pay | Admitting: Urology

## 2017-11-23 NOTE — Patient Instructions (Signed)
Courtney Orozco  11/23/2017     @PREFPERIOPPHARMACY @   Your procedure is scheduled on  11/28/2017 .  Report to [name] at  1135   A.M.  Call this number if you have problems the morning of surgery:  [phone]   Remember:  Do not eat food or drink liquids after midnight.  Take these medicines the morning of surgery with A SIP OF WATER  Wellbutrin, flexaril or valium, hydrocodone, levothyroxine.   Do not wear jewelry, make-up or nail polish.  Do not wear lotions, powders, or perfumes, or deodorant.  Do not shave 48 hours prior to surgery.  Men may shave face and neck.  Do not bring valuables to the hospital.  [facility] is not responsible for any belongings or valuables.  Contacts, dentures or bridgework may not be worn into surgery.  Leave your suitcase in the car.  After surgery it may be brought to your room.  For patients admitted to the hospital, discharge time will be determined by your treatment team.  Patients discharged the day of surgery will not be allowed to drive home.   Name and phone number of your driver:   family Special instructions:  None  Please read over the following fact sheets that you were given. Anesthesia Post-op Instructions and Care and Recovery After Surgery       Ureteroscopy Ureteroscopy is a procedure to check for and treat problems inside part of the urinary tract. In this procedure, a thin, tube-shaped instrument with a light at the end (ureteroscope) is used to look at the inside of the kidneys and the ureters, which are the tubes that carry urine from the kidneys to the bladder. The ureteroscope is inserted into one or both of the ureters. You may need this procedure if you have frequent urinary tract infections (UTIs), blood in your urine, or a stone in one of your ureters. A ureteroscopy can be done to find the cause of urine blockage in a ureter and to evaluate other abnormalities inside the ureters or kidneys. If  stones are found, they can be removed during the procedure. Polyps, abnormal tissue, and some types of tumors can also be removed or treated. The ureteroscope may also have a tool to remove tissue to be checked for disease under a microscope (biopsy). Tell a health care provider about:  Any allergies you have.  All medicines you are taking, including vitamins, herbs, eye drops, creams, and over-the-counter medicines.  Any problems you or family members have had with anesthetic medicines.  Any blood disorders you have.  Any surgeries you have had.  Any medical conditions you have.  Whether you are pregnant or may be pregnant. What are the risks? Generally, this is a safe procedure. However, problems may occur, including:  Bleeding.  Infection.  Allergic reactions to medicines.  Scarring that narrows the ureter (stricture).  Creating a hole in the ureter (perforation).  What happens before the procedure? Staying hydrated Follow instructions from your health care provider about hydration, which may include:  Up to 2 hours before the procedure - you may continue to drink clear liquids, such as water, clear fruit juice, black coffee, and plain tea.  Eating and drinking restrictions Follow instructions from your health care provider about eating and drinking, which may include:  8 hours before the procedure - stop eating heavy meals or foods such as meat, fried foods, or fatty foods.  6 hours before the procedure - stop eating light meals or foods, such as toast or cereal.  6 hours before the procedure - stop drinking milk or drinks that contain milk.  2 hours before the procedure - stop drinking clear liquids.  Medicines  Ask your health care provider about: ? Changing or stopping your regular medicines. This is especially important if you are taking diabetes medicines or blood thinners. ? Taking medicines such as aspirin and ibuprofen. These medicines can thin your  blood. Do not take these medicines before your procedure if your health care provider instructs you not to.  You may be given antibiotic medicine to help prevent infection. General instructions  You may have a urine sample taken to check for infection.  Plan to have someone take you home from the hospital or clinic. What happens during the procedure?  To reduce your risk of infection: ? Your health care team will wash or sanitize their hands. ? Your skin will be washed with soap.  An IV tube will be inserted into one of your veins.  You will be given one of the following: ? A medicine to help you relax (sedative). ? A medicine to make you fall asleep (general anesthetic). ? A medicine that is injected into your spine to numb the area below and slightly above the injection site (spinal anesthetic).  To lower your risk of infection, you may be given an antibiotic medicine by an injection or through the IV tube.  The opening from which you urinate (urethra) will be cleaned with a germ-killing solution.  The ureteroscope will be passed through your urethra into your bladder.  A salt-water solution will flow through the ureteroscope to fill your bladder. This will help the health care provider see the openings of your ureters more clearly.  Then, the ureteroscope will be passed into your ureter. ? If a growth is found, a piece of it may be removed so it can be examined under a microscope (biopsy). ? If a stone is found, it may be removed through the ureteroscope, or the stone may be broken up using a laser, shock waves, or electrical energy. ? In some cases, if the ureter is too small, a tube may be inserted that keeps the ureter open (ureteral stent). The stent may be left in place for 1 or 2 weeks to keep the ureter open, and then the ureteroscopy procedure will be performed.  The scope will be removed, and your bladder will be emptied. The procedure may vary among health care  providers and hospitals. What happens after the procedure?  Your blood pressure, heart rate, breathing rate, and blood oxygen level will be monitored until the medicines you were given have worn off.  You may be asked to urinate.  Donot drive for 24 hours if you were given a sedative. This information is not intended to replace advice given to you by your health care provider. Make sure you discuss any questions you have with your health care provider. Document Released: 10/14/2013 Document Revised: 07/25/2016 Document Reviewed: 07/21/2016 Elsevier Interactive Patient Education  [employer].  Cystoscopy Cystoscopy is a procedure that is used to help diagnose and sometimes treat conditions that affect that lower urinary tract. The lower urinary tract includes the bladder and the tube that drains urine from the bladder out of the body (urethra). Cystoscopy is performed with a thin, tube-shaped instrument with a light and camera at the end (cystoscope). The cystoscope may be hard (  rigid) or flexible, depending on the goal of the procedure.The cystoscope is inserted through the urethra, into the bladder. Cystoscopy may be recommended if you have:  Urinary tractinfections that keep coming back (recurring).  Blood in the urine (hematuria).  Loss of bladder control (urinary incontinence) or an overactive bladder.  Unusual cells found in a urine sample.  A blockage in the urethra.  Painful urination.  An abnormality in the bladder found during an intravenous pyelogram (IVP) or CT scan.  Cystoscopy may also be done to remove a sample of tissue to be examined under a microscope (biopsy). Tell a health care provider about:  Any allergies you have.  All medicines you are taking, including vitamins, herbs, eye drops, creams, and over-the-counter medicines.  Any problems you or family members have had with anesthetic medicines.  Any blood disorders you have.  Any surgeries you  have had.  Any medical conditions you have.  Whether you are pregnant or may be pregnant. What are the risks? Generally, this is a safe procedure. However, problems may occur, including:  Infection.  Bleeding.  Allergic reactions to medicines.  Damage to other structures or organs.  What happens before the procedure?  Ask your health care provider about: ? Changing or stopping your regular medicines. This is especially important if you are taking diabetes medicines or blood thinners. ? Taking medicines such as aspirin and ibuprofen. These medicines can thin your blood. Do not take these medicines before your procedure if your health care provider instructs you not to.  Follow instructions from your health care provider about eating or drinking restrictions.  You may be given antibiotic medicine to help prevent infection.  You may have an exam or testing, such as X-rays of the bladder, urethra, or kidneys.  You may have urine tests to check for signs of infection.  Plan to have someone take you home after the procedure. What happens during the procedure?  To reduce your risk of infection,your health care team will wash or sanitize their hands.  You will be given one or more of the following: ? A medicine to help you relax (sedative). ? A medicine to numb the area (local anesthetic).  The area around the opening of your urethra will be cleaned.  The cystoscope will be passed through your urethra into your bladder.  Germ-free (sterile)fluid will flow through the cystoscope to fill your bladder. The fluid will stretch your bladder so that your surgeon can clearly examine your bladder walls.  The cystoscope will be removed and your bladder will be emptied. The procedure may vary among health care providers and hospitals. What happens after the procedure?  You may have some soreness or pain in your abdomen and urethra. Medicines will be available to help you.  You may  have some blood in your urine.  Do not drive for 24 hours if you received a sedative. This information is not intended to replace advice given to you by your health care provider. Make sure you discuss any questions you have with your health care provider. Document Released: 10/06/2000 Document Revised: 02/17/2016 Document Reviewed: 08/26/2015 Elsevier Interactive Patient Education  2018 [employer].  Cystoscopy, Care After Refer to this sheet in the next few weeks. These instructions provide you with information about caring for yourself after your procedure. Your health care provider may also give you more specific instructions. Your treatment has been planned according to current medical practices, but problems sometimes occur. Call your health care provider if  you have any problems or questions after your procedure. What can I expect after the procedure? After the procedure, it is common to have:  Mild pain when you urinate. Pain should stop within a few minutes after you urinate. This may last for up to 1 week.  A small amount of blood in your urine for several days.  Feeling like you need to urinate but producing only a small amount of urine.  Follow these instructions at home:  Medicines  Take over-the-counter and prescription medicines only as told by your health care provider.  If you were prescribed an antibiotic medicine, take it as told by your health care provider. Do not stop taking the antibiotic even if you start to feel better. General instructions   Return to your normal activities as told by your health care provider. Ask your health care provider what activities are safe for you.  Do not drive for 24 hours if you received a sedative.  Watch for any blood in your urine. If the amount of blood in your urine increases, call your health care provider.  Follow instructions from your health care provider about eating or drinking restrictions.  If a tissue sample  was removed for testing (biopsy) during your procedure, it is your responsibility to get your test results. Ask your health care provider or the department performing the test when your results will be ready.  Drink enough fluid to keep your urine clear or pale yellow.  Keep all follow-up visits as told by your health care provider. This is important. Contact a health care provider if:  You have pain that gets worse or does not get better with medicine, especially pain when you urinate.  You have difficulty urinating. Get help right away if:  You have more blood in your urine.  You have blood clots in your urine.  You have abdominal pain.  You have a fever or chills.  You are unable to urinate. This information is not intended to replace advice given to you by your health care provider. Make sure you discuss any questions you have with your health care provider. Document Released: 04/28/2005 Document Revised: 03/16/2016 Document Reviewed: 08/26/2015 Elsevier Interactive Patient Education  2018 [place] Anesthesia, Adult General anesthesia is the use of medicines to make a person "go to sleep" (be unconscious) for a medical procedure. General anesthesia is often recommended when a procedure:  Is long.  Requires you to be still or in an unusual position.  Is major and can cause you to lose blood.  Is impossible to do without general anesthesia.  The medicines used for general anesthesia are called general anesthetics. In addition to making you sleep, the medicines:  Prevent pain.  Control your blood pressure.  Relax your muscles.  Tell a health care provider about:  Any allergies you have.  All medicines you are taking, including vitamins, herbs, eye drops, creams, and over-the-counter medicines.  Any problems you or family members have had with anesthetic medicines.  Types of anesthetics you have had in the past.  Any bleeding disorders you  have.  Any surgeries you have had.  Any medical conditions you have.  Any history of heart or lung conditions, such as heart failure, sleep apnea, or chronic obstructive pulmonary disease (COPD).  Whether you are pregnant or may be pregnant.  Whether you use tobacco, alcohol, marijuana, or street drugs.  Any history of Armed forces logistics/support/administrative officer.  Any history of depression or anxiety. What are  the risks? Generally, this is a safe procedure. However, problems may occur, including:  Allergic reaction to anesthetics.  Lung and heart problems.  Inhaling food or liquids from your stomach into your lungs (aspiration).  Injury to nerves.  Waking up during your procedure and being unable to move (rare).  Extreme agitation or a state of mental confusion (delirium) when you wake up from the anesthetic.  Air in the bloodstream, which can lead to stroke.  These problems are more likely to develop if you are having a major surgery or if you have an advanced medical condition. You can prevent some of these complications by answering all of your health care provider's questions thoroughly and by following all pre-procedure instructions. General anesthesia can cause side effects, including:  Nausea or vomiting  A sore throat from the breathing tube.  Feeling cold or shivery.  Feeling tired, washed out, or achy.  Sleepiness or drowsiness.  Confusion or agitation.  What happens before the procedure? Staying hydrated Follow instructions from your health care provider about hydration, which may include:  Up to 2 hours before the procedure - you may continue to drink clear liquids, such as water, clear fruit juice, black coffee, and plain tea.  Eating and drinking restrictions Follow instructions from your health care provider about eating and drinking, which may include:  8 hours before the procedure - stop eating heavy meals or foods such as meat, fried foods, or fatty foods.  6 hours  before the procedure - stop eating light meals or foods, such as toast or cereal.  6 hours before the procedure - stop drinking milk or drinks that contain milk.  2 hours before the procedure - stop drinking clear liquids.  Medicines  Ask your health care provider about: ? Changing or stopping your regular medicines. This is especially important if you are taking diabetes medicines or blood thinners. ? Taking medicines such as aspirin and ibuprofen. These medicines can thin your blood. Do not take these medicines before your procedure if your health care provider instructs you not to. ? Taking new dietary supplements or medicines. Do not take these during the week before your procedure unless your health care provider approves them.  If you are told to take a medicine or to continue taking a medicine on the day of the procedure, take the medicine with sips of water. General instructions   Ask if you will be going home the same day, the following day, or after a longer hospital stay. ? Plan to have someone take you home. ? Plan to have someone stay with you for the first 24 hours after you leave the hospital or clinic.  For 3-6 weeks before the procedure, try not to use any tobacco products, such as cigarettes, chewing tobacco, and e-cigarettes.  You may brush your teeth on the morning of the procedure, but make sure to spit out the toothpaste. What happens during the procedure?  You will be given anesthetics through a mask and through an IV tube in one of your veins.  You may receive medicine to help you relax (sedative).  As soon as you are asleep, a breathing tube may be used to help you breathe.  An anesthesia specialist will stay with you throughout the procedure. He or she will help keep you comfortable and safe by continuing to give you medicines and adjusting the amount of medicine that you get. He or she will also watch your blood pressure, pulse, and oxygen levels to  make  sure that the anesthetics do not cause any problems.  If a breathing tube was used to help you breathe, it will be removed before you wake up. The procedure may vary among health care providers and hospitals. What happens after the procedure?  You will wake up, often slowly, after the procedure is complete, usually in a recovery area.  Your blood pressure, heart rate, breathing rate, and blood oxygen level will be monitored until the medicines you were given have worn off.  You may be given medicine to help you calm down if you feel anxious or agitated.  If you will be going home the same day, your health care provider may check to make sure you can stand, drink, and urinate.  Your health care providers will treat your pain and side effects before you go home.  Do not drive for 24 hours if you received a sedative.  You may: ? Feel nauseous and vomit. ? Have a sore throat. ? Have mental slowness. ? Feel cold or shivery. ? Feel sleepy. ? Feel tired. ? Feel sore or achy, even in parts of your body where you did not have surgery. This information is not intended to replace advice given to you by your health care provider. Make sure you discuss any questions you have with your health care provider. Document Released: 01/16/2008 Document Revised: 03/21/2016 Document Reviewed: 09/23/2015 Elsevier Interactive Patient Education  2018 [place] Anesthesia, Adult, Care After These instructions provide you with information about caring for yourself after your procedure. Your health care provider may also give you more specific instructions. Your treatment has been planned according to current medical practices, but problems sometimes occur. Call your health care provider if you have any problems or questions after your procedure. What can I expect after the procedure? After the procedure, it is common to have:  Vomiting.  A sore throat.  Mental slowness.  It is common to  feel:  Nauseous.  Cold or shivery.  Sleepy.  Tired.  Sore or achy, even in parts of your body where you did not have surgery.  Follow these instructions at home: For at least 24 hours after the procedure:  Do not: ? Participate in activities where you could fall or become injured. ? Drive. ? Use heavy machinery. ? Drink alcohol. ? Take sleeping pills or medicines that cause drowsiness. ? Make important decisions or sign legal documents. ? Take care of children on your own.  Rest. Eating and drinking  If you vomit, drink water, juice, or soup when you can drink without vomiting.  Drink enough fluid to keep your urine clear or pale yellow.  Make sure you have little or no nausea before eating solid foods.  Follow the diet recommended by your health care provider. General instructions  Have a responsible adult stay with you until you are awake and alert.  Return to your normal activities as told by your health care provider. Ask your health care provider what activities are safe for you.  Take over-the-counter and prescription medicines only as told by your health care provider.  If you smoke, do not smoke without supervision.  Keep all follow-up visits as told by your health care provider. This is important. Contact a health care provider if:  You continue to have nausea or vomiting at home, and medicines are not helpful.  You cannot drink fluids or start eating again.  You cannot urinate after 8-12 hours.  You develop a skin rash.  You have fever.  You have increasing redness at the site of your procedure. Get help right away if:  You have difficulty breathing.  You have chest pain.  You have unexpected bleeding.  You feel that you are having a life-threatening or urgent problem. This information is not intended to replace advice given to you by your health care provider. Make sure you discuss any questions you have with your health care  provider. Document Released: 01/15/2001 Document Revised: 03/13/2016 Document Reviewed: 09/23/2015 Elsevier Interactive Patient Education  [employer].

## 2017-11-27 ENCOUNTER — Encounter (HOSPITAL_COMMUNITY): Payer: Self-pay

## 2017-11-27 ENCOUNTER — Encounter (HOSPITAL_COMMUNITY)
Admission: RE | Admit: 2017-11-27 | Discharge: 2017-11-27 | Disposition: A | Discharge status: Home / Self Care | Payer: Medicare Other | Source: Ambulatory Visit | Attending: Urology | Admitting: Urology

## 2017-11-27 ENCOUNTER — Other Ambulatory Visit: Payer: Self-pay

## 2017-11-27 DIAGNOSIS — Z87442 Personal history of urinary calculi: Secondary | ICD-10-CM | POA: Diagnosis not present

## 2017-11-27 DIAGNOSIS — I10 Essential (primary) hypertension: Secondary | ICD-10-CM | POA: Diagnosis not present

## 2017-11-27 DIAGNOSIS — M1990 Unspecified osteoarthritis, unspecified site: Secondary | ICD-10-CM | POA: Diagnosis not present

## 2017-11-27 DIAGNOSIS — E119 Type 2 diabetes mellitus without complications: Secondary | ICD-10-CM | POA: Diagnosis not present

## 2017-11-27 DIAGNOSIS — Z466 Encounter for fitting and adjustment of urinary device: Secondary | ICD-10-CM | POA: Diagnosis not present

## 2017-11-27 DIAGNOSIS — F17210 Nicotine dependence, cigarettes, uncomplicated: Secondary | ICD-10-CM | POA: Diagnosis not present

## 2017-11-27 DIAGNOSIS — F329 Major depressive disorder, single episode, unspecified: Secondary | ICD-10-CM | POA: Diagnosis not present

## 2017-11-27 DIAGNOSIS — Z8619 Personal history of other infectious and parasitic diseases: Secondary | ICD-10-CM | POA: Diagnosis not present

## 2017-11-27 DIAGNOSIS — E039 Hypothyroidism, unspecified: Secondary | ICD-10-CM | POA: Diagnosis not present

## 2017-11-27 DIAGNOSIS — F419 Anxiety disorder, unspecified: Secondary | ICD-10-CM | POA: Diagnosis not present

## 2017-11-27 DIAGNOSIS — Z881 Allergy status to other antibiotic agents status: Secondary | ICD-10-CM | POA: Diagnosis not present

## 2017-11-27 DIAGNOSIS — Z7984 Long term (current) use of oral hypoglycemic drugs: Secondary | ICD-10-CM | POA: Diagnosis not present

## 2017-11-27 DIAGNOSIS — Z882 Allergy status to sulfonamides status: Secondary | ICD-10-CM | POA: Diagnosis not present

## 2017-11-27 DIAGNOSIS — Z79899 Other long term (current) drug therapy: Secondary | ICD-10-CM | POA: Diagnosis not present

## 2017-11-27 DIAGNOSIS — K219 Gastro-esophageal reflux disease without esophagitis: Secondary | ICD-10-CM | POA: Diagnosis not present

## 2017-11-27 DIAGNOSIS — N201 Calculus of ureter: Secondary | ICD-10-CM | POA: Diagnosis present

## 2017-11-27 HISTORY — DX: Personal history of urinary calculi: Z87.442

## 2017-11-27 LAB — HEMOGLOBIN A1C
Hgb A1c MFr Bld: 5.9 % — ABNORMAL HIGH (ref 4.8–5.6)
Mean Plasma Glucose: 122.63 mg/dL

## 2017-11-27 LAB — GLUCOSE, CAPILLARY: Glucose-Capillary: 94 mg/dL (ref 65–99)

## 2017-11-28 ENCOUNTER — Ambulatory Visit (HOSPITAL_COMMUNITY): Payer: Medicare Other | Admitting: Anesthesiology

## 2017-11-28 ENCOUNTER — Ambulatory Visit (HOSPITAL_COMMUNITY): Payer: Medicare Other

## 2017-11-28 ENCOUNTER — Encounter (HOSPITAL_COMMUNITY)
Admission: RE | Disposition: A | Discharge status: Home / Self Care | Payer: Self-pay | Source: Ambulatory Visit | Attending: Urology

## 2017-11-28 ENCOUNTER — Encounter (HOSPITAL_COMMUNITY): Payer: Self-pay

## 2017-11-28 ENCOUNTER — Ambulatory Visit (HOSPITAL_COMMUNITY)
Admission: RE | Admit: 2017-11-28 | Discharge: 2017-11-28 | Disposition: A | Discharge status: Home / Self Care | Payer: Medicare Other | Source: Ambulatory Visit | Attending: Urology | Admitting: Urology

## 2017-11-28 ENCOUNTER — Other Ambulatory Visit: Payer: Self-pay

## 2017-11-28 DIAGNOSIS — F329 Major depressive disorder, single episode, unspecified: Secondary | ICD-10-CM | POA: Insufficient documentation

## 2017-11-28 DIAGNOSIS — Z881 Allergy status to other antibiotic agents status: Secondary | ICD-10-CM | POA: Insufficient documentation

## 2017-11-28 DIAGNOSIS — N201 Calculus of ureter: Secondary | ICD-10-CM | POA: Insufficient documentation

## 2017-11-28 DIAGNOSIS — I10 Essential (primary) hypertension: Secondary | ICD-10-CM | POA: Insufficient documentation

## 2017-11-28 DIAGNOSIS — K219 Gastro-esophageal reflux disease without esophagitis: Secondary | ICD-10-CM | POA: Diagnosis not present

## 2017-11-28 DIAGNOSIS — Z466 Encounter for fitting and adjustment of urinary device: Secondary | ICD-10-CM | POA: Diagnosis not present

## 2017-11-28 DIAGNOSIS — Z87442 Personal history of urinary calculi: Secondary | ICD-10-CM | POA: Insufficient documentation

## 2017-11-28 DIAGNOSIS — E039 Hypothyroidism, unspecified: Secondary | ICD-10-CM | POA: Insufficient documentation

## 2017-11-28 DIAGNOSIS — Z7984 Long term (current) use of oral hypoglycemic drugs: Secondary | ICD-10-CM | POA: Insufficient documentation

## 2017-11-28 DIAGNOSIS — Z882 Allergy status to sulfonamides status: Secondary | ICD-10-CM | POA: Insufficient documentation

## 2017-11-28 DIAGNOSIS — F17210 Nicotine dependence, cigarettes, uncomplicated: Secondary | ICD-10-CM | POA: Insufficient documentation

## 2017-11-28 DIAGNOSIS — E119 Type 2 diabetes mellitus without complications: Secondary | ICD-10-CM | POA: Diagnosis not present

## 2017-11-28 DIAGNOSIS — M1990 Unspecified osteoarthritis, unspecified site: Secondary | ICD-10-CM | POA: Insufficient documentation

## 2017-11-28 DIAGNOSIS — F419 Anxiety disorder, unspecified: Secondary | ICD-10-CM | POA: Insufficient documentation

## 2017-11-28 DIAGNOSIS — Z79899 Other long term (current) drug therapy: Secondary | ICD-10-CM | POA: Insufficient documentation

## 2017-11-28 DIAGNOSIS — Z8619 Personal history of other infectious and parasitic diseases: Secondary | ICD-10-CM | POA: Insufficient documentation

## 2017-11-28 HISTORY — PX: CYSTOSCOPY/RETROGRADE/URETEROSCOPY: SHX5316

## 2017-11-28 LAB — GLUCOSE, CAPILLARY
GLUCOSE-CAPILLARY: 83 mg/dL (ref 65–99)
GLUCOSE-CAPILLARY: 85 mg/dL (ref 65–99)

## 2017-11-28 SURGERY — CYSTOSCOPY/RETROGRADE/URETEROSCOPY
Anesthesia: General | Laterality: Right

## 2017-11-28 MED ORDER — DEXAMETHASONE SODIUM PHOSPHATE 4 MG/ML IJ SOLN
INTRAMUSCULAR | Status: AC
  Filled 2017-11-28: qty 1

## 2017-11-28 MED ORDER — FENTANYL CITRATE (PF) 100 MCG/2ML IJ SOLN
INTRAMUSCULAR | Status: DC | PRN
  Administered 2017-11-28 (×2): 25 ug via INTRAVENOUS

## 2017-11-28 MED ORDER — LACTATED RINGERS IV SOLN
INTRAVENOUS | Status: DC
  Administered 2017-11-28: 13:00:00 via INTRAVENOUS

## 2017-11-28 MED ORDER — MIDAZOLAM HCL 2 MG/2ML IJ SOLN
INTRAMUSCULAR | Status: AC
  Filled 2017-11-28: qty 2

## 2017-11-28 MED ORDER — SUCCINYLCHOLINE CHLORIDE 20 MG/ML IJ SOLN
INTRAMUSCULAR | Status: AC
  Filled 2017-11-28: qty 2

## 2017-11-28 MED ORDER — ONDANSETRON HCL 4 MG/2ML IJ SOLN
4.0000 mg | Freq: Once | INTRAMUSCULAR | Status: AC
  Administered 2017-11-28: 4 mg via INTRAVENOUS
  Filled 2017-11-28: qty 2

## 2017-11-28 MED ORDER — PROPOFOL 10 MG/ML IV BOLUS
INTRAVENOUS | Status: AC
Start: 2017-11-28 — End: 2017-11-28
  Filled 2017-11-28: qty 20

## 2017-11-28 MED ORDER — MIDAZOLAM HCL 5 MG/5ML IJ SOLN
INTRAMUSCULAR | Status: DC | PRN
  Administered 2017-11-28 (×2): 0.5 mg via INTRAVENOUS

## 2017-11-28 MED ORDER — EPHEDRINE SULFATE 50 MG/ML IJ SOLN
INTRAMUSCULAR | Status: AC
  Filled 2017-11-28: qty 1

## 2017-11-28 MED ORDER — DIATRIZOATE MEGLUMINE 30 % UR SOLN
URETHRAL | Status: AC
  Filled 2017-11-28: qty 100

## 2017-11-28 MED ORDER — FENTANYL CITRATE (PF) 100 MCG/2ML IJ SOLN: 25.0000 ug | INTRAMUSCULAR | Status: DC | PRN

## 2017-11-28 MED ORDER — SODIUM CHLORIDE 0.9 % IJ SOLN
INTRAMUSCULAR | Status: AC
  Filled 2017-11-28: qty 10

## 2017-11-28 MED ORDER — MIDAZOLAM HCL 2 MG/2ML IJ SOLN
1.0000 mg | INTRAMUSCULAR | Status: AC
  Administered 2017-11-28: 2 mg via INTRAVENOUS
  Filled 2017-11-28: qty 2

## 2017-11-28 MED ORDER — PROPOFOL 10 MG/ML IV BOLUS
INTRAVENOUS | Status: DC | PRN
  Administered 2017-11-28: 110 mg via INTRAVENOUS

## 2017-11-28 MED ORDER — GLYCOPYRROLATE 0.2 MG/ML IJ SOLN
0.2000 mg | Freq: Once | INTRAMUSCULAR | Status: AC
  Administered 2017-11-28: 0.2 mg via INTRAVENOUS
  Filled 2017-11-28: qty 1

## 2017-11-28 MED ORDER — DIATRIZOATE MEGLUMINE 30 % UR SOLN
URETHRAL | Status: DC | PRN
  Administered 2017-11-28: 10 mL via URETHRAL

## 2017-11-28 MED ORDER — ETOMIDATE 2 MG/ML IV SOLN
INTRAVENOUS | Status: AC
  Filled 2017-11-28: qty 10

## 2017-11-28 MED ORDER — DEXTROSE 50 % IV SOLN
12.5000 g | Freq: Once | INTRAVENOUS | Status: AC
  Administered 2017-11-28: 12.5 g via INTRAVENOUS

## 2017-11-28 MED ORDER — HYDROCODONE-ACETAMINOPHEN 5-325 MG PO TABS: 1.0000 | ORAL_TABLET | ORAL | 0 refills | Status: DC | PRN

## 2017-11-28 MED ORDER — DEXTROSE 5 % IV SOLN
2.0000 g | INTRAVENOUS | Status: AC
  Administered 2017-11-28: 2 g via INTRAVENOUS
  Filled 2017-11-28: qty 2

## 2017-11-28 MED ORDER — DEXTROSE 50 % IV SOLN
INTRAVENOUS | Status: AC
  Filled 2017-11-28: qty 50

## 2017-11-28 MED ORDER — FENTANYL CITRATE (PF) 100 MCG/2ML IJ SOLN
INTRAMUSCULAR | Status: AC
Start: 2017-11-28 — End: 2017-11-28
  Filled 2017-11-28: qty 2

## 2017-11-28 MED ORDER — LIDOCAINE HCL (PF) 1 % IJ SOLN
INTRAMUSCULAR | Status: AC
  Filled 2017-11-28: qty 10

## 2017-11-28 MED ORDER — STERILE WATER FOR IRRIGATION IR SOLN
Status: DC | PRN
  Administered 2017-11-28: 1000 mL

## 2017-11-28 MED ORDER — SODIUM CHLORIDE 0.9 % IR SOLN
Status: DC | PRN
  Administered 2017-11-28: 3000 mL via INTRAVESICAL

## 2017-11-28 SURGICAL SUPPLY — 22 items
BAG DRAIN URO TABLE W/ADPT NS (DRAPE) ×3 IMPLANT
BAG DRN 8 ADPR NS SKTRN CSTL (DRAPE) ×1
BAG HAMPER (MISCELLANEOUS) ×3 IMPLANT
CATH INTERMIT  6FR 70CM (CATHETERS) ×3 IMPLANT
CLOTH BEACON ORANGE TIMEOUT ST (SAFETY) ×3 IMPLANT
EXTRACTOR STONE NITINOL NGAGE (UROLOGICAL SUPPLIES) ×2 IMPLANT
GLOVE BIO SURGEON STRL SZ8 (GLOVE) ×3 IMPLANT
GLOVE BIOGEL PI IND STRL 7.0 (GLOVE) IMPLANT
GLOVE BIOGEL PI INDICATOR 7.0 (GLOVE) ×4
GLOVE ECLIPSE 7.0 STRL STRAW (GLOVE) ×2 IMPLANT
GOWN STRL REUS W/ TWL LRG LVL3 (GOWN DISPOSABLE) ×1 IMPLANT
GOWN STRL REUS W/ TWL XL LVL3 (GOWN DISPOSABLE) ×1 IMPLANT
GOWN STRL REUS W/TWL LRG LVL3 (GOWN DISPOSABLE) ×3
GOWN STRL REUS W/TWL XL LVL3 (GOWN DISPOSABLE) ×3
GUIDEWIRE STR DUAL SENSOR (WIRE) ×3 IMPLANT
GUIDEWIRE STR ZIPWIRE 035X150 (MISCELLANEOUS) ×3 IMPLANT
IV NS IRRIG 3000ML ARTHROMATIC (IV SOLUTION) ×6 IMPLANT
KIT ROOM TURNOVER AP CYSTO (KITS) ×3 IMPLANT
MANIFOLD NEPTUNE II (INSTRUMENTS) ×3 IMPLANT
PACK CYSTO (CUSTOM PROCEDURE TRAY) ×3 IMPLANT
PAD ARMBOARD 7.5X6 YLW CONV (MISCELLANEOUS) ×3 IMPLANT
WATER STERILE IRR 500ML POUR (IV SOLUTION) ×3 IMPLANT

## 2017-11-28 NOTE — Anesthesia Postprocedure Evaluation (Signed)
Anesthesia Post Note  Patient: MAXINE FREDMAN  Procedure(s) Performed: CYSTOSCOPY,  REMOVAL OF RIGHT URETERAL STENT, RIGHT RETROGRADE PYELOGRAM, RIGHT URETEROSCOPY, STONE BASKET EXTRACTION RIGHT URETERAL CALCULUS (Right )  Patient location during evaluation: PACU Anesthesia Type: General Level of consciousness: awake and alert and patient cooperative Pain management: pain level controlled Vital Signs Assessment: post-procedure vital signs reviewed and stable Respiratory status: spontaneous breathing, nonlabored ventilation and respiratory function stable Cardiovascular status: blood pressure returned to baseline Postop Assessment: no apparent nausea or vomiting Anesthetic complications: no     Last Vitals:  Vitals:   11/28/17 1430 11/28/17 1432  BP: 127/80 (!) 142/61  Pulse: 88 85  Resp: 16 16  Temp:  36.6 C  SpO2: 93% 94%    Last Pain:  Vitals:   11/28/17 1432  TempSrc: Oral  PainSc: 0-No pain                 Mayanna Garlitz J

## 2017-11-28 NOTE — Anesthesia Preprocedure Evaluation (Addendum)
Anesthesia Evaluation  Patient identified by MRN, date of birth, ID band Patient awake    Reviewed: Allergy & Precautions, H&P , NPO status , Patient's Chart, lab work & pertinent test results  History of Anesthesia Complications (+) PONV and history of anesthetic complications ( very drowsy after last cysto requiring overnight admission)  Airway Mallampati: II  TM Distance: >3 FB Neck ROM: Full    Dental no notable dental hx. (+) Edentulous Upper, Edentulous Lower, Dental Advisory Given   Pulmonary neg pulmonary ROS, shortness of breath, Current Smoker, former smoker,    Pulmonary exam normal breath sounds clear to auscultation       Cardiovascular hypertension, Pt. on medications  Rhythm:Regular Rate:Normal     Neuro/Psych  Headaches, PSYCHIATRIC DISORDERS Anxiety Depression    GI/Hepatic Neg liver ROS, GERD  Medicated and Controlled,(+) Hepatitis -  Endo/Other  diabetes, Type 2, Oral Hypoglycemic AgentsHypothyroidism   Renal/GU Renal diseasenegative Renal ROS  negative genitourinary   Musculoskeletal  (+) Arthritis , Osteoarthritis,    Abdominal   Peds  Hematology negative hematology ROS (+)   Anesthesia Other Findings   Reproductive/Obstetrics negative OB ROS                            Anesthesia Physical Anesthesia Plan  ASA: III  Anesthesia Plan: General   Post-op Pain Management:    Induction: Intravenous  PONV Risk Score and Plan:   Airway Management Planned: LMA  Additional Equipment:   Intra-op Plan:   Post-operative Plan: Extubation in OR  Informed Consent: I have reviewed the patients History and Physical, chart, labs and discussed the procedure including the risks, benefits and alternatives for the proposed anesthesia with the patient or authorized representative who has indicated his/her understanding and acceptance.     Plan Discussed with:   Anesthesia  Plan Comments:         Anesthesia Quick Evaluation

## 2017-11-28 NOTE — Discharge Instructions (Signed)
PATIENT INSTRUCTIONS POST-ANESTHESIA  IMMEDIATELY FOLLOWING SURGERY:  Do not drive or operate machinery for the first twenty four hours after surgery.  Do not make any important decisions for twenty four hours after surgery or while taking narcotic pain medications or sedatives.  If you develop intractable nausea and vomiting or a severe headache please notify your doctor immediately.  FOLLOW-UP:  Please make an appointment with your surgeon as instructed. You do not need to follow up with anesthesia unless specifically instructed to do so.  WOUND CARE INSTRUCTIONS (if applicable):  Keep a dry clean dressing on the anesthesia/puncture wound site if there is drainage.  Once the wound has quit draining you may leave it open to air.  Generally you should leave the bandage intact for twenty four hours unless there is drainage.  If the epidural site drains for more than 36-48 hours please call the anesthesia department.  QUESTIONS?:  Please feel free to call your physician or the hospital operator if you have any questions, and they will be happy to assist you.       Ureteroscopy, Care After This sheet gives you information about how to care for yourself after your procedure. Your health care provider may also give you more specific instructions. If you have problems or questions, contact your health care provider. What can I expect after the procedure? After the procedure, it is common to have:  A burning sensation when you urinate.  Blood in your urine.  Mild discomfort in the bladder area or kidney area when urinating.  Needing to urinate more often or urgently.  Follow these instructions at home: Medicines  Take over-the-counter and prescription medicines only as told by your health care provider.  If you were prescribed an antibiotic medicine, take it as told by your health care provider. Do not stop taking the antibiotic even if you start to feel better. General  instructions   Donot drive for 24 hours if you were given a medicine to help you relax (sedative) during your procedure.  To relieve burning, try taking a warm bath or holding a warm washcloth over your groin.  Drink enough fluid to keep your urine clear or pale yellow. ? Drink two 8-ounce glasses of water every hour for the first 2 hours after you get home. ? Continue to drink water often at home.  You can eat what you usually do.  Keep all follow-up visits as told by your health care provider. This is important. ? If you had a tube placed to keep urine flowing (ureteral stent), ask your health care provider when you need to return to have it removed. Contact a health care provider if:  You have chills or a fever.  You have burning pain for longer than 24 hours after the procedure.  You have blood in your urine for longer than 24 hours after the procedure. Get help right away if:  You have large amounts of blood in your urine.  You have blood clots in your urine.  You have very bad pain.  You have chest pain or trouble breathing.  You are unable to urinate and you have the feeling of a full bladder. This information is not intended to replace advice given to you by your health care provider. Make sure you discuss any questions you have with your health care provider. Document Released: 10/14/2013 Document Revised: 07/25/2016 Document Reviewed: 07/21/2016 Elsevier Interactive Patient Education  [employer].

## 2017-11-28 NOTE — Transfer of Care (Signed)
Immediate Anesthesia Transfer of Care Note  Patient: Courtney Orozco  Procedure(s) Performed: CYSTOSCOPY,  REMOVAL OF RIGHT URETERAL STENT, RIGHT RETROGRADE PYELOGRAM, RIGHT URETEROSCOPY, STONE BASKET EXTRACTION RIGHT URETERAL CALCULUS (Right )  Patient Location: PACU  Anesthesia Type:General  Level of Consciousness: awake and patient cooperative  Airway & Oxygen Therapy: Patient Spontanous Breathing and Patient connected to face mask oxygen  Post-op Assessment: Report given to RN, Post -op Vital signs reviewed and stable and Patient moving all extremities  Post vital signs: Reviewed and stable  Last Vitals:  Vitals:   11/28/17 1305 11/28/17 1310  BP: 126/61   Pulse:    Resp: 19 (!) 31  Temp:    SpO2: 95% 96%    Last Pain:  Vitals:   11/28/17 1200  TempSrc: Oral  PainSc: 5       Patients Stated Pain Goal: 8 (38/33/38 3291)  Complications: No apparent anesthesia complications

## 2017-11-28 NOTE — Op Note (Signed)
Preoperative diagnosis: Right ureteral stone  Postoperative diagnosis: Same  Procedure: 1 cystoscopy 2 right retrograde pyelography 3.  Intraoperative fluoroscopy, under one hour, with interpretation 4.  Right ureteroscopic stone manipulation with basket extraction 5.  Right 6 x 26 JJ stent removal  Attending: Rosie Fate  Anesthesia: General  Estimated blood loss: None  Drains: none  Specimens: stone for analysis  Antibiotics: rocephin  Findings: no hydronephrosis. Mild calcification on the stent. 24mm right mid ureteral calculus.  Indications: Patient is a 67 year old female with a history of a right ureteral stone and who underwent previous stent placement for sepsis. She now presents for stone extraction.  After discussing treatment options, she decided proceed with right ureteroscopic stone manipulation.  Procedure her in detail: The patient was brought to the operating room and a brief timeout was done to ensure correct patient, correct procedure, correct site.  General anesthesia was administered patient was placed in dorsal lithotomy position.  Her genitalia was then prepped and draped in usual sterile fashion.  A rigid 26 French cystoscope was passed in the urethra and the bladder.  Bladder was inspected free masses or lesions.  the right ureteral orifices were in the normal orthotopic locations. Using a grasper the stent was brought to the urethra meatus. We then advanced a zipwire through the stent and up to the renal pelvis. We then removed the stent.  a 6 french ureteral catheter was then instilled into the right ureter orifice.  a gentle retrograde was obtained and findings noted above.   we then removed the cystoscope and cannulated the right ureteral orifice with a semirigid ureteroscope.  we then encountered the stone in the mid ureter.  using an Ngage basket the stone was removed. Due to the uncomplicated ureteroscopy a stent was not placed.  the bladder was then  drained and this concluded the procedure which was well tolerated by patient.  Complications: None  Condition: Stable, extubated, transferred to PACU  Plan: Patient is to be discharged home as to follow-up in one week

## 2017-11-28 NOTE — Anesthesia Procedure Notes (Addendum)
Procedure Name: LMA Insertion Date/Time: 11/28/2017 1:28 PM Performed by: Charmaine Downs, CRNA Pre-anesthesia Checklist: Patient identified, Patient being monitored, Emergency Drugs available, Timeout performed and Suction available Patient Re-evaluated:Patient Re-evaluated prior to induction Oxygen Delivery Method: Circle System Utilized Preoxygenation: Pre-oxygenation with 100% oxygen Induction Type: IV induction Ventilation: Mask ventilation without difficulty LMA: LMA inserted LMA Size: 4.0 Number of attempts: 1 Placement Confirmation: positive ETCO2 and breath sounds checked- equal and bilateral Tube secured with: Tape Dental Injury: Teeth and Oropharynx as per pre-operative assessment

## 2017-11-28 NOTE — Anesthesia Procedure Notes (Signed)
Performed by: Tashawn Greff J, CRNA       

## 2017-11-28 NOTE — H&P (Signed)
Urology Admission H&P  Chief Complaint: right flank pain  History of Present Illness: Courtney Orozco is a 67yo with a hx of nephrolithiasis who is s/p R stent placement for a 34mm right distal stone and sepsis. She now presents for definitive therapy. She has urgency and frequency with the stent. NO fevers  Past Medical History:  Diagnosis Date  . Anxiety   . Arthritis   . Back pain   . Depression   . Diabetes mellitus without complication ([place])    no meds in 5 years   . GERD (gastroesophageal reflux disease)   . Headache    hx of migraines   . Hepatitis    hx of hep B - 44 years ago   . History of kidney stones   . Hypertension   . Hypothyroidism   . Pneumonia    hx of 01/2015   . PONV (postoperative nausea and vomiting)    pt states "the last two times ive been put to sleep, my oxygen drops low and they have trouble waking me up.  . Thyroid disease    Past Surgical History:  Procedure Laterality Date  . ABDOMINAL HYSTERECTOMY    . APPENDECTOMY    . BACK SURGERY     x 4  . bladder tack surgery     . BREAST SURGERY Right    removal of 3 benign tumors  . CATARACT EXTRACTION W/PHACO Left 04/30/2017   Procedure: CATARACT EXTRACTION PHACO AND INTRAOCULAR LENS PLACEMENT (IOC);  Surgeon: Tonny Branch, MD;  Location: [facility];  Service: Ophthalmology;  Laterality: Left;  CDE: 7.16  . CATARACT EXTRACTION W/PHACO Right 05/14/2017   Procedure: CATARACT EXTRACTION PHACO AND INTRAOCULAR LENS PLACEMENT RIGHT EYE;  Surgeon: Tonny Branch, MD;  Location: [facility];  Service: Ophthalmology;  Laterality: Right;  CDE: 6.63  . CYSTOSCOPY WITH STENT PLACEMENT Right 09/28/2017   Procedure: CYSTOSCOPY WITH STENT PLACEMENT;  Surgeon: Franchot Gallo, MD;  Location: [facility];  Service: Urology;  Laterality: Right;  . CYSTOSCOPY WITH URETEROSCOPY, STONE BASKETRY AND STENT PLACEMENT Right 04/06/2015   Procedure: CYSTOSCOPY WITH RIGHT URETEROSCOPY, STONE BASKETRY AND STENT PLACEMENT;  Surgeon: Irine Seal, MD;   Location: [facility];  Service: Urology;  Laterality: Right;  . HOLMIUM LASER APPLICATION Right 6/78/9381   Procedure: HOLMIUM LASER APPLICATION;  Surgeon: Irine Seal, MD;  Location: [facility];  Service: Urology;  Laterality: Right;  . KNEE SURGERY     left  . ROTATOR CUFF REPAIR Left   . thumb surgery     left    Home Medications:  Current Facility-Administered Medications  Medication Dose Route Frequency Provider Last Rate Last Dose  . cefTRIAXone (ROCEPHIN) 2 g in dextrose 5 % 50 mL IVPB  2 g Intravenous 30 min Pre-Op Maggie Senseney, Candee Furbish, MD      . lactated ringers infusion   Intravenous Continuous Lerry Liner, MD 75 mL/hr at 11/28/17 1230    . sterile water for irrigation for irrigation    PRN Alyson Ingles Candee Furbish, MD   1,000 mL at 11/28/17 1258   Allergies:  Allergies  Allergen Reactions  . Ciprofloxacin Hives    Can take levaquin with no problems per patient and family  . Macrobid [Nitrofurantoin] Nausea And Vomiting  . Azithromycin Itching and Swelling  . Sulfa Antibiotics Itching    Family History  Problem Relation Age of Onset  . COPD Father    Social History:  reports that she has been smoking cigarettes.  She has a 25.00  pack-year smoking history. she has never used smokeless tobacco. She reports that she does not drink alcohol or use drugs.  Review of Systems  Genitourinary: Positive for frequency, hematuria and urgency.  All other systems reviewed and are negative.   Physical Exam:  Vital signs in last 24 hours: Temp:  [97.9 F (36.6 C)-98.5 F (36.9 C)] 97.9 F (36.6 C) (02/06 1200) Pulse Rate:  [88] 88 (02/06 1200) Resp:  [16-28] 18 (02/06 1240) BP: (137-147)/(60-69) 147/69 (02/06 1200) SpO2:  [93 %-98 %] 95 % (02/06 1240) Weight:  [90.7 kg (200 lb)] 90.7 kg (200 lb) (02/05 1322) Physical Exam  Constitutional: She is oriented to person, place, and time. She appears well-developed and well-nourished.  HENT:  Head: Normocephalic and atraumatic.  Eyes: EOM  are normal. Pupils are equal, round, and reactive to light.  Neck: Normal range of motion. No thyromegaly present.  Cardiovascular: Normal rate and regular rhythm.  Respiratory: Effort normal. No respiratory distress.  GI: Soft. She exhibits no distension.  Musculoskeletal: Normal range of motion. She exhibits no edema.  Neurological: She is alert and oriented to person, place, and time.  Skin: Skin is warm and dry.  Psychiatric: She has a normal mood and affect. Her behavior is normal. Judgment and thought content normal.    Laboratory Data:  Results for orders placed or performed during the hospital encounter of 11/28/17 (from the past 24 hour(s))  Glucose, capillary     Status: None   Collection Time: 11/28/17 11:45 AM  Result Value Ref Range   Glucose-Capillary 83 65 - 99 mg/dL   No results found for this or any previous visit (from the past 240 hour(s)). Creatinine: No results for input(s): CREATININE in the last 168 hours. Baseline Creatinine: unknwon  Impression/Assessment:  66yo with right ureteral calculus  Plan:  The risks/benefits/alternatives to right ureteroscopic stone extraction was explained to the patient and she understands and wishes to proceed with surgery  Nicolette Bang 11/28/2017, 1:09 PM

## 2017-11-29 ENCOUNTER — Encounter (HOSPITAL_COMMUNITY): Payer: Self-pay | Admitting: Urology

## 2017-12-04 LAB — STONE ANALYSIS
CA OXALATE, DIHYDRATE: 10 %
CA OXALATE, MONOHYDR.: 85 %
CA PHOS CRY STONE QL IR: 5 %
Stone Weight KSTONE: 53.9 mg

## 2017-12-05 ENCOUNTER — Ambulatory Visit: Payer: Medicare Other | Admitting: Urology

## 2017-12-19 ENCOUNTER — Ambulatory Visit (INDEPENDENT_AMBULATORY_CARE_PROVIDER_SITE_OTHER): Payer: Medicare Other | Admitting: Urology

## 2017-12-19 DIAGNOSIS — N201 Calculus of ureter: Secondary | ICD-10-CM | POA: Diagnosis not present

## 2017-12-31 ENCOUNTER — Ambulatory Visit: Payer: Medicare Other | Admitting: Orthopedic Surgery

## 2018-01-21 ENCOUNTER — Ambulatory Visit (HOSPITAL_COMMUNITY)
Admission: RE | Admit: 2018-01-21 | Discharge: 2018-01-21 | Disposition: A | Discharge status: Home / Self Care | Payer: Medicare Other | Source: Ambulatory Visit | Attending: Urology | Admitting: Urology

## 2018-01-21 ENCOUNTER — Other Ambulatory Visit: Payer: Self-pay | Admitting: Urology

## 2018-01-21 DIAGNOSIS — N201 Calculus of ureter: Secondary | ICD-10-CM | POA: Insufficient documentation

## 2018-01-23 ENCOUNTER — Ambulatory Visit: Payer: Medicare Other | Admitting: Urology

## 2018-01-23 DIAGNOSIS — N200 Calculus of kidney: Secondary | ICD-10-CM

## 2018-04-18 ENCOUNTER — Encounter (INDEPENDENT_AMBULATORY_CARE_PROVIDER_SITE_OTHER): Payer: Self-pay | Admitting: *Deleted

## 2018-05-22 ENCOUNTER — Other Ambulatory Visit: Payer: Self-pay

## 2018-05-22 ENCOUNTER — Emergency Department (HOSPITAL_COMMUNITY): Payer: Medicare Other

## 2018-05-22 ENCOUNTER — Encounter (HOSPITAL_COMMUNITY): Payer: Self-pay | Admitting: Emergency Medicine

## 2018-05-22 ENCOUNTER — Inpatient Hospital Stay (HOSPITAL_COMMUNITY)
Admission: EM | Admit: 2018-05-22 | Discharge: 2018-05-25 | DRG: 871 | Disposition: A | Discharge status: Home Health | Payer: Medicare Other | Attending: Internal Medicine | Admitting: Internal Medicine

## 2018-05-22 DIAGNOSIS — Z8673 Personal history of transient ischemic attack (TIA), and cerebral infarction without residual deficits: Secondary | ICD-10-CM

## 2018-05-22 DIAGNOSIS — Z79899 Other long term (current) drug therapy: Secondary | ICD-10-CM

## 2018-05-22 DIAGNOSIS — A419 Sepsis, unspecified organism: Secondary | ICD-10-CM | POA: Diagnosis present

## 2018-05-22 DIAGNOSIS — Z825 Family history of asthma and other chronic lower respiratory diseases: Secondary | ICD-10-CM

## 2018-05-22 DIAGNOSIS — Z8619 Personal history of other infectious and parasitic diseases: Secondary | ICD-10-CM

## 2018-05-22 DIAGNOSIS — Z6833 Body mass index (BMI) 33.0-33.9, adult: Secondary | ICD-10-CM

## 2018-05-22 DIAGNOSIS — Y95 Nosocomial condition: Secondary | ICD-10-CM | POA: Diagnosis present

## 2018-05-22 DIAGNOSIS — F419 Anxiety disorder, unspecified: Secondary | ICD-10-CM | POA: Diagnosis present

## 2018-05-22 DIAGNOSIS — M1990 Unspecified osteoarthritis, unspecified site: Secondary | ICD-10-CM | POA: Diagnosis present

## 2018-05-22 DIAGNOSIS — Z961 Presence of intraocular lens: Secondary | ICD-10-CM | POA: Diagnosis present

## 2018-05-22 DIAGNOSIS — Z87442 Personal history of urinary calculi: Secondary | ICD-10-CM | POA: Diagnosis not present

## 2018-05-22 DIAGNOSIS — Z79890 Hormone replacement therapy (postmenopausal): Secondary | ICD-10-CM

## 2018-05-22 DIAGNOSIS — M549 Dorsalgia, unspecified: Secondary | ICD-10-CM | POA: Diagnosis present

## 2018-05-22 DIAGNOSIS — I10 Essential (primary) hypertension: Secondary | ICD-10-CM | POA: Diagnosis present

## 2018-05-22 DIAGNOSIS — G9341 Metabolic encephalopathy: Secondary | ICD-10-CM | POA: Diagnosis present

## 2018-05-22 DIAGNOSIS — Z882 Allergy status to sulfonamides status: Secondary | ICD-10-CM

## 2018-05-22 DIAGNOSIS — I700 Atherosclerosis of aorta: Secondary | ICD-10-CM | POA: Diagnosis present

## 2018-05-22 DIAGNOSIS — Z79891 Long term (current) use of opiate analgesic: Secondary | ICD-10-CM

## 2018-05-22 DIAGNOSIS — Z90710 Acquired absence of both cervix and uterus: Secondary | ICD-10-CM

## 2018-05-22 DIAGNOSIS — N390 Urinary tract infection, site not specified: Secondary | ICD-10-CM | POA: Diagnosis not present

## 2018-05-22 DIAGNOSIS — Z881 Allergy status to other antibiotic agents status: Secondary | ICD-10-CM | POA: Diagnosis not present

## 2018-05-22 DIAGNOSIS — R630 Anorexia: Secondary | ICD-10-CM | POA: Diagnosis present

## 2018-05-22 DIAGNOSIS — F32A Depression, unspecified: Secondary | ICD-10-CM | POA: Diagnosis present

## 2018-05-22 DIAGNOSIS — A4151 Sepsis due to Escherichia coli [E. coli]: Principal | ICD-10-CM | POA: Diagnosis present

## 2018-05-22 DIAGNOSIS — F17210 Nicotine dependence, cigarettes, uncomplicated: Secondary | ICD-10-CM | POA: Diagnosis present

## 2018-05-22 DIAGNOSIS — Z7982 Long term (current) use of aspirin: Secondary | ICD-10-CM

## 2018-05-22 DIAGNOSIS — F329 Major depressive disorder, single episode, unspecified: Secondary | ICD-10-CM | POA: Diagnosis present

## 2018-05-22 DIAGNOSIS — Z87440 Personal history of urinary (tract) infections: Secondary | ICD-10-CM

## 2018-05-22 DIAGNOSIS — Z888 Allergy status to other drugs, medicaments and biological substances status: Secondary | ICD-10-CM

## 2018-05-22 DIAGNOSIS — R509 Fever, unspecified: Secondary | ICD-10-CM | POA: Diagnosis present

## 2018-05-22 DIAGNOSIS — E039 Hypothyroidism, unspecified: Secondary | ICD-10-CM | POA: Diagnosis present

## 2018-05-22 DIAGNOSIS — K219 Gastro-esophageal reflux disease without esophagitis: Secondary | ICD-10-CM | POA: Diagnosis present

## 2018-05-22 DIAGNOSIS — Z7951 Long term (current) use of inhaled steroids: Secondary | ICD-10-CM

## 2018-05-22 DIAGNOSIS — Z8701 Personal history of pneumonia (recurrent): Secondary | ICD-10-CM | POA: Diagnosis not present

## 2018-05-22 DIAGNOSIS — J9601 Acute respiratory failure with hypoxia: Secondary | ICD-10-CM | POA: Diagnosis present

## 2018-05-22 DIAGNOSIS — G8929 Other chronic pain: Secondary | ICD-10-CM | POA: Diagnosis present

## 2018-05-22 DIAGNOSIS — Z1612 Extended spectrum beta lactamase (ESBL) resistance: Secondary | ICD-10-CM | POA: Diagnosis present

## 2018-05-22 DIAGNOSIS — E119 Type 2 diabetes mellitus without complications: Secondary | ICD-10-CM

## 2018-05-22 DIAGNOSIS — J189 Pneumonia, unspecified organism: Secondary | ICD-10-CM | POA: Diagnosis present

## 2018-05-22 DIAGNOSIS — Z9841 Cataract extraction status, right eye: Secondary | ICD-10-CM

## 2018-05-22 DIAGNOSIS — Z9842 Cataract extraction status, left eye: Secondary | ICD-10-CM | POA: Diagnosis not present

## 2018-05-22 DIAGNOSIS — E669 Obesity, unspecified: Secondary | ICD-10-CM | POA: Diagnosis present

## 2018-05-22 HISTORY — DX: Transient cerebral ischemic attack, unspecified: G45.9

## 2018-05-22 HISTORY — DX: Other amnesia: R41.3

## 2018-05-22 HISTORY — DX: Unsteadiness on feet: R26.81

## 2018-05-22 LAB — I-STAT CG4 LACTIC ACID, ED: LACTIC ACID, VENOUS: 0.98 mmol/L (ref 0.5–1.9)

## 2018-05-22 LAB — CBC WITH DIFFERENTIAL/PLATELET
Basophils Absolute: 0 10*3/uL (ref 0.0–0.1)
Basophils Relative: 0 %
Eosinophils Absolute: 0 10*3/uL (ref 0.0–0.7)
Eosinophils Relative: 0 %
HEMATOCRIT: 39.8 % (ref 36.0–46.0)
HEMOGLOBIN: 13.1 g/dL (ref 12.0–15.0)
LYMPHS ABS: 1 10*3/uL (ref 0.7–4.0)
LYMPHS PCT: 7 %
MCH: 31.3 pg (ref 26.0–34.0)
MCHC: 32.9 g/dL (ref 30.0–36.0)
MCV: 95 fL (ref 78.0–100.0)
MONOS PCT: 6 %
Monocytes Absolute: 0.8 10*3/uL (ref 0.1–1.0)
NEUTROS ABS: 11.1 10*3/uL — AB (ref 1.7–7.7)
NEUTROS PCT: 87 %
Platelets: 227 10*3/uL (ref 150–400)
RBC: 4.19 MIL/uL (ref 3.87–5.11)
RDW: 13.2 % (ref 11.5–15.5)
WBC: 12.9 10*3/uL — ABNORMAL HIGH (ref 4.0–10.5)

## 2018-05-22 LAB — COMPREHENSIVE METABOLIC PANEL
ALT: 15 U/L (ref 0–44)
AST: 13 U/L — AB (ref 15–41)
Albumin: 3.1 g/dL — ABNORMAL LOW (ref 3.5–5.0)
Alkaline Phosphatase: 92 U/L (ref 38–126)
Anion gap: 8 (ref 5–15)
BUN: 16 mg/dL (ref 8–23)
CHLORIDE: 110 mmol/L (ref 98–111)
CO2: 19 mmol/L — AB (ref 22–32)
Calcium: 8.5 mg/dL — ABNORMAL LOW (ref 8.9–10.3)
Creatinine, Ser: 1.19 mg/dL — ABNORMAL HIGH (ref 0.44–1.00)
GFR calc Af Amer: 53 mL/min — ABNORMAL LOW (ref >60)
GFR calc non Af Amer: 46 mL/min — ABNORMAL LOW (ref >60)
Glucose, Bld: 143 mg/dL — ABNORMAL HIGH (ref 70–99)
POTASSIUM: 4.1 mmol/L (ref 3.5–5.1)
SODIUM: 137 mmol/L (ref 135–145)
Total Bilirubin: 0.7 mg/dL (ref 0.3–1.2)
Total Protein: 6.9 g/dL (ref 6.5–8.1)

## 2018-05-22 LAB — CBG MONITORING, ED
Glucose-Capillary: 137 mg/dL — ABNORMAL HIGH (ref 70–99)
Glucose-Capillary: 145 mg/dL — ABNORMAL HIGH (ref 70–99)

## 2018-05-22 LAB — URINALYSIS, ROUTINE W REFLEX MICROSCOPIC
Bilirubin Urine: NEGATIVE
GLUCOSE, UA: NEGATIVE mg/dL
KETONES UR: NEGATIVE mg/dL
Nitrite: POSITIVE — AB
PROTEIN: 30 mg/dL — AB
Specific Gravity, Urine: 1.014 (ref 1.005–1.030)
pH: 5 (ref 5.0–8.0)

## 2018-05-22 LAB — TROPONIN I

## 2018-05-22 MED ORDER — ONDANSETRON HCL 4 MG PO TABS: 4.0000 mg | ORAL_TABLET | Freq: Four times a day (QID) | ORAL | Status: DC | PRN

## 2018-05-22 MED ORDER — INSULIN ASPART 100 UNIT/ML ~~LOC~~ SOLN
0.0000 [IU] | Freq: Three times a day (TID) | SUBCUTANEOUS | Status: DC
  Administered 2018-05-23 – 2018-05-25 (×2): 1 [IU] via SUBCUTANEOUS

## 2018-05-22 MED ORDER — ALBUTEROL SULFATE (2.5 MG/3ML) 0.083% IN NEBU
2.5000 mg | INHALATION_SOLUTION | RESPIRATORY_TRACT | Status: DC | PRN
  Administered 2018-05-23 (×2): 2.5 mg via RESPIRATORY_TRACT
  Filled 2018-05-22 (×2): qty 3

## 2018-05-22 MED ORDER — SODIUM CHLORIDE 0.9 % IV SOLN
INTRAVENOUS | Status: AC
  Administered 2018-05-23 (×2): via INTRAVENOUS

## 2018-05-22 MED ORDER — SODIUM CHLORIDE 0.9 % IV SOLN
1000.0000 mL | INTRAVENOUS | Status: DC
  Administered 2018-05-22: 1000 mL via INTRAVENOUS

## 2018-05-22 MED ORDER — KETOROLAC TROMETHAMINE 15 MG/ML IJ SOLN
15.0000 mg | Freq: Once | INTRAMUSCULAR | Status: AC
  Administered 2018-05-22: 15 mg via INTRAVENOUS
  Filled 2018-05-22: qty 1

## 2018-05-22 MED ORDER — VANCOMYCIN HCL IN DEXTROSE 1-5 GM/200ML-% IV SOLN: 1000.0000 mg | Freq: Once | INTRAVENOUS | Status: DC

## 2018-05-22 MED ORDER — SODIUM CHLORIDE 0.9 % IV SOLN
1.0000 g | Freq: Three times a day (TID) | INTRAVENOUS | Status: DC
  Filled 2018-05-22 (×2): qty 1

## 2018-05-22 MED ORDER — VANCOMYCIN HCL IN DEXTROSE 1-5 GM/200ML-% IV SOLN
1000.0000 mg | Freq: Once | INTRAVENOUS | Status: AC
  Administered 2018-05-22: 1000 mg via INTRAVENOUS
  Filled 2018-05-22: qty 200

## 2018-05-22 MED ORDER — ONDANSETRON HCL 4 MG/2ML IJ SOLN: 4.0000 mg | Freq: Four times a day (QID) | INTRAMUSCULAR | Status: DC | PRN

## 2018-05-22 MED ORDER — ACETAMINOPHEN 650 MG RE SUPP: 650.0000 mg | Freq: Four times a day (QID) | RECTAL | Status: DC | PRN

## 2018-05-22 MED ORDER — SODIUM CHLORIDE 0.9 % IV BOLUS
1748.0000 mL | Freq: Once | INTRAVENOUS | Status: AC
  Administered 2018-05-22: 1748 mL via INTRAVENOUS

## 2018-05-22 MED ORDER — HEPARIN SODIUM (PORCINE) 5000 UNIT/ML IJ SOLN
5000.0000 [IU] | Freq: Three times a day (TID) | INTRAMUSCULAR | Status: DC
  Administered 2018-05-23 – 2018-05-25 (×8): 5000 [IU] via SUBCUTANEOUS
  Filled 2018-05-22 (×8): qty 1

## 2018-05-22 MED ORDER — PIPERACILLIN-TAZOBACTAM 3.375 G IVPB 30 MIN
3.3750 g | Freq: Once | INTRAVENOUS | Status: AC
  Administered 2018-05-22: 3.375 g via INTRAVENOUS
  Filled 2018-05-22: qty 50

## 2018-05-22 MED ORDER — ACETAMINOPHEN 325 MG PO TABS
650.0000 mg | ORAL_TABLET | Freq: Four times a day (QID) | ORAL | Status: DC | PRN
  Administered 2018-05-23 (×2): 650 mg via ORAL
  Filled 2018-05-22 (×2): qty 2

## 2018-05-22 MED ORDER — ACETAMINOPHEN 325 MG PO TABS
650.0000 mg | ORAL_TABLET | Freq: Once | ORAL | Status: AC
  Administered 2018-05-22: 650 mg via ORAL
  Filled 2018-05-22: qty 2

## 2018-05-22 MED ORDER — PIPERACILLIN-TAZOBACTAM 3.375 G IVPB: 3.3750 g | Freq: Three times a day (TID) | INTRAVENOUS | Status: DC

## 2018-05-22 NOTE — ED Triage Notes (Signed)
Per ems pt was seen at dr today for possible uti because of confusion per family, but pt was unable to get a urine sample. Per family confusion started yesterday. When ems arrived pts o2 was 56% and temp was 103 orally. Ems gave 1 gram of rocephin and 2 500 bag bolus of NS.

## 2018-05-22 NOTE — Progress Notes (Addendum)
Pharmacy Antibiotic Note  Courtney Orozco is a 67 y.o. female admitted on 05/22/2018 with sepsis.  Pharmacy has been consulted for vancomycin and zosyn dosing.  Plan: Vancomycin 2000mg  IV x 1 dose, followed by vancomycin 750mg  iv q12h.  Goal trough 15-20 mcg/mL. Zosyn 3.375gm iv q8h EID  Height: 5\' 5"  (165.1 cm) Weight: 202 lb (91.6 kg) IBW/kg (Calculated) : 57  Temp (24hrs), Avg:103.2 F (39.6 C), Min:103.2 F (39.6 C), Max:103.2 F (39.6 C)  Recent Labs  Lab 05/22/18 1945 05/22/18 1956  WBC 12.9*  --   CREATININE 1.19*  --   LATICACIDVEN  --  0.98    Estimated Creatinine Clearance: 50.6 mL/min (A) (by C-G formula based on SCr of 1.19 mg/dL (H)).    Allergies  Allergen Reactions  . Ciprofloxacin Hives    Can take levaquin with no problems per patient and family  . Macrobid [Nitrofurantoin] Nausea And Vomiting  . Azithromycin Itching and Swelling  . Sulfa Antibiotics Itching    Antimicrobials this admission: 7/31 zosyn >>  7/31 vancomycin >>   Microbiology results: 7/31 BCx:  7/31 UCx:     Thank you for allowing pharmacy to be a part of this patient's care.  Donna Christen Guida Asman 05/22/2018 8:54 PM

## 2018-05-22 NOTE — ED Provider Notes (Signed)
[facility] EMERGENCY DEPARTMENT Provider Note   CSN: [id number] Arrival date & time: 05/22/18  1854     History   Chief Complaint Chief Complaint  Patient presents with  . Altered Mental Status    HPI Courtney Orozco is a 67 y.o. female.  HPI  Pt was seen at 1915. Per EMS, pt's family, and pt: c/o gradual onset and persistence of worsening "confusion" since yesterday. Has been associated with generalized weakness and dysuria. Pt was evaluated by her PMD today, told "possible UTI" (pt has hx of recurrent UTI), but were unable to obtain urine sample. Pt was rx amoxicillin. Pt's family states pt was "talking out of her head," so they called EMS. EMS states pt's O2 Sats were "56%" and pt's temp was "103 orally" on their arrival to scene. EMS gave IV NS 1024ml and IV rocephin 1gm en route to the ED. Pt's family did not know pt had a fever. Pt herself denies CP/SOB, no cough, no back pain, no abd pain, no N/V/D, no rash, no neck pain, no headache, no focal motor weakness.    Past Medical History:  Diagnosis Date  . Anxiety   . Arthritis   . Back pain   . Depression   . Diabetes mellitus without complication ([place])    no meds in 5 years   . Gait instability   . GERD (gastroesophageal reflux disease)   . Headache    hx of migraines   . Hepatitis    hx of hep B - 44 years ago   . History of kidney stones   . Hypertension   . Hypothyroidism   . Memory loss   . Pneumonia    hx of 01/2015   . PONV (postoperative nausea and vomiting)    pt states "the last two times ive been put to sleep, my oxygen drops low and they have trouble waking me up.  . Thyroid disease   . TIA (transient ischemic attack)     Patient Active Problem List   Diagnosis Date Noted  . Bacteremia 10/01/2017  . Acute metabolic encephalopathy 23/53/6144  . Shortness of breath 09/30/2017  . UTI (urinary tract infection) 09/28/2017  . Leukocytosis 09/28/2017  . Right ureteral stone   . Syncope 01/22/2017  .  Renal calculus, right 04/07/2015  . Renal stone 04/06/2015    Past Surgical History:  Procedure Laterality Date  . ABDOMINAL HYSTERECTOMY    . APPENDECTOMY    . BACK SURGERY     x 4  . bladder tack surgery     . BREAST SURGERY Right    removal of 3 benign tumors  . CATARACT EXTRACTION W/PHACO Left 04/30/2017   Procedure: CATARACT EXTRACTION PHACO AND INTRAOCULAR LENS PLACEMENT (IOC);  Surgeon: Tonny Branch, MD;  Location: [facility];  Service: Ophthalmology;  Laterality: Left;  CDE: 7.16  . CATARACT EXTRACTION W/PHACO Right 05/14/2017   Procedure: CATARACT EXTRACTION PHACO AND INTRAOCULAR LENS PLACEMENT RIGHT EYE;  Surgeon: Tonny Branch, MD;  Location: [facility];  Service: Ophthalmology;  Laterality: Right;  CDE: 6.63  . CYSTOSCOPY WITH STENT PLACEMENT Right 09/28/2017   Procedure: CYSTOSCOPY WITH STENT PLACEMENT;  Surgeon: Franchot Gallo, MD;  Location: [facility];  Service: Urology;  Laterality: Right;  . CYSTOSCOPY WITH URETEROSCOPY, STONE BASKETRY AND STENT PLACEMENT Right 04/06/2015   Procedure: CYSTOSCOPY WITH RIGHT URETEROSCOPY, STONE BASKETRY AND STENT PLACEMENT;  Surgeon: Irine Seal, MD;  Location: [facility];  Service: Urology;  Laterality: Right;  . CYSTOSCOPY/RETROGRADE/URETEROSCOPY Right  11/28/2017   Procedure: CYSTOSCOPY,  REMOVAL OF RIGHT URETERAL STENT, RIGHT RETROGRADE PYELOGRAM, RIGHT URETEROSCOPY, STONE BASKET EXTRACTION RIGHT URETERAL CALCULUS;  Surgeon: Cleon Gustin, MD;  Location: [facility];  Service: Urology;  Laterality: Right;  . HOLMIUM LASER APPLICATION Right 5/36/1443   Procedure: HOLMIUM LASER APPLICATION;  Surgeon: Irine Seal, MD;  Location: [facility];  Service: Urology;  Laterality: Right;  . KNEE SURGERY     left  . ROTATOR CUFF REPAIR Left   . thumb surgery     left     OB History   None      Home Medications    Prior to Admission medications   Medication Sig Start Date End Date Taking? Authorizing Provider  albuterol (PROVENTIL HFA;VENTOLIN HFA) 108 (90 Base)  MCG/ACT inhaler Inhale 2 puffs into the lungs every 6 (six) hours as needed for wheezing or shortness of breath.    [provider]  aspirin 325 MG tablet Take 325 mg by mouth daily.    [provider]  buPROPion (WELLBUTRIN XL) 300 MG 24 hr tablet Take 300 mg by mouth daily.  02/10/15   [provider]  cyclobenzaprine (FLEXERIL) 5 MG tablet Take 5 mg by mouth 3 (three) times daily as needed for muscle spasms.     [provider]  diazepam (VALIUM) 5 MG tablet Take 5 mg by mouth every 8 (eight) hours as needed for anxiety or muscle spasms.  03/24/15   [provider]  Flax Oil-Fish Oil-Borage Oil CAPS Take 1 capsule by mouth daily.    [provider]  Fluticasone-Salmeterol (ADVAIR DISKUS) 250-50 MCG/DOSE AEPB Inhale 1 puff into the lungs daily as needed (shortness of breath).  09/17/17   [provider]  HYDROcodone-acetaminophen (NORCO/VICODIN) 5-325 MG tablet Take 1 tablet by mouth every 4 (four) hours as needed for moderate pain. 11/28/17   McKenzie, Candee Furbish, MD  levothyroxine (SYNTHROID, LEVOTHROID) 100 MCG tablet Take 100 mcg by mouth daily. Take with 170mcg tablet for total dose of 227mcg daily    [provider]  levothyroxine (SYNTHROID, LEVOTHROID) 175 MCG tablet Take 175 mcg by mouth daily. Take one tablet with 197mcg tablet for total dose of 219mcg    [provider]  Multiple Vitamin (MULTIVITAMIN WITH MINERALS) TABS Take 1 tablet by mouth daily.    [provider]  nortriptyline (PAMELOR) 50 MG capsule Take 100 mg by mouth at bedtime.    [provider]  potassium chloride SA (KLOR-CON M20) 20 MEQ tablet Take 40 mEq by mouth 3 (three) times daily.     [provider]  topiramate (TOPAMAX) 100 MG tablet Take 200 mg by mouth at bedtime.    [provider]  torsemide (DEMADEX) 20 MG tablet Take 10 mg by mouth 2 (two) times daily as needed (fluid).  09/17/17   [provider]    Family History Family History  Problem Relation Age of Onset  . COPD Father     Social History Social History   Tobacco Use  . Smoking status: Current Every Day Smoker    Packs/day: 0.50    Years: 50.00    Pack years: 25.00    Types: Cigarettes  . Smokeless tobacco: Never Used  Substance Use Topics  . Alcohol use: No  . Drug use: No     Allergies   Ciprofloxacin; Macrobid [nitrofurantoin]; Azithromycin; and Sulfa antibiotics   Review of Systems Review of Systems ROS: Statement: All systems negative except as  marked or noted in the HPI; Constitutional: +fever and chills, generalized weakness/fatigue.. ; ; Eyes: Negative for eye pain, redness and discharge. ; ; ENMT: Negative for ear pain, hoarseness, nasal congestion, sinus pressure and sore throat. ; ; Cardiovascular: Negative for chest pain, palpitations, diaphoresis, dyspnea and peripheral edema. ; ; Respiratory: Negative for cough, wheezing and stridor. ; ; Gastrointestinal: Negative for nausea, vomiting, diarrhea, abdominal pain, blood in stool, hematemesis, jaundice and rectal bleeding. . ; ; Genitourinary: +dysuria. Negative for flank pain and hematuria. ; ; Musculoskeletal: Negative for back pain and neck pain. Negative for swelling and trauma.; ; Skin: Negative for pruritus, rash, abrasions, blisters, bruising and skin lesion.; ; Neuro: +intermittent confusion. Negative for headache, lightheadedness and neck stiffness. Negative for altered level of consciousness, extremity weakness, paresthesias, involuntary movement, seizure and syncope.      Physical Exam Updated Vital Signs BP (!) 123/98 (BP Location: Right Arm)   Pulse (!) 101   Temp (!) 103.2 F (39.6 C) (Rectal)   Resp (!) 26   Ht 5\' 5"  (1.651 m)   Wt 91.6 kg (202 lb)   SpO2 92%   BMI 33.61 kg/m    Patient Vitals for the past 24 hrs:  BP Temp Temp src Pulse Resp SpO2 Height Weight  05/22/18 2043 (!) 98/46 - - 95 20 96 % - -  05/22/18  1919 (!) 123/98 (!) 103.2 F (39.6 C) Rectal (!) 101 (!) 26 92 % - -  05/22/18 1911 - - - - - - 5\' 5"  (1.651 m) 91.6 kg (202 lb)     Physical Exam 1920: Physical examination:  Nursing notes reviewed; Vital signs and O2 SAT reviewed;  Constitutional: Well developed, Well nourished, In no acute distress; Head:  Normocephalic, atraumatic; Eyes: EOMI, PERRL, No scleral icterus; ENMT: Mouth and pharynx normal, Mucous membranes dry; Neck: Supple, Full range of motion, No lymphadenopathy; Cardiovascular: Tachycardic rate and rhythm, No gallop; Respiratory: Breath sounds clear & equal bilaterally, No wheezes. +moist cough during exam. Speaking full sentences with ease, Normal respiratory effort/excursion; Chest: Nontender, Movement normal; Abdomen: Soft, Nontender, Nondistended, Normal bowel sounds; Genitourinary: No CVA tenderness; Extremities: Peripheral pulses normal, No tenderness, No edema, No calf edema or asymmetry.; Neuro: Awake/alert, mild confused re: events. Major CN grossly intact. No facial droop. Speech clear. Moves all extremities spontaneously and to command without apparent gross focal motor deficits.; Skin: Color normal, Warm, Dry.   ED Treatments / Results  Labs (all labs ordered are listed, but only abnormal results are displayed)   EKG EKG Interpretation  Date/Time:  Wednesday May 22 2018 19:08:15 EDT Ventricular Rate:  100 PR Interval:    QRS Duration: 101 QT Interval:  351 QTC Calculation: 453 R Axis:   63 Text Interpretation:  Sinus tachycardia Borderline prolonged PR interval Borderline low voltage, extremity leads Baseline wander When compared with ECG of 09/30/2017 Rate faster Confirmed by Francine Graven [phone]) on 05/22/2018 7:34:43 PM   Radiology No results found.  Procedures Procedures (including critical care time)  Medications Ordered in ED Medications  0.9 %  sodium chloride infusion (1,000 mLs Intravenous New Bag/Given 05/22/18 1949)    piperacillin-tazobactam (ZOSYN) IVPB 3.375 g (3.375 g Intravenous New Bag/Given 05/22/18 2007)  vancomycin (VANCOCIN) IVPB 1000 mg/200 mL premix (1,000 mg Intravenous Not Given 05/22/18 2026)  vancomycin (VANCOCIN) IVPB 1000 mg/200 mL premix (1,000 mg Intravenous New Bag/Given 05/22/18 2007)  acetaminophen (TYLENOL) tablet 650 mg (650 mg Oral Given 05/22/18 2001)     Initial Impression /  Assessment and Plan / ED Course  I have reviewed the triage vital signs and the nursing notes.  Pertinent labs & imaging results that were available during my care of the patient were reviewed by me and considered in my medical decision making (see chart for details).  MDM Reviewed: previous chart, nursing note and vitals Reviewed previous: labs and ECG Interpretation: labs, ECG, x-ray and CT scan Total time providing critical care: 30-74 minutes. This excludes time spent performing separately reportable procedures and services. Consults: admitting MD   CRITICAL CARE Performed by: Francine Graven Total critical care time: 35 minutes Critical care time was exclusive of separately billable procedures and treating other patients. Critical care was necessary to treat or prevent imminent or life-threatening deterioration. Critical care was time spent personally by me on the following activities: development of treatment plan with patient and/or surrogate as well as nursing, discussions with consultants, evaluation of patient's response to treatment, examination of patient, obtaining history from patient or surrogate, ordering and performing treatments and interventions, ordering and review of laboratory studies, ordering and review of radiographic studies, pulse oximetry and re-evaluation of patient's condition.   Results for orders placed or performed during the hospital encounter of 05/22/18  Comprehensive metabolic panel  Result Value Ref Range   Sodium 137 135 - 145 mmol/L   Potassium 4.1 3.5 - 5.1  mmol/L   Chloride 110 98 - 111 mmol/L   CO2 19 (L) 22 - 32 mmol/L   Glucose, Bld 143 (H) 70 - 99 mg/dL   BUN 16 8 - 23 mg/dL   Creatinine, Ser 1.19 (H) 0.44 - 1.00 mg/dL   Calcium 8.5 (L) 8.9 - 10.3 mg/dL   Total Protein 6.9 6.5 - 8.1 g/dL   Albumin 3.1 (L) 3.5 - 5.0 g/dL   AST 13 (L) 15 - 41 U/L   ALT 15 0 - 44 U/L   Alkaline Phosphatase 92 38 - 126 U/L   Total Bilirubin 0.7 0.3 - 1.2 mg/dL   GFR calc non Af Amer 46 (L) >60 mL/min   GFR calc Af Amer 53 (L) >60 mL/min   Anion gap 8 5 - 15  CBC WITH DIFFERENTIAL  Result Value Ref Range   WBC 12.9 (H) 4.0 - 10.5 K/uL   RBC 4.19 3.87 - 5.11 MIL/uL   Hemoglobin 13.1 12.0 - 15.0 g/dL   HCT 39.8 36.0 - 46.0 %   MCV 95.0 78.0 - 100.0 fL   MCH 31.3 26.0 - 34.0 pg   MCHC 32.9 30.0 - 36.0 g/dL   RDW 13.2 11.5 - 15.5 %   Platelets 227 150 - 400 K/uL   Neutrophils Relative % 87 %   Neutro Abs 11.1 (H) 1.7 - 7.7 K/uL   Lymphocytes Relative 7 %   Lymphs Abs 1.0 0.7 - 4.0 K/uL   Monocytes Relative 6 %   Monocytes Absolute 0.8 0.1 - 1.0 K/uL   Eosinophils Relative 0 %   Eosinophils Absolute 0.0 0.0 - 0.7 K/uL   Basophils Relative 0 %   Basophils Absolute 0.0 0.0 - 0.1 K/uL  Urinalysis, Routine w reflex microscopic  Result Value Ref Range   Color, Urine YELLOW YELLOW   APPearance CLOUDY (A) CLEAR   Specific Gravity, Urine 1.014 1.005 - 1.030   pH 5.0 5.0 - 8.0   Glucose, UA NEGATIVE NEGATIVE mg/dL   Hgb urine dipstick SMALL (A) NEGATIVE   Bilirubin Urine NEGATIVE NEGATIVE   Ketones, ur NEGATIVE NEGATIVE mg/dL   Protein,  ur 30 (A) NEGATIVE mg/dL   Nitrite POSITIVE (A) NEGATIVE   Leukocytes, UA LARGE (A) NEGATIVE   RBC / HPF 6-10 0 - 5 RBC/hpf   WBC, UA >50 (H) 0 - 5 WBC/hpf   Bacteria, UA MANY (A) NONE SEEN   Squamous Epithelial / LPF 0-5 0 - 5   WBC Clumps PRESENT   Troponin I  Result Value Ref Range   Troponin I <0.03 <0.03 ng/mL  CBG monitoring, ED  Result Value Ref Range   Glucose-Capillary 137 (H) 70 - 99 mg/dL    Comment 1 Notify RN    Comment 2 Document in Chart   I-Stat CG4 Lactic Acid, ED  (not at  [facility])  Result Value Ref Range   Lactic Acid, Venous 0.98 0.5 - 1.9 mmol/L   Dg Chest 2 View Result Date: 05/22/2018 CLINICAL DATA:  67 y/o  F; fever. EXAM: CHEST - 2 VIEW COMPARISON:  09/30/2017 chest radiograph FINDINGS: Normal cardiac silhouette. Aortic atherosclerosis with calcification. Elevated right hemidiaphragm with colon superposition over the liver. Bibasilar opacity may represent pneumonia or atelectasis. No acute osseous abnormality is evident. IMPRESSION: 1. Right basilar opacity may represent pneumonia or atelectasis. 2. Stable elevated right hemidiaphragm and colon superposition over the liver. 3. Aortic atherosclerosis. Electronically Signed   By: Kristine Garbe M.D.   On: 05/22/2018 20:45    Results for ROCHANDA, HARPHAM (MRN [id number]) as of 05/22/2018 20:35  Ref. Range 09/29/2017 06:51 09/30/2017 22:51 10/01/2017 06:50 10/02/2017 04:14 05/22/2018 19:45  BUN Latest Ref Range: 8 - 23 mg/dL 16 19 16 16 16   Creatinine Latest Ref Range: 0.44 - 1.00 mg/dL 1.01 (H) 1.14 (H) 1.10 (H) 1.02 (H) 1.19 (H)     2140:  Code Sepsis called on pt's arrival. IV abx started after [facility] and UC obtained. Pt's BP now soft. Pt has already received 1L NS IV en route to the ED, will continue IVF bolus for total 30mg /kg. Pt remains awake/alert, resps easy, talking with family at bedside. Dx and testing d/w pt and family.  Questions answered.  Verb understanding, agreeable to admit.  T/C returned from [facility]. Olevia Bowens, case discussed, including:  HPI, pertinent PM/SHx, VS/PE, dx testing, ED course and treatment:  Agreeable to admit.      Final Clinical Impressions(s) / ED Diagnoses   Final diagnoses:  None    ED Discharge Orders    None       Francine Graven, DO 05/26/18 2395

## 2018-05-22 NOTE — H&P (Signed)
History and Physical    MCKYNLEIGH MUSSELL TIW:[id number] DOB: February 04, 1951 DOA: 05/22/2018  PCP: Galen Manila, MD   Patient coming from: Home.  I have personally briefly reviewed patient's old medical records in [place]  Chief Complaint: Confusion.  HPI: Courtney Orozco is a 67 y.o. female with medical history significant of anxiety, depression, osteoarthritis, chronic back pain, type 2 diabetes without complication, history of TIA, history of migraine headaches, history of gait instability, GERD, history of hepatitis B decades ago, urolithiasis, hypertension, hypothyroidism, history of pneumonia, history of TIA history of memory loss who is brought via EMS to the emergency department after she was taken to her PCP today by family members due to confusion, likely secondary to UTI.  When EMS evaluated the patient she was febrile with a temperature of 103 F and hypoxic with an O2 sat of 56% on room air.  EMS gave 1 g of ceftriaxone and 2500 mL of normal saline bolus before arriving to the emergency department.  She has been having cough for about a week associated with occasional yellowish sputum production and mild wheezing.  She last used her albuterol inhaler 2 days ago.  Complains of frontal headache and mild nausea this morning, but denies sore throat, hemoptysis, chest pain, palpitations, dizziness, PND, orthopnea or pitting edema of the lower extremities.  No abdominal pain, emesis, diarrhea, melena or hematochezia.  Positive frequency, but no dysuria or gross hematuria.  No polyuria, polydipsia, polyphagia or blurred vision.  Denies heat or cold intolerance.  ED Course: Initial vital signs temperature 103.2 F, pulse 101, respirations 26, blood pressure 123/98 mmHg and O2 sat 92% on room air.  Work-up shows urinalysis showing small hemoglobinuria, small proteinuria 30 mg/dL, positive nitrites, large leukocyte esterase, more than 50 WBC per hpf and many bacteria.  A white count was  12.9 with 87% neutrophils, 7% lymphocytes and 6% monocytes.  Hemoglobin 13.1 g/dL and platelets 227.  Blood cultures x2 and urine culture were collected.  Lactic acid was normal at 0.98 mmol/L.  Troponin was less than 0.03 ng/mL.  Sodium 137, potassium 4.1, chloride 110 and CO2 19 mmol S/L.  BUN 16, creatinine 1.19, glucose 143, calcium 8.5 mg/dL.  Total protein was 6.9 and albumin 1 g/dL.  AST was 13 units/L.  The rest of the hepatic function tests are within normal limits.  Imaging: A 2 view chest radiograph study showed a right basilar opacity that could be a pneumonia or atelectasis.  There is stable elevated right hemidiaphragm and color super position over the liver.  Aortic atherosclerosis.  Please see images and full radiology report for further detail.  Review of Systems: As per HPI otherwise 10 point review of systems negative.    Past Medical History:  Diagnosis Date  . Anxiety   . Arthritis   . Back pain   . Depression   . Diabetes mellitus without complication ([place])    no meds in 5 years   . Gait instability   . GERD (gastroesophageal reflux disease)   . Headache    hx of migraines   . Hepatitis    hx of hep B - 44 years ago   . History of kidney stones   . Hypertension   . Hypothyroidism   . Memory loss   . Pneumonia    hx of 01/2015   . PONV (postoperative nausea and vomiting)    pt states "the last two times ive been put to sleep, my  oxygen drops low and they have trouble waking me up.  . Thyroid disease   . TIA (transient ischemic attack)     Past Surgical History:  Procedure Laterality Date  . ABDOMINAL HYSTERECTOMY    . APPENDECTOMY    . BACK SURGERY     x 4  . bladder tack surgery     . BREAST SURGERY Right    removal of 3 benign tumors  . CATARACT EXTRACTION W/PHACO Left 04/30/2017   Procedure: CATARACT EXTRACTION PHACO AND INTRAOCULAR LENS PLACEMENT (IOC);  Surgeon: Tonny Branch, MD;  Location: [facility];  Service: Ophthalmology;  Laterality: Left;  CDE: 7.16   . CATARACT EXTRACTION W/PHACO Right 05/14/2017   Procedure: CATARACT EXTRACTION PHACO AND INTRAOCULAR LENS PLACEMENT RIGHT EYE;  Surgeon: Tonny Branch, MD;  Location: [facility];  Service: Ophthalmology;  Laterality: Right;  CDE: 6.63  . CYSTOSCOPY WITH STENT PLACEMENT Right 09/28/2017   Procedure: CYSTOSCOPY WITH STENT PLACEMENT;  Surgeon: Franchot Gallo, MD;  Location: [facility];  Service: Urology;  Laterality: Right;  . CYSTOSCOPY WITH URETEROSCOPY, STONE BASKETRY AND STENT PLACEMENT Right 04/06/2015   Procedure: CYSTOSCOPY WITH RIGHT URETEROSCOPY, STONE BASKETRY AND STENT PLACEMENT;  Surgeon: Irine Seal, MD;  Location: [facility];  Service: Urology;  Laterality: Right;  . CYSTOSCOPY/RETROGRADE/URETEROSCOPY Right 11/28/2017   Procedure: CYSTOSCOPY,  REMOVAL OF RIGHT URETERAL STENT, RIGHT RETROGRADE PYELOGRAM, RIGHT URETEROSCOPY, STONE BASKET EXTRACTION RIGHT URETERAL CALCULUS;  Surgeon: Cleon Gustin, MD;  Location: [facility];  Service: Urology;  Laterality: Right;  . HOLMIUM LASER APPLICATION Right 3/87/5643   Procedure: HOLMIUM LASER APPLICATION;  Surgeon: Irine Seal, MD;  Location: [facility];  Service: Urology;  Laterality: Right;  . KNEE SURGERY     left  . ROTATOR CUFF REPAIR Left   . thumb surgery     left     reports that she has been smoking cigarettes.  She has a 25.00 pack-year smoking history. She has never used smokeless tobacco. She reports that she does not drink alcohol or use drugs.  Allergies  Allergen Reactions  . Ciprofloxacin Hives    Can take levaquin with no problems per patient and family  . Macrobid [Nitrofurantoin] Nausea And Vomiting  . Azithromycin Itching and Swelling  . Sulfa Antibiotics Itching    Family History  Problem Relation Age of Onset  . COPD Father     Prior to Admission medications   Medication Sig Start Date End Date Taking? Authorizing Provider  albuterol (PROVENTIL HFA;VENTOLIN HFA) 108 (90 Base) MCG/ACT inhaler Inhale 2 puffs into the lungs every  6 (six) hours as needed for wheezing or shortness of breath.   Yes [provider]  aspirin 325 MG tablet Take 325 mg by mouth daily.   Yes [provider]  buPROPion (WELLBUTRIN XL) 300 MG 24 hr tablet Take 300 mg by mouth daily.  02/10/15  Yes [provider]  cyclobenzaprine (FLEXERIL) 5 MG tablet Take 5 mg by mouth 3 (three) times daily as needed for muscle spasms.    Yes [provider]  diazepam (VALIUM) 5 MG tablet Take 5 mg by mouth every 8 (eight) hours as needed for anxiety or muscle spasms.  03/24/15  Yes [provider]  Flax Oil-Fish Oil-Borage Oil CAPS Take 1 capsule by mouth daily.   Yes [provider]  Fluticasone-Salmeterol (ADVAIR DISKUS) 250-50 MCG/DOSE AEPB Inhale 1 puff into the lungs daily as needed (shortness of breath).  09/17/17  Yes [provider]  HYDROcodone-acetaminophen (NORCO/VICODIN) 5-325  MG tablet Take 1 tablet by mouth every 4 (four) hours as needed for moderate pain. 11/28/17  Yes McKenzie, Candee Furbish, MD  levothyroxine (SYNTHROID, LEVOTHROID) 100 MCG tablet Take 100 mcg by mouth daily. Take with 11mcg tablet for total dose of 266mcg daily   Yes [provider]  levothyroxine (SYNTHROID, LEVOTHROID) 175 MCG tablet Take 175 mcg by mouth daily. Take one tablet with 181mcg tablet for total dose of 264mcg   Yes [provider]  Multiple Vitamin (MULTIVITAMIN WITH MINERALS) TABS Take 1 tablet by mouth daily.   Yes [provider]  nortriptyline (PAMELOR) 50 MG capsule Take 100 mg by mouth at bedtime.   Yes [provider]  potassium chloride SA (KLOR-CON M20) 20 MEQ tablet Take 40 mEq by mouth 3 (three) times daily.    Yes [provider]  topiramate (TOPAMAX) 100 MG tablet Take 200 mg by mouth at bedtime.   Yes [provider]  torsemide (DEMADEX) 20 MG tablet Take 10 mg by mouth 2 (two) times daily as needed (fluid).  09/17/17  Yes [provider]      Physical Exam: Vitals:   05/22/18 1911 05/22/18 1919 05/22/18 2043  BP:  (!) 123/98 (!) 98/46  Pulse:  (!) 101 95  Resp:  (!) 26 20  Temp:  (!) 103.2 F (39.6 C)   TempSrc:  Rectal   SpO2:  92% 96%  Weight: 91.6 kg (202 lb)    Height: 5\' 5"  (1.651 m)      Constitutional: NAD, calm, comfortable Eyes: PERRL, lids and conjunctivae normal ENMT: Mucous membranes and lips are dry. Posterior pharynx clear of any exudate or lesions. Neck: normal, supple, no masses, no thyromegaly Respiratory: Decreased breath sounds on right base, mild bilateral rhonchi, no wheezing, no crackles. Normal respiratory effort. No accessory muscle use.  Cardiovascular: Regular rate and rhythm, no murmurs / rubs / gallops. No extremity edema. 2+ pedal pulses. No carotid bruits.  Abdomen: Obese.  Soft, no tenderness, no CVA tenderness, no masses palpated. No hepatosplenomegaly. Bowel sounds positive.  Musculoskeletal: no clubbing / cyanosis. Good ROM, no contractures. Normal muscle tone.  Skin: no rashes, lesions, ulcers on limited dermatological examination. Neurologic: CN 2-12 grossly intact. Sensation intact, DTR normal. Strength 5/5 in all 4.  Psychiatric: Normal judgment and insight. Alert and oriented x person, place and situation.  Slow to respond, but oriented to time.  Normal mood.   Labs on Admission: I have personally reviewed following labs and imaging studies  CBC: Recent Labs  Lab 05/22/18 1945  WBC 12.9*  NEUTROABS 11.1*  HGB 13.1  HCT 39.8  MCV 95.0  PLT 263   Basic Metabolic Panel: Recent Labs  Lab 05/22/18 1945  NA 137  K 4.1  CL 110  CO2 19*  GLUCOSE 143*  BUN 16  CREATININE 1.19*  CALCIUM 8.5*   GFR: Estimated Creatinine Clearance: 50.6 mL/min (A) (by C-G formula based on SCr of 1.19 mg/dL (H)). Liver Function Tests: Recent Labs  Lab 05/22/18 1945  AST 13*  ALT 15  ALKPHOS 92  BILITOT 0.7  PROT 6.9  ALBUMIN 3.1*   No results for input(s): LIPASE, AMYLASE in  the last 168 hours. No results for input(s): AMMONIA in the last 168 hours. Coagulation Profile: No results for input(s): INR, PROTIME in the last 168 hours. Cardiac Enzymes: Recent Labs  Lab 05/22/18 1945  TROPONINI <0.03   BNP (last 3 results) No results for input(s): PROBNP in the last 8760  hours. HbA1C: No results for input(s): HGBA1C in the last 72 hours. CBG: Recent Labs  Lab 05/22/18 1938 05/22/18 2159  GLUCAP 137* 145*   Lipid Profile: No results for input(s): CHOL, HDL, LDLCALC, TRIG, CHOLHDL, LDLDIRECT in the last 72 hours. Thyroid Function Tests: No results for input(s): TSH, T4TOTAL, FREET4, T3FREE, THYROIDAB in the last 72 hours. Anemia Panel: No results for input(s): VITAMINB12, FOLATE, FERRITIN, TIBC, IRON, RETICCTPCT in the last 72 hours. Urine analysis:    Component Value Date/Time   COLORURINE YELLOW 05/22/2018 1945   APPEARANCEUR CLOUDY (A) 05/22/2018 1945   LABSPEC 1.014 05/22/2018 1945   PHURINE 5.0 05/22/2018 1945   GLUCOSEU NEGATIVE 05/22/2018 1945   HGBUR SMALL (A) 05/22/2018 1945   BILIRUBINUR NEGATIVE 05/22/2018 1945   KETONESUR NEGATIVE 05/22/2018 1945   PROTEINUR 30 (A) 05/22/2018 1945   NITRITE POSITIVE (A) 05/22/2018 1945   LEUKOCYTESUR LARGE (A) 05/22/2018 1945    Radiological Exams on Admission: Dg Chest 2 View  Result Date: 05/22/2018 CLINICAL DATA:  67 y/o  F; fever. EXAM: CHEST - 2 VIEW COMPARISON:  09/30/2017 chest radiograph FINDINGS: Normal cardiac silhouette. Aortic atherosclerosis with calcification. Elevated right hemidiaphragm with colon superposition over the liver. Bibasilar opacity may represent pneumonia or atelectasis. No acute osseous abnormality is evident. IMPRESSION: 1. Right basilar opacity may represent pneumonia or atelectasis. 2. Stable elevated right hemidiaphragm and colon superposition over the liver. 3. Aortic atherosclerosis. Electronically Signed   By: Kristine Garbe M.D.   On: 05/22/2018 20:45     EKG: Independently reviewed. Vent. rate 100 BPM PR interval * ms QRS duration 101 ms QT/QTc 351/453 ms P-R-T axes 89 63 62 Sinus tachycardia Borderline prolonged PR interval Borderline low voltage, extremity leads Baseline wander  Assessment/Plan Principal Problem:   Sepsis secondary to UTI ([place]) Admit to/inpatient. Continue IV fluids. Continue cefepime and vancomycin per pharmacy. Follow-up blood culture and sensitivity. Follow-up urine culture and sensitivity. Given history of previous urolithiasis and hydronephrosis, check renal ultrasound in the morning.  Active Problems:   HCAP (healthcare associated pneumonia) Continue supplemental oxygen. Continue Advair or formulary equivalent. Albuterol every 6 hours as needed. Vancomycin and cefepime per pharmacy. Check sputum culture and sensitivity. Follow-up blood culture and sensitivity.    GERD (gastroesophageal reflux disease) Protonix 40 mg p.o. daily.    Hypothyroidism Continue levothyroxine 275 mcg p.o. Daily. Check TSH as needed    Type 2 diabetes mellitus (HCC) Carbohydrate modified diet. CBG monitoring with regular insulin sliding scale.    Anxiety and depression Continue Wellbutrin 300 mg p.o. daily. Continue nortriptyline 100 mg p.o. at bedtime.    Chronic back pain Continue Norco as needed, once the patient is less confused.   DVT prophylaxis: Heparin SQ. Code Status: Full code. Family Communication:  Disposition Plan: Admit for IV antibiotics for 2 to 3 days. Consults called: Admission status: Inpatient/telemetry.   Reubin Milan MD [facility] Pager [phone].  If 7PM-7AM, please contact night-coverage [email or web address] Password [email or web address]  05/22/2018, 10:02 PM

## 2018-05-23 ENCOUNTER — Other Ambulatory Visit: Payer: Self-pay

## 2018-05-23 DIAGNOSIS — A419 Sepsis, unspecified organism: Secondary | ICD-10-CM

## 2018-05-23 DIAGNOSIS — N390 Urinary tract infection, site not specified: Secondary | ICD-10-CM

## 2018-05-23 LAB — BLOOD CULTURE ID PANEL (REFLEXED)
Acinetobacter baumannii: NOT DETECTED
CANDIDA ALBICANS: NOT DETECTED
CANDIDA KRUSEI: NOT DETECTED
CANDIDA PARAPSILOSIS: NOT DETECTED
Candida glabrata: NOT DETECTED
Candida tropicalis: NOT DETECTED
Carbapenem resistance: NOT DETECTED
ENTEROBACTER CLOACAE COMPLEX: NOT DETECTED
Enterobacteriaceae species: DETECTED — AB
Enterococcus species: NOT DETECTED
Escherichia coli: DETECTED — AB
Haemophilus influenzae: NOT DETECTED
KLEBSIELLA PNEUMONIAE: NOT DETECTED
Klebsiella oxytoca: NOT DETECTED
Listeria monocytogenes: NOT DETECTED
Neisseria meningitidis: NOT DETECTED
Proteus species: NOT DETECTED
Pseudomonas aeruginosa: NOT DETECTED
Serratia marcescens: NOT DETECTED
Staphylococcus aureus (BCID): NOT DETECTED
Staphylococcus species: NOT DETECTED
Streptococcus agalactiae: NOT DETECTED
Streptococcus pneumoniae: NOT DETECTED
Streptococcus pyogenes: NOT DETECTED
Streptococcus species: NOT DETECTED

## 2018-05-23 LAB — GLUCOSE, CAPILLARY
GLUCOSE-CAPILLARY: 104 mg/dL — AB (ref 70–99)
GLUCOSE-CAPILLARY: 72 mg/dL (ref 70–99)
Glucose-Capillary: 91 mg/dL (ref 70–99)
Glucose-Capillary: 130 mg/dL — ABNORMAL HIGH (ref 70–99)
Glucose-Capillary: 104 mg/dL — ABNORMAL HIGH (ref 70–99)
Glucose-Capillary: 69 mg/dL — ABNORMAL LOW (ref 70–99)

## 2018-05-23 LAB — CBC WITH DIFFERENTIAL/PLATELET
BASOS PCT: 0 %
Basophils Absolute: 0 10*3/uL (ref 0.0–0.1)
EOS ABS: 0 10*3/uL (ref 0.0–0.7)
EOS PCT: 0 %
HCT: 38.9 % (ref 36.0–46.0)
HEMOGLOBIN: 12.3 g/dL (ref 12.0–15.0)
Lymphocytes Relative: 12 %
Lymphs Abs: 1.4 10*3/uL (ref 0.7–4.0)
MCH: 30.5 pg (ref 26.0–34.0)
MCHC: 31.6 g/dL (ref 30.0–36.0)
MCV: 96.5 fL (ref 78.0–100.0)
MONOS PCT: 9 %
Monocytes Absolute: 1 10*3/uL (ref 0.1–1.0)
NEUTROS PCT: 79 %
Neutro Abs: 9.2 10*3/uL (ref 1.7–7.7)
PLATELETS: 174 10*3/uL (ref 150–400)
RBC: 4.03 MIL/uL (ref 3.87–5.11)
RDW: 13.4 % (ref 11.5–15.5)
WBC: 11.7 10*3/uL — ABNORMAL HIGH (ref 4.0–10.5)

## 2018-05-23 LAB — COMPREHENSIVE METABOLIC PANEL
ALBUMIN: 2.7 g/dL — AB (ref 3.5–5.0)
ALK PHOS: 81 U/L (ref 38–126)
ALT: 12 U/L (ref 0–44)
AST: 11 U/L — ABNORMAL LOW (ref 15–41)
Anion gap: 6 (ref 5–15)
BUN: 16 mg/dL (ref 8–23)
CHLORIDE: 114 mmol/L — AB (ref 98–111)
CO2: 19 mmol/L — ABNORMAL LOW (ref 22–32)
Calcium: 8.2 mg/dL — ABNORMAL LOW (ref 8.9–10.3)
Creatinine, Ser: 1.19 mg/dL — ABNORMAL HIGH (ref 0.44–1.00)
GFR calc Af Amer: 53 mL/min — ABNORMAL LOW (ref >60)
GFR calc non Af Amer: 46 mL/min — ABNORMAL LOW (ref >60)
GLUCOSE: 102 mg/dL — AB (ref 70–99)
POTASSIUM: 3.9 mmol/L (ref 3.5–5.1)
SODIUM: 139 mmol/L (ref 135–145)
Total Bilirubin: 0.6 mg/dL (ref 0.3–1.2)
Total Protein: 6 g/dL — ABNORMAL LOW (ref 6.5–8.1)

## 2018-05-23 LAB — MRSA PCR SCREENING: MRSA by PCR: NEGATIVE

## 2018-05-23 LAB — STREP PNEUMONIAE URINARY ANTIGEN: Strep Pneumo Urinary Antigen: NEGATIVE

## 2018-05-23 MED ORDER — VANCOMYCIN HCL 10 G IV SOLR
2000.0000 mg | Freq: Once | INTRAVENOUS | Status: AC
  Administered 2018-05-23: 2000 mg via INTRAVENOUS
  Filled 2018-05-23: qty 2000

## 2018-05-23 MED ORDER — NORTRIPTYLINE HCL 25 MG PO CAPS
100.0000 mg | ORAL_CAPSULE | Freq: Every day | ORAL | Status: DC
  Administered 2018-05-23 – 2018-05-24 (×3): 100 mg via ORAL
  Filled 2018-05-23 (×3): qty 4

## 2018-05-23 MED ORDER — CYCLOBENZAPRINE HCL 10 MG PO TABS
5.0000 mg | ORAL_TABLET | Freq: Three times a day (TID) | ORAL | Status: DC | PRN
  Administered 2018-05-23: 5 mg via ORAL
  Administered 2018-05-23: 16:00:00 via ORAL
  Administered 2018-05-24: 5 mg via ORAL
  Filled 2018-05-23 (×3): qty 1

## 2018-05-23 MED ORDER — ASPIRIN 325 MG PO TABS
325.0000 mg | ORAL_TABLET | Freq: Every day | ORAL | Status: DC
  Administered 2018-05-23 – 2018-05-25 (×3): 325 mg via ORAL
  Filled 2018-05-23 (×3): qty 1

## 2018-05-23 MED ORDER — ENSURE ENLIVE PO LIQD: 237.0000 mL | Freq: Two times a day (BID) | ORAL | Status: DC

## 2018-05-23 MED ORDER — SODIUM CHLORIDE 0.9 % IV SOLN
2.0000 g | Freq: Three times a day (TID) | INTRAVENOUS | Status: DC
  Administered 2018-05-23 – 2018-05-25 (×7): 2 g via INTRAVENOUS
  Filled 2018-05-23 (×15): qty 2

## 2018-05-23 MED ORDER — LEVOTHYROXINE SODIUM 50 MCG PO TABS
175.0000 ug | ORAL_TABLET | Freq: Every day | ORAL | Status: DC
  Administered 2018-05-23 – 2018-05-25 (×3): 175 ug via ORAL
  Filled 2018-05-23 (×3): qty 4

## 2018-05-23 MED ORDER — BUPROPION HCL ER (XL) 300 MG PO TB24
300.0000 mg | ORAL_TABLET | Freq: Every day | ORAL | Status: DC
  Administered 2018-05-23 – 2018-05-25 (×3): 300 mg via ORAL
  Filled 2018-05-23 (×3): qty 1

## 2018-05-23 MED ORDER — DIAZEPAM 5 MG PO TABS: 5.0000 mg | ORAL_TABLET | Freq: Three times a day (TID) | ORAL | Status: DC | PRN

## 2018-05-23 MED ORDER — LEVOTHYROXINE SODIUM 50 MCG PO TABS
100.0000 ug | ORAL_TABLET | Freq: Every day | ORAL | Status: DC
  Administered 2018-05-23 – 2018-05-25 (×3): 100 ug via ORAL
  Filled 2018-05-23 (×3): qty 2

## 2018-05-23 MED ORDER — VANCOMYCIN HCL IN DEXTROSE 1-5 GM/200ML-% IV SOLN: 1000.0000 mg | Freq: Once | INTRAVENOUS | Status: DC

## 2018-05-23 MED ORDER — TOPIRAMATE 100 MG PO TABS
200.0000 mg | ORAL_TABLET | Freq: Every day | ORAL | Status: DC
  Administered 2018-05-23 – 2018-05-24 (×3): 200 mg via ORAL
  Filled 2018-05-23: qty 8
  Filled 2018-05-23: qty 2

## 2018-05-23 MED ORDER — GLUCOSE 40 % PO GEL
ORAL | Status: AC
  Administered 2018-05-23: 22:00:00
  Filled 2018-05-23: qty 1

## 2018-05-23 MED ORDER — ADULT MULTIVITAMIN W/MINERALS CH
1.0000 | ORAL_TABLET | Freq: Every day | ORAL | Status: DC
  Administered 2018-05-23 – 2018-05-25 (×3): 1 via ORAL
  Filled 2018-05-23 (×3): qty 1

## 2018-05-23 MED ORDER — VANCOMYCIN HCL IN DEXTROSE 750-5 MG/150ML-% IV SOLN
750.0000 mg | Freq: Two times a day (BID) | INTRAVENOUS | Status: DC
  Filled 2018-05-23 (×2): qty 150

## 2018-05-23 MED ORDER — TOPIRAMATE 25 MG PO TABS
ORAL_TABLET | ORAL | Status: AC
  Filled 2018-05-23: qty 5

## 2018-05-23 MED ORDER — ALBUTEROL SULFATE (2.5 MG/3ML) 0.083% IN NEBU
2.5000 mg | INHALATION_SOLUTION | Freq: Four times a day (QID) | RESPIRATORY_TRACT | Status: DC
  Administered 2018-05-23 – 2018-05-25 (×8): 2.5 mg via RESPIRATORY_TRACT
  Filled 2018-05-23 (×8): qty 3

## 2018-05-23 MED ORDER — MOMETASONE FURO-FORMOTEROL FUM 200-5 MCG/ACT IN AERO
2.0000 | INHALATION_SPRAY | Freq: Two times a day (BID) | RESPIRATORY_TRACT | Status: DC
  Administered 2018-05-23 – 2018-05-25 (×5): 2 via RESPIRATORY_TRACT
  Filled 2018-05-23 (×2): qty 8.8

## 2018-05-23 NOTE — Progress Notes (Signed)
Pharmacy Antibiotic Note  Courtney Orozco is a 67 y.o. female admitted on 05/22/2018 with pneumonia.  Pharmacy has been consulted for cefepime and Vancomycin dosing.  Plan: Vancomycin 1gm given in ED, will load with 2000mg  this AM, then 750mg  IV q12h Goal trough 15-32mcg/ml cefepime 2gm iv q8h  F/u cxs and clinical progress Monitor V/S, labs, and levels as indicated  Height: 5\' 5"  (165.1 cm) Weight: 210 lb 8.6 oz (95.5 kg) IBW/kg (Calculated) : 57  Temp (24hrs), Avg:100.7 F (38.2 C), Min:98.9 F (37.2 C), Max:103.2 F (39.6 C)  Recent Labs  Lab 05/22/18 1945 05/22/18 1956 05/23/18 0405  WBC 12.9*  --  11.7*  CREATININE 1.19*  --  1.19*  LATICACIDVEN  --  0.98  --     Estimated Creatinine Clearance: 51.7 mL/min (A) (by C-G formula based on SCr of 1.19 mg/dL (H)).    Allergies  Allergen Reactions  . Ciprofloxacin Hives    Can take levaquin with no problems per patient and family  . Macrobid [Nitrofurantoin] Nausea And Vomiting  . Azithromycin Itching and Swelling  . Sulfa Antibiotics Itching   Antimicrobials this admission: 07/31 cefepime >> 7/31 zosyn >> 7/31 7/31 vancomycin >>   Microbiology results: 7/31 BCx: pending 7/31 UCx: pending 8/1 MRSA PCR: pending   Thank you for allowing pharmacy to be a part of this patient's care. Isac Sarna, BS Pharm D, [place] Clinical Pharmacist Pager [phone] 05/23/2018 8:36 AM

## 2018-05-23 NOTE — Progress Notes (Addendum)
PROGRESS NOTE    Courtney Orozco  ENI:[id number] DOB: 01-31-1951 DOA: 05/22/2018 PCP: Galen Manila, MD   Brief Narrative:    Courtney Orozco is a 67 y.o. female with medical history significant of anxiety, depression, osteoarthritis, chronic back pain, type 2 diabetes without complication, history of TIA, history of migraine headaches, history of gait instability, GERD, history of hepatitis B decades ago, urolithiasis, hypertension, hypothyroidism, history of pneumonia, history of TIA history of memory loss who is brought via EMS to the emergency department after she was taken to her PCP today by family members due to confusion, likely secondary to UTI.  When EMS evaluated the patient she was febrile with a temperature of 103 F and hypoxic with an O2 sat of 56% on room air.  She has been admitted with pneumonia as well as UTI.  Assessment & Plan:   Principal Problem:   Sepsis secondary to UTI [facility]) Active Problems:   GERD (gastroesophageal reflux disease)   Hypothyroidism   Type 2 diabetes mellitus (HCC)   Anxiety and depression   HCAP (healthcare-associated pneumonia)   Chronic back pain  1. Acute encephalopathy secondary to sepsis with UTI and pneumonia.  Continue vancomycin and cefepime per pharmacy dosing with MRSA PCR ordered.  Cultures pending.  Continue IV fluid at lower dose through today and repeat lactic in a.m. 2. Acute hypoxemic respiratory failure 2/2 pneumonia. Wean oxygen as tolerated. 3. GERD.  PPI daily.  4. Hypothyroidism.  Continue levothyroxine. 5. Type 2 diabetes.  Continue sliding scale insulin with carb modified diet.  Currently well controlled. 6. Anxiety and depression.  Continue Wellbutrin and nortriptyline. 7. Chronic back pain.  Continue Norco as needed.   DVT prophylaxis:Heparin Code Status: Full Family Communication: Husband at bedside Disposition Plan: Continue IV antibiotics and follow cultures with de-escalation pending clinical  improvement   Consultants:   None  Procedures:   None  Antimicrobials:   Vancomycin and Cefepime 7/31->   Subjective: Patient seen and evaluated today with no new acute complaints or concerns. No acute concerns or events noted overnight. She states that she is overall feeling better today.  Objective: Vitals:   05/23/18 0058 05/23/18 0650 05/23/18 0738 05/23/18 0807  BP: (!) 98/42 (!) 151/59    Pulse: 82 (!) 111    Resp: 20     Temp: 98.9 F (37.2 C) (!) 101.9 F (38.8 C)  99.2 F (37.3 C)  TempSrc: Oral Oral  Oral  SpO2: 100% 98% 92%   Weight: 95.5 kg (210 lb 8.6 oz)     Height:        Intake/Output Summary (Last 24 hours) at 05/23/2018 1115 Last data filed at 05/23/2018 0856 Gross per 24 hour  Intake 1606.67 ml  Output 400 ml  Net 1206.67 ml   Filed Weights   05/22/18 1911 05/23/18 0058  Weight: 91.6 kg (202 lb) 95.5 kg (210 lb 8.6 oz)    Examination:  General exam: Appears calm and comfortable  Respiratory system: Clear to auscultation. Respiratory effort normal. On [place]. Cardiovascular system: S1 & S2 heard, RRR. No JVD, murmurs, rubs, gallops or clicks. No pedal edema. Gastrointestinal system: Abdomen is nondistended, soft and nontender. No organomegaly or masses felt. Normal bowel sounds heard. Central nervous system: Alert and oriented. No focal neurological deficits. Extremities: Symmetric 5 x 5 power. Skin: No rashes, lesions or ulcers    Data Reviewed: I have personally reviewed following labs and imaging studies  CBC: Recent Labs  Lab 05/22/18 1945  05/23/18 0405  WBC 12.9* 11.7*  NEUTROABS 11.1* 9.2  HGB 13.1 12.3  HCT 39.8 38.9  MCV 95.0 96.5  PLT 227 867   Basic Metabolic Panel: Recent Labs  Lab 05/22/18 1945 05/23/18 0405  NA 137 139  K 4.1 3.9  CL 110 114*  CO2 19* 19*  GLUCOSE 143* 102*  BUN 16 16  CREATININE 1.19* 1.19*  CALCIUM 8.5* 8.2*   GFR: Estimated Creatinine Clearance: 51.7 mL/min (A) (by C-G formula based on  SCr of 1.19 mg/dL (H)). Liver Function Tests: Recent Labs  Lab 05/22/18 1945 05/23/18 0405  AST 13* 11*  ALT 15 12  ALKPHOS 92 81  BILITOT 0.7 0.6  PROT 6.9 6.0*  ALBUMIN 3.1* 2.7*   No results for input(s): LIPASE, AMYLASE in the last 168 hours. No results for input(s): AMMONIA in the last 168 hours. Coagulation Profile: No results for input(s): INR, PROTIME in the last 168 hours. Cardiac Enzymes: Recent Labs  Lab 05/22/18 1945  TROPONINI <0.03   BNP (last 3 results) No results for input(s): PROBNP in the last 8760 hours. HbA1C: No results for input(s): HGBA1C in the last 72 hours. CBG: Recent Labs  Lab 05/22/18 1938 05/22/18 2159 05/23/18 0109 05/23/18 0747 05/23/18 1101  GLUCAP 137* 145* 91 104* 130*   Lipid Profile: No results for input(s): CHOL, HDL, LDLCALC, TRIG, CHOLHDL, LDLDIRECT in the last 72 hours. Thyroid Function Tests: No results for input(s): TSH, T4TOTAL, FREET4, T3FREE, THYROIDAB in the last 72 hours. Anemia Panel: No results for input(s): VITAMINB12, FOLATE, FERRITIN, TIBC, IRON, RETICCTPCT in the last 72 hours. Sepsis Labs: Recent Labs  Lab 05/22/18 1956  LATICACIDVEN 0.98    Recent Results (from the past 240 hour(s))  Blood Culture (routine x 2)     Status: None (Preliminary result)   Collection Time: 05/22/18  7:45 PM  Result Value Ref Range Status   Specimen Description BLOOD RIGHT ARM RN BLC DRAW  Final   Special Requests   Final    BOTTLES DRAWN AEROBIC AND ANAEROBIC Blood Culture adequate volume   Culture   Final    NO GROWTH < 12 HOURS Performed at [facility], [address] [place], [place] [address]    Report Status PENDING  Incomplete  Blood Culture (routine x 2)     Status: None (Preliminary result)   Collection Time: 05/22/18  7:56 PM  Result Value Ref Range Status   Specimen Description BLOOD RIGHT WRIST  Final   Special Requests   Final    BOTTLES DRAWN AEROBIC AND ANAEROBIC Blood Culture adequate volume   Culture    Final    NO GROWTH < 12 HOURS Performed at [facility], [address], [place], [place] [address]    Report Status PENDING  Incomplete  MRSA PCR Screening     Status: None   Collection Time: 05/23/18  8:10 AM  Result Value Ref Range Status   MRSA by PCR NEGATIVE NEGATIVE Final    Comment:        The GeneXpert MRSA Assay (FDA approved for NASAL specimens only), is one component of a comprehensive MRSA colonization surveillance program. It is not intended to diagnose MRSA infection nor to guide or monitor treatment for MRSA infections. Performed at [facility], [address] [place], [place] [address]        Radiology Studies: Dg Chest 2 View  Result Date: 05/22/2018 CLINICAL DATA:  67 y/o  F; fever. EXAM: CHEST - 2 VIEW COMPARISON:  09/30/2017 chest radiograph FINDINGS: Normal cardiac silhouette. Aortic atherosclerosis with calcification. Elevated right hemidiaphragm with colon superposition over the liver. Bibasilar opacity may represent pneumonia or atelectasis. No acute osseous abnormality is evident. IMPRESSION: 1. Right basilar opacity may represent pneumonia or atelectasis. 2. Stable elevated right hemidiaphragm and colon superposition over the liver. 3. Aortic atherosclerosis. Electronically Signed   By: Kristine Garbe M.D.   On: 05/22/2018 20:45      Scheduled Meds: . aspirin  325 mg Oral Daily  . buPROPion  300 mg Oral Daily  . feeding supplement (ENSURE ENLIVE)  237 mL Oral BID BM  . heparin  5,000 Units Subcutaneous Q8H  . insulin aspart  0-9 Units Subcutaneous TID WC  . levothyroxine  100 mcg Oral QAC breakfast  . levothyroxine  175 mcg Oral QAC breakfast  . mometasone-formoterol  2 puff Inhalation BID  . multivitamin with minerals  1 tablet Oral Daily  . nortriptyline  100 mg Oral QHS  . topiramate  200 mg Oral QHS   Continuous Infusions: . sodium chloride 125 mL/hr at 05/23/18 0104  . ceFEPime (MAXIPIME) IV Stopped (05/23/18 0534)  .  vancomycin       LOS: 1 day    Time spent: 30 minutes    Azhar Knope Darleen Crocker, DO [facility] Pager [phone]  If 7PM-7AM, please contact night-coverage [email or web address] Password [email or web address] 05/23/2018, 11:15 AM

## 2018-05-23 NOTE — Progress Notes (Signed)
Pharmacy Antibiotic Note  MIKEALA GIRDLER is a 67 y.o. female admitted on 05/22/2018 with pneumonia.  Pharmacy has been consulted for cefepime dosing.  Plan: cefepime 2gm iv q8h  Height: 5\' 5"  (165.1 cm) Weight: 202 lb (91.6 kg) IBW/kg (Calculated) : 57  Temp (24hrs), Avg:100.8 F (38.2 C), Min:98.9 F (37.2 C), Max:103.2 F (39.6 C)  Recent Labs  Lab 05/22/18 1945 05/22/18 1956  WBC 12.9*  --   CREATININE 1.19*  --   LATICACIDVEN  --  0.98    Estimated Creatinine Clearance: 50.6 mL/min (A) (by C-G formula based on SCr of 1.19 mg/dL (H)).    Allergies  Allergen Reactions  . Ciprofloxacin Hives    Can take levaquin with no problems per patient and family  . Macrobid [Nitrofurantoin] Nausea And Vomiting  . Azithromycin Itching and Swelling  . Sulfa Antibiotics Itching    Antimicrobials this admission: 07/31 cefepime >> 7/31 zosyn >>  7/31 vancomycin >>   Microbiology results: 7/31 BCx:  7/31 UCx:      Thank you for allowing pharmacy to be a part of this patient's care.  Donna Christen Dhara Schepp 05/23/2018 1:03 AM

## 2018-05-23 NOTE — ED Notes (Signed)
Pt bp has increased to 118/45 dr.ortiz notified and gave okay to send upstairs to 310.

## 2018-05-23 NOTE — Progress Notes (Signed)
Nutrition Brief Note  Patient identified on the Malnutrition Screening Tool (MST) Report.  Patient  Is a 67 yo female who presents hypoxic with a fever of 103 degrees farenheit. She  Has a hx of TIA, Migraines,GERD, DM-2 and HTN. Patient is being treated for pneumonia and UTI.  Current diet order is Heart Healthy/Cho modified, patient ate 75% of breakfast this morning. . She is able to feed herself and says her home diet is regular.   Weight hx noted below. Patients reports no weight changes. Patient says her MD wants her to maintain weight between 200-210 lb range.  Wt Readings from Last 15 Encounters:  05/23/18 210 lb 8.6 oz (95.5 kg)  11/27/17 200 lb (90.7 kg)  10/03/17 204 lb 3.2 oz (92.6 kg)  09/29/17 215 lb 9.8 oz (97.8 kg)  04/24/17 212 lb (96.2 kg)  01/22/17 210 lb (95.3 kg)  04/06/15 212 lb (96.2 kg)  03/14/15 210 lb (95.3 kg)  06/08/12 200 lb (90.7 kg)   Body mass index is 35.04 kg/m. Patient meets criteria for obese class II based on current BMI.   Labs and medications reviewed. Her A1C- 5.9% in 11/27/17 BMP Latest Ref Rng & Units 05/23/2018 05/22/2018 10/02/2017  Glucose 70 - 99 mg/dL 102(H) 143(H) 119(H)  BUN 8 - 23 mg/dL 16 16 16   Creatinine 0.44 - 1.00 mg/dL 1.19(H) 1.19(H) 1.02(H)  Sodium 135 - 145 mmol/L 139 137 137  Potassium 3.5 - 5.1 mmol/L 3.9 4.1 3.9  Chloride 98 - 111 mmol/L 114(H) 110 110  CO2 22 - 32 mmol/L 19(L) 19(L) 20(L)  Calcium 8.9 - 10.3 mg/dL 8.2(L) 8.5(L) 8.8(L)    No nutrition interventions warranted at this time. If nutrition issues arise, please consult RD.   Colman Cater MS,RD,CSG,LDN Office: [phone] Pager: [phone]

## 2018-05-24 LAB — BASIC METABOLIC PANEL
Anion gap: 7 (ref 5–15)
BUN: 16 mg/dL (ref 8–23)
CHLORIDE: 117 mmol/L — AB (ref 98–111)
CO2: 17 mmol/L — AB (ref 22–32)
Calcium: 8.4 mg/dL — ABNORMAL LOW (ref 8.9–10.3)
Creatinine, Ser: 1.02 mg/dL — ABNORMAL HIGH (ref 0.44–1.00)
GFR calc Af Amer: >60 mL/min (ref >60)
GFR calc non Af Amer: 55 mL/min — ABNORMAL LOW (ref >60)
Glucose, Bld: 101 mg/dL — ABNORMAL HIGH (ref 70–99)
Potassium: 3.4 mmol/L — ABNORMAL LOW (ref 3.5–5.1)
Sodium: 141 mmol/L (ref 135–145)

## 2018-05-24 LAB — GLUCOSE, CAPILLARY
GLUCOSE-CAPILLARY: 72 mg/dL (ref 70–99)
Glucose-Capillary: 108 mg/dL — ABNORMAL HIGH (ref 70–99)
Glucose-Capillary: 106 mg/dL — ABNORMAL HIGH (ref 70–99)
Glucose-Capillary: 80 mg/dL (ref 70–99)

## 2018-05-24 LAB — LACTIC ACID, PLASMA: Lactic Acid, Venous: 0.6 mmol/L (ref 0.5–1.9)

## 2018-05-24 LAB — CBC WITH DIFFERENTIAL/PLATELET
BASOS PCT: 0 %
Basophils Absolute: 0 10*3/uL (ref 0.0–0.1)
Eosinophils Absolute: 0 10*3/uL (ref 0.0–0.7)
Eosinophils Relative: 0 %
HCT: 33.1 % — ABNORMAL LOW (ref 36.0–46.0)
Hemoglobin: 10.7 g/dL — ABNORMAL LOW (ref 12.0–15.0)
LYMPHS ABS: 1.2 10*3/uL (ref 0.7–4.0)
Lymphocytes Relative: 16 %
MCH: 30.5 pg (ref 26.0–34.0)
MCHC: 32.3 g/dL (ref 30.0–36.0)
MCV: 94.3 fL (ref 78.0–100.0)
MONOS PCT: 9 %
Monocytes Absolute: 0.6 10*3/uL (ref 0.1–1.0)
NEUTROS ABS: 5.4 10*3/uL (ref 1.7–7.7)
NEUTROS PCT: 75 %
Platelets: 188 10*3/uL (ref 150–400)
RBC: 3.51 MIL/uL — ABNORMAL LOW (ref 3.87–5.11)
RDW: 13.4 % (ref 11.5–15.5)
WBC: 7.2 10*3/uL (ref 4.0–10.5)

## 2018-05-24 LAB — HIV ANTIBODY (ROUTINE TESTING W REFLEX): HIV SCREEN 4TH GENERATION: NONREACTIVE

## 2018-05-24 MED ORDER — GUAIFENESIN-DM 100-10 MG/5ML PO SYRP
5.0000 mL | ORAL_SOLUTION | ORAL | Status: DC | PRN
  Administered 2018-05-24 – 2018-05-25 (×2): 5 mL via ORAL
  Filled 2018-05-24 (×2): qty 5

## 2018-05-24 MED ORDER — POTASSIUM CHLORIDE CRYS ER 20 MEQ PO TBCR
40.0000 meq | EXTENDED_RELEASE_TABLET | Freq: Once | ORAL | Status: AC
  Administered 2018-05-24: 40 meq via ORAL
  Filled 2018-05-24: qty 2

## 2018-05-24 NOTE — Care Management Note (Signed)
Case Management Note  Patient Details  Name: Courtney Orozco MRN: [id number] Date of Birth: 11-16-1950  Subjective/Objective:    Admitted with UTI, Ecoli bacteremia. Pt from home, lives with husband. Pt ind at baseline. Pt has all necessary DME pta. She has PCP, transportation and insurance with drug coverage.                  Action/Plan: DC home with home health. Patient has no preference. Vaughan Basta of [facility] notified and will obtain orders via Epic when available. Anticipate DC tomorrow.    Expected Discharge Date:       05/25/2018           Expected Discharge Plan:  [place]  In-House Referral:     Discharge planning Services  CM Consult  Post Acute Care Choice:  Home Health Choice offered to:  Patient  DME Arranged:    DME Agency:     HH Arranged:  RN, PT [place] Agency:  [place]  Status of Service:  Completed, signed off  If discussed at [place] of Stay Meetings, dates discussed:    Additional Comments:  Teyon Odette, Chauncey Reading, RN 05/24/2018, 11:40 AM

## 2018-05-24 NOTE — Progress Notes (Signed)
PROGRESS NOTE    Courtney Orozco  RCV:[id number] DOB: [birth year] DOA: 05/22/2018 PCP: Galen Manila, MD   Brief Narrative:   Courtney Orozco a 67 y.o.femalewith medical history significant of anxiety, depression, osteoarthritis, chronic back pain, type 2 diabetes without complication, history of TIA, history of migraine headaches, history of gait instability, GERD, history of hepatitis B decades ago, urolithiasis, hypertension, hypothyroidism, history of pneumonia, history of TIA history of memory loss who is brought via EMS to the emergency department after she was taken to her PCP today by family members due to confusion, likely secondary to UTI. When EMS evaluated the patient she was febrile with a temperature of 103 F and hypoxic with an O2 sat of 56% on room air.  She has been admitted with pneumonia as well as UTI.  She is noted to have a recurrence of E. coli bacteremia and is improving on cefepime with sensitivities pending.  She is more drowsy today and has received some Flexeril yesterday.    Assessment & Plan:   Principal Problem:   Sepsis secondary to UTI [facility]) Active Problems:   GERD (gastroesophageal reflux disease)   Hypothyroidism   Type 2 diabetes mellitus (HCC)   Anxiety and depression   HCAP (healthcare-associated pneumonia)   Chronic back pain   1. Acute encephalopathy secondary to sepsis with [facility] UTI and bacteremia along with PNA.  Continue cefepime per pharmacy dosing with MRSA PCR negative.    Lactic acidosis has improved.  Continue to monitor for sensitivities.  She is previously noted to be sensitive to Levaquin and had tolerated this well, therefore could contemplate discharge tomorrow with oral Levaquin. 2. Acute hypoxemic respiratory failure 2/2 pneumonia. Wean oxygen as tolerated. 3. GERD.  PPI daily.  4. Hypothyroidism.  Continue levothyroxine. 5. Type 2 diabetes.  Continue sliding scale insulin with carb modified diet.  Currently well  controlled. 6. Anxiety and depression.  Continue Wellbutrin and nortriptyline. 7. Chronic back pain.  Continue Norco as needed.   DVT prophylaxis:Heparin Code Status: Full Family Communication: Husband at bedside Disposition Plan: Continue IV antibiotics and follow cultures with de-escalation pending clinical improvement; anticipate DC in a.m. if improved.   Consultants:   None  Procedures:   None  Antimicrobials:   Vancomycin 7/31-8/1  Cefepime 7/31->    Subjective: Patient seen and evaluated today with no new acute complaints or concerns. No acute concerns or events noted overnight.  She is overall feeling better but remains somnolent at times.  She has a poor appetite overall.  Objective: Vitals:   05/24/18 0158 05/24/18 0630 05/24/18 0817 05/24/18 0819  BP:  (!) 130/57    Pulse:  95    Resp:  20    Temp:  99.4 F (37.4 C)    TempSrc:  Oral    SpO2: 91% 94% 93% 93%  Weight:      Height:        Intake/Output Summary (Last 24 hours) at 05/24/2018 1335 Last data filed at 05/24/2018 0757 Gross per 24 hour  Intake 2326.25 ml  Output 1100 ml  Net 1226.25 ml   Filed Weights   05/22/18 1911 05/23/18 0058  Weight: 91.6 kg (202 lb) 95.5 kg (210 lb 8.6 oz)    Examination:  General exam: Appears calm and comfortable; somnolent Respiratory system: Clear to auscultation. Respiratory effort normal. Cardiovascular system: S1 & S2 heard, RRR. No JVD, murmurs, rubs, gallops or clicks. No pedal edema. Gastrointestinal system: Abdomen is nondistended, soft and nontender. No  organomegaly or masses felt. Normal bowel sounds heard. Central nervous system: Alert and oriented. No focal neurological deficits. Extremities: Symmetric 5 x 5 power. Skin: No rashes, lesions or ulcers     Data Reviewed: I have personally reviewed following labs and imaging studies  CBC: Recent Labs  Lab 05/22/18 1945 05/23/18 0405 05/24/18 0518  WBC 12.9* 11.7* 7.2  NEUTROABS 11.1*  9.2 5.4  HGB 13.1 12.3 10.7*  HCT 39.8 38.9 33.1*  MCV 95.0 96.5 94.3  PLT 227 174 761   Basic Metabolic Panel: Recent Labs  Lab 05/22/18 1945 05/23/18 0405 05/24/18 0518  NA 137 139 141  K 4.1 3.9 3.4*  CL 110 114* 117*  CO2 19* 19* 17*  GLUCOSE 143* 102* 101*  BUN 16 16 16   CREATININE 1.19* 1.19* 1.02*  CALCIUM 8.5* 8.2* 8.4*   GFR: Estimated Creatinine Clearance: 60.3 mL/min (A) (by C-G formula based on SCr of 1.02 mg/dL (H)). Liver Function Tests: Recent Labs  Lab 05/22/18 1945 05/23/18 0405  AST 13* 11*  ALT 15 12  ALKPHOS 92 81  BILITOT 0.7 0.6  PROT 6.9 6.0*  ALBUMIN 3.1* 2.7*   No results for input(s): LIPASE, AMYLASE in the last 168 hours. No results for input(s): AMMONIA in the last 168 hours. Coagulation Profile: No results for input(s): INR, PROTIME in the last 168 hours. Cardiac Enzymes: Recent Labs  Lab 05/22/18 1945  TROPONINI <0.03   BNP (last 3 results) No results for input(s): PROBNP in the last 8760 hours. HbA1C: No results for input(s): HGBA1C in the last 72 hours. CBG: Recent Labs  Lab 05/23/18 1630 05/23/18 2143 05/23/18 2221 05/24/18 0825 05/24/18 1152  GLUCAP 104* 69* 72 108* 106*   Lipid Profile: No results for input(s): CHOL, HDL, LDLCALC, TRIG, CHOLHDL, LDLDIRECT in the last 72 hours. Thyroid Function Tests: No results for input(s): TSH, T4TOTAL, FREET4, T3FREE, THYROIDAB in the last 72 hours. Anemia Panel: No results for input(s): VITAMINB12, FOLATE, FERRITIN, TIBC, IRON, RETICCTPCT in the last 72 hours. Sepsis Labs: Recent Labs  Lab 05/22/18 1956 05/24/18 0518  LATICACIDVEN 0.98 0.6    Recent Results (from the past 240 hour(s))  Blood Culture (routine x 2)     Status: Abnormal (Preliminary result)   Collection Time: 05/22/18  7:45 PM  Result Value Ref Range Status   Specimen Description   Final    BLOOD RIGHT ARM RN BLC DRAW Performed at [facility], [address], [place], [place] [address]    Special  Requests   Final    BOTTLES DRAWN AEROBIC AND ANAEROBIC Blood Culture adequate volume Performed at [facility], [address] [place], [place] [address]    Culture  Setup Time   Final    GRAM NEGATIVE RODS IN BOTH AEROBIC AND ANAEROBIC BOTTLES CRITICAL RESULT CALLED TO, READ BACK BY AND VERIFIED WITH: RN Marchelle Folks [phone] MLM    Culture (A)  Final    ESCHERICHIA COLI SUSCEPTIBILITIES TO FOLLOW Performed at [facility], [place] [address], [place], [place] [address]    Report Status PENDING  Incomplete  Urine culture     Status: Abnormal (Preliminary result)   Collection Time: 05/22/18  7:45 PM  Result Value Ref Range Status   Specimen Description   Final    URINE, RANDOM Performed at [facility] [address] [place], [place] [address]    Special Requests   Final    NONE Performed at [facility], [address], [place],  [place] [address]    Culture (A)  Final    >=100,000 COLONIES/mL ESCHERICHIA COLI SUSCEPTIBILITIES TO FOLLOW Performed at [place] [address], [place], [place] [address]    Report Status PENDING  Incomplete  Blood Culture ID Panel (Reflexed)     Status: Abnormal   Collection Time: 05/22/18  7:45 PM  Result Value Ref Range Status   Enterococcus species NOT DETECTED NOT DETECTED Final   Listeria monocytogenes NOT DETECTED NOT DETECTED Final   Staphylococcus species NOT DETECTED NOT DETECTED Final   Staphylococcus aureus NOT DETECTED NOT DETECTED Final   Streptococcus species NOT DETECTED NOT DETECTED Final   Streptococcus agalactiae NOT DETECTED NOT DETECTED Final   Streptococcus pneumoniae NOT DETECTED NOT DETECTED Final   Streptococcus pyogenes NOT DETECTED NOT DETECTED Final   Acinetobacter baumannii NOT DETECTED NOT DETECTED Final   Enterobacteriaceae species DETECTED (A) NOT DETECTED Final    Comment: Enterobacteriaceae represent a large family of gram-negative bacteria, not a single organism. CRITICAL RESULT CALLED TO,  READ BACK BY AND VERIFIED WITH: RN Marchelle Folks [phone] MLM    Enterobacter cloacae complex NOT DETECTED NOT DETECTED Final   Escherichia coli DETECTED (A) NOT DETECTED Final    Comment: CRITICAL RESULT CALLED TO, READ BACK BY AND VERIFIED WITH: RN Marchelle Folks [phone] MLM    Klebsiella oxytoca NOT DETECTED NOT DETECTED Final   Klebsiella pneumoniae NOT DETECTED NOT DETECTED Final   Proteus species NOT DETECTED NOT DETECTED Final   Serratia marcescens NOT DETECTED NOT DETECTED Final   Carbapenem resistance NOT DETECTED NOT DETECTED Final   Haemophilus influenzae NOT DETECTED NOT DETECTED Final   Neisseria meningitidis NOT DETECTED NOT DETECTED Final   Pseudomonas aeruginosa NOT DETECTED NOT DETECTED Final   Candida albicans NOT DETECTED NOT DETECTED Final   Candida glabrata NOT DETECTED NOT DETECTED Final   Candida krusei NOT DETECTED NOT DETECTED Final   Candida parapsilosis NOT DETECTED NOT DETECTED Final   Candida tropicalis NOT DETECTED NOT DETECTED Final    Comment: Performed at [facility], [place] [address] [place], [place] [address]  Blood Culture (routine x 2)     Status: Abnormal (Preliminary result)   Collection Time: 05/22/18  7:56 PM  Result Value Ref Range Status   Specimen Description   Final    BLOOD RIGHT WRIST Performed at [facility], [address] [place], [place] [address]    Special Requests   Final    BOTTLES DRAWN AEROBIC AND ANAEROBIC Blood Culture adequate volume Performed at [facility], [address] [place], [place] [address]    Culture  Setup Time   Final    GRAM NEGATIVE RODS IN BOTH AEROBIC AND ANAEROBIC BOTTLES Gram Stain Report Called to,Read Back By and Verified With: Edwin Dada 05/23/18 @1520  [id number]    Culture ESCHERICHIA COLI (A)  Final   Report Status PENDING  Incomplete  MRSA PCR Screening     Status: None   Collection Time: 05/23/18  8:10 AM  Result Value Ref Range Status   MRSA by PCR NEGATIVE NEGATIVE Final     Comment:        The GeneXpert MRSA Assay (FDA approved for NASAL specimens only), is one component of a comprehensive MRSA colonization surveillance program. It is not intended to diagnose MRSA infection nor to guide or monitor treatment for MRSA infections. Performed at [facility], [address], [place], [place] [address]  Radiology Studies: Dg Chest 2 View  Result Date: 05/22/2018 CLINICAL DATA:  67 y/o  F; fever. EXAM: CHEST - 2 VIEW COMPARISON:  09/30/2017 chest radiograph FINDINGS: Normal cardiac silhouette. Aortic atherosclerosis with calcification. Elevated right hemidiaphragm with colon superposition over the liver. Bibasilar opacity may represent pneumonia or atelectasis. No acute osseous abnormality is evident. IMPRESSION: 1. Right basilar opacity may represent pneumonia or atelectasis. 2. Stable elevated right hemidiaphragm and colon superposition over the liver. 3. Aortic atherosclerosis. Electronically Signed   By: Kristine Garbe M.D.   On: 05/22/2018 20:45        Scheduled Meds: . albuterol  2.5 mg Nebulization Q6H  . aspirin  325 mg Oral Daily  . buPROPion  300 mg Oral Daily  . feeding supplement (ENSURE ENLIVE)  237 mL Oral BID BM  . heparin  5,000 Units Subcutaneous Q8H  . insulin aspart  0-9 Units Subcutaneous TID WC  . levothyroxine  100 mcg Oral QAC breakfast  . levothyroxine  175 mcg Oral QAC breakfast  . mometasone-formoterol  2 puff Inhalation BID  . multivitamin with minerals  1 tablet Oral Daily  . nortriptyline  100 mg Oral QHS  . potassium chloride  40 mEq Oral Once  . topiramate  200 mg Oral QHS   Continuous Infusions: . ceFEPime (MAXIPIME) IV 2 g (05/24/18 1153)     LOS: 2 days    Time spent: 30 minutes    Nevan Creighton Darleen Crocker, DO [facility] Pager [phone]  If 7PM-7AM, please contact night-coverage [email or web address] Password [email or web address] 05/24/2018, 1:35 PM

## 2018-05-24 NOTE — Care Management Important Message (Signed)
Important Message  Patient Details  Name: Courtney Orozco MRN: [id number] Date of Birth: 02/27/1951   Medicare Important Message Given:  Yes    Shelda Altes 05/24/2018, 12:27 PM

## 2018-05-25 LAB — CULTURE, BLOOD (ROUTINE X 2)
SPECIAL REQUESTS: ADEQUATE
Special Requests: ADEQUATE

## 2018-05-25 LAB — GLUCOSE, CAPILLARY
GLUCOSE-CAPILLARY: 146 mg/dL — AB (ref 70–99)
GLUCOSE-CAPILLARY: 90 mg/dL (ref 70–99)
Glucose-Capillary: 100 mg/dL — ABNORMAL HIGH (ref 70–99)
Glucose-Capillary: 139 mg/dL — ABNORMAL HIGH (ref 70–99)

## 2018-05-25 LAB — CBC
HCT: 34.2 % — ABNORMAL LOW (ref 36.0–46.0)
Hemoglobin: 10.9 g/dL — ABNORMAL LOW (ref 12.0–15.0)
MCH: 29.7 pg (ref 26.0–34.0)
MCHC: 31.9 g/dL (ref 30.0–36.0)
MCV: 93.2 fL (ref 78.0–100.0)
Platelets: 201 10*3/uL (ref 150–400)
RBC: 3.67 MIL/uL — ABNORMAL LOW (ref 3.87–5.11)
RDW: 13.4 % (ref 11.5–15.5)
WBC: 5.9 10*3/uL (ref 4.0–10.5)

## 2018-05-25 LAB — URINE CULTURE: Culture: >=100000 — AB

## 2018-05-25 LAB — BASIC METABOLIC PANEL
Anion gap: 8 (ref 5–15)
BUN: 14 mg/dL (ref 8–23)
CHLORIDE: 115 mmol/L — AB (ref 98–111)
CO2: 18 mmol/L — AB (ref 22–32)
CREATININE: 0.97 mg/dL (ref 0.44–1.00)
Calcium: 8.8 mg/dL — ABNORMAL LOW (ref 8.9–10.3)
GFR calc Af Amer: >60 mL/min (ref >60)
GFR calc non Af Amer: 59 mL/min — ABNORMAL LOW (ref >60)
GLUCOSE: 99 mg/dL (ref 70–99)
Potassium: 3.6 mmol/L (ref 3.5–5.1)
SODIUM: 141 mmol/L (ref 135–145)

## 2018-05-25 MED ORDER — ERTAPENEM IV (FOR PTA / DISCHARGE USE ONLY): 1.0000 g | INTRAVENOUS | 0 refills | Status: AC

## 2018-05-25 MED ORDER — SODIUM CHLORIDE 0.9 % IV SOLN
1.0000 g | Freq: Three times a day (TID) | INTRAVENOUS | Status: DC
  Administered 2018-05-25: 1 g via INTRAVENOUS
  Filled 2018-05-25 (×7): qty 1

## 2018-05-25 NOTE — Progress Notes (Addendum)
Pharmacy Antibiotic Note  Courtney Orozco is a 67 y.o. female admitted on 05/22/2018 with ESBL blood/urine culture.  Pharmacy has been consulted for Meropenem dosing.  Plan: Meropenem 1000 mg IV every 8 hours  Monitor labs and patient improvement  Height: 5\' 5"  (165.1 cm) Weight: 210 lb 8.6 oz (95.5 kg) IBW/kg (Calculated) : 57  Temp (24hrs), Avg:98.2 F (36.8 C), Min:98.1 F (36.7 C), Max:98.3 F (36.8 C)  Recent Labs  Lab 05/22/18 1945 05/22/18 1956 05/23/18 0405 05/24/18 0518 05/25/18 0617  WBC 12.9*  --  11.7* 7.2 5.9  CREATININE 1.19*  --  1.19* 1.02* 0.97  LATICACIDVEN  --  0.98  --  0.6  --     Estimated Creatinine Clearance: 63.4 mL/min (by C-G formula based on SCr of 0.97 mg/dL).    Allergies  Allergen Reactions  . Ciprofloxacin Hives    Can take levaquin with no problems per patient and family  . Macrobid [Nitrofurantoin] Nausea And Vomiting  . Azithromycin Itching and Swelling  . Sulfa Antibiotics Itching    Antimicrobials this admission: Meropenem 8/3 >>  Cefepime 7/31 >> 8/3 Vancomycin 7/31 >> 8/1  Dose adjustments this admission: N/A  Microbiology results: 7/31 BCx: ESBL e. coli 7/31 UCx: ESBL e. coli  7/31 MRSA PCR: negative  Thank you for allowing pharmacy to be a part of this patient's care.  Ramond Craver 05/25/2018 10:29 AM

## 2018-05-25 NOTE — Discharge Summary (Signed)
Physician Discharge Summary  Courtney Orozco VXB:[id number] DOB: 20-May-1951 DOA: 05/22/2018  PCP: Galen Manila, MD  Admit date: 05/22/2018  Discharge date: 05/25/2018  Admitted From:Home  Disposition:  Home health  Recommendations for Outpatient Follow-up:  1. Follow up with PCP in 1-2 weeks 2. Continue on Ertapenem for 14 days with home infusion services  Home Health:With PT and RN  Equipment/Devices:None  Discharge Condition:Stable  CODE STATUS: Full  Diet recommendation: Heart Healthy/Carb Modified  Brief/Interim Summary:  Courtney Orozco a 67 y.o.femalewith medical history significant of anxiety, depression, osteoarthritis, chronic back pain, type 2 diabetes without complication, history of TIA, history of migraine headaches, history of gait instability, GERD, history of hepatitis B decades ago, urolithiasis, hypertension, hypothyroidism, history of pneumonia, history of TIA history of memory loss who is brought via EMS to the emergency department after she was taken to her PCP today by family members due to confusion, likely secondary to UTI. When EMS evaluated the patient she was febrile with a temperature of 103 F and hypoxic with an O2 sat of 56% on room air.She has been admitted with pneumoniaas well as UTI.She is noted to have a recurrence of E. coli bacteremia and is noted to have ESBL.  She has been switched to [facility] today and has received a PICC line with plans to receive ertapenem for total of 14 days starting tomorrow with home infusion services.  She has ambulated with no need for home oxygen noted.   Discharge Diagnoses:  Principal Problem:   Sepsis secondary to UTI [facility]) Active Problems:   GERD (gastroesophageal reflux disease)   Hypothyroidism   Type 2 diabetes mellitus (HCC)   Anxiety and depression   HCAP (healthcare-associated pneumonia)   Chronic back pain  1. Acute encephalopathy secondary to sepsiswithEColi ESBL UTI and bacteremia along  with PNA-resolved.  Will be discharged with PICC line and ertapenem infusion daily at home for 14 days. 2. Acute hypoxemic respiratory failure 2/2 pneumonia- resolved and off oxygen. 3. GERD. PPI daily.  4. Hypothyroidism. Continue levothyroxine. 5. Type 2 diabetes. Well controlled.  Continue home medications. 6. Anxiety and depression. Continue Wellbutrin and nortriptyline. 7. Chronic back pain. Continue Norco as needed.   Discharge Instructions  Discharge Instructions    Diet - low sodium heart healthy   Complete by:  As directed    Home infusion instructions [facility] May follow [facility] Dosing Protocol; May administer Cathflo as needed to maintain patency of vascular access device.; Flushing of vascular access device: per [facility] Protocol: 0.9% NaCl pre/post medica...   Complete by:  As directed    Instructions:  May follow [place] Dosing Protocol   Instructions:  May administer Cathflo as needed to maintain patency of vascular access device.   Instructions:  Flushing of vascular access device: per [facility] Protocol: 0.9% NaCl pre/post medication administration and prn patency; Heparin 100 u/ml, 63m for implanted ports and Heparin 10u/ml, 562mfor all other central venous catheters.   Instructions:  May follow [facility] Anaphylaxis Protocol for First Dose Administration in the home: 0.9% NaCl at 25-50 ml/hr to maintain IV access for protocol meds. Epinephrine 0.3 ml IV/IM PRN and Benadryl 25-50 IV/IM PRN s/s of anaphylaxis.   Instructions:  [facility] Coordinator (RN) to assist per patient IV care needs in the home PRN.   Increase activity slowly   Complete by:  As directed      Allergies as of 05/25/2018      Reactions  Ciprofloxacin Hives   Can take levaquin with no problems per patient and family   Macrobid [nitrofurantoin] Nausea And Vomiting   Azithromycin Itching, Swelling   Sulfa Antibiotics Itching      Medication List    TAKE these medications    ADVAIR DISKUS 250-50 MCG/DOSE Aepb Generic drug:  Fluticasone-Salmeterol Inhale 1 puff into the lungs daily as needed (shortness of breath).   albuterol 108 (90 Base) MCG/ACT inhaler Commonly known as:  PROVENTIL HFA;VENTOLIN HFA Inhale 2 puffs into the lungs every 6 (six) hours as needed for wheezing or shortness of breath.   aspirin 325 MG tablet Take 325 mg by mouth daily.   buPROPion 300 MG 24 hr tablet Commonly known as:  WELLBUTRIN XL Take 300 mg by mouth daily.   cyclobenzaprine 5 MG tablet Commonly known as:  FLEXERIL Take 5 mg by mouth 3 (three) times daily as needed for muscle spasms.   diazepam 5 MG tablet Commonly known as:  VALIUM Take 5 mg by mouth every 8 (eight) hours as needed for anxiety or muscle spasms.   ertapenem IVPB Commonly known as:  INVANZ Inject 1 g into the vein daily for 13 days. Indication:  ESBL bacteremia/UTI Last Day of Therapy:  Through the end of 06/07/2018 Labs - Once weekly:  CBC/D and BMP, Labs - Every other week:  ESR and CRP Start taking on:  05/26/2018   Flax Oil-Fish Oil-Borage Oil Caps Take 1 capsule by mouth daily.   HYDROcodone-acetaminophen 5-325 MG tablet Commonly known as:  NORCO/VICODIN Take 1 tablet by mouth every 4 (four) hours as needed for moderate pain.   KLOR-CON M20 20 MEQ tablet Generic drug:  potassium chloride SA Take 40 mEq by mouth 3 (three) times daily.   levothyroxine 100 MCG tablet Commonly known as:  SYNTHROID, LEVOTHROID Take 100 mcg by mouth daily. Take with 138mg tablet for total dose of 2795m daily   levothyroxine 175 MCG tablet Commonly known as:  SYNTHROID, LEVOTHROID Take 175 mcg by mouth daily. Take one tablet with 10019mtablet for total dose of 275m48m multivitamin with minerals Tabs tablet Take 1 tablet by mouth daily.   nortriptyline 50 MG capsule Commonly known as:  PAMELOR Take 100 mg by mouth at bedtime.   topiramate 100 MG tablet Commonly known as:  TOPAMAX Take 200 mg by  mouth at bedtime.   torsemide 20 MG tablet Commonly known as:  DEMADEX Take 10 mg by mouth 2 (two) times daily as needed (fluid).            Home Infusion Instuctions  (From admission, onward)        Start     Ordered   05/25/18 0000  Home infusion instructions [facility] May follow [facility] Protocol; May administer Cathflo as needed to maintain patency of vascular access device.; Fl[facility] vascular access [facility]: 0.9% NaCl pre/post medica...    Question Answer Comment  Instructions May follow [facility] Protocol   Instructions May administer Cathflo as needed to maintain patency of vascular access device.   Instructions Flushing of vascular access device: per [facility]: 0.9% NaCl pre/post medication administration and prn patency; Heparin 100 u/ml, 5ml 87m implanted ports and Heparin 10u/ml, 5ml f33mall other central venous catheters.   Instructions May follow [facility] Anaphylaxis Protocol for First Dose Administration in the home: 0.9% NaCl at 25-50 ml/hr to maintain IV access for protocol meds. Epinephrine 0.3 ml IV/IM PRN and  Benadryl 25-50 IV/IM PRN s/s of anaphylaxis.   Instructions [facility] Infusion Coordinator (RN) to assist per patient IV care needs in the home PRN.      05/25/18 [place] Follow up.   Why:  RN and PT Contact information: [address] [place]       Galen Manila, MD Follow up in 2 week(s).   Specialty:  Internal Medicine Contact information: [address] [place] [address] [phone]          Allergies  Allergen Reactions  . Ciprofloxacin Hives    Can take levaquin with no problems per patient and family  . Macrobid [Nitrofurantoin] Nausea And Vomiting  . Azithromycin Itching and Swelling  . Sulfa Antibiotics Itching    Consultations:  None   Procedures/Studies: Dg Chest  2 View  Result Date: 05/22/2018 CLINICAL DATA:  67 y/o  F; fever. EXAM: CHEST - 2 VIEW COMPARISON:  09/30/2017 chest radiograph FINDINGS: Normal cardiac silhouette. Aortic atherosclerosis with calcification. Elevated right hemidiaphragm with colon superposition over the liver. Bibasilar opacity may represent pneumonia or atelectasis. No acute osseous abnormality is evident. IMPRESSION: 1. Right basilar opacity may represent pneumonia or atelectasis. 2. Stable elevated right hemidiaphragm and colon superposition over the liver. 3. Aortic atherosclerosis. Electronically Signed   By: Kristine Garbe M.D.   On: 05/22/2018 20:45   Discharge Exam: Vitals:   05/25/18 0724 05/25/18 1344  BP:  (!) 109/45  Pulse:  79  Resp:  18  Temp:  98.1 F (36.7 C)  SpO2: 96% 96%   Vitals:   05/25/18 0222 05/25/18 0520 05/25/18 0724 05/25/18 1344  BP:  128/66  (!) 109/45  Pulse:  91  79  Resp:  20  18  Temp:  98.3 F (36.8 C)  98.1 F (36.7 C)  TempSrc:  Oral  Oral  SpO2: 98% 95% 96% 96%  Weight:      Height:        General: Pt is alert, awake, not in acute distress Cardiovascular: RRR, S1/S2 +, no rubs, no gallops Respiratory: CTA bilaterally, no wheezing, no rhonchi Abdominal: Soft, NT, ND, bowel sounds + Extremities: no edema, no cyanosis    The results of significant diagnostics from this hospitalization (including imaging, microbiology, ancillary and laboratory) are listed below for reference.     Microbiology: Recent Results (from the past 240 hour(s))  Blood Culture (routine x 2)     Status: Abnormal   Collection Time: 05/22/18  7:45 PM  Result Value Ref Range Status   Specimen Description   Final    BLOOD RIGHT ARM RN BLC DRAW Performed at [facility], [address] [place], [place] [address]    Special Requests   Final    BOTTLES DRAWN AEROBIC AND ANAEROBIC Blood Culture adequate volume Performed at [facility], [address] [place], [place] [address]    Culture   Setup Time   Final    GRAM NEGATIVE RODS IN BOTH AEROBIC AND ANAEROBIC BOTTLES CRITICAL RESULT CALLED TO, READ BACK BY AND VERIFIED WITH: RN Marchelle Folks [phone] MLM Performed at [facility], [place] [address] [place], [place] [address]    Culture (A)  Final    ESCHERICHIA COLI Confirmed Extended Spectrum Beta-Lactamase Producer (ESBL).  In bloodstream infections from ESBL organisms, carbapenems are preferred over piperacillin/tazobactam. They are shown to have a lower risk of  mortality.    Report Status 05/25/2018 FINAL  Final   Organism ID, Bacteria ESCHERICHIA COLI  Final      Susceptibility   Escherichia coli - MIC*    AMPICILLIN >=32 RESISTANT Resistant     CEFAZOLIN >=64 RESISTANT Resistant     CEFEPIME RESISTANT Resistant     CEFTAZIDIME RESISTANT Resistant     CEFTRIAXONE >=64 RESISTANT Resistant     CIPROFLOXACIN >=4 RESISTANT Resistant     GENTAMICIN >=16 RESISTANT Resistant     IMIPENEM <=0.25 SENSITIVE Sensitive     TRIMETH/SULFA >=320 RESISTANT Resistant     AMPICILLIN/SULBACTAM >=32 RESISTANT Resistant     PIP/TAZO 8 SENSITIVE Sensitive     Extended ESBL POSITIVE Resistant     * ESCHERICHIA COLI  Urine culture     Status: Abnormal   Collection Time: 05/22/18  7:45 PM  Result Value Ref Range Status   Specimen Description   Final    URINE, RANDOM Performed at [facility], [address] [place], [place] [address]    Special Requests   Final    NONE Performed at [facility], [address], [place], [place] [address]    Culture (A)  Final    >=100,000 COLONIES/mL ESCHERICHIA COLI Confirmed Extended Spectrum Beta-Lactamase Producer (ESBL).  In bloodstream infections from ESBL organisms, carbapenems are preferred over piperacillin/tazobactam. They are shown to have a lower risk of mortality.    Report Status 05/25/2018 FINAL  Final   Organism ID, Bacteria ESCHERICHIA COLI (A)  Final      Susceptibility   Escherichia coli - MIC*    AMPICILLIN >=32  RESISTANT Resistant     CEFAZOLIN >=64 RESISTANT Resistant     CEFTRIAXONE >=64 RESISTANT Resistant     CIPROFLOXACIN >=4 RESISTANT Resistant     GENTAMICIN >=16 RESISTANT Resistant     IMIPENEM <=0.25 SENSITIVE Sensitive     NITROFURANTOIN 128 RESISTANT Resistant     TRIMETH/SULFA >=320 RESISTANT Resistant     AMPICILLIN/SULBACTAM >=32 RESISTANT Resistant     PIP/TAZO 8 SENSITIVE Sensitive     Extended ESBL POSITIVE Resistant     * >=100,000 COLONIES/mL ESCHERICHIA COLI  Blood Culture ID Panel (Reflexed)     Status: Abnormal   Collection Time: 05/22/18  7:45 PM  Result Value Ref Range Status   Enterococcus species NOT DETECTED NOT DETECTED Final   Listeria monocytogenes NOT DETECTED NOT DETECTED Final   Staphylococcus species NOT DETECTED NOT DETECTED Final   Staphylococcus aureus NOT DETECTED NOT DETECTED Final   Streptococcus species NOT DETECTED NOT DETECTED Final   Streptococcus agalactiae NOT DETECTED NOT DETECTED Final   Streptococcus pneumoniae NOT DETECTED NOT DETECTED Final   Streptococcus pyogenes NOT DETECTED NOT DETECTED Final   Acinetobacter baumannii NOT DETECTED NOT DETECTED Final   Enterobacteriaceae species DETECTED (A) NOT DETECTED Final    Comment: Enterobacteriaceae represent a large family of gram-negative bacteria, not a single organism. CRITICAL RESULT CALLED TO, READ BACK BY AND VERIFIED WITH: RN Marchelle Folks [phone] MLM    Enterobacter cloacae complex NOT DETECTED NOT DETECTED Final   Escherichia coli DETECTED (A) NOT DETECTED Final    Comment: CRITICAL RESULT CALLED TO, READ BACK BY AND VERIFIED WITH: RN Marchelle Folks [phone] MLM    Klebsiella oxytoca NOT DETECTED NOT DETECTED Final   Klebsiella pneumoniae NOT DETECTED NOT DETECTED Final   Proteus species NOT DETECTED NOT DETECTED Final   Serratia marcescens NOT DETECTED NOT DETECTED Final   Carbapenem resistance NOT  DETECTED NOT DETECTED Final   Haemophilus influenzae NOT DETECTED NOT DETECTED  Final   Neisseria meningitidis NOT DETECTED NOT DETECTED Final   Pseudomonas aeruginosa NOT DETECTED NOT DETECTED Final   Candida albicans NOT DETECTED NOT DETECTED Final   Candida glabrata NOT DETECTED NOT DETECTED Final   Candida krusei NOT DETECTED NOT DETECTED Final   Candida parapsilosis NOT DETECTED NOT DETECTED Final   Candida tropicalis NOT DETECTED NOT DETECTED Final    Comment: Performed at [facility], [place] [address], [place], [place] [address]  Blood Culture (routine x 2)     Status: Abnormal   Collection Time: 05/22/18  7:56 PM  Result Value Ref Range Status   Specimen Description   Final    BLOOD RIGHT WRIST Performed at [facility], [address], [place], [place] [address]    Special Requests   Final    BOTTLES DRAWN AEROBIC AND ANAEROBIC Blood Culture adequate volume Performed at [facility], [address], [place], [place] [address]    Culture  Setup Time   Final    GRAM NEGATIVE RODS IN BOTH AEROBIC AND ANAEROBIC BOTTLES Gram Stain Report Called to,Read Back By and Verified With: Edwin Dada 05/23/18 _0  LSCHMIDT    Culture (A)  Final    ESCHERICHIA COLI SUSCEPTIBILITIES PERFORMED ON PREVIOUS CULTURE WITHIN THE LAST 5 DAYS. Performed at [facility], [place] [address] [place],  [address]    Report Status 05/25/2018 FINAL  Final  MRSA PCR Screening     Status: None   Collection Time: 05/23/18  8:10 AM  Result Value Ref Range Status   MRSA by PCR NEGATIVE NEGATIVE Final    Comment:        The GeneXpert MRSA Assay (FDA approved for NASAL specimens only), is one component of a comprehensive MRSA colonization surveillance program. It is not intended to diagnose MRSA infection nor to guide or monitor treatment for MRSA infections. Performed at [facility], [address] [place],  [address]      Labs: BNP (last 3 results) Recent Labs    10/01/17 0650  BNP 75.8   Basic Metabolic Panel: Recent Labs  Lab 05/22/18 1945  05/23/18 0405 05/24/18 0518 05/25/18 0617  NA 137 139 141 141  K 4.1 3.9 3.4* 3.6  CL 110 114* 117* 115*  CO2 19* 19* 17* 18*  GLUCOSE 143* 102* 101* 99  BUN _1 CREATININE 1.19* 1.19* 1.02* 0.97  CALCIUM 8.5* 8.2* 8.4* 8.8*   Liver Function Tests: Recent Labs  Lab 05/22/18 1945 05/23/18 0405  AST 13* 11*  ALT 15 12  ALKPHOS 92 81  BILITOT 0.7 0.6  PROT 6.9 6.0*  ALBUMIN 3.1* 2.7*   No results for input(s): LIPASE, AMYLASE in the last 168 hours. No results for input(s): AMMONIA in the last 168 hours. CBC: Recent Labs  Lab 05/22/18 1945 05/23/18 0405 05/24/18 0518 05/25/18 0617  WBC 12.9* 11.7* 7.2 5.9  NEUTROABS 11.1* 9.2 5.4  --   HGB 13.1 12.3 10.7* 10.9*  HCT 39.8 38.9 33.1* 34.2*  MCV 95.0 96.5 94.3 93.2  PLT 227 174 188 201   Cardiac Enzymes: Recent Labs  Lab 05/22/18 1945  TROPONINI <0.03   BNP: Invalid input(s): POCBNP CBG: Recent Labs  Lab 05/24/18 1626 05/24/18 2139 05/25/18 0243 05/25/18 0832 05/25/18 1141  GLUCAP 80 72 100* 146* 139*   D-Dimer No results for input(s): DDIMER in the last 72 hours. Hgb A1c  No results for input(s): HGBA1C in the last 72 hours. Lipid Profile No results for input(s): CHOL, HDL, LDLCALC, TRIG, CHOLHDL, LDLDIRECT in the last 72 hours. Thyroid function studies No results for input(s): TSH, T4TOTAL, T3FREE, THYROIDAB in the last 72 hours.  Invalid input(s): FREET3 Anemia work up No results for input(s): VITAMINB12, FOLATE, FERRITIN, TIBC, IRON, RETICCTPCT in the last 72 hours. Urinalysis    Component Value Date/Time   COLORURINE YELLOW 05/22/2018 1945   APPEARANCEUR CLOUDY (A) 05/22/2018 1945   LABSPEC 1.014 05/22/2018 1945   PHURINE 5.0 05/22/2018 1945   GLUCOSEU NEGATIVE 05/22/2018 1945   HGBUR SMALL (A) 05/22/2018 1945   BILIRUBINUR NEGATIVE 05/22/2018 [place] NEGATIVE 05/22/2018 1945   PROTEINUR 30 (A) 05/22/2018 1945   NITRITE POSITIVE (A) 05/22/2018 1945   LEUKOCYTESUR LARGE  (A) 05/22/2018 1945   Sepsis Labs Invalid input(s): PROCALCITONIN,  WBC,  LACTICIDVEN Microbiology Recent Results (from the past 240 hour(s))  Blood Culture (routine x 2)     Status: Abnormal   Collection Time: 05/22/18  7:45 PM  Result Value Ref Range Status   Specimen Description   Final    BLOOD RIGHT ARM RN BLC DRAW Performed at [facility], [address], [place], [place] [address]    Special Requests   Final    BOTTLES DRAWN AEROBIC AND ANAEROBIC Blood Culture adequate volume Performed at [facility], [address], [place], [place] [address]    Culture  Setup Time   Final    GRAM NEGATIVE RODS IN BOTH AEROBIC AND ANAEROBIC BOTTLES CRITICAL RESULT CALLED TO, READ BACK BY AND VERIFIED WITH: RN Marchelle Folks [phone] MLM Performed at [facility], [place] [address], [place], [place] [address]    Culture (A)  Final    ESCHERICHIA COLI Confirmed Extended Spectrum Beta-Lactamase Producer (ESBL).  In bloodstream infections from ESBL organisms, carbapenems are preferred over piperacillin/tazobactam. They are shown to have a lower risk of mortality.    Report Status 05/25/2018 FINAL  Final   Organism ID, Bacteria ESCHERICHIA COLI  Final      Susceptibility   Escherichia coli - MIC*    AMPICILLIN >=32 RESISTANT Resistant     CEFAZOLIN >=64 RESISTANT Resistant     CEFEPIME RESISTANT Resistant     CEFTAZIDIME RESISTANT Resistant     CEFTRIAXONE >=64 RESISTANT Resistant     CIPROFLOXACIN >=4 RESISTANT Resistant     GENTAMICIN >=16 RESISTANT Resistant     IMIPENEM <=0.25 SENSITIVE Sensitive     TRIMETH/SULFA >=320 RESISTANT Resistant     AMPICILLIN/SULBACTAM >=32 RESISTANT Resistant     PIP/TAZO 8 SENSITIVE Sensitive     Extended ESBL POSITIVE Resistant     * ESCHERICHIA COLI  Urine culture     Status: Abnormal   Collection Time: 05/22/18  7:45 PM  Result Value Ref Range Status   Specimen Description   Final    URINE, RANDOM Performed at [facility], [address], [place], [place] [address]    Special Requests   Final    NONE Performed at [facility], [address], [place], [place] [address]    Culture (A)  Final    >=100,000 COLONIES/mL ESCHERICHIA COLI Confirmed Extended Spectrum Beta-Lactamase Producer (ESBL).  In bloodstream infections from ESBL organisms, carbapenems are preferred over piperacillin/tazobactam. They are shown to have a lower risk of mortality.    Report Status 05/25/2018 FINAL  Final   Organism ID, Bacteria ESCHERICHIA COLI (A)  Final  Susceptibility   Escherichia coli - MIC*    AMPICILLIN >=32 RESISTANT Resistant     CEFAZOLIN >=64 RESISTANT Resistant     CEFTRIAXONE >=64 RESISTANT Resistant     CIPROFLOXACIN >=4 RESISTANT Resistant     GENTAMICIN >=16 RESISTANT Resistant     IMIPENEM <=0.25 SENSITIVE Sensitive     NITROFURANTOIN 128 RESISTANT Resistant     TRIMETH/SULFA >=320 RESISTANT Resistant     AMPICILLIN/SULBACTAM >=32 RESISTANT Resistant     PIP/TAZO 8 SENSITIVE Sensitive     Extended ESBL POSITIVE Resistant     * >=100,000 COLONIES/mL ESCHERICHIA COLI  Blood Culture ID Panel (Reflexed)     Status: Abnormal   Collection Time: 05/22/18  7:45 PM  Result Value Ref Range Status   Enterococcus species NOT DETECTED NOT DETECTED Final   Listeria monocytogenes NOT DETECTED NOT DETECTED Final   Staphylococcus species NOT DETECTED NOT DETECTED Final   Staphylococcus aureus NOT DETECTED NOT DETECTED Final   Streptococcus species NOT DETECTED NOT DETECTED Final   Streptococcus agalactiae NOT DETECTED NOT DETECTED Final   Streptococcus pneumoniae NOT DETECTED NOT DETECTED Final   Streptococcus pyogenes NOT DETECTED NOT DETECTED Final   Acinetobacter baumannii NOT DETECTED NOT DETECTED Final   Enterobacteriaceae species DETECTED (A) NOT DETECTED Final    Comment: Enterobacteriaceae represent a large family of gram-negative bacteria, not a single organism. CRITICAL RESULT CALLED TO, READ BACK BY AND VERIFIED  WITH: RN Marchelle Folks [phone] MLM    Enterobacter cloacae complex NOT DETECTED NOT DETECTED Final   Escherichia coli DETECTED (A) NOT DETECTED Final    Comment: CRITICAL RESULT CALLED TO, READ BACK BY AND VERIFIED WITH: RN Marchelle Folks [phone] MLM    Klebsiella oxytoca NOT DETECTED NOT DETECTED Final   Klebsiella pneumoniae NOT DETECTED NOT DETECTED Final   Proteus species NOT DETECTED NOT DETECTED Final   Serratia marcescens NOT DETECTED NOT DETECTED Final   Carbapenem resistance NOT DETECTED NOT DETECTED Final   Haemophilus influenzae NOT DETECTED NOT DETECTED Final   Neisseria meningitidis NOT DETECTED NOT DETECTED Final   Pseudomonas aeruginosa NOT DETECTED NOT DETECTED Final   Candida albicans NOT DETECTED NOT DETECTED Final   Candida glabrata NOT DETECTED NOT DETECTED Final   Candida krusei NOT DETECTED NOT DETECTED Final   Candida parapsilosis NOT DETECTED NOT DETECTED Final   Candida tropicalis NOT DETECTED NOT DETECTED Final    Comment: Performed at [facility], [place] [address], [place], [place] [address]  Blood Culture (routine x 2)     Status: Abnormal   Collection Time: 05/22/18  7:56 PM  Result Value Ref Range Status   Specimen Description   Final    BLOOD RIGHT WRIST Performed at [facility], [address] [place], [place] [address]    Special Requests   Final    BOTTLES DRAWN AEROBIC AND ANAEROBIC Blood Culture adequate volume Performed at [facility], [address], [place], [place] [address]    Culture  Setup Time   Final    GRAM NEGATIVE RODS IN BOTH AEROBIC AND ANAEROBIC BOTTLES Gram Stain Report Called to,Read Back By and Verified With: Edwin Dada 05/23/18 _0  LSCHMIDT    Culture (A)  Final    ESCHERICHIA COLI SUSCEPTIBILITIES PERFORMED ON PREVIOUS CULTURE WITHIN THE LAST 5 DAYS. Performed at [facility], [place] [address] [place],  [address]    Report Status 05/25/2018 FINAL  Final  MRSA PCR Screening     Status: None  Collection Time: 05/23/18  8:10 AM  Result Value Ref Range Status   MRSA by PCR NEGATIVE NEGATIVE Final    Comment:        The GeneXpert MRSA Assay (FDA approved for NASAL specimens only), is one component of a comprehensive MRSA colonization surveillance program. It is not intended to diagnose MRSA infection nor to guide or monitor treatment for MRSA infections. Performed at [facility], [address] [place], [place] [address]      Time coordinating discharge: 40 minutes  SIGNED:   Rodena Goldmann, DO [facility] 05/25/2018, 3:52 PM Pager [phone]  If 7PM-7AM, please contact night-coverage [email or web address] Password [email or web address]

## 2018-05-25 NOTE — Progress Notes (Signed)
Removed IV-clean, dry, intact. Reviewed d/c paperwork with patient and daughter. Discussed home heath and home IV antibiotics. Gave paper prescription for IV antibiotic. Wheeled stable patient to main entrance where she was picked up by daughter.

## 2018-05-25 NOTE — Progress Notes (Signed)
SATURATION QUALIFICATIONS: (This note is used to comply with regulatory documentation for home oxygen)  Patient Saturations on Room Air at Rest = 98%  Patient Saturations on Room Air while Ambulating = 96%  Patient Saturations on 0 Liters of oxygen while Ambulating = 96%  Please briefly explain why patient needs home oxygen: 

## 2018-05-25 NOTE — Care Management (Addendum)
Pt to be d/c'd on IV abx due to allergies and inability to use oral antibiotics.  Pt having midline catheter placed currently.  OPAT consult done and orders signed by Dr. Manuella Ghazi.  Home Health orders complete.  Dr. Manuella Ghazi advised patient can be d/c today with first home administration by [facility] RN tomorrow.  Dr. Manuella Ghazi states patient will stay tonight and be d/c tomorrow with first home dose planned for Monday.  Jermaine with [facility] aware and will plan accordingly.     05/25/18 1555 Dr. Manuella Ghazi states patient and family do want to d/c today.  [facility] will start home infusions tomorrow.

## 2018-05-25 NOTE — Progress Notes (Signed)
PHARMACY CONSULT NOTE FOR:  OUTPATIENT  PARENTERAL ANTIBIOTIC THERAPY (OPAT)  Indication: ESBL bacteremia/UTI Regimen: Ertapenem 1000 mg IV every 24 hours for 13 days (14 days total with inpatient meropenem started 8/3) End date: last day of therapy 06/07/18  IV antibiotic discharge orders are pended. To discharging provider:  please sign these orders via discharge navigator,  Select New Orders & click on the button choice - Manage This Unsigned Work.     Thank you for allowing pharmacy to be a part of this patient's care.  Ramond Craver 05/25/2018, 1:18 PM

## 2018-05-25 NOTE — Progress Notes (Signed)
PROGRESS NOTE    IQRA ROTUNDO  ZDG:[id number] DOB: 26-Dec-1950 DOA: 05/22/2018 PCP: Galen Manila, MD   Brief Narrative:   ASTRYD PEARCY a 67 y.o.femalewith medical history significant of anxiety, depression, osteoarthritis, chronic back pain, type 2 diabetes without complication, history of TIA, history of migraine headaches, history of gait instability, GERD, history of hepatitis B decades ago, urolithiasis, hypertension, hypothyroidism, history of pneumonia, history of TIA history of memory loss who is brought via EMS to the emergency department after she was taken to her PCP today by family members due to confusion, likely secondary to UTI. When EMS evaluated the patient she was febrile with a temperature of 103 F and hypoxic with an O2 sat of 56% on room air.She has been admitted with pneumoniaas well as UTI.  She is noted to have a recurrence of E. coli bacteremia and is noted to have ESBL.  She has been switched to [place] today and overall is still clinically improving.    Assessment & Plan:   Principal Problem:   Sepsis secondary to UTI [facility]) Active Problems:   GERD (gastroesophageal reflux disease)   Hypothyroidism   Type 2 diabetes mellitus (HCC)   Anxiety and depression   HCAP (healthcare-associated pneumonia)   Chronic back pain   1. Acute encephalopathy secondary to sepsiswith EColi ESBL UTI and bacteremia along with PNA. Discontinue cefepime and switch to Merrem on account of ESBL E. coli.  Will place midline with anticipated discharge tomorrow on ertapenem for a total course of 14 days.  Continue to hold muscle relaxers for now as she is more awake and alert today. 2. Acute hypoxemic respiratory failure 2/2 pneumonia-improving. Wean oxygen as tolerated.  Ambulatory O2 screening today. 3. GERD. PPI daily.  4. Hypothyroidism. Continue levothyroxine. 5. Type 2 diabetes. Continue sliding scale insulin with carb modified diet. Currently well  controlled. 6. Anxiety and depression. Continue Wellbutrin and nortriptyline. 7. Chronic back pain. Continue Norco as needed.   DVT prophylaxis:Heparin Code Status:Full Family Communication:Husband at bedside Disposition Plan:Continue on Merrem and anticipate discharge tomorrow with IV infusions of ertapenem in the outpatient setting.  Midline placement today.   Consultants:  None  Procedures:  None  Antimicrobials:  Vancomycin 7/31-8/1  Cefepime 7/31->8/3  Meropenem 8/3->  Subjective: Patient seen and evaluated today with no new acute complaints or concerns. No acute concerns or events noted overnight.  Patient is much more awake and alert this morning and states that she is having some improvements in her appetite.  Objective: Vitals:   05/24/18 2142 05/25/18 0222 05/25/18 0520 05/25/18 0724  BP: (!) 123/55  128/66   Pulse: 90  91   Resp: 20  20   Temp: 98.1 F (36.7 C)  98.3 F (36.8 C)   TempSrc: Oral  Oral   SpO2: 99% 98% 95% 96%  Weight:      Height:        Intake/Output Summary (Last 24 hours) at 05/25/2018 1312 Last data filed at 05/25/2018 0522 Gross per 24 hour  Intake 181.28 ml  Output 400 ml  Net -218.72 ml   Filed Weights   05/22/18 1911 05/23/18 0058  Weight: 91.6 kg (202 lb) 95.5 kg (210 lb 8.6 oz)    Examination:  General exam: Appears calm and comfortable  Respiratory system: Clear to auscultation. Respiratory effort normal. Cardiovascular system: S1 & S2 heard, RRR. No JVD, murmurs, rubs, gallops or clicks. No pedal edema. Gastrointestinal system: Abdomen is nondistended, soft and nontender. No organomegaly  or masses felt. Normal bowel sounds heard. Central nervous system: Alert and oriented. No focal neurological deficits. Extremities: Symmetric 5 x 5 power. Skin: No rashes, lesions or ulcers Psychiatry: Judgement and insight appear normal. Mood & affect appropriate.     Data Reviewed: I have personally reviewed  following labs and imaging studies  CBC: Recent Labs  Lab 05/22/18 1945 05/23/18 0405 05/24/18 0518 05/25/18 0617  WBC 12.9* 11.7* 7.2 5.9  NEUTROABS 11.1* 9.2 5.4  --   HGB 13.1 12.3 10.7* 10.9*  HCT 39.8 38.9 33.1* 34.2*  MCV 95.0 96.5 94.3 93.2  PLT 227 174 188 976   Basic Metabolic Panel: Recent Labs  Lab 05/22/18 1945 05/23/18 0405 05/24/18 0518 05/25/18 0617  NA 137 139 141 141  K 4.1 3.9 3.4* 3.6  CL 110 114* 117* 115*  CO2 19* 19* 17* 18*  GLUCOSE 143* 102* 101* 99  BUN 16 16 16 14   CREATININE 1.19* 1.19* 1.02* 0.97  CALCIUM 8.5* 8.2* 8.4* 8.8*   GFR: Estimated Creatinine Clearance: 63.4 mL/min (by C-G formula based on SCr of 0.97 mg/dL). Liver Function Tests: Recent Labs  Lab 05/22/18 1945 05/23/18 0405  AST 13* 11*  ALT 15 12  ALKPHOS 92 81  BILITOT 0.7 0.6  PROT 6.9 6.0*  ALBUMIN 3.1* 2.7*   No results for input(s): LIPASE, AMYLASE in the last 168 hours. No results for input(s): AMMONIA in the last 168 hours. Coagulation Profile: No results for input(s): INR, PROTIME in the last 168 hours. Cardiac Enzymes: Recent Labs  Lab 05/22/18 1945  TROPONINI <0.03   BNP (last 3 results) No results for input(s): PROBNP in the last 8760 hours. HbA1C: No results for input(s): HGBA1C in the last 72 hours. CBG: Recent Labs  Lab 05/24/18 1626 05/24/18 2139 05/25/18 0243 05/25/18 0832 05/25/18 1141  GLUCAP 80 72 100* 146* 139*   Lipid Profile: No results for input(s): CHOL, HDL, LDLCALC, TRIG, CHOLHDL, LDLDIRECT in the last 72 hours. Thyroid Function Tests: No results for input(s): TSH, T4TOTAL, FREET4, T3FREE, THYROIDAB in the last 72 hours. Anemia Panel: No results for input(s): VITAMINB12, FOLATE, FERRITIN, TIBC, IRON, RETICCTPCT in the last 72 hours. Sepsis Labs: Recent Labs  Lab 05/22/18 1956 05/24/18 0518  LATICACIDVEN 0.98 0.6    Recent Results (from the past 240 hour(s))  Blood Culture (routine x 2)     Status: Abnormal    Collection Time: 05/22/18  7:45 PM  Result Value Ref Range Status   Specimen Description   Final    BLOOD RIGHT ARM RN BLC DRAW Performed at [facility], [address], [place], [place] [address]    Special Requests   Final    BOTTLES DRAWN AEROBIC AND ANAEROBIC Blood Culture adequate volume Performed at [facility], [address], [place], [place] [address]    Culture  Setup Time   Final    GRAM NEGATIVE RODS IN BOTH AEROBIC AND ANAEROBIC BOTTLES CRITICAL RESULT CALLED TO, READ BACK BY AND VERIFIED WITH: RN Marchelle Folks [phone] MLM Performed at [facility], [place] [address], [place], [place] [address]    Culture (A)  Final    ESCHERICHIA COLI Confirmed Extended Spectrum Beta-Lactamase Producer (ESBL).  In bloodstream infections from ESBL organisms, carbapenems are preferred over piperacillin/tazobactam. They are shown to have a lower risk of mortality.    Report Status 05/25/2018 FINAL  Final   Organism ID, Bacteria ESCHERICHIA COLI  Final      Susceptibility   Escherichia  coli - MIC*    AMPICILLIN >=32 RESISTANT Resistant     CEFAZOLIN >=64 RESISTANT Resistant     CEFEPIME RESISTANT Resistant     CEFTAZIDIME RESISTANT Resistant     CEFTRIAXONE >=64 RESISTANT Resistant     CIPROFLOXACIN >=4 RESISTANT Resistant     GENTAMICIN >=16 RESISTANT Resistant     IMIPENEM <=0.25 SENSITIVE Sensitive     TRIMETH/SULFA >=320 RESISTANT Resistant     AMPICILLIN/SULBACTAM >=32 RESISTANT Resistant     PIP/TAZO 8 SENSITIVE Sensitive     Extended ESBL POSITIVE Resistant     * ESCHERICHIA COLI  Urine culture     Status: Abnormal   Collection Time: 05/22/18  7:45 PM  Result Value Ref Range Status   Specimen Description   Final    URINE, RANDOM Performed at [facility], [address] [place], [place] [address]    Special Requests   Final    NONE Performed at [facility], [address] [place], [place] [address]    Culture (A)  Final    >=100,000 COLONIES/mL ESCHERICHIA  COLI Confirmed Extended Spectrum Beta-Lactamase Producer (ESBL).  In bloodstream infections from ESBL organisms, carbapenems are preferred over piperacillin/tazobactam. They are shown to have a lower risk of mortality.    Report Status 05/25/2018 FINAL  Final   Organism ID, Bacteria ESCHERICHIA COLI (A)  Final      Susceptibility   Escherichia coli - MIC*    AMPICILLIN >=32 RESISTANT Resistant     CEFAZOLIN >=64 RESISTANT Resistant     CEFTRIAXONE >=64 RESISTANT Resistant     CIPROFLOXACIN >=4 RESISTANT Resistant     GENTAMICIN >=16 RESISTANT Resistant     IMIPENEM <=0.25 SENSITIVE Sensitive     NITROFURANTOIN 128 RESISTANT Resistant     TRIMETH/SULFA >=320 RESISTANT Resistant     AMPICILLIN/SULBACTAM >=32 RESISTANT Resistant     PIP/TAZO 8 SENSITIVE Sensitive     Extended ESBL POSITIVE Resistant     * >=100,000 COLONIES/mL ESCHERICHIA COLI  Blood Culture ID Panel (Reflexed)     Status: Abnormal   Collection Time: 05/22/18  7:45 PM  Result Value Ref Range Status   Enterococcus species NOT DETECTED NOT DETECTED Final   Listeria monocytogenes NOT DETECTED NOT DETECTED Final   Staphylococcus species NOT DETECTED NOT DETECTED Final   Staphylococcus aureus NOT DETECTED NOT DETECTED Final   Streptococcus species NOT DETECTED NOT DETECTED Final   Streptococcus agalactiae NOT DETECTED NOT DETECTED Final   Streptococcus pneumoniae NOT DETECTED NOT DETECTED Final   Streptococcus pyogenes NOT DETECTED NOT DETECTED Final   Acinetobacter baumannii NOT DETECTED NOT DETECTED Final   Enterobacteriaceae species DETECTED (A) NOT DETECTED Final    Comment: Enterobacteriaceae represent a large family of gram-negative bacteria, not a single organism. CRITICAL RESULT CALLED TO, READ BACK BY AND VERIFIED WITH: RN Marchelle Folks [phone] MLM    Enterobacter cloacae complex NOT DETECTED NOT DETECTED Final   Escherichia coli DETECTED (A) NOT DETECTED Final    Comment: CRITICAL RESULT CALLED TO, READ BACK  BY AND VERIFIED WITH: RN Marchelle Folks [phone] MLM    Klebsiella oxytoca NOT DETECTED NOT DETECTED Final   Klebsiella pneumoniae NOT DETECTED NOT DETECTED Final   Proteus species NOT DETECTED NOT DETECTED Final   Serratia marcescens NOT DETECTED NOT DETECTED Final   Carbapenem resistance NOT DETECTED NOT DETECTED Final   Haemophilus influenzae NOT DETECTED NOT DETECTED Final   Neisseria meningitidis NOT DETECTED NOT DETECTED Final   Pseudomonas aeruginosa NOT  DETECTED NOT DETECTED Final   Candida albicans NOT DETECTED NOT DETECTED Final   Candida glabrata NOT DETECTED NOT DETECTED Final   Candida krusei NOT DETECTED NOT DETECTED Final   Candida parapsilosis NOT DETECTED NOT DETECTED Final   Candida tropicalis NOT DETECTED NOT DETECTED Final    Comment: Performed at [facility], [place] [address], [place], [place] [address]  Blood Culture (routine x 2)     Status: Abnormal   Collection Time: 05/22/18  7:56 PM  Result Value Ref Range Status   Specimen Description   Final    BLOOD RIGHT WRIST Performed at [facility], [address], [place], [place] [address]    Special Requests   Final    BOTTLES DRAWN AEROBIC AND ANAEROBIC Blood Culture adequate volume Performed at [facility], [address], [place], [place] [address]    Culture  Setup Time   Final    GRAM NEGATIVE RODS IN BOTH AEROBIC AND ANAEROBIC BOTTLES Gram Stain Report Called to,Read Back By and Verified With: Edwin Dada 05/23/18 @1520  [id number]    Culture (A)  Final    ESCHERICHIA COLI SUSCEPTIBILITIES PERFORMED ON PREVIOUS CULTURE WITHIN THE LAST 5 DAYS. Performed at [facility], [place] [address], [place], [place] [address]    Report Status 05/25/2018 FINAL  Final  MRSA PCR Screening     Status: None   Collection Time: 05/23/18  8:10 AM  Result Value Ref Range Status   MRSA by PCR NEGATIVE NEGATIVE Final    Comment:        The GeneXpert MRSA Assay (FDA approved for NASAL specimens only), is one  component of a comprehensive MRSA colonization surveillance program. It is not intended to diagnose MRSA infection nor to guide or monitor treatment for MRSA infections. Performed at [facility], [address] [place], [place] [address]          Radiology Studies: No results found.      Scheduled Meds: . albuterol  2.5 mg Nebulization Q6H  . aspirin  325 mg Oral Daily  . buPROPion  300 mg Oral Daily  . feeding supplement (ENSURE ENLIVE)  237 mL Oral BID BM  . heparin  5,000 Units Subcutaneous Q8H  . insulin aspart  0-9 Units Subcutaneous TID WC  . levothyroxine  100 mcg Oral QAC breakfast  . levothyroxine  175 mcg Oral QAC breakfast  . mometasone-formoterol  2 puff Inhalation BID  . multivitamin with minerals  1 tablet Oral Daily  . nortriptyline  100 mg Oral QHS  . topiramate  200 mg Oral QHS   Continuous Infusions: . meropenem (MERREM) IV 1 g (05/25/18 1142)     LOS: 3 days    Time spent: 30 minutes    Disa Riedlinger Darleen Crocker, DO [facility] Pager [phone]  If 7PM-7AM, please contact night-coverage [email or web address] Password [email or web address] 05/25/2018, 1:12 PM

## 2018-06-04 ENCOUNTER — Ambulatory Visit: Payer: Medicare Other | Admitting: Urology

## 2018-07-07 ENCOUNTER — Other Ambulatory Visit: Payer: Self-pay

## 2018-07-07 ENCOUNTER — Observation Stay (HOSPITAL_COMMUNITY)
Admission: EM | Admit: 2018-07-07 | Discharge: 2018-07-08 | Disposition: A | Discharge status: Home / Self Care | Payer: Medicare Other | Attending: Family Medicine | Admitting: Family Medicine

## 2018-07-07 ENCOUNTER — Encounter (HOSPITAL_COMMUNITY): Payer: Self-pay | Admitting: Emergency Medicine

## 2018-07-07 DIAGNOSIS — F17210 Nicotine dependence, cigarettes, uncomplicated: Secondary | ICD-10-CM | POA: Insufficient documentation

## 2018-07-07 DIAGNOSIS — E039 Hypothyroidism, unspecified: Secondary | ICD-10-CM | POA: Diagnosis present

## 2018-07-07 DIAGNOSIS — N390 Urinary tract infection, site not specified: Secondary | ICD-10-CM | POA: Diagnosis not present

## 2018-07-07 DIAGNOSIS — F329 Major depressive disorder, single episode, unspecified: Secondary | ICD-10-CM | POA: Diagnosis not present

## 2018-07-07 DIAGNOSIS — F32A Depression, unspecified: Secondary | ICD-10-CM | POA: Diagnosis present

## 2018-07-07 DIAGNOSIS — K219 Gastro-esophageal reflux disease without esophagitis: Secondary | ICD-10-CM | POA: Diagnosis present

## 2018-07-07 DIAGNOSIS — Z7982 Long term (current) use of aspirin: Secondary | ICD-10-CM | POA: Insufficient documentation

## 2018-07-07 DIAGNOSIS — Z87442 Personal history of urinary calculi: Secondary | ICD-10-CM | POA: Diagnosis not present

## 2018-07-07 DIAGNOSIS — I10 Essential (primary) hypertension: Secondary | ICD-10-CM | POA: Insufficient documentation

## 2018-07-07 DIAGNOSIS — M545 Low back pain: Secondary | ICD-10-CM | POA: Insufficient documentation

## 2018-07-07 DIAGNOSIS — Z79890 Hormone replacement therapy (postmenopausal): Secondary | ICD-10-CM | POA: Diagnosis not present

## 2018-07-07 DIAGNOSIS — Z1612 Extended spectrum beta lactamase (ESBL) resistance: Secondary | ICD-10-CM

## 2018-07-07 DIAGNOSIS — A498 Other bacterial infections of unspecified site: Secondary | ICD-10-CM | POA: Diagnosis present

## 2018-07-07 DIAGNOSIS — N3001 Acute cystitis with hematuria: Secondary | ICD-10-CM

## 2018-07-07 DIAGNOSIS — Z2239 Carrier of other specified bacterial diseases: Secondary | ICD-10-CM

## 2018-07-07 DIAGNOSIS — Z79899 Other long term (current) drug therapy: Secondary | ICD-10-CM | POA: Diagnosis not present

## 2018-07-07 DIAGNOSIS — F419 Anxiety disorder, unspecified: Secondary | ICD-10-CM | POA: Diagnosis not present

## 2018-07-07 DIAGNOSIS — G8929 Other chronic pain: Secondary | ICD-10-CM | POA: Diagnosis not present

## 2018-07-07 DIAGNOSIS — E119 Type 2 diabetes mellitus without complications: Secondary | ICD-10-CM

## 2018-07-07 DIAGNOSIS — Z8673 Personal history of transient ischemic attack (TIA), and cerebral infarction without residual deficits: Secondary | ICD-10-CM | POA: Insufficient documentation

## 2018-07-07 DIAGNOSIS — M549 Dorsalgia, unspecified: Secondary | ICD-10-CM

## 2018-07-07 DIAGNOSIS — B9620 Unspecified Escherichia coli [E. coli] as the cause of diseases classified elsewhere: Secondary | ICD-10-CM | POA: Insufficient documentation

## 2018-07-07 DIAGNOSIS — R888 Abnormal findings in other body fluids and substances: Secondary | ICD-10-CM | POA: Diagnosis present

## 2018-07-07 DIAGNOSIS — N3000 Acute cystitis without hematuria: Secondary | ICD-10-CM

## 2018-07-07 LAB — URINALYSIS, ROUTINE W REFLEX MICROSCOPIC
Bilirubin Urine: NEGATIVE
GLUCOSE, UA: NEGATIVE mg/dL
HGB URINE DIPSTICK: NEGATIVE
KETONES UR: NEGATIVE mg/dL
Leukocytes, UA: NEGATIVE
Nitrite: NEGATIVE
PROTEIN: NEGATIVE mg/dL
Specific Gravity, Urine: 1.004 — ABNORMAL LOW (ref 1.005–1.030)
pH: 5 (ref 5.0–8.0)

## 2018-07-07 LAB — CBC WITH DIFFERENTIAL/PLATELET
Basophils Absolute: 0 10*3/uL (ref 0.0–0.1)
Basophils Relative: 0 %
EOS ABS: 0.2 10*3/uL (ref 0.0–0.7)
Eosinophils Relative: 2 %
HCT: 41.4 % (ref 36.0–46.0)
HEMOGLOBIN: 13.6 g/dL (ref 12.0–15.0)
LYMPHS ABS: 3.6 10*3/uL (ref 0.7–4.0)
LYMPHS PCT: 41 %
MCH: 30.9 pg (ref 26.0–34.0)
MCHC: 32.9 g/dL (ref 30.0–36.0)
MCV: 94.1 fL (ref 78.0–100.0)
Monocytes Absolute: 0.5 10*3/uL (ref 0.1–1.0)
Monocytes Relative: 6 %
NEUTROS PCT: 51 %
Neutro Abs: 4.4 10*3/uL (ref 1.7–7.7)
PLATELETS: 331 10*3/uL (ref 150–400)
RBC: 4.4 MIL/uL (ref 3.87–5.11)
RDW: 13.6 % (ref 11.5–15.5)
WBC: 8.7 10*3/uL (ref 4.0–10.5)

## 2018-07-07 LAB — COMPREHENSIVE METABOLIC PANEL
ALT: 21 U/L (ref 0–44)
AST: 20 U/L (ref 15–41)
Albumin: 3.8 g/dL (ref 3.5–5.0)
Alkaline Phosphatase: 79 U/L (ref 38–126)
Anion gap: 9 (ref 5–15)
BILIRUBIN TOTAL: 0.6 mg/dL (ref 0.3–1.2)
BUN: 12 mg/dL (ref 8–23)
CHLORIDE: 110 mmol/L (ref 98–111)
CO2: 20 mmol/L — ABNORMAL LOW (ref 22–32)
Calcium: 9.5 mg/dL (ref 8.9–10.3)
Creatinine, Ser: 0.97 mg/dL (ref 0.44–1.00)
GFR, EST NON AFRICAN AMERICAN: 59 mL/min — AB (ref >60)
Glucose, Bld: 114 mg/dL — ABNORMAL HIGH (ref 70–99)
Potassium: 3.7 mmol/L (ref 3.5–5.1)
Sodium: 139 mmol/L (ref 135–145)
TOTAL PROTEIN: 6.9 g/dL (ref 6.5–8.1)

## 2018-07-07 LAB — I-STAT CG4 LACTIC ACID, ED: LACTIC ACID, VENOUS: 1.39 mmol/L (ref 0.5–1.9)

## 2018-07-07 MED ORDER — ENOXAPARIN SODIUM 40 MG/0.4ML ~~LOC~~ SOLN
40.0000 mg | SUBCUTANEOUS | Status: DC
  Administered 2018-07-08: 40 mg via SUBCUTANEOUS
  Filled 2018-07-07: qty 0.4

## 2018-07-07 MED ORDER — SODIUM CHLORIDE 0.9 % IV SOLN
INTRAVENOUS | Status: AC
  Filled 2018-07-07: qty 1

## 2018-07-07 MED ORDER — LACTATED RINGERS IV BOLUS
1000.0000 mL | Freq: Once | INTRAVENOUS | Status: AC
  Administered 2018-07-07: 1000 mL via INTRAVENOUS

## 2018-07-07 MED ORDER — MOMETASONE FURO-FORMOTEROL FUM 200-5 MCG/ACT IN AERO
2.0000 | INHALATION_SPRAY | Freq: Two times a day (BID) | RESPIRATORY_TRACT | Status: DC
  Filled 2018-07-07: qty 8.8

## 2018-07-07 MED ORDER — ONDANSETRON HCL 4 MG PO TABS: 4.0000 mg | ORAL_TABLET | Freq: Four times a day (QID) | ORAL | Status: DC | PRN

## 2018-07-07 MED ORDER — OMEGA-3-ACID ETHYL ESTERS 1 G PO CAPS
1.0000 | ORAL_CAPSULE | Freq: Every day | ORAL | Status: DC
  Administered 2018-07-08: 1 g via ORAL
  Filled 2018-07-07 (×4): qty 1

## 2018-07-07 MED ORDER — DIAZEPAM 5 MG PO TABS
5.0000 mg | ORAL_TABLET | Freq: Three times a day (TID) | ORAL | Status: DC | PRN
  Administered 2018-07-08: 5 mg via ORAL
  Filled 2018-07-07: qty 1

## 2018-07-07 MED ORDER — ADULT MULTIVITAMIN W/MINERALS CH
1.0000 | ORAL_TABLET | Freq: Every day | ORAL | Status: DC
  Administered 2018-07-08: 1 via ORAL
  Filled 2018-07-07: qty 1

## 2018-07-07 MED ORDER — ZOLPIDEM TARTRATE 5 MG PO TABS
5.0000 mg | ORAL_TABLET | Freq: Every day | ORAL | Status: DC
  Administered 2018-07-08: 5 mg via ORAL
  Filled 2018-07-07: qty 1

## 2018-07-07 MED ORDER — BUPROPION HCL ER (XL) 300 MG PO TB24
300.0000 mg | ORAL_TABLET | Freq: Every day | ORAL | Status: DC
  Administered 2018-07-08: 300 mg via ORAL
  Filled 2018-07-07 (×4): qty 1

## 2018-07-07 MED ORDER — DIAZEPAM 5 MG PO TABS
5.0000 mg | ORAL_TABLET | Freq: Once | ORAL | Status: AC
  Administered 2018-07-07: 5 mg via ORAL
  Filled 2018-07-07: qty 1

## 2018-07-07 MED ORDER — HYDROCODONE-ACETAMINOPHEN 5-325 MG PO TABS
1.0000 | ORAL_TABLET | ORAL | Status: DC | PRN
  Administered 2018-07-08: 1 via ORAL
  Filled 2018-07-07: qty 1

## 2018-07-07 MED ORDER — ACETAMINOPHEN 325 MG PO TABS
650.0000 mg | ORAL_TABLET | Freq: Four times a day (QID) | ORAL | Status: DC | PRN
  Administered 2018-07-08: 650 mg via ORAL
  Filled 2018-07-07: qty 2

## 2018-07-07 MED ORDER — ASPIRIN 325 MG PO TABS
325.0000 mg | ORAL_TABLET | Freq: Every day | ORAL | Status: DC
  Administered 2018-07-08: 325 mg via ORAL
  Filled 2018-07-07: qty 1

## 2018-07-07 MED ORDER — ONDANSETRON HCL 4 MG/2ML IJ SOLN: 4.0000 mg | Freq: Four times a day (QID) | INTRAMUSCULAR | Status: DC | PRN

## 2018-07-07 MED ORDER — LACTATED RINGERS IV SOLN
INTRAVENOUS | Status: AC
  Administered 2018-07-08: 02:00:00 via INTRAVENOUS

## 2018-07-07 MED ORDER — SODIUM CHLORIDE 0.9 % IV SOLN
1.0000 g | INTRAVENOUS | Status: DC
  Administered 2018-07-08: 1000 mg via INTRAVENOUS
  Filled 2018-07-07 (×2): qty 1

## 2018-07-07 MED ORDER — LEVOTHYROXINE SODIUM 75 MCG PO TABS
175.0000 ug | ORAL_TABLET | Freq: Every day | ORAL | Status: DC
  Administered 2018-07-08: 175 ug via ORAL
  Filled 2018-07-07: qty 1

## 2018-07-07 MED ORDER — SODIUM CHLORIDE 0.9 % IV SOLN
1.0000 g | Freq: Once | INTRAVENOUS | Status: AC
  Administered 2018-07-07: 1000 mg via INTRAVENOUS
  Filled 2018-07-07: qty 1

## 2018-07-07 MED ORDER — LEVOTHYROXINE SODIUM 100 MCG PO TABS
100.0000 ug | ORAL_TABLET | Freq: Every day | ORAL | Status: DC
  Administered 2018-07-08: 100 ug via ORAL
  Filled 2018-07-07: qty 1

## 2018-07-07 MED ORDER — ACETAMINOPHEN 650 MG RE SUPP: 650.0000 mg | Freq: Four times a day (QID) | RECTAL | Status: DC | PRN

## 2018-07-07 NOTE — ED Notes (Signed)
Pt up to bathroom escorted by family and back to room

## 2018-07-07 NOTE — H&P (Addendum)
History and Physical    Courtney Orozco DOB: 07-15-1951 DOA: 07/07/2018  PCP: Galen Manila, MD   Patient coming from: Home  Chief Complaint: Weakness with cramping and dysuria, urine culture with ESBL  HPI: Courtney Orozco is a 67 y.o. female with medical history significant for anxiety, depression, osteoarthritis, chronic back pain, type 2 diabetes without complication, history of TIA, history of migraine headaches, history of gait instability, GERD, history of hepatitis B decades ago, urolithiasis, hypertension, hypothyroidism, TIA with memory loss, and recent discharge on 8/3 with ESBL E.Coli UTI and bacteremia.  She was discharged at that time with PICC line and ertapenem infusion scheduled for 14 days.  She unfortunately had a complication with her PICC line and this had to be removed on day 8, therefore she only received 7 days of treatment.  She was switched to oral clindamycin to finish the course of treatment by her PCP.  Approximately a week after finishing her clindamycin, she began to have some worsening abdominal cramping and weakness as well as dysuria for which she was placed on Augmentin on Friday with urine culture and urine analysis obtained once again.  Her urine culture resulted in ESBL once again and she was called by her PCP to come to the ED for evaluation and treatment. Patient denies any hematuria, fever, chills, chest pain, shortness of breath, nausea, vomiting or other symptoms.   ED Course: Vital signs are stable and patient is afebrile.  No leukocytosis is noted and lactic acid is 1.39.  She has been given 3 L bolus of IV fluid and has been restarted on ertapenem.  Review of Systems: All others reviewed and otherwise negative.  Past Medical History:  Diagnosis Date  . Anxiety   . Arthritis   . Back pain   . Depression   . Diabetes mellitus without complication ([place])    no meds in 5 years   . Gait instability   . GERD (gastroesophageal reflux  disease)   . Headache    hx of migraines   . Hepatitis    hx of hep B - 44 years ago   . History of kidney stones   . Hypertension   . Hypothyroidism   . Memory loss   . Pneumonia    hx of 01/2015   . PONV (postoperative nausea and vomiting)    pt states "the last two times ive been put to sleep, my oxygen drops low and they have trouble waking me up.  . Thyroid disease   . TIA (transient ischemic attack)     Past Surgical History:  Procedure Laterality Date  . ABDOMINAL HYSTERECTOMY    . APPENDECTOMY    . BACK SURGERY     x 4  . bladder tack surgery     . BREAST SURGERY Right    removal of 3 benign tumors  . CATARACT EXTRACTION W/PHACO Left 04/30/2017   Procedure: CATARACT EXTRACTION PHACO AND INTRAOCULAR LENS PLACEMENT (IOC);  Surgeon: Tonny Branch, MD;  Location: [facility];  Service: Ophthalmology;  Laterality: Left;  CDE: 7.16  . CATARACT EXTRACTION W/PHACO Right 05/14/2017   Procedure: CATARACT EXTRACTION PHACO AND INTRAOCULAR LENS PLACEMENT RIGHT EYE;  Surgeon: Tonny Branch, MD;  Location: [facility];  Service: Ophthalmology;  Laterality: Right;  CDE: 6.63  . CYSTOSCOPY WITH STENT PLACEMENT Right 09/28/2017   Procedure: CYSTOSCOPY WITH STENT PLACEMENT;  Surgeon: Franchot Gallo, MD;  Location: [facility];  Service: Urology;  Laterality: Right;  . CYSTOSCOPY  WITH URETEROSCOPY, STONE BASKETRY AND STENT PLACEMENT Right 04/06/2015   Procedure: CYSTOSCOPY WITH RIGHT URETEROSCOPY, STONE BASKETRY AND STENT PLACEMENT;  Surgeon: Irine Seal, MD;  Location: [facility];  Service: Urology;  Laterality: Right;  . CYSTOSCOPY/RETROGRADE/URETEROSCOPY Right 11/28/2017   Procedure: CYSTOSCOPY,  REMOVAL OF RIGHT URETERAL STENT, RIGHT RETROGRADE PYELOGRAM, RIGHT URETEROSCOPY, STONE BASKET EXTRACTION RIGHT URETERAL CALCULUS;  Surgeon: Cleon Gustin, MD;  Location: [facility];  Service: Urology;  Laterality: Right;  . HOLMIUM LASER APPLICATION Right 9/51/8841   Procedure: HOLMIUM LASER APPLICATION;  Surgeon: Irine Seal, MD;  Location: [facility];  Service: Urology;  Laterality: Right;  . KNEE SURGERY     left  . ROTATOR CUFF REPAIR Left   . thumb surgery     left     reports that she has been smoking cigarettes. She has a 25.00 pack-year smoking history. She has never used smokeless tobacco. She reports that she does not drink alcohol or use drugs.  Allergies  Allergen Reactions  . Ciprofloxacin Hives    Can take levaquin with no problems per patient and family  . Macrobid [Nitrofurantoin] Nausea And Vomiting  . Azithromycin Itching and Swelling  . Sulfa Antibiotics Itching    Family History  Problem Relation Age of Onset  . COPD Father     Prior to Admission medications   Medication Sig Start Date End Date Taking? Authorizing Provider  albuterol (PROVENTIL HFA;VENTOLIN HFA) 108 (90 Base) MCG/ACT inhaler Inhale 2 puffs into the lungs every 6 (six) hours as needed for wheezing or shortness of breath.   Yes [provider]  amoxicillin-clavulanate (AUGMENTIN) 875-125 MG tablet Take 1 tablet by mouth 2 (two) times daily. 10 day course starting on 07/05/2018 07/05/18  Yes [provider]  aspirin 325 MG tablet Take 325 mg by mouth daily.   Yes [provider]  buPROPion (WELLBUTRIN XL) 300 MG 24 hr tablet Take 300 mg by mouth daily.  02/10/15  Yes [provider]  diazepam (VALIUM) 5 MG tablet Take 5 mg by mouth every 8 (eight) hours as needed for anxiety or muscle spasms.  03/24/15  Yes [provider]  Flax Oil-Fish Oil-Borage Oil CAPS Take 1 capsule by mouth daily.   Yes [provider]  Fluticasone-Salmeterol (ADVAIR DISKUS) 250-50 MCG/DOSE AEPB Inhale 1 puff into the lungs daily as needed (shortness of breath).  09/17/17  Yes [provider]  HYDROcodone-acetaminophen (NORCO/VICODIN) 5-325 MG tablet Take 1 tablet by mouth every 4 (four) hours as needed for moderate pain. 11/28/17  Yes McKenzie, Candee Furbish, MD  levothyroxine (SYNTHROID,  LEVOTHROID) 100 MCG tablet Take 100 mcg by mouth daily. Take with 185mcg tablet for total dose of 284mcg daily   Yes [provider]  levothyroxine (SYNTHROID, LEVOTHROID) 175 MCG tablet Take 175 mcg by mouth daily. Take one tablet with 135mcg tablet for total dose of 268mcg   Yes [provider]  Multiple Vitamin (MULTIVITAMIN WITH MINERALS) TABS Take 1 tablet by mouth daily.   Yes [provider]  potassium chloride SA (KLOR-CON M20) 20 MEQ tablet Take 40 mEq by mouth 3 (three) times daily.    Yes [provider]  torsemide (DEMADEX) 20 MG tablet Take 10 mg by mouth every other day. In the evening 09/17/17  Yes [provider]  zolpidem (AMBIEN) 10 MG tablet Take 10 mg by mouth at bedtime.  06/14/18  Yes [provider]    Physical Exam: Vitals:   07/07/18 1833 07/07/18 1900  07/07/18 1930 07/07/18 2000  BP: 123/73 118/63 (!) 97/52 (!) 134/47  Pulse: 96 93 95 93  Resp: 17 16 19 15   Temp:      TempSrc:      SpO2: 97% 95% 95% 99%  Weight:      Height:        Constitutional: NAD, calm, comfortable Vitals:   07/07/18 1833 07/07/18 1900 07/07/18 1930 07/07/18 2000  BP: 123/73 118/63 (!) 97/52 (!) 134/47  Pulse: 96 93 95 93  Resp: 17 16 19 15   Temp:      TempSrc:      SpO2: 97% 95% 95% 99%  Weight:      Height:       Eyes: lids and conjunctivae normal ENMT: Mucous membranes are moist.  Neck: normal, supple Respiratory: clear to auscultation bilaterally. Normal respiratory effort. No accessory muscle use.  Cardiovascular: Regular rate and rhythm, no murmurs. No extremity edema. Abdomen: no tenderness, no distention. Bowel sounds positive.  Musculoskeletal:  No joint deformity upper and lower extremities.   Skin: no rashes, lesions, ulcers.  Psychiatric: Normal judgment and insight. Alert and oriented x 3. Normal mood.   Labs on Admission: I have personally reviewed following labs and imaging studies  CBC: Recent Labs  Lab  07/07/18 1804  WBC 8.7  NEUTROABS 4.4  HGB 13.6  HCT 41.4  MCV 94.1  PLT 774   Basic Metabolic Panel: Recent Labs  Lab 07/07/18 1804  NA 139  K 3.7  CL 110  CO2 20*  GLUCOSE 114*  BUN 12  CREATININE 0.97  CALCIUM 9.5   GFR: Estimated Creatinine Clearance: 58.2 mL/min (by C-G formula based on SCr of 0.97 mg/dL). Liver Function Tests: Recent Labs  Lab 07/07/18 1804  AST 20  ALT 21  ALKPHOS 79  BILITOT 0.6  PROT 6.9  ALBUMIN 3.8   No results for input(s): LIPASE, AMYLASE in the last 168 hours. No results for input(s): AMMONIA in the last 168 hours. Coagulation Profile: No results for input(s): INR, PROTIME in the last 168 hours. Cardiac Enzymes: No results for input(s): CKTOTAL, CKMB, CKMBINDEX, TROPONINI in the last 168 hours. BNP (last 3 results) No results for input(s): PROBNP in the last 8760 hours. HbA1C: No results for input(s): HGBA1C in the last 72 hours. CBG: No results for input(s): GLUCAP in the last 168 hours. Lipid Profile: No results for input(s): CHOL, HDL, LDLCALC, TRIG, CHOLHDL, LDLDIRECT in the last 72 hours. Thyroid Function Tests: No results for input(s): TSH, T4TOTAL, FREET4, T3FREE, THYROIDAB in the last 72 hours. Anemia Panel: No results for input(s): VITAMINB12, FOLATE, FERRITIN, TIBC, IRON, RETICCTPCT in the last 72 hours. Urine analysis:    Component Value Date/Time   COLORURINE STRAW (A) 07/07/2018 1950   APPEARANCEUR CLEAR 07/07/2018 1950   LABSPEC 1.004 (L) 07/07/2018 1950   PHURINE 5.0 07/07/2018 1950   GLUCOSEU NEGATIVE 07/07/2018 1950   [place] NEGATIVE 07/07/2018 1950   [place] NEGATIVE 07/07/2018 1950   [place] NEGATIVE 07/07/2018 1950   PROTEINUR NEGATIVE 07/07/2018 1950   NITRITE NEGATIVE 07/07/2018 1950   LEUKOCYTESUR NEGATIVE 07/07/2018 1950    Radiological Exams on Admission: No results found.  EKG: Independently reviewed.  Sinus rhythm 95 bpm.  Assessment/Plan Principal Problem:   UTI (urinary tract  infection) Active Problems:   GERD (gastroesophageal reflux disease)   Hypothyroidism   Type 2 diabetes mellitus (HCC)   Anxiety and depression   Chronic back pain    1. Recurrent ESBL E. coli UTI.  Unfortunately, blood cultures were not obtained prior to initiation of ertapenem in ED.  Will obtain blood cultures again to assess for the possibility of bacteremia.  PICC line placement in a.m. with set up for home health services for IV infusions to continue for full course of 14 days once again.  She is noted to have history of nephrolithiasis with noted 4 mm renal stone on ultrasound 01/2018.  This may be a nidus of infection, but with the fact that she did not finish her course of antibiotics we will plan to finish full course of treatment before pursuing this further as a potential nidus of infection. 2. Hypothyroidism.  Continue levothyroxine. 3. Type 2 diabetes.  No significant hyperglycemia noted.  Carb modified diet with SSI as needed. 4. Anxiety and depression.  Continue Wellbutrin. 5. Chronic low back pain.  Continue Norco as needed. 6. GERD.  Continue on PPI.   DVT prophylaxis: Lovenox Code Status: Full Family Communication: Husband and daughter at bedside Disposition Plan: Reinitiation of IV antibiotics and PICC line placement for treatment of ESBL UTI Consults called: None Admission status: Observation, MedSurg   Rodena Goldmann DO [facility] Pager [phone]  If 7PM-7AM, please contact night-coverage [email or web address] Password [facility]  07/07/2018, 8:41 PM

## 2018-07-07 NOTE — ED Triage Notes (Signed)
Called by physician today and per pt told to come straight to the hospital for positive ESBL   Pt reports recent sepsis, e coli and was at her 31 off ice to be checked when they did a urine and today physician called with the result

## 2018-07-07 NOTE — ED Provider Notes (Signed)
Emergency Department Provider Note   I have reviewed the triage vital signs and the nursing notes.   HISTORY  Chief Complaint Abnormal Lab   HPI Courtney Orozco is a 67 y.o. female with multiple medical problems documented below the presents to the emergency department today secondary to an abnormal lab test.  Patient states that she been feeling more rundown with dysuria suprapubic pain similar to previous episodes of UTIs.  She also worsening cramps during this time which is also consistent with previous episodes.  She is been septic and severely ill with ESBL infections in the past so her primary doctor ordered labs and a urine test and the culture apparently resulted today is ESBL again.  Patient states she still just feels little bit sleepy she has had some subjective fevers but has not checked any of them.  She does have a mild cough but no other associated symptoms. Reviewed records in care everywhere verifying same.  No other associated or modifying symptoms.    Past Medical History:  Diagnosis Date  . Anxiety   . Arthritis   . Back pain   . Depression   . Diabetes mellitus without complication ([place])    no meds in 5 years   . Gait instability   . GERD (gastroesophageal reflux disease)   . Headache    hx of migraines   . Hepatitis    hx of hep B - 44 years ago   . History of kidney stones   . Hypertension   . Hypothyroidism   . Memory loss   . Pneumonia    hx of 01/2015   . PONV (postoperative nausea and vomiting)    pt states "the last two times ive been put to sleep, my oxygen drops low and they have trouble waking me up.  . Thyroid disease   . TIA (transient ischemic attack)     Patient Active Problem List   Diagnosis Date Noted  . Sepsis secondary to UTI ([place]) 05/22/2018  . GERD (gastroesophageal reflux disease) 05/22/2018  . Hypothyroidism 05/22/2018  . Type 2 diabetes mellitus ([place]) 05/22/2018  . Anxiety and depression 05/22/2018  . HCAP  (healthcare-associated pneumonia) 05/22/2018  . Chronic back pain 05/22/2018  . Bacteremia 10/01/2017  . Acute metabolic encephalopathy 74/25/9563  . Shortness of breath 09/30/2017  . UTI (urinary tract infection) 09/28/2017  . Leukocytosis 09/28/2017  . Right ureteral stone   . Syncope 01/22/2017  . Renal calculus, right 04/07/2015  . Renal stone 04/06/2015    Past Surgical History:  Procedure Laterality Date  . ABDOMINAL HYSTERECTOMY    . APPENDECTOMY    . BACK SURGERY     x 4  . bladder tack surgery     . BREAST SURGERY Right    removal of 3 benign tumors  . CATARACT EXTRACTION W/PHACO Left 04/30/2017   Procedure: CATARACT EXTRACTION PHACO AND INTRAOCULAR LENS PLACEMENT (IOC);  Surgeon: Tonny Branch, MD;  Location: [facility];  Service: Ophthalmology;  Laterality: Left;  CDE: 7.16  . CATARACT EXTRACTION W/PHACO Right 05/14/2017   Procedure: CATARACT EXTRACTION PHACO AND INTRAOCULAR LENS PLACEMENT RIGHT EYE;  Surgeon: Tonny Branch, MD;  Location: [facility];  Service: Ophthalmology;  Laterality: Right;  CDE: 6.63  . CYSTOSCOPY WITH STENT PLACEMENT Right 09/28/2017   Procedure: CYSTOSCOPY WITH STENT PLACEMENT;  Surgeon: Franchot Gallo, MD;  Location: [facility];  Service: Urology;  Laterality: Right;  . CYSTOSCOPY WITH URETEROSCOPY, STONE BASKETRY AND STENT PLACEMENT Right 04/06/2015  Procedure: CYSTOSCOPY WITH RIGHT URETEROSCOPY, STONE BASKETRY AND STENT PLACEMENT;  Surgeon: Irine Seal, MD;  Location: [facility];  Service: Urology;  Laterality: Right;  . CYSTOSCOPY/RETROGRADE/URETEROSCOPY Right 11/28/2017   Procedure: CYSTOSCOPY,  REMOVAL OF RIGHT URETERAL STENT, RIGHT RETROGRADE PYELOGRAM, RIGHT URETEROSCOPY, STONE BASKET EXTRACTION RIGHT URETERAL CALCULUS;  Surgeon: Cleon Gustin, MD;  Location: [facility];  Service: Urology;  Laterality: Right;  . HOLMIUM LASER APPLICATION Right 11/22/8655   Procedure: HOLMIUM LASER APPLICATION;  Surgeon: Irine Seal, MD;  Location: [facility];  Service: Urology;   Laterality: Right;  . KNEE SURGERY     left  . ROTATOR CUFF REPAIR Left   . thumb surgery     left    Current Outpatient Rx  . Order #: [id number] Class: Historical Med  . Order #: [id number] Class: Historical Med  . Order #: [id number] Class: Historical Med  . Order #: [id number] Class: Historical Med  . Order #: [id number] Class: Historical Med  . Order #: [id number] Class: Historical Med  . Order #: [id number] Class: Historical Med  . Order #: [id number] Class: Print  . Order #: [id number] Class: Historical Med  . Order #: [id number] Class: Historical Med  . Order #: [id number] Class: Historical Med  . Order #: [id number] Class: Historical Med  . Order #: [id number] Class: Historical Med  . Order #: [id number] Class: Historical Med  . Order #: [id number] Class: Historical Med    Allergies Ciprofloxacin; Macrobid [nitrofurantoin]; Azithromycin; and Sulfa antibiotics  Family History  Problem Relation Age of Onset  . COPD Father     Social History Social History   Tobacco Use  . Smoking status: Current Every Day Smoker    Packs/day: 0.50    Years: 50.00    Pack years: 25.00    Types: Cigarettes  . Smokeless tobacco: Never Used  Substance Use Topics  . Alcohol use: No  . Drug use: No    Review of Systems  All other systems negative except as documented in the HPI. All pertinent positives and negatives as reviewed in the HPI. ____________________________________________   PHYSICAL EXAM:  VITAL SIGNS: ED Triage Vitals  Enc Vitals Group     BP 07/07/18 1731 (!) 138/94     Pulse Rate 07/07/18 1731 (!) 107     Resp 07/07/18 1731 18     Temp 07/07/18 1731 98.1 F (36.7 C)     Temp Source 07/07/18 1731 Oral     SpO2 07/07/18 1731 100 %     Weight 07/07/18 1731 185 lb (83.9 kg)     Height 07/07/18 1731 5\' 4"  (1.626 m)    Constitutional: Alert and oriented. Well appearing and in no acute distress. Eyes: Conjunctivae are normal. PERRL. EOMI. Head: Atraumatic. Nose: No  congestion/rhinnorhea. Mouth/Throat: Mucous membranes are moist.  Oropharynx non-erythematous. Neck: No stridor.  No meningeal signs.   Cardiovascular: Normal rate, regular rhythm. Good peripheral circulation. Grossly normal heart sounds.   Respiratory: Normal respiratory effort.  No retractions. Lungs CTAB. Gastrointestinal: Soft and mild suprapubic ttp. No distention.  Musculoskeletal: No lower extremity tenderness nor edema. No gross deformities of extremities. Neurologic:  Normal speech and language. No gross focal neurologic deficits are appreciated.  Skin:  Skin is warm, dry and intact. No rash noted.   ____________________________________________   LABS (all labs ordered are listed, but only abnormal results are displayed)  Labs Reviewed  CULTURE, BLOOD (ROUTINE X 2)  CULTURE, BLOOD (ROUTINE X 2)  COMPREHENSIVE METABOLIC PANEL  CBC WITH DIFFERENTIAL/PLATELET  URINALYSIS, ROUTINE W REFLEX MICROSCOPIC  I-STAT CG4 LACTIC ACID, ED  ____________________________________________  EKG   EKG Interpretation  Date/Time:    Ventricular Rate:    PR Interval:    QRS Duration:   QT Interval:    QTC Calculation:   R Axis:     Text Interpretation:         ____________________________________________  RADIOLOGY  No results found.  ____________________________________________   PROCEDURES  Procedure(s) performed:   Procedures   ____________________________________________   INITIAL IMPRESSION / ASSESSMENT AND PLAN / ED COURSE  Initiate treatment for ESBL UTI.  Will need to be admitted for IV antibiotics.  Ertapenem given. D/w medicine, will admit.     Pertinent labs & imaging results that were available during my care of the patient were reviewed by me and considered in my medical decision making (see chart for details).  ____________________________________________  FINAL CLINICAL IMPRESSION(S) / ED DIAGNOSES  Final diagnoses:  None      MEDICATIONS GIVEN DURING THIS VISIT:  Medications  lactated ringers bolus 1,000 mL (has no administration in time range)  lactated ringers bolus 1,000 mL (has no administration in time range)  ertapenem (INVANZ) 1,000 mg in sodium chloride 0.9 % 100 mL IVPB (has no administration in time range)     NEW OUTPATIENT MEDICATIONS STARTED DURING THIS VISIT:  New Prescriptions   No medications on file    Note:  This note was prepared with assistance of Dragon voice recognition software. Occasional wrong-word or sound-a-like substitutions may have occurred due to the inherent limitations of voice recognition software.   Merrily Pew, MD 07/07/18 2046

## 2018-07-07 NOTE — ED Notes (Signed)
Dr. Shah at bedside.

## 2018-07-08 ENCOUNTER — Other Ambulatory Visit: Payer: Self-pay

## 2018-07-08 ENCOUNTER — Encounter (HOSPITAL_COMMUNITY): Payer: Self-pay | Admitting: *Deleted

## 2018-07-08 DIAGNOSIS — N3001 Acute cystitis with hematuria: Secondary | ICD-10-CM | POA: Diagnosis not present

## 2018-07-08 DIAGNOSIS — A498 Other bacterial infections of unspecified site: Secondary | ICD-10-CM | POA: Diagnosis present

## 2018-07-08 DIAGNOSIS — Z1612 Extended spectrum beta lactamase (ESBL) resistance: Secondary | ICD-10-CM

## 2018-07-08 LAB — BASIC METABOLIC PANEL
ANION GAP: 5 (ref 5–15)
BUN: 13 mg/dL (ref 8–23)
CHLORIDE: 113 mmol/L — AB (ref 98–111)
CO2: 25 mmol/L (ref 22–32)
Calcium: 9 mg/dL (ref 8.9–10.3)
Creatinine, Ser: 1.09 mg/dL — ABNORMAL HIGH (ref 0.44–1.00)
GFR calc Af Amer: 59 mL/min — ABNORMAL LOW (ref >60)
GFR calc non Af Amer: 51 mL/min — ABNORMAL LOW (ref >60)
GLUCOSE: 117 mg/dL — AB (ref 70–99)
POTASSIUM: 4.2 mmol/L (ref 3.5–5.1)
Sodium: 143 mmol/L (ref 135–145)

## 2018-07-08 LAB — CBC
HEMATOCRIT: 38.5 % (ref 36.0–46.0)
HEMOGLOBIN: 12.1 g/dL (ref 12.0–15.0)
MCH: 30 pg (ref 26.0–34.0)
MCHC: 31.4 g/dL (ref 30.0–36.0)
MCV: 95.5 fL (ref 78.0–100.0)
Platelets: 271 10*3/uL (ref 150–400)
RBC: 4.03 MIL/uL (ref 3.87–5.11)
RDW: 13.6 % (ref 11.5–15.5)
WBC: 6.4 10*3/uL (ref 4.0–10.5)

## 2018-07-08 MED ORDER — PNEUMOCOCCAL VAC POLYVALENT 25 MCG/0.5ML IJ INJ: 0.5000 mL | INJECTION | INTRAMUSCULAR | Status: DC

## 2018-07-08 MED ORDER — POTASSIUM CHLORIDE CRYS ER 20 MEQ PO TBCR: 40.0000 meq | EXTENDED_RELEASE_TABLET | Freq: Two times a day (BID) | ORAL | 0 refills | Status: DC

## 2018-07-08 MED ORDER — SODIUM CHLORIDE 0.9% FLUSH: 10.0000 mL | INTRAVENOUS | Status: DC | PRN

## 2018-07-08 MED ORDER — ENSURE ENLIVE PO LIQD: 237.0000 mL | Freq: Two times a day (BID) | ORAL | Status: DC

## 2018-07-08 MED ORDER — SODIUM CHLORIDE 0.9% FLUSH
10.0000 mL | Freq: Two times a day (BID) | INTRAVENOUS | Status: DC
  Administered 2018-07-08: 10 mL

## 2018-07-08 MED ORDER — INFLUENZA VAC SPLIT HIGH-DOSE 0.5 ML IM SUSY: 0.5000 mL | PREFILLED_SYRINGE | INTRAMUSCULAR | Status: DC

## 2018-07-08 NOTE — Progress Notes (Signed)
PHARMACY CONSULT NOTE FOR:  OUTPATIENT  PARENTERAL ANTIBIOTIC THERAPY (OPAT)  Indication: E.Coli ESBL bacteremia Regimen: Invanz 1gm IV q24 End date: 07/14/2018  IV antibiotic discharge orders are pended. To discharging provider:  please sign these orders via discharge navigator,  Select New Orders & click on the button choice - Manage This Unsigned Work.     Thank you for allowing pharmacy to be a part of this patient's care.  Isac Sarna L 07/08/2018, 3:30 PM

## 2018-07-08 NOTE — Progress Notes (Signed)
Patient discharged home.  IVs removed - WNL.  PICC left in place for home Abx administration and WNL.  Reviewed AVS- verbalizes understanding.  No questions at this time.  HH arranged per CM and [facility].  Patient in NAD at this time

## 2018-07-08 NOTE — Discharge Summary (Signed)
Courtney Orozco, is a 67 y.o. female  DOB [birth year]  MRN [id number].  Admission date:  07/07/2018  Admitting Physician  [place], DO  Discharge Date:  07/08/2018   Primary MD  Courtney Manila, MD  Recommendations for primary care physician for things to follow:   1)You have E coli ESBL urinary tract infection and Recent E coli ESBL Bacteremia---  2) you need the antibiotic called Invanz 1 g daily for 7 days iv thru [facility]c [facility] , first dose 07/08/18 , last dose 07/14/18  3)CBC and BMP on 07/11/18 and 07/14/18 to be Faxed to PCP--- Dr. Galen Orozco at Fax [phone] in [place], [place]  4) home health nurse will call your primary care physician Dr. Geradine Orozco around 07/15/2018 to verify that he wants the PICC line removed prior to removing a PICC line after completion of IV antibiotics  Admission Diagnosis  Acute cystitis without hematuria [N30.00] ESBL E. coli carrier [Z22.39]  Discharge Diagnosis  Acute cystitis without hematuria [N30.00] ESBL E. coli carrier [Z22.39]    Principal Problem:   Infection due to ESBL-producing Escherichia coli Active Problems:   ESBL E Coli- UTI (urinary tract infection)   GERD (gastroesophageal reflux disease)   Hypothyroidism   Type 2 diabetes mellitus ([place])   Anxiety and depression   Chronic back pain      Past Medical History:  Diagnosis Date  . Anxiety   . Arthritis   . Back pain   . Depression   . Diabetes mellitus without complication ([place])    no meds in 5 years   . Gait instability   . GERD (gastroesophageal reflux disease)   . Headache    hx of migraines   . Hepatitis    hx of hep B - 44 years ago   . History of kidney stones   . Hypertension   . Hypothyroidism   . Memory loss   . Pneumonia    hx of 01/2015   . PONV (postoperative nausea and vomiting)    pt states "the last two times ive been put to sleep, my  oxygen drops low and they have trouble waking me up.  . Thyroid disease   . TIA (transient ischemic attack)     Past Surgical History:  Procedure Laterality Date  . ABDOMINAL HYSTERECTOMY    . APPENDECTOMY    . BACK SURGERY     x 4  . bladder tack surgery     . BREAST SURGERY Right    removal of 3 benign tumors  . CATARACT EXTRACTION W/PHACO Left 04/30/2017   Procedure: CATARACT EXTRACTION PHACO AND INTRAOCULAR LENS PLACEMENT (IOC);  Surgeon: Tonny Branch, MD;  Location: [facility];  Service: Ophthalmology;  Laterality: Left;  CDE: 7.16  . CATARACT EXTRACTION W/PHACO Right 05/14/2017   Procedure: CATARACT EXTRACTION PHACO AND INTRAOCULAR LENS PLACEMENT RIGHT EYE;  Surgeon: Tonny Branch, MD;  Location: [facility];  Service: Ophthalmology;  Laterality: Right;  CDE: 6.63  . CYSTOSCOPY WITH  STENT PLACEMENT Right 09/28/2017   Procedure: CYSTOSCOPY WITH STENT PLACEMENT;  Surgeon: Franchot Gallo, MD;  Location: [facility];  Service: Urology;  Laterality: Right;  . CYSTOSCOPY WITH URETEROSCOPY, STONE BASKETRY AND STENT PLACEMENT Right 04/06/2015   Procedure: CYSTOSCOPY WITH RIGHT URETEROSCOPY, STONE BASKETRY AND STENT PLACEMENT;  Surgeon: Irine Seal, MD;  Location: [facility];  Service: Urology;  Laterality: Right;  . CYSTOSCOPY/RETROGRADE/URETEROSCOPY Right 11/28/2017   Procedure: CYSTOSCOPY,  REMOVAL OF RIGHT URETERAL STENT, RIGHT RETROGRADE PYELOGRAM, RIGHT URETEROSCOPY, STONE BASKET EXTRACTION RIGHT URETERAL CALCULUS;  Surgeon: Cleon Gustin, MD;  Location: [facility];  Service: Urology;  Laterality: Right;  . HOLMIUM LASER APPLICATION Right 7/82/9562   Procedure: HOLMIUM LASER APPLICATION;  Surgeon: Irine Seal, MD;  Location: [facility];  Service: Urology;  Laterality: Right;  . KNEE SURGERY     left  . ROTATOR CUFF REPAIR Left   . thumb surgery     left     HPI  from the history and physical done on the day of admission:    Patient coming from: Home  Chief Complaint: Weakness with cramping and dysuria,  urine culture with ESBL  HPI: Courtney Orozco is a 67 y.o. female with medical history significant for anxiety, depression, osteoarthritis, chronic back pain, type 2 diabetes without complication, history of TIA, history of migraine headaches, history of gait instability, GERD, history of hepatitis B decades ago, urolithiasis, hypertension, hypothyroidism, TIA with memory loss, and recent discharge on 8/3 with ESBL E.Coli UTI and bacteremia.  She was discharged at that time with PICC line and ertapenem infusion scheduled for 14 days.  She unfortunately had a complication with her PICC line and this had to be removed on day 8, therefore she only received 7 days of treatment.  She was switched to oral clindamycin to finish the course of treatment by her PCP.  Approximately a week after finishing her clindamycin, she began to have some worsening abdominal cramping and weakness as well as dysuria for which she was placed on Augmentin on Friday with urine culture and urine analysis obtained once again.  Her urine culture resulted in ESBL once again and she was called by her PCP to come to the ED for evaluation and treatment. Patient denies any hematuria, fever, chills, chest pain, shortness of breath, nausea, vomiting or other symptoms.   ED Course: Vital signs are stable and patient is afebrile.  No leukocytosis is noted and lactic acid is 1.39.  She has been given 3 L bolus of IV fluid and has been restarted on ertapenem.   Hospital Course:     1)Recurrent ESBL E. coli UTI--- review of records in care everywhere at [facility] clinic reveals ESBL E. coli infection per urine culture from 07/05/2018, I called and Discussed case pt's  PCP Dr. Galen Orozco at [facility] in [place], [place] who advised PICC line placement for IV Invanz infusions daily for 7 days.  According to clinical course patient has history of recurrent E coli ESBL urinary tract infection and Recent E coli ESBL Bacteremia, treat  with Invanz 1 g daily for 7 days iv thru Picc Line , first dose 07/08/18 , last dose 07/14/18.  Home health RN to do IV infusions through PICC line at home for patient , CBC and BMP on 07/11/18 and 07/14/18 to be Faxed to PCP--- Dr. Galen Orozco at Fax [phone] in [place], [place], patient will follow-up with PCP  2) hypothyroidism--- continue levothyroxine 275 mcg daily  3) anxiety and depression--stable, continue Wellbutrin  Discharge Condition: stable  Follow UP  Follow-up Information    [facility] Follow up.   Why:  RN and IV Antibiotics -have requested a different nurse Contact information: [address] [place] [address] [phone]          Consults obtained - Phone consult with patient's PCP Dr. Gwyndolyn Saxon B. Lewis  Diet and Activity recommendation:  As advised  Discharge Instructions    Discharge Instructions    Call MD for:  difficulty breathing, headache or visual disturbances   Complete by:  As directed    Call MD for:  persistant dizziness or light-headedness   Complete by:  As directed    Call MD for:  persistant nausea and vomiting   Complete by:  As directed    Call MD for:  severe uncontrolled pain   Complete by:  As directed    Call MD for:  temperature >100.4   Complete by:  As directed    Diet - low sodium heart healthy   Complete by:  As directed    Discharge instructions   Complete by:  As directed    1)You have E coli ESBL urinary tract infection and Recent E coli ESBL Bacteremia---  2) you need the antibiotic called Invanz 1 g daily for 7 days iv thru Picc Line , first dose 07/08/18 , last dose 07/14/18  3)CBC and BMP on 07/11/18 and 07/14/18 to be Faxed to PCP--- Dr. Galen Orozco at Fax [phone] in [place], [place]  4) home health nurse will call your primary care physician Dr. Geradine Orozco around 07/15/2018 to verify that he wants the PICC line removed prior to  removing a PICC line after completion of IV antibiotics   Increase activity slowly   Complete by:  As directed         Discharge Medications     Allergies as of 07/08/2018      Reactions   Ciprofloxacin Hives   Can take levaquin with no problems per patient and family   Macrobid [nitrofurantoin] Nausea And Vomiting   Azithromycin Itching, Swelling   Sulfa Antibiotics Itching      Medication List    STOP taking these medications   amoxicillin-clavulanate 875-125 MG tablet Commonly known as:  AUGMENTIN     TAKE these medications   ADVAIR DISKUS 250-50 MCG/DOSE Aepb Generic drug:  Fluticasone-Salmeterol Inhale 1 puff into the lungs daily as needed (shortness of breath).   albuterol 108 (90 Base) MCG/ACT inhaler Commonly known as:  PROVENTIL HFA;VENTOLIN HFA Inhale 2 puffs into the lungs every 6 (six) hours as needed for wheezing or shortness of breath.   aspirin 325 MG tablet Take 325 mg by mouth daily.   buPROPion 300 MG 24 hr tablet Commonly known as:  WELLBUTRIN XL Take 300 mg by mouth daily.   diazepam 5 MG tablet Commonly known as:  VALIUM Take 5 mg by mouth every 8 (eight) hours as needed for anxiety or muscle spasms.   Flax Oil-Fish Oil-Borage Oil Caps Take 1 capsule by mouth daily.   HYDROcodone-acetaminophen 5-325 MG tablet Commonly known as:  NORCO/VICODIN Take 1 tablet by mouth every 4 (four) hours as needed for moderate pain.   levothyroxine 100 MCG tablet Commonly known as:  SYNTHROID, LEVOTHROID Take 100 mcg by mouth daily. Take with 116mcg tablet for total dose of 248mcg daily   levothyroxine 175 MCG tablet Commonly known as:  SYNTHROID, LEVOTHROID Take 175 mcg by mouth daily. Take one tablet with 137mcg tablet for total dose of 245mcg   multivitamin with minerals Tabs tablet Take 1 tablet by mouth daily.   potassium chloride SA 20 MEQ tablet Commonly known as:  K-DUR,KLOR-CON Take 2 tablets (40 mEq total) by mouth 2 (two) times  daily. What changed:  when to take this   torsemide 20 MG tablet Commonly known as:  DEMADEX Take 10 mg by mouth every other day. In the evening   zolpidem 10 MG tablet Commonly known as:  AMBIEN Take 10 mg by mouth at bedtime.       Major procedures and Radiology Reports - PLEASE review detailed and final reports for all details, in brief -    No results found.  Micro Results   Recent Results (from the past 240 hour(s))  Blood Culture (routine x 2)     Status: None (Preliminary result)   Collection Time: 07/07/18  6:04 PM  Result Value Ref Range Status   Specimen Description RIGHT ANTECUBITAL  Final   Special Requests   Final    BOTTLES DRAWN AEROBIC AND ANAEROBIC Blood Culture adequate volume   Culture   Final    NO GROWTH < 24 HOURS Performed at [facility], [address] [place], [place] [address]    Report Status PENDING  Incomplete  Blood Culture (routine x 2)     Status: None (Preliminary result)   Collection Time: 07/07/18  6:32 PM  Result Value Ref Range Status   Specimen Description BLOOD RIGHT WRIST DRAWN BY RN  Final   Special Requests   Final    BOTTLES DRAWN AEROBIC AND ANAEROBIC Blood Culture adequate volume   Culture   Final    NO GROWTH < 24 HOURS Performed at [facility], [address], [place], [place] [address]    Report Status PENDING  Incomplete       Today   Subjective    Macyn Shro[place] today has no new signs, no flank pain no fever no chills no nausea no vomiting  However patient complains of some urinary frequency and dysuria   Additional history obtained from patient's husband at bedside, questions answered   Patient has been seen and examined prior to discharge   Objective   Blood pressure 133/62, pulse 85, temperature 98.1 F (36.7 C), temperature source Oral, resp. rate 16, height 5\' 4"  (1.626 m), weight 88.6 kg, SpO2 98 %.   Intake/Output Summary (Last 24 hours) at 07/08/2018 1422 Last data filed at 07/07/2018  2134 Gross per 24 hour  Intake 3100 ml  Output -  Net 3100 ml    Exam Gen:- Awake Alert,  In no apparent distress  HEENT:- [place].AT, No sclera icterus Neck-Supple Neck,No JVD,.  Lungs-  CTAB , good air movement CV- S1, S2 normal, regular Abd-  +ve B.Sounds, Abd Soft, No tenderness, no CVA tenderness Extremity/Skin:- No  edema,   good pulses Psych-affect is appropriate, oriented x3 Neuro-no new focal deficits, no tremors   Data Review   CBC w Diff:  Lab Results  Component Value Date   WBC 6.4 07/08/2018   HGB 12.1 07/08/2018   HCT 38.5 07/08/2018   PLT 271 07/08/2018   LYMPHOPCT 41 07/07/2018   MONOPCT 6 07/07/2018   EOSPCT 2 07/07/2018   BASOPCT 0 07/07/2018    CMP:  Lab Results  Component Value Date   NA 143 07/08/2018   K 4.2 07/08/2018   CL 113 (  H) 07/08/2018   CO2 25 07/08/2018   BUN 13 07/08/2018   CREATININE 1.09 (H) 07/08/2018   PROT 6.9 07/07/2018   ALBUMIN 3.8 07/07/2018   BILITOT 0.6 07/07/2018   ALKPHOS 79 07/07/2018   AST 20 07/07/2018   ALT 21 07/07/2018  .   Total Discharge time is about 33 minutes  Roxan Hockey M.D on 07/08/2018 at 2:22 PM  Pager---[phone]  Go to [email or web address] - password [email or web address] for contact info  [facility] - Office  [phone]

## 2018-07-08 NOTE — Care Management Note (Addendum)
Case Management Note  Patient Details  Name: Courtney Orozco MRN: [id number] Date of Birth: 10/05/1951  Subjective/Objective:   urine culture with ESBL. From home with husband, independent. Recent IV antibiotics at home. Will need again. Would like to use [facility], but do not want to have Nurse Angie this time. Husband and daughters assist with IV administrations. PICC to be placed today.                  Action/Plan: DC home with home health. Vaughan Basta of [facility] notified and will obtain orders when available. Attending to place OPAT.  ADDENDUM: Patient to have IV antibiotic dose today. SOC for [facility] will be tomorrow.   Expected Discharge Date:    07/08/2018              Expected Discharge Plan:  [place]  In-House Referral:  Chaplain  Discharge planning Services  CM Consult  Post Acute Care Choice:  Home Health Choice offered to:  Patient  DME Arranged:    DME Agency:     HH Arranged:  RN, IV Antibiotics HH Agency:  [place]  Status of Service:  Completed, signed off  If discussed at [place] of Stay Meetings, dates discussed:    Additional Comments:  Duwan Adrian, Chauncey [place], RN 07/08/2018, 11:46 AM

## 2018-07-08 NOTE — ED Notes (Signed)
Pt placed in hospital bed, as we are holding pt for admission in ED

## 2018-07-08 NOTE — Discharge Instructions (Signed)
1)You have E coli ESBL urinary tract infection and Recent E coli ESBL Bacteremia---  2) you need the antibiotic called Invanz 1 g daily for 7 days iv thru Picc Line , first dose 07/08/18 , last dose 07/14/18  3)CBC and BMP on 07/11/18 and 07/14/18 to be Faxed to PCP--- Dr. Galen Manila at Fax [phone] in [place], [place]  4) home health nurse will call your primary care physician Dr. Geradine Girt around 07/15/2018 to verify that he wants the PICC line removed prior to removing a PICC line after completion of IV antibiotics

## 2018-07-12 LAB — CULTURE, BLOOD (ROUTINE X 2)
Culture: NO GROWTH
Culture: NO GROWTH
Special Requests: ADEQUATE
Special Requests: ADEQUATE

## 2018-07-31 ENCOUNTER — Other Ambulatory Visit: Payer: Self-pay | Admitting: Urology

## 2018-07-31 ENCOUNTER — Ambulatory Visit (INDEPENDENT_AMBULATORY_CARE_PROVIDER_SITE_OTHER): Payer: PPO | Admitting: Urology

## 2018-07-31 ENCOUNTER — Ambulatory Visit (HOSPITAL_COMMUNITY)
Admission: RE | Admit: 2018-07-31 | Discharge: 2018-07-31 | Disposition: A | Discharge status: Home / Self Care | Payer: PPO | Source: Ambulatory Visit | Attending: Urology | Admitting: Urology

## 2018-07-31 DIAGNOSIS — N8110 Cystocele, unspecified: Secondary | ICD-10-CM | POA: Diagnosis not present

## 2018-07-31 DIAGNOSIS — N8189 Other female genital prolapse: Secondary | ICD-10-CM | POA: Diagnosis not present

## 2018-07-31 DIAGNOSIS — Z09 Encounter for follow-up examination after completed treatment for conditions other than malignant neoplasm: Secondary | ICD-10-CM | POA: Diagnosis not present

## 2018-07-31 DIAGNOSIS — Z87442 Personal history of urinary calculi: Secondary | ICD-10-CM | POA: Insufficient documentation

## 2018-07-31 DIAGNOSIS — K5730 Diverticulosis of large intestine without perforation or abscess without bleeding: Secondary | ICD-10-CM | POA: Insufficient documentation

## 2018-07-31 DIAGNOSIS — I714 Abdominal aortic aneurysm, without rupture: Secondary | ICD-10-CM | POA: Insufficient documentation

## 2018-07-31 DIAGNOSIS — R109 Unspecified abdominal pain: Secondary | ICD-10-CM | POA: Diagnosis not present

## 2018-07-31 DIAGNOSIS — N200 Calculus of kidney: Secondary | ICD-10-CM

## 2018-07-31 DIAGNOSIS — N3000 Acute cystitis without hematuria: Secondary | ICD-10-CM | POA: Diagnosis not present

## 2018-08-13 ENCOUNTER — Encounter: Payer: Self-pay | Admitting: General Surgery

## 2018-08-13 ENCOUNTER — Ambulatory Visit (INDEPENDENT_AMBULATORY_CARE_PROVIDER_SITE_OTHER): Payer: PPO | Admitting: General Surgery

## 2018-08-13 VITALS — BP 151/71 | HR 89 | Temp 97.5°F | Resp 18 | Wt 186.0 lb

## 2018-08-13 DIAGNOSIS — K8020 Calculus of gallbladder without cholecystitis without obstruction: Secondary | ICD-10-CM | POA: Diagnosis not present

## 2018-08-13 NOTE — Patient Instructions (Signed)
Laparoscopic Cholecystectomy Laparoscopic cholecystectomy is surgery to remove the gallbladder. The gallbladder is a pear-shaped organ that lies beneath the liver on the right side of the body. The gallbladder stores bile, which is a fluid that helps the body to digest fats. Cholecystectomy is often done for inflammation of the gallbladder (cholecystitis). This condition is usually caused by a buildup of gallstones (cholelithiasis) in the gallbladder. Gallstones can block the flow of bile, which can result in inflammation and pain. In severe cases, emergency surgery may be required. This procedure is done though small incisions in your abdomen (laparoscopic surgery). A thin scope with a camera (laparoscope) is inserted through one incision. Thin surgical instruments are inserted through the other incisions. In some cases, a laparoscopic procedure may be turned into a type of surgery that is done through a larger incision (open surgery). Tell a health care provider about:  Any allergies you have.  All medicines you are taking, including vitamins, herbs, eye drops, creams, and over-the-counter medicines.  Any problems you or family members have had with anesthetic medicines.  Any blood disorders you have.  Any surgeries you have had.  Any medical conditions you have.  Whether you are pregnant or may be pregnant. What are the risks? Generally, this is a safe procedure. However, problems may occur, including:  Infection.  Bleeding.  Allergic reactions to medicines.  Damage to other structures or organs.  A stone remaining in the common bile duct. The common bile duct carries bile from the gallbladder into the small intestine.  A bile leak from the cyst duct that is clipped when your gallbladder is removed.    Medicines  Ask your health care provider about: ? Changing or stopping your regular medicines. This is especially important if you are taking diabetes medicines or blood  thinners. ? Taking medicines such as aspirin and ibuprofen. These medicines can thin your blood. Do not take these medicines before your procedure if your health care provider instructs you not to.  You may be given antibiotic medicine to help prevent infection. General instructions  Let your health care provider know if you develop a cold or an infection before surgery.  Plan to have someone take you home from the hospital or clinic.  Ask your health care provider how your surgical site will be marked or identified. What happens during the procedure?  To reduce your risk of infection: ? Your health care team will wash or sanitize their hands. ? Your skin will be washed with soap. ? Hair may be removed from the surgical area.  An IV tube may be inserted into one of your veins.  You will be given one or more of the following: ? A medicine to help you relax (sedative). ? A medicine to make you fall asleep (general anesthetic).  A breathing tube will be placed in your mouth.  Your surgeon will make several small cuts (incisions) in your abdomen.  The laparoscope will be inserted through one of the small incisions. The camera on the laparoscope will send images to a TV screen (monitor) in the operating room. This lets your surgeon see inside your abdomen.  Air-like gas will be pumped into your abdomen. This will expand your abdomen to give the surgeon more room to perform the surgery.  Other tools that are needed for the procedure will be inserted through the other incisions. The gallbladder will be removed through one of the incisions.  Your common bile duct may be examined. If stones   are found in the common bile duct, they may be removed.  After your gallbladder has been removed, the incisions will be closed with stitches (sutures), staples, or skin glue.  Your incisions may be covered with a bandage (dressing). The procedure may vary among health care providers and  hospitals. What happens after the procedure?  Your blood pressure, heart rate, breathing rate, and blood oxygen level will be monitored until the medicines you were given have worn off.  You will be given medicines as needed to control your pain.  Do not drive for 24 hours if you were given a sedative. This information is not intended to replace advice given to you by your health care provider. Make sure you discuss any questions you have with your health care provider. Document Released: 10/09/2005 Document Revised: 04/30/2016 Document Reviewed: 03/27/2016 Elsevier Interactive Patient Education  2018 [employer]  

## 2018-08-13 NOTE — Progress Notes (Signed)
[facility] History and Physical  Reason for Referral: Gallbladder Referring Physician: Dr. Alyson Ingles (Urologist)   Chief Complaint    Abdominal Pain      Courtney Orozco is a 67 y.o. female.  HPI: Courtney Orozco is a 67 yo with chronic history of kidney stones and repeated UTI with E coli thought to be secondary to her having lodged kidney stones.  She has had recent hospitalizations back in the summer for the UTIs and required PICC line and IV antibiotics to clear the infection.  She says that she has suffered from this pain for years, and that she has had right and left sided kidney stones. She was not aware of possibly having some of the pain being related to her gallbladder until seeing Dr. Alyson Ingles.  She says that the right sided pain she always attributed to her kidney stones. She says that she has some nausea/ indigestion and that she does not vomit. She has lost some weight but reports that this is more related to diet and need to take in large quantities of water. She says that a lot of her symptoms occur when she goes to [facility].   She has cared for her parents for some time, and feels that she has neglected her own health. She walks with a walker due to some falls she says are related to right sided weakness. She says that she had MRIs in the past that questioned TIAs that she could have suffered from remotely.  She denies any recent lateral weakness, facial drooping, etc.  She has never had any heart attacks or chest pain.  She does report a remote history of Hepatitis B and ? Colitis that was treated in [place], [place].  Past Medical History:  Diagnosis Date  . Anxiety   . Arthritis   . Back pain   . Depression   . Diabetes mellitus without complication ([place])    no meds in 5 years   . Gait instability   . GERD (gastroesophageal reflux disease)   . Headache    hx of migraines   . Hepatitis    hx of hep B - 44 years ago   . History of kidney stones   .  Hypertension   . Hypothyroidism   . Memory loss   . Pneumonia    hx of 01/2015   . PONV (postoperative nausea and vomiting)    pt states "the last two times ive been put to sleep, my oxygen drops low and they have trouble waking me up.  . Thyroid disease   . TIA (transient ischemic attack)     Past Surgical History:  Procedure Laterality Date  . ABDOMINAL HYSTERECTOMY    . APPENDECTOMY    . BACK SURGERY     x 4  . bladder tack surgery     . BREAST SURGERY Right    removal of 3 benign tumors  . CATARACT EXTRACTION W/PHACO Left 04/30/2017   Procedure: CATARACT EXTRACTION PHACO AND INTRAOCULAR LENS PLACEMENT (IOC);  Surgeon: Tonny Branch, MD;  Location: [facility];  Service: Ophthalmology;  Laterality: Left;  CDE: 7.16  . CATARACT EXTRACTION W/PHACO Right 05/14/2017   Procedure: CATARACT EXTRACTION PHACO AND INTRAOCULAR LENS PLACEMENT RIGHT EYE;  Surgeon: Tonny Branch, MD;  Location: [facility];  Service: Ophthalmology;  Laterality: Right;  CDE: 6.63  . CYSTOSCOPY WITH STENT PLACEMENT Right 09/28/2017   Procedure: CYSTOSCOPY WITH STENT PLACEMENT;  Surgeon: Franchot Gallo, MD;  Location: [facility];  Service: Urology;  Laterality: Right;  . CYSTOSCOPY WITH URETEROSCOPY, STONE BASKETRY AND STENT PLACEMENT Right 04/06/2015   Procedure: CYSTOSCOPY WITH RIGHT URETEROSCOPY, STONE BASKETRY AND STENT PLACEMENT;  Surgeon: Irine Seal, MD;  Location: [facility];  Service: Urology;  Laterality: Right;  . CYSTOSCOPY/RETROGRADE/URETEROSCOPY Right 11/28/2017   Procedure: CYSTOSCOPY,  REMOVAL OF RIGHT URETERAL STENT, RIGHT RETROGRADE PYELOGRAM, RIGHT URETEROSCOPY, STONE BASKET EXTRACTION RIGHT URETERAL CALCULUS;  Surgeon: Cleon Gustin, MD;  Location: [facility];  Service: Urology;  Laterality: Right;  . HOLMIUM LASER APPLICATION Right 3/82/5053   Procedure: HOLMIUM LASER APPLICATION;  Surgeon: Irine Seal, MD;  Location: [facility];  Service: Urology;  Laterality: Right;  . KNEE SURGERY     left  . ROTATOR CUFF REPAIR Left     . thumb surgery     left    Family History  Problem Relation Age of Onset  . COPD Father     Social History   Tobacco Use  . Smoking status: Current Every Day Smoker    Packs/day: 0.50    Years: 50.00    Pack years: 25.00    Types: Cigarettes  . Smokeless tobacco: Never Used  Substance Use Topics  . Alcohol use: No  . Drug use: No    Medications: I have reviewed the patient's current medications. Allergies as of 08/13/2018      Reactions   Ciprofloxacin Hives   Can take levaquin with no problems per patient and family   Macrobid [nitrofurantoin] Nausea And Vomiting   Azithromycin Itching, Swelling   Sulfa Antibiotics Itching      Medication List        Accurate as of 08/13/18 10:53 AM. Always use your most recent med list.          ADVAIR DISKUS 250-50 MCG/DOSE Aepb Generic drug:  Fluticasone-Salmeterol Inhale 1 puff into the lungs daily as needed (shortness of breath).   albuterol 108 (90 Base) MCG/ACT inhaler Commonly known as:  PROVENTIL HFA;VENTOLIN HFA Inhale 2 puffs into the lungs every 6 (six) hours as needed for wheezing or shortness of breath.   aspirin 325 MG tablet Take 325 mg by mouth daily.   buPROPion 300 MG 24 hr tablet Commonly known as:  WELLBUTRIN XL Take 300 mg by mouth daily.   diazepam 5 MG tablet Commonly known as:  VALIUM Take 5 mg by mouth every 8 (eight) hours as needed for anxiety or muscle spasms.   Flax Oil-Fish Oil-Borage Oil Caps Take 1 capsule by mouth daily.   HYDROcodone-acetaminophen 5-325 MG tablet Commonly known as:  NORCO/VICODIN Take 1 tablet by mouth every 4 (four) hours as needed for moderate pain.   levothyroxine 100 MCG tablet Commonly known as:  SYNTHROID, LEVOTHROID Take 100 mcg by mouth daily. Take with 127mcg tablet for total dose of 264mcg daily   levothyroxine 175 MCG tablet Commonly known as:  SYNTHROID, LEVOTHROID Take 175 mcg by mouth daily. Take one tablet with 124mcg tablet for total dose  of 234mcg   multivitamin with minerals Tabs tablet Take 1 tablet by mouth daily.   potassium chloride SA 20 MEQ tablet Commonly known as:  K-DUR,KLOR-CON Take 2 tablets (40 mEq total) by mouth 2 (two) times daily.   torsemide 20 MG tablet Commonly known as:  DEMADEX Take 10 mg by mouth every other day. In the evening   zolpidem 10 MG tablet Commonly known as:  AMBIEN Take 10 mg by mouth at bedtime.        ROS:  A comprehensive review of systems was negative except for: Ears, nose, mouth, throat, and face: positive for sinus problems Respiratory: positive for cough Gastrointestinal: positive for nausea and indigestion/ right sided pain Genitourinary: positive for chronic UTIs/ kidney stones Musculoskeletal: positive for back pain, neck pain and stiff joints Neurological: positive for headaches Endocrine: positive for tired/ sluggish  Blood pressure (!) 151/71, pulse 89, temperature (!) 97.5 F (36.4 C), temperature source Temporal, resp. rate 18, weight 186 lb (84.4 kg). Physical Exam  Constitutional: She is oriented to person, place, and time. She appears well-developed and well-nourished.  HENT:  Head: Normocephalic and atraumatic.  Eyes: Pupils are equal, round, and reactive to light.  Cardiovascular: Normal rate and regular rhythm.  Pulmonary/Chest: Effort normal and breath sounds normal.  Abdominal: Normal appearance and bowel sounds are normal. There is tenderness in the right upper quadrant. No hernia.  Musculoskeletal: Normal range of motion.  Mild pitting edema bilateral lower legs  Neurological: She is alert and oriented to person, place, and time.  Skin: Skin is warm and dry.  Psychiatric: She has a normal mood and affect. Her behavior is normal.  Vitals reviewed.   Results: 07/2018 CT ab/pelvis- Personally reviewed- some dilation and layering sludge appreciated   Hepatobiliary: No mass visualized on this unenhanced exam. Tiny gallstones or layering  gallbladder sludge noted. No evidence of cholecystitis or biliary ductal dilatation.  Pancreas: No mass or inflammatory process visualized on this unenhanced exam. IMPRESSION: No evidence of urolithiasis, hydronephrosis, or other acute findings.  Tiny gallstones versus gallbladder sludge. No evidence of cholecystitis or biliary dilatation.  Colonic diverticulosis, without radiographic evidence of diverticulitis.  Pelvic floor laxity with cystocele.  Stable 3.2 cm infrarenal abdominal aortic aneurysm. Recommend followup by ultrasound in 3 years. This recommendation follows ACR consensus guidelines: White Paper of the ACR Incidental Findings Committee II on Vascular Findings. Joellyn Rued Radiol 2013; 10:789-794  LFTs 06/2018 Personally reviewed- T bili normal, no signs of abnormal LFTs  Results for Courtney Orozco, Courtney Orozco (MRN [id number]) as of 08/13/2018 15:18  Ref. Range 07/07/2018 18:04  Sodium Latest Ref Range: 135 - 145 mmol/L 139  Potassium Latest Ref Range: 3.5 - 5.1 mmol/L 3.7  Chloride Latest Ref Range: 98 - 111 mmol/L 110  CO2 Latest Ref Range: 22 - 32 mmol/L 20 (L)  Glucose Latest Ref Range: 70 - 99 mg/dL 114 (H)  BUN Latest Ref Range: 8 - 23 mg/dL 12  Creatinine Latest Ref Range: 0.44 - 1.00 mg/dL 0.97  Calcium Latest Ref Range: 8.9 - 10.3 mg/dL 9.5  Anion gap Latest Ref Range: 5 - 15  9  Alkaline Phosphatase Latest Ref Range: 38 - 126 U/L 79  Albumin Latest Ref Range: 3.5 - 5.0 g/dL 3.8  AST Latest Ref Range: 15 - 41 U/L 20  ALT Latest Ref Range: 0 - 44 U/L 21  Total Protein Latest Ref Range: 6.5 - 8.1 g/dL 6.9  Total Bilirubin Latest Ref Range: 0.3 - 1.2 mg/dL 0.6  GFR, Est Non African American Latest Ref Range: >60 mL/min 59 (L)  GFR, Est African American Latest Ref Range: >60 mL/min >60   Assessment & Plan:  Courtney Orozco is a 67 y.o. female with complaints of indigestion/ nausea and findings of CT a/p of gallstones / sludge. She says she has had some degree of right  sided pain for sometime but always thought this was related to her kidney stones. Her most recent CT for stones did not demonstrate any kidney stone  and found the gallstones/ sludge. She was referred by Dr. Alyson Ingles due to concern that some of the pain was related to her gallstones.  She currently has no symptoms of UTI but does get them chronically.   -Lap cholecystectomy possible open  -Hold Aspirin 5 days prior  -Will plan to check UA during preop appt to ensure no underlying infection   All questions were answered to the satisfaction of the patient and family.  PLAN: I counseled the patient about the indication, risks and benefits of laparoscopic cholecystectomy.  She understands there is a very small chance for bleeding, infection, injury to normal structures (including common bile duct), conversion to open surgery, persistent symptoms, evolution of postcholecystectomy diarrhea, need for secondary interventions, anesthesia reaction, cardiopulmonary issues and other risks not specifically detailed here. I described the expected recovery, the plan for follow-up and the restrictions during the recovery phase.  All questions were answered.   I spent over 25 minutes visiting face to face with the patient and discussing her health, gallstones, and counseling/ coordination of care regarding her cholecystectomy   Virl Cagey 08/13/2018, 10:53 AM

## 2018-08-13 NOTE — H&P (Signed)
[facility] History and Physical  Reason for Referral: Gallbladder Referring Physician: Dr. Alyson Ingles (Urologist)      Chief Complaint    Abdominal Pain      Courtney Orozco is a 67 y.o. female.  HPI: Ms. Speers is a 67 yo with chronic history of kidney stones and repeated UTI with E coli thought to be secondary to her having lodged kidney stones.  She has had recent hospitalizations back in the summer for the UTIs and required PICC line and IV antibiotics to clear the infection.  She says that she has suffered from this pain for years, and that she has had right and left sided kidney stones. She was not aware of possibly having some of the pain being related to her gallbladder until seeing Dr. Alyson Ingles.  She says that the right sided pain she always attributed to her kidney stones. She says that she has some nausea/ indigestion and that she does not vomit. She has lost some weight but reports that this is more related to diet and need to take in large quantities of water. She says that a lot of her symptoms occur when she goes to [facility].   She has cared for her parents for some time, and feels that she has neglected her own health. She walks with a walker due to some falls she says are related to right sided weakness. She says that she had MRIs in the past that questioned TIAs that she could have suffered from remotely.  She denies any recent lateral weakness, facial drooping, etc.  She has never had any heart attacks or chest pain.  She does report a remote history of Hepatitis B and ? Colitis that was treated in [place], [place].      Past Medical History:  Diagnosis Date  . Anxiety   . Arthritis   . Back pain   . Depression   . Diabetes mellitus without complication ([place])    no meds in 5 years   . Gait instability   . GERD (gastroesophageal reflux disease)   . Headache    hx of migraines   . Hepatitis    hx of hep B - 44 years ago   .  History of kidney stones   . Hypertension   . Hypothyroidism   . Memory loss   . Pneumonia    hx of 01/2015   . PONV (postoperative nausea and vomiting)    pt states "the last two times ive been put to sleep, my oxygen drops low and they have trouble waking me up.  . Thyroid disease   . TIA (transient ischemic attack)          Past Surgical History:  Procedure Laterality Date  . ABDOMINAL HYSTERECTOMY    . APPENDECTOMY    . BACK SURGERY     x 4  . bladder tack surgery     . BREAST SURGERY Right    removal of 3 benign tumors  . CATARACT EXTRACTION W/PHACO Left 04/30/2017   Procedure: CATARACT EXTRACTION PHACO AND INTRAOCULAR LENS PLACEMENT (IOC);  Surgeon: Tonny Branch, MD;  Location: [facility];  Service: Ophthalmology;  Laterality: Left;  CDE: 7.16  . CATARACT EXTRACTION W/PHACO Right 05/14/2017   Procedure: CATARACT EXTRACTION PHACO AND INTRAOCULAR LENS PLACEMENT RIGHT EYE;  Surgeon: Tonny Branch, MD;  Location: [facility];  Service: Ophthalmology;  Laterality: Right;  CDE: 6.63  . CYSTOSCOPY WITH STENT PLACEMENT Right 09/28/2017   Procedure: CYSTOSCOPY WITH  STENT PLACEMENT;  Surgeon: Franchot Gallo, MD;  Location: [facility];  Service: Urology;  Laterality: Right;  . CYSTOSCOPY WITH URETEROSCOPY, STONE BASKETRY AND STENT PLACEMENT Right 04/06/2015   Procedure: CYSTOSCOPY WITH RIGHT URETEROSCOPY, STONE BASKETRY AND STENT PLACEMENT;  Surgeon: Irine Seal, MD;  [facility]: [facility];  Service: Urology;  Laterality: Right;  . CYSTOSCOPY/RETROGRADE/URETEROSCOPY Right 11/28/2017   Procedure: CYSTOSCOPY,  REMOVAL OF RIGHT URETERAL STENT, RIGHT RETROGRADE PYELOGRAM, RIGHT URETEROSCOPY, STONE BASKET EXTRACTION RIGHT URETERAL CALCULUS;  Surgeon: Cleon Gustin, MD;  Location: [facility];  Service: Urology;  Laterality: Right;  . HOLMIUM LASER APPLICATION Right 0/07/9322   Procedure: HOLMIUM LASER APPLICATION;  Surgeon: Irine Seal, MD;  Location: [facility];  Service: Urology;  Laterality:  Right;  . KNEE SURGERY     left  . ROTATOR CUFF REPAIR Left   . thumb surgery     left         Family History  Problem Relation Age of Onset  . COPD Father     Social History        Tobacco Use  . Smoking status: Current Every Day Smoker    Packs/day: 0.50    Years: 50.00    Pack years: 25.00    Types: Cigarettes  . Smokeless tobacco: Never Used  Substance Use Topics  . Alcohol use: No  . Drug use: No    Medications: I have reviewed the patient's current medications.      Allergies as of 08/13/2018      Reactions   Ciprofloxacin Hives   Can take levaquin with no problems per patient and family   Macrobid [nitrofurantoin] Nausea And Vomiting   Azithromycin Itching, Swelling   Sulfa Antibiotics Itching               Medication List            Accurate as of 08/13/18 10:53 AM. Always use your most recent med list.           ADVAIR DISKUS 250-50 MCG/DOSE Aepb Generic drug:  Fluticasone-Salmeterol Inhale 1 puff into the lungs daily as needed (shortness of breath).   albuterol 108 (90 Base) MCG/ACT inhaler Commonly known as:  PROVENTIL HFA;VENTOLIN HFA Inhale 2 puffs into the lungs every 6 (six) hours as needed for wheezing or shortness of breath.   aspirin 325 MG tablet Take 325 mg by mouth daily.   buPROPion 300 MG 24 hr tablet Commonly known as:  WELLBUTRIN XL Take 300 mg by mouth daily.   diazepam 5 MG tablet Commonly known as:  VALIUM Take 5 mg by mouth every 8 (eight) hours as needed for anxiety or muscle spasms.   Flax Oil-Fish Oil-Borage Oil Caps Take 1 capsule by mouth daily.   HYDROcodone-acetaminophen 5-325 MG tablet Commonly known as:  NORCO/VICODIN Take 1 tablet by mouth every 4 (four) hours as needed for moderate pain.   levothyroxine 100 MCG tablet Commonly known as:  SYNTHROID, LEVOTHROID Take 100 mcg by mouth daily. Take with 178mcg tablet for total dose of 266mcg daily     levothyroxine 175 MCG tablet Commonly known as:  SYNTHROID, LEVOTHROID Take 175 mcg by mouth daily. Take one tablet with 145mcg tablet for total dose of 267mcg   multivitamin with minerals Tabs tablet Take 1 tablet by mouth daily.   potassium chloride SA 20 MEQ tablet Commonly known as:  K-DUR,KLOR-CON Take 2 tablets (40 mEq total) by mouth 2 (two) times daily.   torsemide 20 MG tablet Commonly  known as:  DEMADEX Take 10 mg by mouth every other day. In the evening   zolpidem 10 MG tablet Commonly known as:  AMBIEN Take 10 mg by mouth at bedtime.        ROS:  A comprehensive review of systems was negative except for: Ears, nose, mouth, throat, and face: positive for sinus problems Respiratory: positive for cough Gastrointestinal: positive for nausea and indigestion/ right sided pain Genitourinary: positive for chronic UTIs/ kidney stones Musculoskeletal: positive for back pain, neck pain and stiff joints Neurological: positive for headaches Endocrine: positive for tired/ sluggish  Blood pressure (!) 151/71, pulse 89, temperature (!) 97.5 F (36.4 C), temperature source Temporal, resp. rate 18, weight 186 lb (84.4 kg). Physical Exam  Constitutional: She is oriented to person, place, and time. She appears well-developed and well-nourished.  HENT:  Head: Normocephalic and atraumatic.  Eyes: Pupils are equal, round, and reactive to light.  Cardiovascular: Normal rate and regular rhythm.  Pulmonary/Chest: Effort normal and breath sounds normal.  Abdominal: Normal appearance and bowel sounds are normal. There is tenderness in the right upper quadrant. No hernia.  Musculoskeletal: Normal range of motion.  Mild pitting edema bilateral lower legs  Neurological: She is alert and oriented to person, place, and time.  Skin: Skin is warm and dry.  Psychiatric: She has a normal mood and affect. Her behavior is normal.  Vitals reviewed.   Results: 07/2018 CT  ab/pelvis- Personally reviewed- some dilation and layering sludge appreciated   Hepatobiliary: No mass visualized on this unenhanced exam. Tiny gallstones or layering gallbladder sludge noted. No evidence of cholecystitis or biliary ductal dilatation.  Pancreas: No mass or inflammatory process visualized on this unenhanced exam. IMPRESSION: No evidence of urolithiasis, hydronephrosis, or other acute findings.  Tiny gallstones versus gallbladder sludge. No evidence of cholecystitis or biliary dilatation.  Colonic diverticulosis, without radiographic evidence of diverticulitis.  Pelvic floor laxity with cystocele.  Stable 3.2 cm infrarenal abdominal aortic aneurysm. Recommend followup by ultrasound in 3 years. This recommendation follows ACR consensus guidelines: White Paper of the ACR Incidental Findings Committee II on Vascular Findings. Joellyn Rued Radiol 2013; 10:789-794  LFTs 06/2018 Personally reviewed- T bili normal, no signs of abnormal LFTs  Results for AROHI, SALVATIERRA (MRN [id number]) as of 08/13/2018 15:18  Ref. Range 07/07/2018 18:04  Sodium Latest Ref Range: 135 - 145 mmol/L 139  Potassium Latest Ref Range: 3.5 - 5.1 mmol/L 3.7  Chloride Latest Ref Range: 98 - 111 mmol/L 110  CO2 Latest Ref Range: 22 - 32 mmol/L 20 (L)  Glucose Latest Ref Range: 70 - 99 mg/dL 114 (H)  BUN Latest Ref Range: 8 - 23 mg/dL 12  Creatinine Latest Ref Range: 0.44 - 1.00 mg/dL 0.97  Calcium Latest Ref Range: 8.9 - 10.3 mg/dL 9.5  Anion gap Latest Ref Range: 5 - 15  9  Alkaline Phosphatase Latest Ref Range: 38 - 126 U/L 79  Albumin Latest Ref Range: 3.5 - 5.0 g/dL 3.8  AST Latest Ref Range: 15 - 41 U/L 20  ALT Latest Ref Range: 0 - 44 U/L 21  Total Protein Latest Ref Range: 6.5 - 8.1 g/dL 6.9  Total Bilirubin Latest Ref Range: 0.3 - 1.2 mg/dL 0.6  GFR, Est Non African American Latest Ref Range: >60 mL/min 59 (L)  GFR, Est African American Latest Ref Range: >60 mL/min >60    Assessment & Plan:  PRIYA MATSEN is a 67 y.o. female with complaints of indigestion/ nausea and findings  of CT a/p of gallstones / sludge. She says she has had some degree of right sided pain for sometime but always thought this was related to her kidney stones. Her most recent CT for stones did not demonstrate any kidney stone and found the gallstones/ sludge. She was referred by Dr. Alyson Ingles due to concern that some of the pain was related to her gallstones.  She currently has no symptoms of UTI but does get them chronically.   -Lap cholecystectomy possible open  -Hold Aspirin 5 days prior  -Will plan to check UA during preop appt to ensure no underlying infection   All questions were answered to the satisfaction of the patient and family.  PLAN: I counseled the patient about the indication, risks and benefits of laparoscopic cholecystectomy.  She understands there is a very small chance for bleeding, infection, injury to normal structures (including common bile duct), conversion to open surgery, persistent symptoms, evolution of postcholecystectomy diarrhea, need for secondary interventions, anesthesia reaction, cardiopulmonary issues and other risks not specifically detailed here. I described the expected recovery, the plan for follow-up and the restrictions during the recovery phase.  All questions were answered.   I spent over 25 minutes visiting face to face with the patient and discussing her health, gallstones, and counseling/ coordination of care regarding her cholecystectomy   Virl Cagey 08/13/2018, 10:53 AM

## 2018-08-14 NOTE — Patient Instructions (Signed)
Courtney Orozco  08/14/2018     @PREFPERIOPPHARMACY @   Your procedure is scheduled on  08/21/2018.  Report to [name] at  615  A.M.  Call this number if you have problems the morning of surgery:  [phone]   Remember:  Do not eat or drink after midnight.                         Take these medicines the morning of surgery with A SIP OF WATER  Bupropion, hydrocodone, levothyroxine. Use your inhalers before you come.    Do not wear jewelry, make-up or nail polish.  Do not wear lotions, powders, or perfumes, or deodorant.  Do not shave 48 hours prior to surgery.  Men may shave face and neck.  Do not bring valuables to the hospital.  [facility] is not responsible for any belongings or valuables.  Contacts, dentures or bridgework may not be worn into surgery.  Leave your suitcase in the car.  After surgery it may be brought to your room.  For patients admitted to the hospital, discharge time will be determined by your treatment team.  Patients discharged the day of surgery will not be allowed to drive home.   Name and phone number of your driver:   family Special instructions:  None  Please read over the following fact sheets that you were given. Anesthesia Post-op Instructions and Care and Recovery After Surgery       Laparoscopic Cholecystectomy Laparoscopic cholecystectomy is surgery to remove the gallbladder. The gallbladder is a pear-shaped organ that lies beneath the liver on the right side of the body. The gallbladder stores bile, which is a fluid that helps the body to digest fats. Cholecystectomy is often done for inflammation of the gallbladder (cholecystitis). This condition is usually caused by a buildup of gallstones (cholelithiasis) in the gallbladder. Gallstones can block the flow of bile, which can result in inflammation and pain. In severe cases, emergency surgery may be required. This procedure is done though small incisions in your abdomen  (laparoscopic surgery). A thin scope with a camera (laparoscope) is inserted through one incision. Thin surgical instruments are inserted through the other incisions. In some cases, a laparoscopic procedure may be turned into a type of surgery that is done through a larger incision (open surgery). Tell a health care provider about:  Any allergies you have.  All medicines you are taking, including vitamins, herbs, eye drops, creams, and over-the-counter medicines.  Any problems you or family members have had with anesthetic medicines.  Any blood disorders you have.  Any surgeries you have had.  Any medical conditions you have.  Whether you are pregnant or may be pregnant. What are the risks? Generally, this is a safe procedure. However, problems may occur, including:  Infection.  Bleeding.  Allergic reactions to medicines.  Damage to other structures or organs.  A stone remaining in the common bile duct. The common bile duct carries bile from the gallbladder into the small intestine.  A bile leak from the cyst duct that is clipped when your gallbladder is removed.  What happens before the procedure? Staying hydrated Follow instructions from your health care provider about hydration, which may include:  Up to 2 hours before the procedure - you may continue to drink clear liquids, such as water, clear fruit juice, black coffee, and plain tea.  Eating and drinking restrictions Follow  instructions from your health care provider about eating and drinking, which may include:  8 hours before the procedure - stop eating heavy meals or foods such as meat, fried foods, or fatty foods.  6 hours before the procedure - stop eating light meals or foods, such as toast or cereal.  6 hours before the procedure - stop drinking milk or drinks that contain milk.  2 hours before the procedure - stop drinking clear liquids.  Medicines  Ask your health care provider about: ? Changing or  stopping your regular medicines. This is especially important if you are taking diabetes medicines or blood thinners. ? Taking medicines such as aspirin and ibuprofen. These medicines can thin your blood. Do not take these medicines before your procedure if your health care provider instructs you not to.  You may be given antibiotic medicine to help prevent infection. General instructions  Let your health care provider know if you develop a cold or an infection before surgery.  Plan to have someone take you home from the hospital or clinic.  Ask your health care provider how your surgical site will be marked or identified. What happens during the procedure?  To reduce your risk of infection: ? Your health care team will wash or sanitize their hands. ? Your skin will be washed with soap. ? Hair may be removed from the surgical area.  An IV tube may be inserted into one of your veins.  You will be given one or more of the following: ? A medicine to help you relax (sedative). ? A medicine to make you fall asleep (general anesthetic).  A breathing tube will be placed in your mouth.  Your surgeon will make several small cuts (incisions) in your abdomen.  The laparoscope will be inserted through one of the small incisions. The camera on the laparoscope will send images to a TV screen (monitor) in the operating room. This lets your surgeon see inside your abdomen.  Air-like gas will be pumped into your abdomen. This will expand your abdomen to give the surgeon more room to perform the surgery.  Other tools that are needed for the procedure will be inserted through the other incisions. The gallbladder will be removed through one of the incisions.  Your common bile duct may be examined. If stones are found in the common bile duct, they may be removed.  After your gallbladder has been removed, the incisions will be closed with stitches (sutures), staples, or skin glue.  Your incisions may  be covered with a bandage (dressing). The procedure may vary among health care providers and hospitals. What happens after the procedure?  Your blood pressure, heart rate, breathing rate, and blood oxygen level will be monitored until the medicines you were given have worn off.  You will be given medicines as needed to control your pain.  Do not drive for 24 hours if you were given a sedative. This information is not intended to replace advice given to you by your health care provider. Make sure you discuss any questions you have with your health care provider. Document Released: 10/09/2005 Document Revised: 04/30/2016 Document Reviewed: 03/27/2016 Elsevier Interactive Patient Education  2018 [employer].  Laparoscopic Cholecystectomy, Care After This sheet gives you information about how to care for yourself after your procedure. Your health care provider may also give you more specific instructions. If you have problems or questions, contact your health care provider. What can I expect after the procedure? After the procedure, it  is common to have:  Pain at your incision sites. You will be given medicines to control this pain.  Mild nausea or vomiting.  Bloating and possible shoulder pain from the air-like gas that was used during the procedure.  Follow these instructions at home: Incision care   Follow instructions from your health care provider about how to take care of your incisions. Make sure you: ? Wash your hands with soap and water before you change your bandage (dressing). If soap and water are not available, use hand sanitizer. ? Change your dressing as told by your health care provider. ? Leave stitches (sutures), skin glue, or adhesive strips in place. These skin closures may need to be in place for 2 weeks or longer. If adhesive strip edges start to loosen and curl up, you may trim the loose edges. Do not remove adhesive strips completely unless your health care  provider tells you to do that.  Do not take baths, swim, or use a hot tub until your health care provider approves. Ask your health care provider if you can take showers. You may only be allowed to take sponge baths for bathing.  Check your incision area every day for signs of infection. Check for: ? More redness, swelling, or pain. ? More fluid or blood. ? Warmth. ? Pus or a bad smell. Activity  Do not drive or use heavy machinery while taking prescription pain medicine.  Do not lift anything that is heavier than 10 lb (4.5 kg) until your health care provider approves.  Do not play contact sports until your health care provider approves.  Do not drive for 24 hours if you were given a medicine to help you relax (sedative).  Rest as needed. Do not return to work or school until your health care provider approves. General instructions  Take over-the-counter and prescription medicines only as told by your health care provider.  To prevent or treat constipation while you are taking prescription pain medicine, your health care provider may recommend that you: ? Drink enough fluid to keep your urine clear or pale yellow. ? Take over-the-counter or prescription medicines. ? Eat foods that are high in fiber, such as fresh fruits and vegetables, whole grains, and beans. ? Limit foods that are high in fat and processed sugars, such as fried and sweet foods. Contact a health care provider if:  You develop a rash.  You have more redness, swelling, or pain around your incisions.  You have more fluid or blood coming from your incisions.  Your incisions feel warm to the touch.  You have pus or a bad smell coming from your incisions.  You have a fever.  One or more of your incisions breaks open. Get help right away if:  You have trouble breathing.  You have chest pain.  You have increasing pain in your shoulders.  You faint or feel dizzy when you stand.  You have severe pain in  your abdomen.  You have nausea or vomiting that lasts for more than one day.  You have leg pain. This information is not intended to replace advice given to you by your health care provider. Make sure you discuss any questions you have with your health care provider. Document Released: 10/09/2005 Document Revised: 04/29/2016 Document Reviewed: 03/27/2016 Elsevier Interactive Patient Education  2018 [place] Anesthesia, Adult General anesthesia is the use of medicines to make a person "go to sleep" (be unconscious) for a medical procedure. General anesthesia is  often recommended when a procedure:  Is long.  Requires you to be still or in an unusual position.  Is major and can cause you to lose blood.  Is impossible to do without general anesthesia.  The medicines used for general anesthesia are called general anesthetics. In addition to making you sleep, the medicines:  Prevent pain.  Control your blood pressure.  Relax your muscles.  Tell a health care provider about:  Any allergies you have.  All medicines you are taking, including vitamins, herbs, eye drops, creams, and over-the-counter medicines.  Any problems you or family members have had with anesthetic medicines.  Types of anesthetics you have had in the past.  Any bleeding disorders you have.  Any surgeries you have had.  Any medical conditions you have.  Any history of heart or lung conditions, such as heart failure, sleep apnea, or chronic obstructive pulmonary disease (COPD).  Whether you are pregnant or may be pregnant.  Whether you use tobacco, alcohol, marijuana, or street drugs.  Any history of Armed forces logistics/support/administrative officer.  Any history of depression or anxiety. What are the risks? Generally, this is a safe procedure. However, problems may occur, including:  Allergic reaction to anesthetics.  Lung and heart problems.  Inhaling food or liquids from your stomach into your lungs  (aspiration).  Injury to nerves.  Waking up during your procedure and being unable to move (rare).  Extreme agitation or a state of mental confusion (delirium) when you wake up from the anesthetic.  Air in the bloodstream, which can lead to stroke.  These problems are more likely to develop if you are having a major surgery or if you have an advanced medical condition. You can prevent some of these complications by answering all of your health care provider's questions thoroughly and by following all pre-procedure instructions. General anesthesia can cause side effects, including:  Nausea or vomiting  A sore throat from the breathing tube.  Feeling cold or shivery.  Feeling tired, washed out, or achy.  Sleepiness or drowsiness.  Confusion or agitation.  What happens before the procedure? Staying hydrated Follow instructions from your health care provider about hydration, which may include:  Up to 2 hours before the procedure - you may continue to drink clear liquids, such as water, clear fruit juice, black coffee, and plain tea.  Eating and drinking restrictions Follow instructions from your health care provider about eating and drinking, which may include:  8 hours before the procedure - stop eating heavy meals or foods such as meat, fried foods, or fatty foods.  6 hours before the procedure - stop eating light meals or foods, such as toast or cereal.  6 hours before the procedure - stop drinking milk or drinks that contain milk.  2 hours before the procedure - stop drinking clear liquids.  Medicines  Ask your health care provider about: ? Changing or stopping your regular medicines. This is especially important if you are taking diabetes medicines or blood thinners. ? Taking medicines such as aspirin and ibuprofen. These medicines can thin your blood. Do not take these medicines before your procedure if your health care provider instructs you not to. ? Taking new  dietary supplements or medicines. Do not take these during the week before your procedure unless your health care provider approves them.  If you are told to take a medicine or to continue taking a medicine on the day of the procedure, take the medicine with sips of water. General instructions  Ask if you will be going home the same day, the following day, or after a longer hospital stay. ? Plan to have someone take you home. ? Plan to have someone stay with you for the first 24 hours after you leave the hospital or clinic.  For 3-6 weeks before the procedure, try not to use any tobacco products, such as cigarettes, chewing tobacco, and e-cigarettes.  You may brush your teeth on the morning of the procedure, but make sure to spit out the toothpaste. What happens during the procedure?  You will be given anesthetics through a mask and through an IV tube in one of your veins.  You may receive medicine to help you relax (sedative).  As soon as you are asleep, a breathing tube may be used to help you breathe.  An anesthesia specialist will stay with you throughout the procedure. He or she will help keep you comfortable and safe by continuing to give you medicines and adjusting the amount of medicine that you get. He or she will also watch your blood pressure, pulse, and oxygen levels to make sure that the anesthetics do not cause any problems.  If a breathing tube was used to help you breathe, it will be removed before you wake up. The procedure may vary among health care providers and hospitals. What happens after the procedure?  You will wake up, often slowly, after the procedure is complete, usually in a recovery area.  Your blood pressure, heart rate, breathing rate, and blood oxygen level will be monitored until the medicines you were given have worn off.  You may be given medicine to help you calm down if you feel anxious or agitated.  If you will be going home the same day, your  health care provider may check to make sure you can stand, drink, and urinate.  Your health care providers will treat your pain and side effects before you go home.  Do not drive for 24 hours if you received a sedative.  You may: ? Feel nauseous and vomit. ? Have a sore throat. ? Have mental slowness. ? Feel cold or shivery. ? Feel sleepy. ? Feel tired. ? Feel sore or achy, even in parts of your body where you did not have surgery. This information is not intended to replace advice given to you by your health care provider. Make sure you discuss any questions you have with your health care provider. Document Released: 01/16/2008 Document Revised: 03/21/2016 Document Reviewed: 09/23/2015 Elsevier Interactive Patient Education  2018 [place] Anesthesia, Adult, Care After These instructions provide you with information about caring for yourself after your procedure. Your health care provider may also give you more specific instructions. Your treatment has been planned according to current medical practices, but problems sometimes occur. Call your health care provider if you have any problems or questions after your procedure. What can I expect after the procedure? After the procedure, it is common to have:  Vomiting.  A sore throat.  Mental slowness.  It is common to feel:  Nauseous.  Cold or shivery.  Sleepy.  Tired.  Sore or achy, even in parts of your body where you did not have surgery.  Follow these instructions at home: For at least 24 hours after the procedure:  Do not: ? Participate in activities where you could fall or become injured. ? Drive. ? Use heavy machinery. ? Drink alcohol. ? Take sleeping pills or medicines that cause drowsiness. ? Make important decisions  or sign legal documents. ? Take care of children on your own.  Rest. Eating and drinking  If you vomit, drink water, juice, or soup when you can drink without vomiting.  Drink  enough fluid to keep your urine clear or pale yellow.  Make sure you have little or no nausea before eating solid foods.  Follow the diet recommended by your health care provider. General instructions  Have a responsible adult stay with you until you are awake and alert.  Return to your normal activities as told by your health care provider. Ask your health care provider what activities are safe for you.  Take over-the-counter and prescription medicines only as told by your health care provider.  If you smoke, do not smoke without supervision.  Keep all follow-up visits as told by your health care provider. This is important. Contact a health care provider if:  You continue to have nausea or vomiting at home, and medicines are not helpful.  You cannot drink fluids or start eating again.  You cannot urinate after 8-12 hours.  You develop a skin rash.  You have fever.  You have increasing redness at the site of your procedure. Get help right away if:  You have difficulty breathing.  You have chest pain.  You have unexpected bleeding.  You feel that you are having a life-threatening or urgent problem. This information is not intended to replace advice given to you by your health care provider. Make sure you discuss any questions you have with your health care provider. Document Released: 01/15/2001 Document Revised: 03/13/2016 Document Reviewed: 09/23/2015 Elsevier Interactive Patient Education  [employer].

## 2018-08-16 ENCOUNTER — Encounter (HOSPITAL_COMMUNITY): Payer: Self-pay

## 2018-08-16 ENCOUNTER — Encounter (HOSPITAL_COMMUNITY)
Admission: RE | Admit: 2018-08-16 | Discharge: 2018-08-16 | Disposition: A | Discharge status: Home / Self Care | Payer: PPO | Source: Ambulatory Visit | Attending: General Surgery | Admitting: General Surgery

## 2018-08-16 DIAGNOSIS — E119 Type 2 diabetes mellitus without complications: Secondary | ICD-10-CM | POA: Diagnosis not present

## 2018-08-16 DIAGNOSIS — Z01818 Encounter for other preprocedural examination: Secondary | ICD-10-CM | POA: Insufficient documentation

## 2018-08-16 LAB — URINALYSIS, COMPLETE (UACMP) WITH MICROSCOPIC
BILIRUBIN URINE: NEGATIVE
Glucose, UA: NEGATIVE mg/dL
HGB URINE DIPSTICK: NEGATIVE
Ketones, ur: NEGATIVE mg/dL
Nitrite: POSITIVE — AB
PH: 6 (ref 5.0–8.0)
Protein, ur: NEGATIVE mg/dL
SPECIFIC GRAVITY, URINE: 1.008 (ref 1.005–1.030)

## 2018-08-16 LAB — HEMOGLOBIN A1C
Hgb A1c MFr Bld: 5.9 % — ABNORMAL HIGH (ref 4.8–5.6)
Mean Plasma Glucose: 122.63 mg/dL

## 2018-08-16 LAB — GLUCOSE, CAPILLARY: Glucose-Capillary: 90 mg/dL (ref 70–99)

## 2018-08-18 LAB — URINE CULTURE: Special Requests: NORMAL

## 2018-08-19 DIAGNOSIS — Z23 Encounter for immunization: Secondary | ICD-10-CM | POA: Diagnosis not present

## 2018-08-19 DIAGNOSIS — K8020 Calculus of gallbladder without cholecystitis without obstruction: Secondary | ICD-10-CM | POA: Diagnosis not present

## 2018-08-19 DIAGNOSIS — Z79899 Other long term (current) drug therapy: Secondary | ICD-10-CM | POA: Diagnosis not present

## 2018-08-19 DIAGNOSIS — I10 Essential (primary) hypertension: Secondary | ICD-10-CM | POA: Diagnosis not present

## 2018-08-19 DIAGNOSIS — G4700 Insomnia, unspecified: Secondary | ICD-10-CM | POA: Diagnosis not present

## 2018-08-19 NOTE — Pre-Procedure Instructions (Signed)
Urine culture and HgbA1C routed to Dr Constance Haw and PCP.

## 2018-08-20 ENCOUNTER — Telehealth: Payer: Self-pay | Admitting: General Surgery

## 2018-08-20 NOTE — Telephone Encounter (Signed)
Called patient regarding Urinalysis. E coli in the urine but no symptoms, no frequency, no pain, no hesitancy. She does report some tiredness but nothing unusual. Will plan for Lap chole as this is likely colonization, and will treat with Invanz to cover the ESBL E coli either way.   Curlene Labrum, MD [facility] [address] [place],  [address] [phone] (office)

## 2018-08-21 ENCOUNTER — Ambulatory Visit (HOSPITAL_COMMUNITY)
Admission: RE | Admit: 2018-08-21 | Discharge: 2018-08-21 | Disposition: A | Discharge status: Home / Self Care | Payer: PPO | Source: Ambulatory Visit | Attending: General Surgery | Admitting: General Surgery

## 2018-08-21 ENCOUNTER — Ambulatory Visit (HOSPITAL_COMMUNITY): Payer: PPO | Admitting: Anesthesiology

## 2018-08-21 ENCOUNTER — Encounter (HOSPITAL_COMMUNITY): Payer: Self-pay | Admitting: Registered Nurse

## 2018-08-21 ENCOUNTER — Encounter (HOSPITAL_COMMUNITY)
Admission: RE | Disposition: A | Discharge status: Home / Self Care | Payer: Self-pay | Source: Ambulatory Visit | Attending: General Surgery

## 2018-08-21 ENCOUNTER — Telehealth: Payer: Self-pay | Admitting: General Surgery

## 2018-08-21 DIAGNOSIS — F17200 Nicotine dependence, unspecified, uncomplicated: Secondary | ICD-10-CM | POA: Diagnosis not present

## 2018-08-21 DIAGNOSIS — N8110 Cystocele, unspecified: Secondary | ICD-10-CM | POA: Insufficient documentation

## 2018-08-21 DIAGNOSIS — M549 Dorsalgia, unspecified: Secondary | ICD-10-CM | POA: Diagnosis not present

## 2018-08-21 DIAGNOSIS — K828 Other specified diseases of gallbladder: Secondary | ICD-10-CM | POA: Diagnosis not present

## 2018-08-21 DIAGNOSIS — R413 Other amnesia: Secondary | ICD-10-CM | POA: Diagnosis not present

## 2018-08-21 DIAGNOSIS — Z7982 Long term (current) use of aspirin: Secondary | ICD-10-CM | POA: Insufficient documentation

## 2018-08-21 DIAGNOSIS — Z87440 Personal history of urinary (tract) infections: Secondary | ICD-10-CM | POA: Diagnosis not present

## 2018-08-21 DIAGNOSIS — Z8619 Personal history of other infectious and parasitic diseases: Secondary | ICD-10-CM | POA: Diagnosis not present

## 2018-08-21 DIAGNOSIS — R2681 Unsteadiness on feet: Secondary | ICD-10-CM | POA: Insufficient documentation

## 2018-08-21 DIAGNOSIS — Z825 Family history of asthma and other chronic lower respiratory diseases: Secondary | ICD-10-CM | POA: Insufficient documentation

## 2018-08-21 DIAGNOSIS — E039 Hypothyroidism, unspecified: Secondary | ICD-10-CM | POA: Insufficient documentation

## 2018-08-21 DIAGNOSIS — Z9841 Cataract extraction status, right eye: Secondary | ICD-10-CM | POA: Insufficient documentation

## 2018-08-21 DIAGNOSIS — F329 Major depressive disorder, single episode, unspecified: Secondary | ICD-10-CM | POA: Diagnosis not present

## 2018-08-21 DIAGNOSIS — Z87442 Personal history of urinary calculi: Secondary | ICD-10-CM | POA: Insufficient documentation

## 2018-08-21 DIAGNOSIS — G43909 Migraine, unspecified, not intractable, without status migrainosus: Secondary | ICD-10-CM | POA: Insufficient documentation

## 2018-08-21 DIAGNOSIS — K8020 Calculus of gallbladder without cholecystitis without obstruction: Secondary | ICD-10-CM

## 2018-08-21 DIAGNOSIS — Z9842 Cataract extraction status, left eye: Secondary | ICD-10-CM | POA: Diagnosis not present

## 2018-08-21 DIAGNOSIS — Z881 Allergy status to other antibiotic agents status: Secondary | ICD-10-CM | POA: Insufficient documentation

## 2018-08-21 DIAGNOSIS — K5730 Diverticulosis of large intestine without perforation or abscess without bleeding: Secondary | ICD-10-CM | POA: Diagnosis not present

## 2018-08-21 DIAGNOSIS — Z8673 Personal history of transient ischemic attack (TIA), and cerebral infarction without residual deficits: Secondary | ICD-10-CM | POA: Insufficient documentation

## 2018-08-21 DIAGNOSIS — F17210 Nicotine dependence, cigarettes, uncomplicated: Secondary | ICD-10-CM | POA: Insufficient documentation

## 2018-08-21 DIAGNOSIS — K219 Gastro-esophageal reflux disease without esophagitis: Secondary | ICD-10-CM | POA: Diagnosis not present

## 2018-08-21 DIAGNOSIS — I10 Essential (primary) hypertension: Secondary | ICD-10-CM | POA: Diagnosis not present

## 2018-08-21 DIAGNOSIS — K8010 Calculus of gallbladder with chronic cholecystitis without obstruction: Secondary | ICD-10-CM | POA: Insufficient documentation

## 2018-08-21 DIAGNOSIS — Z90710 Acquired absence of both cervix and uterus: Secondary | ICD-10-CM | POA: Insufficient documentation

## 2018-08-21 DIAGNOSIS — M1990 Unspecified osteoarthritis, unspecified site: Secondary | ICD-10-CM | POA: Insufficient documentation

## 2018-08-21 DIAGNOSIS — I714 Abdominal aortic aneurysm, without rupture: Secondary | ICD-10-CM | POA: Diagnosis not present

## 2018-08-21 DIAGNOSIS — F419 Anxiety disorder, unspecified: Secondary | ICD-10-CM | POA: Insufficient documentation

## 2018-08-21 DIAGNOSIS — E119 Type 2 diabetes mellitus without complications: Secondary | ICD-10-CM | POA: Insufficient documentation

## 2018-08-21 DIAGNOSIS — Z882 Allergy status to sulfonamides status: Secondary | ICD-10-CM | POA: Insufficient documentation

## 2018-08-21 DIAGNOSIS — Z79899 Other long term (current) drug therapy: Secondary | ICD-10-CM | POA: Insufficient documentation

## 2018-08-21 HISTORY — PX: CHOLECYSTECTOMY: SHX55

## 2018-08-21 LAB — GLUCOSE, CAPILLARY
GLUCOSE-CAPILLARY: 126 mg/dL — AB (ref 70–99)
Glucose-Capillary: 149 mg/dL — ABNORMAL HIGH (ref 70–99)

## 2018-08-21 SURGERY — LAPAROSCOPIC CHOLECYSTECTOMY
Anesthesia: General | Site: Abdomen

## 2018-08-21 MED ORDER — ONDANSETRON HCL 4 MG/2ML IJ SOLN
INTRAMUSCULAR | Status: DC | PRN
  Administered 2018-08-21: 4 mg via INTRAVENOUS

## 2018-08-21 MED ORDER — MIDAZOLAM HCL 2 MG/2ML IJ SOLN
INTRAMUSCULAR | Status: AC
  Filled 2018-08-21: qty 2

## 2018-08-21 MED ORDER — FENTANYL CITRATE (PF) 250 MCG/5ML IJ SOLN
INTRAMUSCULAR | Status: AC
  Filled 2018-08-21: qty 5

## 2018-08-21 MED ORDER — CHLORHEXIDINE GLUCONATE CLOTH 2 % EX PADS: 6.0000 | MEDICATED_PAD | Freq: Once | CUTANEOUS | Status: DC

## 2018-08-21 MED ORDER — HEMOSTATIC AGENTS (NO CHARGE) OPTIME
TOPICAL | Status: DC | PRN
  Administered 2018-08-21: 1 via TOPICAL

## 2018-08-21 MED ORDER — DEXAMETHASONE SODIUM PHOSPHATE 4 MG/ML IJ SOLN
INTRAMUSCULAR | Status: AC
  Filled 2018-08-21: qty 1

## 2018-08-21 MED ORDER — SUGAMMADEX SODIUM 200 MG/2ML IV SOLN
INTRAVENOUS | Status: DC | PRN
  Administered 2018-08-21: 200 mg via INTRAVENOUS

## 2018-08-21 MED ORDER — BUPIVACAINE HCL (PF) 0.5 % IJ SOLN
INTRAMUSCULAR | Status: AC
  Filled 2018-08-21: qty 30

## 2018-08-21 MED ORDER — LIDOCAINE HCL (PF) 1 % IJ SOLN
INTRAMUSCULAR | Status: AC
  Filled 2018-08-21: qty 15

## 2018-08-21 MED ORDER — HYDROCODONE-ACETAMINOPHEN 7.5-325 MG PO TABS
1.0000 | ORAL_TABLET | Freq: Once | ORAL | Status: AC | PRN
  Administered 2018-08-21: 1 via ORAL
  Filled 2018-08-21: qty 1

## 2018-08-21 MED ORDER — PROMETHAZINE HCL 25 MG/ML IJ SOLN: 6.2500 mg | INTRAMUSCULAR | Status: DC | PRN

## 2018-08-21 MED ORDER — BUPIVACAINE HCL (PF) 0.5 % IJ SOLN
INTRAMUSCULAR | Status: DC | PRN
  Administered 2018-08-21: 10 mL

## 2018-08-21 MED ORDER — SODIUM CHLORIDE 0.9 % IR SOLN
Status: DC | PRN
  Administered 2018-08-21: 1

## 2018-08-21 MED ORDER — LACTATED RINGERS IV SOLN
INTRAVENOUS | Status: DC
  Administered 2018-08-21: 07:00:00 via INTRAVENOUS

## 2018-08-21 MED ORDER — PROPOFOL 10 MG/ML IV BOLUS
INTRAVENOUS | Status: DC | PRN
  Administered 2018-08-21: 150 mg via INTRAVENOUS

## 2018-08-21 MED ORDER — FENTANYL CITRATE (PF) 250 MCG/5ML IJ SOLN
INTRAMUSCULAR | Status: DC | PRN
  Administered 2018-08-21 (×2): 50 ug via INTRAVENOUS
  Administered 2018-08-21: 100 ug via INTRAVENOUS

## 2018-08-21 MED ORDER — SODIUM CHLORIDE 0.9 % IV SOLN
1.0000 g | INTRAVENOUS | Status: AC
  Administered 2018-08-21: 1000 mg via INTRAVENOUS
  Filled 2018-08-21: qty 1

## 2018-08-21 MED ORDER — PROPOFOL 10 MG/ML IV BOLUS
INTRAVENOUS | Status: AC
  Filled 2018-08-21: qty 20

## 2018-08-21 MED ORDER — ROCURONIUM BROMIDE 50 MG/5ML IV SOLN
INTRAVENOUS | Status: AC
  Filled 2018-08-21: qty 1

## 2018-08-21 MED ORDER — ROCURONIUM BROMIDE 10 MG/ML (PF) SYRINGE
PREFILLED_SYRINGE | INTRAVENOUS | Status: DC | PRN
  Administered 2018-08-21: 40 mg via INTRAVENOUS

## 2018-08-21 MED ORDER — LIDOCAINE 2% (20 MG/ML) 5 ML SYRINGE
INTRAMUSCULAR | Status: DC | PRN
  Administered 2018-08-21: 50 mg via INTRAVENOUS

## 2018-08-21 MED ORDER — SUGAMMADEX SODIUM 200 MG/2ML IV SOLN
INTRAVENOUS | Status: AC
  Filled 2018-08-21: qty 2

## 2018-08-21 MED ORDER — HYDROMORPHONE HCL 1 MG/ML IJ SOLN
0.2500 mg | INTRAMUSCULAR | Status: DC | PRN
  Administered 2018-08-21 (×2): 0.5 mg via INTRAVENOUS
  Filled 2018-08-21 (×2): qty 0.5

## 2018-08-21 MED ORDER — DEXAMETHASONE SODIUM PHOSPHATE 10 MG/ML IJ SOLN
INTRAMUSCULAR | Status: DC | PRN
  Administered 2018-08-21: 4 mg via INTRAVENOUS

## 2018-08-21 MED ORDER — MIDAZOLAM HCL 2 MG/2ML IJ SOLN: 0.5000 mg | Freq: Once | INTRAMUSCULAR | Status: DC | PRN

## 2018-08-21 MED ORDER — ONDANSETRON HCL 4 MG/2ML IJ SOLN
INTRAMUSCULAR | Status: AC
  Filled 2018-08-21: qty 2

## 2018-08-21 SURGICAL SUPPLY — 48 items
ADH SKN CLS APL DERMABOND .7 (GAUZE/BANDAGES/DRESSINGS) ×1
APL SRG 38 LTWT LNG FL B (MISCELLANEOUS) ×1
APPLICATOR ARISTA FLEXITIP XL (MISCELLANEOUS) ×1 IMPLANT
APPLIER CLIP ROT 10 11.4 M/L (STAPLE) ×2
APR CLP MED LRG 11.4X10 (STAPLE) ×1
BAG RETRIEVAL 10 (BASKET) ×1
BLADE SURG 15 STRL LF DISP TIS (BLADE) ×1 IMPLANT
BLADE SURG 15 STRL SS (BLADE) ×2
CHLORAPREP W/TINT 26ML (MISCELLANEOUS) ×2 IMPLANT
CLIP APPLIE ROT 10 11.4 M/L (STAPLE) ×1 IMPLANT
CLOTH BEACON ORANGE TIMEOUT ST (SAFETY) ×2 IMPLANT
COVER LIGHT HANDLE STERIS (MISCELLANEOUS) ×4 IMPLANT
DECANTER SPIKE VIAL GLASS SM (MISCELLANEOUS) ×2 IMPLANT
DERMABOND ADVANCED (GAUZE/BANDAGES/DRESSINGS) ×1
DERMABOND ADVANCED .7 DNX12 (GAUZE/BANDAGES/DRESSINGS) ×1 IMPLANT
ELECT REM PT RETURN 9FT ADLT (ELECTROSURGICAL) ×2
ELECTRODE REM PT RTRN 9FT ADLT (ELECTROSURGICAL) ×1 IMPLANT
FILTER SMOKE EVAC LAPAROSHD (FILTER) ×2 IMPLANT
GLOVE BIO SURGEON STRL SZ 6.5 (GLOVE) ×2 IMPLANT
GLOVE BIOGEL PI IND STRL 6.5 (GLOVE) ×1 IMPLANT
GLOVE BIOGEL PI IND STRL 7.0 (GLOVE) ×3 IMPLANT
GLOVE BIOGEL PI INDICATOR 6.5 (GLOVE) ×1
GLOVE BIOGEL PI INDICATOR 7.0 (GLOVE) ×1
GOWN STRL REUS W/TWL LRG LVL3 (GOWN DISPOSABLE) ×6 IMPLANT
HEMOSTAT ARISTA ABSORB 3G PWDR (MISCELLANEOUS) ×1 IMPLANT
HEMOSTAT SNOW SURGICEL 2X4 (HEMOSTASIS) ×2 IMPLANT
INST SET LAPROSCOPIC AP (KITS) ×2 IMPLANT
IV NS IRRIG 3000ML ARTHROMATIC (IV SOLUTION) IMPLANT
KIT TURNOVER KIT A (KITS) ×2 IMPLANT
MANIFOLD NEPTUNE II (INSTRUMENTS) ×2 IMPLANT
NDL INSUFFLATION 14GA 120MM (NEEDLE) ×1 IMPLANT
NEEDLE INSUFFLATION 14GA 120MM (NEEDLE) ×2 IMPLANT
NS IRRIG 1000ML POUR BTL (IV SOLUTION) ×2 IMPLANT
PACK LAP CHOLE LZT030E (CUSTOM PROCEDURE TRAY) ×2 IMPLANT
PAD ARMBOARD 7.5X6 YLW CONV (MISCELLANEOUS) ×2 IMPLANT
SET BASIN LINEN APH (SET/KITS/TRAYS/PACK) ×2 IMPLANT
SET TUBE IRRIG SUCTION NO TIP (IRRIGATION / IRRIGATOR) IMPLANT
SLEEVE ENDOPATH XCEL 5M (ENDOMECHANICALS) ×2 IMPLANT
SUT MNCRL AB 4-0 PS2 18 (SUTURE) ×3 IMPLANT
SUT VICRYL 0 UR6 27IN ABS (SUTURE) ×2 IMPLANT
SYS BAG RETRIEVAL 10MM (BASKET) ×1
SYSTEM BAG RETRIEVAL 10MM (BASKET) ×1 IMPLANT
TROCAR ENDO BLADELESS 11MM (ENDOMECHANICALS) ×2 IMPLANT
TROCAR XCEL NON-BLD 5MMX100MML (ENDOMECHANICALS) ×2 IMPLANT
TROCAR XCEL UNIV SLVE 11M 100M (ENDOMECHANICALS) ×2 IMPLANT
TUBE CONNECTING 12X1/4 (SUCTIONS) ×2 IMPLANT
TUBING INSUFFLATION (TUBING) ×2 IMPLANT
WARMER LAPAROSCOPE (MISCELLANEOUS) ×2 IMPLANT

## 2018-08-21 NOTE — Telephone Encounter (Signed)
Notified Dr. Bobby Rumpf of Urine Culture results with ESBL E Coli.  Lap chole completed without issue. Invanz given preoperatively to cover the E coli. Patient not symptomatic but likely colonized. Let Dr. Bobby Rumpf know about the Urine given history and families concern for past episodes of UTI/ bacteremia.   Dr. Bobby Rumpf agrees about colonization. He will see her in 1 week. He is unaware of the skin lesion on the right flank. We may end up and have to do a biopsy if we cannot locate her prior records.   Curlene Labrum, MD [facility] [address] [place], [place] [address] [phone] (office)

## 2018-08-21 NOTE — Discharge Instructions (Signed)
Discharge Instructions: Start your aspirin back in 48 hours after your surgery.  Shower per your regular routine. Take tylenol and ibuprofen as needed for pain control, alternating every 4-6 hours.  Take Norco for breakthrough pain. Take colace for constipation related to narcotic pain medication. Do not pick at the dermabond glue on your incision sites.  Go to the ED with any symptoms related to your UTIs in the past.  You have been given antibiotics to cover the E coli in the urine during your surgery.

## 2018-08-21 NOTE — Addendum Note (Signed)
Addendum  created 08/21/18 1104 by Talbot Grumbling, CRNA   Intraprocedure Meds edited

## 2018-08-21 NOTE — Transfer of Care (Signed)
Immediate Anesthesia Transfer of Care Note  Patient: Courtney Orozco  Procedure(s) Performed: LAPAROSCOPIC CHOLECYSTECTOMY (N/A Abdomen)  Patient Location: PACU  Anesthesia Type:General  Level of Consciousness: sedated  Airway & Oxygen Therapy: Patient Spontanous Breathing  Post-op Assessment: Report given to RN and Post -op Vital signs reviewed and stable  Post vital signs: Reviewed and stable  Last Vitals:  Vitals Value Taken Time  BP    Temp    Pulse 95 08/21/2018  8:43 AM  Resp 15 08/21/2018  8:43 AM  SpO2 89 % 08/21/2018  8:43 AM  Vitals shown include unvalidated device data.  Last Pain:  Vitals:   08/21/18 0641  TempSrc: Oral  PainSc: 4       Patients Stated Pain Goal: 5 (44/17/12 7871)  Complications: No apparent anesthesia complications

## 2018-08-21 NOTE — Anesthesia Postprocedure Evaluation (Signed)
Anesthesia Post Note  Patient: Courtney Orozco  Procedure(s) Performed: LAPAROSCOPIC CHOLECYSTECTOMY (N/A Abdomen)  Patient location during evaluation: PACU Anesthesia Type: General Level of consciousness: awake and alert Pain management: pain level controlled Vital Signs Assessment: post-procedure vital signs reviewed and stable Respiratory status: spontaneous breathing Cardiovascular status: blood pressure returned to baseline Postop Assessment: no apparent nausea or vomiting Anesthetic complications: no     Last Vitals:  Vitals:   08/21/18 0900 08/21/18 0915  BP: (!) 144/93   Pulse: 92 94  Resp: 15 16  Temp:    SpO2: 95%     Last Pain:  Vitals:   08/21/18 0915  TempSrc:   PainSc: Asleep                 Avid Guillette

## 2018-08-21 NOTE — Anesthesia Preprocedure Evaluation (Addendum)
Anesthesia Evaluation  Patient identified by MRN, date of birth, ID band Patient awake    Reviewed: Allergy & Precautions, NPO status , Patient's Chart, lab work & pertinent test results  History of Anesthesia Complications (+) PONV  Airway Mallampati: I  TM Distance: >3 FB Neck ROM: Full    Dental no notable dental hx. (+) Edentulous Upper, Edentulous Lower   Pulmonary shortness of breath, pneumonia, resolved, Current Smoker,    Pulmonary exam normal breath sounds clear to auscultation       Cardiovascular Exercise Tolerance: Good hypertension, Pt. on medications negative cardio ROS Normal cardiovascular examI Rhythm:Regular Rate:Normal     Neuro/Psych  Headaches, Anxiety Depression TIAnegative psych ROS   GI/Hepatic GERD  Medicated and Controlled,(+) Hepatitis -, B  Endo/Other  diabetesHypothyroidism Diet controlled  Renal/GU Renal InsufficiencyRenal diseaseChronic UTIs - Dr. B aware -WTP H/o Sepsis x3 this year all presumed urinary in origin  negative genitourinary   Musculoskeletal  (+) Arthritis , Osteoarthritis,    Abdominal   Peds negative pediatric ROS (+)  Hematology negative hematology ROS (+)   Anesthesia Other Findings   Reproductive/Obstetrics negative OB ROS                            Anesthesia Physical Anesthesia Plan  ASA: III  Anesthesia Plan: General   Post-op Pain Management:    Induction: Intravenous  PONV Risk Score and Plan:   Airway Management Planned: Oral ETT  Additional Equipment:   Intra-op Plan:   Post-operative Plan: Extubation in OR  Informed Consent: I have reviewed the patients History and Physical, chart, labs and discussed the procedure including the risks, benefits and alternatives for the proposed anesthesia with the patient or authorized representative who has indicated his/her understanding and acceptance.   Dental advisory  given  Plan Discussed with: CRNA  Anesthesia Plan Comments:         Anesthesia Quick Evaluation

## 2018-08-21 NOTE — Op Note (Signed)
Operative Note   Preoperative Diagnosis: Symptomatic cholelithiasis   Postoperative Diagnosis: Same   Procedure(s) Performed: Laparoscopic cholecystectomy   Surgeon: Ria Comment C. Constance Haw, MD   Assistants: Aviva Signs, MD   Anesthesia: General endotracheal   Anesthesiologist: Lenice Llamas, MD    Specimens: Gallbladder    Estimated Blood Loss: 10cc     Blood Replacement: None    Complications: None    Operative Findings: Distended gallbladder with sludge and tiny stones    Procedure: The patient was taken to the operating room and placed supine. General endotracheal anesthesia was induced. Intravenous antibiotics were administered per protocol, which covered her ESBL E coli that was found in her urine from chronic UTI/ coloniziation. An orogastric tube positioned to decompress the stomach. The abdomen was prepared and draped in the usual sterile fashion.    A supraumbilical incision was made and a Veress technique was utilized to achieve pneumoperitoneum to 15 mmHg with carbon dioxide. A 11 mm optiview port was placed through the supraumbilical region, and a 10 mm 0-degree operative laparoscope was introduced. The area underlying the trocar and Veress needle were inspected and without evidence of injury.  Remaining trocars were placed under direct vision. Two 5 mm ports were placed in the right abdomen, between the anterior axillary and midclavicular line.  A final 11 mm port was placed through the mid-epigastrium, near the falciform ligament.    The gallbladder fundus was elevated cephalad and the infundibulum was retracted to the patient's right. The lateral side wall was dissected to allow for better retraction given a redundant caudate lobe that was hanging over the anterior wall of the gallbladder. The gallbladder was essentially dissected off the hepatic bed as this was carried over the dome and medially in a dome down like approach given the thin / elongated mesentery.  The  gallbladder/cystic duct junction was skeletonized. The cystic artery noted in the triangle of Calot and was also skeletonized.   The cystic duct was triply clipped, the cystic artery was doubly clipped and divided. The specimen was placed in an Endopouch and was retrieved through the epigastric site. There was some generalized oozing that was controlled with cautery.    Final inspection revealed acceptable hemostasis. Surgicel and Arista was placed in the gallbladder bed.  Trocars were removed and pneumoperitoneum was released. The epigastric port site was overlying a rib on desufflation and the umbilical site was 0 Vicryl fascial sutures were used to close the epigastric and umbilical port site was smaller than my finger and tangential. Skin incisions were closed with 4-0 Monocryl subcuticular sutures and Dermabond. The patient was awakened from anesthesia and extubated without complication.    Curlene Labrum, MD [facility] [address] [place], [place] [address] [phone] (office)

## 2018-08-21 NOTE — Interval H&P Note (Signed)
History and Physical Interval Note:  08/21/2018 7:23 AM  Courtney Orozco  has presented today for surgery, with the diagnosis of cholelithiasis  The various methods of treatment have been discussed with the patient and family. After consideration of risks, benefits and other options for treatment, the patient has consented to  Procedure(s): LAPAROSCOPIC CHOLECYSTECTOMY (N/A) as a surgical intervention .  The patient's history has been reviewed, patient examined, no change in status, stable for surgery.  I have reviewed the patient's chart and labs.  Questions were answered to the patient's satisfaction.    No UTI symptoms, no pain, no frequency, no hesitancy, no altered mental status, etc. She is likely colonized with E coli in urine given the specimen.Giving Invanz today for preoperative antibiotics to cover this bug. Will let Dr. Alyson Ingles and PCP know about E coli in urine and lack of symptoms.   Patient made me aware of area on the right side that is an ulcerated area of the skin. She reports getting it biopsied in the past and that it was "fine." no biopsy results in the system. Will also have patient follow up with PCP regarding this area. If needed will alter port placement pending location of area once insufflated.   Virl Cagey

## 2018-08-21 NOTE — Anesthesia Procedure Notes (Signed)
Procedure Name: Intubation Date/Time: 08/21/2018 7:46 AM Performed by: Talbot Grumbling, CRNA Pre-anesthesia Checklist: Patient identified, Emergency Drugs available, Suction available and Patient being monitored Patient Re-evaluated:Patient Re-evaluated prior to induction Oxygen Delivery Method: Circle system utilized Preoxygenation: Pre-oxygenation with 100% oxygen Induction Type: IV induction Ventilation: Mask ventilation without difficulty Laryngoscope Size: Mac and 3 Grade View: Grade I Tube type: Oral Tube size: 7.0 mm Number of attempts: 1 Airway Equipment and Method: Stylet Placement Confirmation: ETT inserted through vocal cords under direct vision,  positive ETCO2 and breath sounds checked- equal and bilateral Secured at: 22 cm Tube secured with: Tape Dental Injury: Teeth and Oropharynx as per pre-operative assessment

## 2018-08-22 ENCOUNTER — Encounter (HOSPITAL_COMMUNITY): Payer: Self-pay | Admitting: General Surgery

## 2018-08-27 DIAGNOSIS — K8020 Calculus of gallbladder without cholecystitis without obstruction: Secondary | ICD-10-CM | POA: Diagnosis not present

## 2018-08-27 DIAGNOSIS — Z79899 Other long term (current) drug therapy: Secondary | ICD-10-CM | POA: Diagnosis not present

## 2018-08-27 DIAGNOSIS — N390 Urinary tract infection, site not specified: Secondary | ICD-10-CM | POA: Diagnosis not present

## 2018-08-27 DIAGNOSIS — Z09 Encounter for follow-up examination after completed treatment for conditions other than malignant neoplasm: Secondary | ICD-10-CM | POA: Diagnosis not present

## 2018-08-27 DIAGNOSIS — L989 Disorder of the skin and subcutaneous tissue, unspecified: Secondary | ICD-10-CM | POA: Diagnosis not present

## 2018-09-04 DIAGNOSIS — M130 Polyarthritis, unspecified: Secondary | ICD-10-CM | POA: Diagnosis not present

## 2018-09-04 DIAGNOSIS — G43709 Chronic migraine without aura, not intractable, without status migrainosus: Secondary | ICD-10-CM | POA: Diagnosis not present

## 2018-09-04 DIAGNOSIS — R2689 Other abnormalities of gait and mobility: Secondary | ICD-10-CM | POA: Diagnosis not present

## 2018-09-04 DIAGNOSIS — G3184 Mild cognitive impairment, so stated: Secondary | ICD-10-CM | POA: Diagnosis not present

## 2018-09-05 ENCOUNTER — Ambulatory Visit (INDEPENDENT_AMBULATORY_CARE_PROVIDER_SITE_OTHER): Payer: Self-pay | Admitting: General Surgery

## 2018-09-05 ENCOUNTER — Encounter: Payer: Self-pay | Admitting: General Surgery

## 2018-09-05 VITALS — BP 138/63 | HR 91 | Temp 96.9°F | Resp 20 | Wt 182.0 lb

## 2018-09-05 DIAGNOSIS — K8020 Calculus of gallbladder without cholecystitis without obstruction: Secondary | ICD-10-CM

## 2018-09-05 NOTE — Patient Instructions (Signed)
Activity as tolerated

## 2018-09-05 NOTE — Progress Notes (Signed)
[facility] Note   HPI:  67 y.o. Female presents to clinic for post-op follow-up evaluation after her laparoscopic cholecystectomy. Patient reports she has no pain and is doing well. She has no complaints. She did not have issues with UTI.  Review of Systems:  No fevers or chills No UTI symptoms All other review of systems: otherwise negative   Pathology: Diagnosis Gallbladder - CHRONIC CHOLECYSTITIS WITH CHOLELITHIASIS.  Vital Signs:  BP 138/63 (BP Location: Left Arm, Patient Position: Sitting, Cuff Size: Normal)   Pulse 91   Temp (!) 96.9 F (36.1 C) (Temporal)   Resp 20   Wt 182 lb (82.6 kg)   BMI 31.24 kg/m    Physical Exam:  Physical Exam  Constitutional: She appears well-developed.  HENT:  Head: Normocephalic.  Pulmonary/Chest: Effort normal.  Abdominal: Soft. She exhibits no distension. There is no tenderness.  Port sites healed, no erythema or drainage, right side with skin ulceration -chronic    Laboratory studies: None  Imaging:  None    Assessment:  67 y.o. yo Female s/p laparoscopic cholecystectomy doing well.    Plan:  -  Activity as tolerated   - Chronic skin ulceration, previously biopsied by dermatologist, but family and patient unsure of what results were but reported benign. Given chronicity and unsure exactly what was done, will plan for excision in January under local in minor procedure room.  - Patient will call in January for appt   All of the above recommendations were discussed with the patient and patient's family, and all of patient's and family's questions were answered to their expressed satisfaction.  Curlene Labrum, MD [facility] [address] [place], [place] [address] [phone] (office)

## 2018-09-25 DIAGNOSIS — Z79899 Other long term (current) drug therapy: Secondary | ICD-10-CM | POA: Diagnosis not present

## 2018-09-25 DIAGNOSIS — L989 Disorder of the skin and subcutaneous tissue, unspecified: Secondary | ICD-10-CM | POA: Diagnosis not present

## 2018-09-25 DIAGNOSIS — R7301 Impaired fasting glucose: Secondary | ICD-10-CM | POA: Diagnosis not present

## 2018-09-25 DIAGNOSIS — N390 Urinary tract infection, site not specified: Secondary | ICD-10-CM | POA: Diagnosis not present

## 2018-09-25 DIAGNOSIS — R609 Edema, unspecified: Secondary | ICD-10-CM | POA: Diagnosis not present

## 2018-09-25 DIAGNOSIS — E039 Hypothyroidism, unspecified: Secondary | ICD-10-CM | POA: Diagnosis not present

## 2018-11-06 ENCOUNTER — Other Ambulatory Visit (HOSPITAL_COMMUNITY)
Admission: AD | Admit: 2018-11-06 | Discharge: 2018-11-06 | Disposition: A | Discharge status: Home / Self Care | Payer: PPO | Source: Other Acute Inpatient Hospital | Attending: Urology | Admitting: Urology

## 2018-11-06 ENCOUNTER — Ambulatory Visit: Payer: PPO | Admitting: Urology

## 2018-11-06 DIAGNOSIS — N3000 Acute cystitis without hematuria: Secondary | ICD-10-CM | POA: Diagnosis not present

## 2018-11-06 DIAGNOSIS — N200 Calculus of kidney: Secondary | ICD-10-CM | POA: Diagnosis not present

## 2018-11-08 LAB — URINE CULTURE

## 2018-11-12 DIAGNOSIS — G43709 Chronic migraine without aura, not intractable, without status migrainosus: Secondary | ICD-10-CM | POA: Diagnosis not present

## 2018-11-12 DIAGNOSIS — R2689 Other abnormalities of gait and mobility: Secondary | ICD-10-CM | POA: Diagnosis not present

## 2018-11-12 DIAGNOSIS — G3184 Mild cognitive impairment, so stated: Secondary | ICD-10-CM | POA: Diagnosis not present

## 2018-11-12 DIAGNOSIS — M130 Polyarthritis, unspecified: Secondary | ICD-10-CM | POA: Diagnosis not present

## 2018-12-04 DIAGNOSIS — N390 Urinary tract infection, site not specified: Secondary | ICD-10-CM | POA: Diagnosis not present

## 2018-12-04 DIAGNOSIS — F419 Anxiety disorder, unspecified: Secondary | ICD-10-CM | POA: Diagnosis not present

## 2018-12-04 DIAGNOSIS — F329 Major depressive disorder, single episode, unspecified: Secondary | ICD-10-CM | POA: Diagnosis not present

## 2018-12-04 DIAGNOSIS — E039 Hypothyroidism, unspecified: Secondary | ICD-10-CM | POA: Diagnosis not present

## 2018-12-04 DIAGNOSIS — G8929 Other chronic pain: Secondary | ICD-10-CM | POA: Diagnosis not present

## 2018-12-04 DIAGNOSIS — M545 Low back pain: Secondary | ICD-10-CM | POA: Diagnosis not present

## 2018-12-04 DIAGNOSIS — Z79899 Other long term (current) drug therapy: Secondary | ICD-10-CM | POA: Diagnosis not present

## 2018-12-04 DIAGNOSIS — I10 Essential (primary) hypertension: Secondary | ICD-10-CM | POA: Diagnosis not present

## 2019-02-28 DIAGNOSIS — I10 Essential (primary) hypertension: Secondary | ICD-10-CM | POA: Diagnosis not present

## 2019-02-28 DIAGNOSIS — G8929 Other chronic pain: Secondary | ICD-10-CM | POA: Diagnosis not present

## 2019-02-28 DIAGNOSIS — Z79899 Other long term (current) drug therapy: Secondary | ICD-10-CM | POA: Diagnosis not present

## 2019-02-28 DIAGNOSIS — M545 Low back pain: Secondary | ICD-10-CM | POA: Diagnosis not present

## 2019-02-28 DIAGNOSIS — E039 Hypothyroidism, unspecified: Secondary | ICD-10-CM | POA: Diagnosis not present

## 2019-02-28 DIAGNOSIS — F329 Major depressive disorder, single episode, unspecified: Secondary | ICD-10-CM | POA: Diagnosis not present

## 2019-02-28 DIAGNOSIS — R7301 Impaired fasting glucose: Secondary | ICD-10-CM | POA: Diagnosis not present

## 2019-02-28 DIAGNOSIS — F419 Anxiety disorder, unspecified: Secondary | ICD-10-CM | POA: Diagnosis not present

## 2019-03-12 ENCOUNTER — Other Ambulatory Visit (HOSPITAL_COMMUNITY): Payer: Self-pay | Admitting: Urology

## 2019-03-12 ENCOUNTER — Other Ambulatory Visit: Payer: Self-pay

## 2019-03-12 ENCOUNTER — Ambulatory Visit (HOSPITAL_COMMUNITY)
Admission: RE | Admit: 2019-03-12 | Discharge: 2019-03-12 | Disposition: A | Discharge status: Home / Self Care | Payer: PPO | Source: Ambulatory Visit | Attending: Urology | Admitting: Urology

## 2019-03-12 DIAGNOSIS — N200 Calculus of kidney: Secondary | ICD-10-CM | POA: Diagnosis not present

## 2019-03-12 DIAGNOSIS — R319 Hematuria, unspecified: Secondary | ICD-10-CM | POA: Diagnosis not present

## 2019-04-24 DIAGNOSIS — F17210 Nicotine dependence, cigarettes, uncomplicated: Secondary | ICD-10-CM | POA: Diagnosis not present

## 2019-04-24 DIAGNOSIS — Z881 Allergy status to other antibiotic agents status: Secondary | ICD-10-CM | POA: Diagnosis not present

## 2019-04-24 DIAGNOSIS — J019 Acute sinusitis, unspecified: Secondary | ICD-10-CM | POA: Diagnosis not present

## 2019-05-12 DIAGNOSIS — G3184 Mild cognitive impairment, so stated: Secondary | ICD-10-CM | POA: Diagnosis not present

## 2019-05-12 DIAGNOSIS — M130 Polyarthritis, unspecified: Secondary | ICD-10-CM | POA: Diagnosis not present

## 2019-05-12 DIAGNOSIS — R2689 Other abnormalities of gait and mobility: Secondary | ICD-10-CM | POA: Diagnosis not present

## 2019-05-12 DIAGNOSIS — G43709 Chronic migraine without aura, not intractable, without status migrainosus: Secondary | ICD-10-CM | POA: Diagnosis not present

## 2019-05-30 DIAGNOSIS — I10 Essential (primary) hypertension: Secondary | ICD-10-CM | POA: Diagnosis not present

## 2019-05-30 DIAGNOSIS — Z0000 Encounter for general adult medical examination without abnormal findings: Secondary | ICD-10-CM | POA: Diagnosis not present

## 2019-05-30 DIAGNOSIS — F419 Anxiety disorder, unspecified: Secondary | ICD-10-CM | POA: Diagnosis not present

## 2019-05-30 DIAGNOSIS — G4700 Insomnia, unspecified: Secondary | ICD-10-CM | POA: Diagnosis not present

## 2019-05-30 DIAGNOSIS — R609 Edema, unspecified: Secondary | ICD-10-CM | POA: Diagnosis not present

## 2019-05-30 DIAGNOSIS — M545 Low back pain: Secondary | ICD-10-CM | POA: Diagnosis not present

## 2019-05-30 DIAGNOSIS — Z79899 Other long term (current) drug therapy: Secondary | ICD-10-CM | POA: Diagnosis not present

## 2019-05-30 DIAGNOSIS — Z23 Encounter for immunization: Secondary | ICD-10-CM | POA: Diagnosis not present

## 2019-05-30 DIAGNOSIS — R6889 Other general symptoms and signs: Secondary | ICD-10-CM | POA: Diagnosis not present

## 2019-05-30 DIAGNOSIS — R7301 Impaired fasting glucose: Secondary | ICD-10-CM | POA: Diagnosis not present

## 2019-05-30 DIAGNOSIS — E785 Hyperlipidemia, unspecified: Secondary | ICD-10-CM | POA: Diagnosis not present

## 2019-05-30 DIAGNOSIS — N183 Chronic kidney disease, stage 3 (moderate): Secondary | ICD-10-CM | POA: Diagnosis not present

## 2019-05-30 DIAGNOSIS — E039 Hypothyroidism, unspecified: Secondary | ICD-10-CM | POA: Diagnosis not present

## 2019-06-05 ENCOUNTER — Other Ambulatory Visit (HOSPITAL_COMMUNITY): Payer: Self-pay | Admitting: Internal Medicine

## 2019-06-05 DIAGNOSIS — Z1231 Encounter for screening mammogram for malignant neoplasm of breast: Secondary | ICD-10-CM

## 2019-06-11 ENCOUNTER — Ambulatory Visit (INDEPENDENT_AMBULATORY_CARE_PROVIDER_SITE_OTHER): Payer: PPO | Admitting: Urology

## 2019-06-11 DIAGNOSIS — N200 Calculus of kidney: Secondary | ICD-10-CM | POA: Diagnosis not present

## 2019-06-11 DIAGNOSIS — N3000 Acute cystitis without hematuria: Secondary | ICD-10-CM

## 2019-07-02 ENCOUNTER — Other Ambulatory Visit: Payer: Self-pay

## 2019-07-02 ENCOUNTER — Ambulatory Visit (HOSPITAL_COMMUNITY)
Admission: RE | Admit: 2019-07-02 | Discharge: 2019-07-02 | Disposition: A | Discharge status: Home / Self Care | Payer: PPO | Source: Ambulatory Visit | Attending: Internal Medicine | Admitting: Internal Medicine

## 2019-07-02 DIAGNOSIS — Z1231 Encounter for screening mammogram for malignant neoplasm of breast: Secondary | ICD-10-CM | POA: Insufficient documentation

## 2019-07-03 ENCOUNTER — Other Ambulatory Visit (HOSPITAL_COMMUNITY): Payer: Self-pay | Admitting: Internal Medicine

## 2019-07-07 ENCOUNTER — Other Ambulatory Visit (HOSPITAL_COMMUNITY): Payer: Self-pay | Admitting: Internal Medicine

## 2019-07-10 ENCOUNTER — Other Ambulatory Visit (HOSPITAL_COMMUNITY): Payer: Self-pay | Admitting: Internal Medicine

## 2019-07-10 DIAGNOSIS — R928 Other abnormal and inconclusive findings on diagnostic imaging of breast: Secondary | ICD-10-CM

## 2019-07-15 ENCOUNTER — Other Ambulatory Visit: Payer: Self-pay | Admitting: *Deleted

## 2019-07-15 DIAGNOSIS — R6889 Other general symptoms and signs: Secondary | ICD-10-CM | POA: Diagnosis not present

## 2019-07-15 DIAGNOSIS — Z20822 Contact with and (suspected) exposure to covid-19: Secondary | ICD-10-CM

## 2019-07-16 LAB — NOVEL CORONAVIRUS, NAA: SARS-CoV-2, NAA: DETECTED — AB

## 2019-07-22 ENCOUNTER — Other Ambulatory Visit (HOSPITAL_COMMUNITY): Payer: PPO

## 2019-07-22 ENCOUNTER — Encounter (HOSPITAL_COMMUNITY): Payer: PPO

## 2019-07-22 ENCOUNTER — Encounter (HOSPITAL_COMMUNITY): Payer: Self-pay

## 2019-08-12 ENCOUNTER — Ambulatory Visit (HOSPITAL_COMMUNITY): Payer: PPO

## 2019-08-12 ENCOUNTER — Encounter (HOSPITAL_COMMUNITY): Payer: PPO

## 2019-08-12 ENCOUNTER — Other Ambulatory Visit (HOSPITAL_COMMUNITY): Payer: PPO

## 2019-08-12 ENCOUNTER — Other Ambulatory Visit: Payer: Self-pay

## 2019-08-12 ENCOUNTER — Ambulatory Visit (HOSPITAL_COMMUNITY)
Admission: RE | Admit: 2019-08-12 | Discharge: 2019-08-12 | Disposition: A | Discharge status: Home / Self Care | Payer: PPO | Source: Ambulatory Visit | Attending: Internal Medicine | Admitting: Internal Medicine

## 2019-08-12 DIAGNOSIS — R928 Other abnormal and inconclusive findings on diagnostic imaging of breast: Secondary | ICD-10-CM | POA: Diagnosis not present

## 2019-08-19 ENCOUNTER — Inpatient Hospital Stay (HOSPITAL_COMMUNITY): Admission: RE | Admit: 2019-08-19 | Payer: PPO | Source: Ambulatory Visit

## 2019-08-19 ENCOUNTER — Other Ambulatory Visit (HOSPITAL_COMMUNITY): Payer: PPO

## 2019-08-25 ENCOUNTER — Emergency Department (HOSPITAL_COMMUNITY): Payer: PPO

## 2019-08-25 ENCOUNTER — Encounter (HOSPITAL_COMMUNITY): Payer: Self-pay

## 2019-08-25 ENCOUNTER — Emergency Department (HOSPITAL_COMMUNITY)
Admission: EM | Admit: 2019-08-25 | Discharge: 2019-08-25 | Discharge status: Against Medical Advice | Payer: PPO | Attending: Emergency Medicine | Admitting: Emergency Medicine

## 2019-08-25 ENCOUNTER — Other Ambulatory Visit: Payer: Self-pay

## 2019-08-25 DIAGNOSIS — R05 Cough: Secondary | ICD-10-CM | POA: Insufficient documentation

## 2019-08-25 DIAGNOSIS — Z5320 Procedure and treatment not carried out because of patient's decision for unspecified reasons: Secondary | ICD-10-CM | POA: Insufficient documentation

## 2019-08-25 DIAGNOSIS — Z79899 Other long term (current) drug therapy: Secondary | ICD-10-CM | POA: Diagnosis not present

## 2019-08-25 DIAGNOSIS — R059 Cough, unspecified: Secondary | ICD-10-CM

## 2019-08-25 DIAGNOSIS — R0602 Shortness of breath: Secondary | ICD-10-CM | POA: Insufficient documentation

## 2019-08-25 DIAGNOSIS — Z7982 Long term (current) use of aspirin: Secondary | ICD-10-CM | POA: Insufficient documentation

## 2019-08-25 DIAGNOSIS — F17210 Nicotine dependence, cigarettes, uncomplicated: Secondary | ICD-10-CM | POA: Diagnosis not present

## 2019-08-25 DIAGNOSIS — E039 Hypothyroidism, unspecified: Secondary | ICD-10-CM | POA: Insufficient documentation

## 2019-08-25 DIAGNOSIS — Z20828 Contact with and (suspected) exposure to other viral communicable diseases: Secondary | ICD-10-CM | POA: Diagnosis not present

## 2019-08-25 DIAGNOSIS — E119 Type 2 diabetes mellitus without complications: Secondary | ICD-10-CM | POA: Insufficient documentation

## 2019-08-25 DIAGNOSIS — Z8673 Personal history of transient ischemic attack (TIA), and cerebral infarction without residual deficits: Secondary | ICD-10-CM | POA: Insufficient documentation

## 2019-08-25 DIAGNOSIS — I4891 Unspecified atrial fibrillation: Secondary | ICD-10-CM | POA: Diagnosis not present

## 2019-08-25 LAB — BASIC METABOLIC PANEL
Anion gap: 12 (ref 5–15)
BUN: 15 mg/dL (ref 8–23)
CO2: 26 mmol/L (ref 22–32)
Calcium: 9.5 mg/dL (ref 8.9–10.3)
Chloride: 101 mmol/L (ref 98–111)
Creatinine, Ser: 1.02 mg/dL — ABNORMAL HIGH (ref 0.44–1.00)
GFR calc Af Amer: >60 mL/min (ref >60)
GFR calc non Af Amer: 56 mL/min — ABNORMAL LOW (ref >60)
Glucose, Bld: 92 mg/dL (ref 70–99)
Potassium: 4.8 mmol/L (ref 3.5–5.1)
Sodium: 139 mmol/L (ref 135–145)

## 2019-08-25 LAB — CBC WITH DIFFERENTIAL/PLATELET
Abs Immature Granulocytes: 0.03 10*3/uL (ref 0.00–0.07)
Basophils Absolute: 0 10*3/uL (ref 0.0–0.1)
Basophils Relative: 1 %
Eosinophils Absolute: 0.2 10*3/uL (ref 0.0–0.5)
Eosinophils Relative: 2 %
HCT: 46.6 % — ABNORMAL HIGH (ref 36.0–46.0)
Hemoglobin: 14.9 g/dL (ref 12.0–15.0)
Immature Granulocytes: 0 %
Lymphocytes Relative: 34 %
Lymphs Abs: 3 10*3/uL (ref 0.7–4.0)
MCH: 30.8 pg (ref 26.0–34.0)
MCHC: 32 g/dL (ref 30.0–36.0)
MCV: 96.3 fL (ref 80.0–100.0)
Monocytes Absolute: 0.6 10*3/uL (ref 0.1–1.0)
Monocytes Relative: 7 %
Neutro Abs: 5 10*3/uL (ref 1.7–7.7)
Neutrophils Relative %: 56 %
Platelets: 275 10*3/uL (ref 150–400)
RBC: 4.84 MIL/uL (ref 3.87–5.11)
RDW: 13.1 % (ref 11.5–15.5)
WBC: 8.8 10*3/uL (ref 4.0–10.5)
nRBC: 0 % (ref 0.0–0.2)

## 2019-08-25 LAB — BRAIN NATRIURETIC PEPTIDE: B Natriuretic Peptide: 50 pg/mL (ref 0.0–100.0)

## 2019-08-25 MED ORDER — ALBUTEROL SULFATE HFA 108 (90 BASE) MCG/ACT IN AERS: 2.0000 | INHALATION_SPRAY | Freq: Once | RESPIRATORY_TRACT | Status: DC

## 2019-08-25 MED ORDER — BENZONATATE 200 MG PO CAPS: 200.0000 mg | ORAL_CAPSULE | Freq: Three times a day (TID) | ORAL | 0 refills | Status: DC | PRN

## 2019-08-25 MED ORDER — ALBUTEROL SULFATE HFA 108 (90 BASE) MCG/ACT IN AERS
6.0000 | INHALATION_SPRAY | Freq: Once | RESPIRATORY_TRACT | Status: AC
  Administered 2019-08-25: 6 via RESPIRATORY_TRACT
  Filled 2019-08-25: qty 6.7

## 2019-08-25 MED ORDER — PREDNISONE 20 MG PO TABS: 40.0000 mg | ORAL_TABLET | Freq: Every day | ORAL | 0 refills | Status: DC

## 2019-08-25 MED ORDER — PREDNISONE 20 MG PO TABS
40.0000 mg | ORAL_TABLET | Freq: Once | ORAL | Status: DC
  Filled 2019-08-25: qty 2

## 2019-08-25 NOTE — ED Triage Notes (Signed)
Pt reports had covid in Sept and tested negative 2 weeks ago.  PT says she started having cough and sob x 3 days.  Denies fever.  Reports her o2 sat at home was 85%.  PT not on home o2.   Pt c/o headache

## 2019-08-25 NOTE — ED Provider Notes (Signed)
[facility] EMERGENCY DEPARTMENT Provider Note   CSN: [id number] Arrival date & time: 08/25/19  [id number]     History   Chief Complaint Chief Complaint  Patient presents with   Cough   Shortness of Breath    HPI Courtney Orozco is a 68 y.o. female.     HPI   Courtney Orozco is a 68 y.o. female with hx of DM, HTN, hypothyroidism who presents to the Emergency Department complaining of cough, shortness of breath associated with cough, frontal headache and nasal congestion.  Symptoms have been present for 3 days.  States her oxygen at home was 85%.  She contacted her PCP and advised to come to ER for evaluation.  She states her cough has been mostly non-productive.  Reports having episodes of forceful coughing.  She states that she tested positive for COVID in September and felt that she recovered completely.  Had a repeat test at a local drug store 2 weeks ago and tested negative.  Headache described as gradual in onset and throbbing across her forehead.  No neck pain or stiffness, no visual changes.  No known recent COVID exposures.  She denies fever, vomiting, hemoptysis, chest pain, weakness, syncope, and body aches.    Past Medical History:  Diagnosis Date   Anxiety    Arthritis    Back pain    Depression    Diabetes mellitus without complication (HCC)    no meds in 5 years    Gait instability    GERD (gastroesophageal reflux disease)    Headache    hx of migraines    Hepatitis    hx of hep B - 44 years ago    History of kidney stones    Hypertension    Hypothyroidism    Memory loss    Pneumonia    hx of 01/2015    PONV (postoperative nausea and vomiting)    pt states "the last two times ive been put to sleep, my oxygen drops low and they have trouble waking me up.   Thyroid disease    TIA (transient ischemic attack)     Patient Active Problem List   Diagnosis Date Noted   Calculus of gallbladder without cholecystitis without obstruction 08/13/2018     Infection due to ESBL-producing Escherichia coli 07/08/2018   Sepsis secondary to UTI ([place]) 05/22/2018   GERD (gastroesophageal reflux disease) 05/22/2018   Hypothyroidism 05/22/2018   Type 2 diabetes mellitus ([place]) 05/22/2018   Anxiety and depression 05/22/2018   HCAP (healthcare-associated pneumonia) 05/22/2018   Chronic back pain 05/22/2018   Bacteremia [id number]   Acute metabolic encephalopathy 99991111   Shortness of breath 09/30/2017   ESBL E Coli- UTI (urinary tract infection) 09/28/2017   Leukocytosis 09/28/2017   Right ureteral stone    Syncope 01/22/2017   Renal calculus, right 04/07/2015   Renal stone 04/06/2015    Past Surgical History:  Procedure Laterality Date   ABDOMINAL HYSTERECTOMY     APPENDECTOMY     BACK SURGERY     x 4   bladder tack surgery      BREAST SURGERY Right    removal of 3 benign tumors   CATARACT EXTRACTION W/PHACO Left 04/30/2017   Procedure: CATARACT EXTRACTION PHACO AND INTRAOCULAR LENS PLACEMENT ([place]);  Surgeon: Tonny Branch, MD;  Location: [facility];  Service: Ophthalmology;  Laterality: Left;  CDE: 7.16   CATARACT EXTRACTION W/PHACO Right 05/14/2017   Procedure: CATARACT EXTRACTION PHACO AND INTRAOCULAR LENS PLACEMENT RIGHT  EYE;  Surgeon: Tonny Branch, MD;  Location: [facility];  Service: Ophthalmology;  Laterality: Right;  CDE: 6.63   CHOLECYSTECTOMY N/A 08/21/2018   Procedure: LAPAROSCOPIC CHOLECYSTECTOMY;  Surgeon: Virl Cagey, MD;  Location: [facility];  Service: General;  Laterality: N/A;   CYSTOSCOPY WITH STENT PLACEMENT Right 09/28/2017   Procedure: CYSTOSCOPY WITH STENT PLACEMENT;  Surgeon: Franchot Gallo, MD;  Location: [facility];  Service: Urology;  Laterality: Right;   CYSTOSCOPY WITH URETEROSCOPY, STONE BASKETRY AND STENT PLACEMENT Right 04/06/2015   Procedure: CYSTOSCOPY WITH RIGHT URETEROSCOPY, STONE BASKETRY AND STENT PLACEMENT;  Surgeon: Irine Seal, MD;  Location: [facility];  Service: Urology;  Laterality:  Right;   CYSTOSCOPY/RETROGRADE/URETEROSCOPY Right 11/28/2017   Procedure: CYSTOSCOPY,  REMOVAL OF RIGHT URETERAL STENT, RIGHT RETROGRADE PYELOGRAM, RIGHT URETEROSCOPY, STONE BASKET EXTRACTION RIGHT URETERAL CALCULUS;  Surgeon: Cleon Gustin, MD;  Location: [facility];  Service: Urology;  Laterality: Right;   HOLMIUM LASER APPLICATION Right 0000000   Procedure: HOLMIUM LASER APPLICATION;  Surgeon: Irine Seal, MD;  Location: [facility];  Service: Urology;  Laterality: Right;   KNEE SURGERY     left   ROTATOR CUFF REPAIR Left    thumb surgery     left     OB History   No obstetric history on file.      Home Medications    Prior to Admission medications   Medication Sig Start Date End Date Taking? Authorizing Provider  albuterol (PROVENTIL HFA;VENTOLIN HFA) 108 (90 Base) MCG/ACT inhaler Inhale 2 puffs into the lungs every 6 (six) hours as needed for wheezing or shortness of breath.   Yes [provider]  aspirin 325 MG tablet Take 325 mg by mouth daily.   Yes [provider]  aspirin-acetaminophen-caffeine (EXCEDRIN MIGRAINE) 620 151 9340 MG tablet Take 2 tablets by mouth daily as needed (pain).   Yes [provider]  bisacodyl (DULCOLAX) 5 MG EC tablet Take 5 mg by mouth daily as needed for moderate constipation.   Yes [provider]  buPROPion (WELLBUTRIN XL) 300 MG 24 hr tablet Take 300 mg by mouth daily.  02/10/15  Yes [provider]  diazepam (VALIUM) 5 MG tablet Take 5 mg by mouth every 8 (eight) hours as needed for anxiety or muscle spasms.  03/24/15  Yes [provider]  diphenhydrAMINE (BENADRYL) 25 MG tablet Take 25 mg by mouth daily.   Yes [provider]  Fluticasone-Salmeterol (ADVAIR DISKUS) 250-50 MCG/DOSE AEPB Inhale 1 puff into the lungs daily as needed (shortness of breath).  09/17/17  Yes [provider]  HYDROcodone-acetaminophen (NORCO/VICODIN) 5-325 MG tablet Take 1 tablet by mouth every 4 (four)  hours as needed for moderate pain. 11/28/17  Yes McKenzie, Candee Furbish, MD  levothyroxine (SYNTHROID, LEVOTHROID) 175 MCG tablet Take 175 mcg by mouth daily. Take one tablet with 137mcg tablet for total dose of 238mcg   Yes [provider]  Melatonin 10 MG TABS Take 30 mg by mouth at bedtime.   Yes [provider]  nortriptyline (PAMELOR) 50 MG capsule Take 50 mg by mouth at bedtime.   Yes [provider]  potassium chloride SA (KLOR-CON M20) 20 MEQ tablet Take 2 tablets (40 mEq total) by mouth 2 (two) times daily. Patient taking differently: Take 20 mEq by mouth 3 (three) times daily.  07/08/18  Yes Emokpae, Courage, MD  torsemide (DEMADEX) 20 MG tablet Take 10-20 mg by mouth at bedtime.  09/17/17  Yes [provider]  levothyroxine (SYNTHROID, LEVOTHROID) 100  MCG tablet Take 100 mcg by mouth daily. Take with 176mcg tablet for total dose of 266mcg daily    [provider]  zolpidem (AMBIEN) 10 MG tablet Take 10 mg by mouth at bedtime.  06/14/18   [provider]    Family History Family History  Problem Relation Age of Onset   COPD Father     Social History Social History   Tobacco Use   Smoking status: Current Every Day Smoker    Packs/day: 0.50    Years: 50.00    Pack years: 25.00    Types: Cigarettes   Smokeless tobacco: Never Used  Substance Use Topics   Alcohol use: No   Drug use: No     Allergies   Ciprofloxacin, Macrobid [nitrofurantoin], Azithromycin, and Sulfa antibiotics   Review of Systems Review of Systems  Constitutional: Negative for appetite change, chills and fever.  HENT: Positive for congestion. Negative for sore throat and trouble swallowing.   Eyes: Negative for photophobia and visual disturbance.  Respiratory: Positive for cough, shortness of breath and wheezing. Negative for chest tightness.   Cardiovascular: Negative for chest pain.  Gastrointestinal: Negative for abdominal pain, diarrhea, nausea  and vomiting.  Genitourinary: Negative for decreased urine volume, dysuria and frequency.  Musculoskeletal: Negative for arthralgias and myalgias.  Skin: Negative for rash.  Neurological: Positive for headaches. Negative for dizziness, seizures, syncope, facial asymmetry, speech difficulty, weakness and numbness.  Hematological: Negative for adenopathy.     Physical Exam Updated Vital Signs BP (!) 114/55    Pulse 72    Temp 97.9 F (36.6 C) (Oral)    Resp 20    Ht 5\' 5"  (1.651 m)    Wt 87.1 kg    SpO2 95%    BMI 31.95 kg/m   Physical Exam Vitals signs and nursing note reviewed.  Constitutional:      General: She is not in acute distress.    Appearance: She is well-developed. She is not toxic-appearing.  HENT:     Head: Normocephalic.     Mouth/Throat:     Mouth: Mucous membranes are moist.     Pharynx: Uvula midline. No oropharyngeal exudate or posterior oropharyngeal erythema.  Eyes:     Conjunctiva/sclera: Conjunctivae normal.     Pupils: Pupils are equal, round, and reactive to light.  Neck:     Musculoskeletal: Full passive range of motion without pain, normal range of motion and neck supple.     Trachea: Phonation normal.  Cardiovascular:     Rate and Rhythm: Normal rate and regular rhythm.     Pulses: Normal pulses.     Heart sounds: No murmur.  Pulmonary:     Effort: Pulmonary effort is normal. No respiratory distress.     Breath sounds: No stridor. Wheezing and rhonchi present. No rales.     Comments: Coarse rhonchi bilaterally with expiratory wheezes bilaterally.  Pt able to speak in full sentences w/o respiratory distress. Chest:     Chest wall: No tenderness.  Abdominal:     Palpations: Abdomen is soft.     Tenderness: There is no abdominal tenderness.  Musculoskeletal: Normal range of motion.     Right lower leg: No edema.     Left lower leg: No edema.  Lymphadenopathy:     Cervical: No cervical adenopathy.  Skin:    General: Skin is warm.     Capillary  Refill: Capillary refill takes less than 2 seconds.     Findings: No rash.  Neurological:     General: No focal deficit present.     Mental Status: She is alert and oriented to person, place, and time.     Sensory: No sensory deficit.     Motor: No weakness or abnormal muscle tone.      ED Treatments / Results  Labs (all labs ordered are listed, but only abnormal results are displayed) Labs Reviewed  BASIC METABOLIC PANEL - Abnormal; Notable for the following components:      Result Value   Creatinine, Ser 1.02 (*)    GFR calc non Af Amer 56 (*)    All other components within normal limits  CBC WITH DIFFERENTIAL/PLATELET - Abnormal; Notable for the following components:   HCT 46.6 (*)    All other components within normal limits  SARS CORONAVIRUS 2 (TAT 6-24 HRS)  BRAIN NATRIURETIC PEPTIDE    EKG EKG Interpretation  Date/Time:  Monday August 25 2019 10:13:35 EST Ventricular Rate:  77 PR Interval:    QRS Duration: 85 QT Interval:  384 QTC Calculation: 435 R Axis:   50 Text Interpretation: Atrial fibrillation Ventricular bigeminy Confirmed by Nat Christen [id number]) on 08/25/2019 11:44:11 AM   Radiology Dg Chest Port 1 View  Result Date: 08/25/2019 CLINICAL DATA:  Cough with shortness of breath for 3 days. History of pneumonia, diabetes and hypertension. EXAM: PORTABLE CHEST 1 VIEW COMPARISON:  Radiographs 09/30/2017 and 05/22/2018. CT 03/14/2015. FINDINGS: 1114 hours. Chronic elevation/eventration of the right hemidiaphragm with colonic interposition. Mild chronic bibasilar atelectasis or scarring. There is no confluent airspace opacity, pleural effusion or pneumothorax. The heart size and mediastinal contours are stable with mild aortic atherosclerosis. No acute osseous findings. IMPRESSION: 1. Stable chest with chronic bibasilar atelectasis or scarring. No acute cardiopulmonary process. 2. Chronic right hemidiaphragm elevation/eventration. Electronically Signed   By: Richardean Sale M.D.   On: 08/25/2019 11:28    Procedures Procedures (including critical care time)  Medications Ordered in ED Medications  predniSONE (DELTASONE) tablet 40 mg (has no administration in time range)  albuterol (VENTOLIN HFA) 108 (90 Base) MCG/ACT inhaler 2 puff (has no administration in time range)  albuterol (VENTOLIN HFA) 108 (90 Base) MCG/ACT inhaler 6 puff (6 puffs Inhalation Given 08/25/19 1127)     Initial Impression / Assessment and Plan / ED Course  I have reviewed the triage vital signs and the nursing notes.  Pertinent labs & imaging results that were available during my care of the patient were reviewed by me and considered in my medical decision making (see chart for details).      no hypoxia, chest pain or tachycardia here.EKG read as atrial fib with vent bigeminy, but appears most c/w PVC's.  Discussed this with Dr. Lacinda Axon  Pt also seen by Dr. Lacinda Axon and care plan discussed.   On recheck, lung sounds improving, but continues to have expiratory wheezing.  No hypoxia during stay.  She states that she does not want to stay any longer and she is becoming irritated and requesting d/c home.  She continues to deny chest pain.  I will provide d/c instructions and rx for steroid and albuterol MDI dispensed.  She agrees to return if her sx's worsen or she changes her mind.  Agrees to COVID testing.   After AVS printed, I was informed by nursing staff that pt pulled out her IV and left the dept w/o informing anyone and COVID test that was ordered was not obtained before the patient's departure.     Final  Clinical Impressions(s) / ED Diagnoses   Final diagnoses:  Cough    ED Discharge Orders    None       Kem Parkinson, PA-C 08/26/19 1350    Nat Christen, MD 08/29/19 [id number]

## 2019-08-25 NOTE — ED Notes (Signed)
Patient eloped after removing her own IV. This nurse did not see her walk out but she was bleeding in the hallway per nurse tech. This nurse was in another patients room and waiting for discharge paperwork to [place] and covid swab. Covid swab was unable to be obtained due to patient leaving without notice.

## 2019-08-25 NOTE — Discharge Instructions (Addendum)
1 to 2 puffs of the albuterol inhaler every 4-6 hours as needed.  Take Tylenol every 4 hours if needed for body aches or fever.  Start the prednisone prescription tomorrow.  Follow-up with your primary doctor or return to the ER for any worsening symptoms such as shortness of breath.  Your Covid test is pending.  You will be notified of any positive results or you may review your results on MyChart.  You will need to self isolate at home at least until your results are back

## 2019-08-25 NOTE — ED Notes (Signed)
Patient's daughter Sharyn Lull called and wanted to know if she could come in with patient. Advised daughter that due to patient's symptoms and isolation status, no visitors allowed at this time. Left number to call for update, [phone].

## 2019-09-24 ENCOUNTER — Other Ambulatory Visit: Payer: Self-pay

## 2019-09-24 DIAGNOSIS — Z20822 Contact with and (suspected) exposure to covid-19: Secondary | ICD-10-CM

## 2019-09-27 LAB — NOVEL CORONAVIRUS, NAA: SARS-CoV-2, NAA: NOT DETECTED

## 2019-10-03 DIAGNOSIS — M545 Low back pain: Secondary | ICD-10-CM | POA: Diagnosis not present

## 2019-10-03 DIAGNOSIS — Z79899 Other long term (current) drug therapy: Secondary | ICD-10-CM | POA: Diagnosis not present

## 2019-10-03 DIAGNOSIS — F419 Anxiety disorder, unspecified: Secondary | ICD-10-CM | POA: Diagnosis not present

## 2019-10-03 DIAGNOSIS — G8929 Other chronic pain: Secondary | ICD-10-CM | POA: Diagnosis not present

## 2019-10-03 DIAGNOSIS — F5104 Psychophysiologic insomnia: Secondary | ICD-10-CM | POA: Diagnosis not present

## 2019-10-03 DIAGNOSIS — F329 Major depressive disorder, single episode, unspecified: Secondary | ICD-10-CM | POA: Diagnosis not present

## 2019-11-10 ENCOUNTER — Encounter: Payer: Self-pay | Admitting: Urology

## 2020-01-06 DIAGNOSIS — G8929 Other chronic pain: Secondary | ICD-10-CM | POA: Diagnosis not present

## 2020-01-06 DIAGNOSIS — Z79899 Other long term (current) drug therapy: Secondary | ICD-10-CM | POA: Diagnosis not present

## 2020-01-06 DIAGNOSIS — F419 Anxiety disorder, unspecified: Secondary | ICD-10-CM | POA: Diagnosis not present

## 2020-01-06 DIAGNOSIS — M545 Low back pain: Secondary | ICD-10-CM | POA: Diagnosis not present

## 2020-01-06 DIAGNOSIS — E039 Hypothyroidism, unspecified: Secondary | ICD-10-CM | POA: Diagnosis not present

## 2020-01-06 DIAGNOSIS — F329 Major depressive disorder, single episode, unspecified: Secondary | ICD-10-CM | POA: Diagnosis not present

## 2020-01-06 DIAGNOSIS — N183 Chronic kidney disease, stage 3 unspecified: Secondary | ICD-10-CM | POA: Diagnosis not present

## 2020-01-06 DIAGNOSIS — R7301 Impaired fasting glucose: Secondary | ICD-10-CM | POA: Diagnosis not present

## 2020-01-06 DIAGNOSIS — I10 Essential (primary) hypertension: Secondary | ICD-10-CM | POA: Diagnosis not present

## 2020-01-21 ENCOUNTER — Ambulatory Visit: Payer: PPO | Admitting: Urology

## 2020-03-03 ENCOUNTER — Ambulatory Visit: Payer: PPO | Admitting: Urology

## 2020-03-03 ENCOUNTER — Other Ambulatory Visit (HOSPITAL_COMMUNITY)
Admission: AD | Admit: 2020-03-03 | Discharge: 2020-03-03 | Disposition: A | Discharge status: Home / Self Care | Payer: PPO | Source: Other Acute Inpatient Hospital | Attending: Urology | Admitting: Urology

## 2020-03-03 ENCOUNTER — Ambulatory Visit (HOSPITAL_COMMUNITY)
Admission: RE | Admit: 2020-03-03 | Discharge: 2020-03-03 | Disposition: A | Discharge status: Home / Self Care | Payer: PPO | Source: Ambulatory Visit | Attending: Urology | Admitting: Urology

## 2020-03-03 ENCOUNTER — Encounter: Payer: Self-pay | Admitting: Urology

## 2020-03-03 ENCOUNTER — Other Ambulatory Visit: Payer: Self-pay

## 2020-03-03 ENCOUNTER — Other Ambulatory Visit: Payer: Self-pay | Admitting: Urology

## 2020-03-03 VITALS — BP 133/78 | HR 81 | Temp 97.5°F | Ht 64.4 in | Wt 202.0 lb

## 2020-03-03 DIAGNOSIS — N3000 Acute cystitis without hematuria: Secondary | ICD-10-CM | POA: Insufficient documentation

## 2020-03-03 DIAGNOSIS — N200 Calculus of kidney: Secondary | ICD-10-CM

## 2020-03-03 DIAGNOSIS — Z87442 Personal history of urinary calculi: Secondary | ICD-10-CM | POA: Diagnosis not present

## 2020-03-03 LAB — POCT URINALYSIS DIPSTICK
Bilirubin, UA: NEGATIVE
Blood, UA: NEGATIVE
Glucose, UA: NEGATIVE
Ketones, UA: NEGATIVE
Nitrite, UA: NEGATIVE
Protein, UA: NEGATIVE
Spec Grav, UA: 1.015 (ref 1.010–1.025)
Urobilinogen, UA: 0.2 E.U./dL
pH, UA: 5 (ref 5.0–8.0)

## 2020-03-03 MED ORDER — CEFUROXIME AXETIL 250 MG PO TABS: 250.0000 mg | ORAL_TABLET | Freq: Two times a day (BID) | ORAL | 0 refills | Status: DC

## 2020-03-03 NOTE — Patient Instructions (Signed)

## 2020-03-03 NOTE — Progress Notes (Signed)
Urological Symptom Review  Patient is experiencing the following symptoms: none   Review of Systems  Gastrointestinal (upper)  : Negative for upper GI symptoms  Gastrointestinal (lower) : Constipation  Constitutional : Negative for symptoms  Skin: Negative for skin symptoms  Eyes: Negative for eye symptoms  Ear/Nose/Throat : Sinus problems  Hematologic/Lymphatic: Easy bruising  Cardiovascular : Negative for cardiovascular symptoms  Respiratory : Negative for respiratory symptoms  Endocrine: Negative for endocrine symptoms  Musculoskeletal: Back pain Joint pain  Neurological: Headaches  Psychologic: Depression Anxiety

## 2020-03-03 NOTE — Progress Notes (Signed)
03/03/2020 10:27 AM   Courtney Orozco Aug 15, 1951 [id number]  Referring provider: Galen Manila, [place] [address]  nephrolithiasis  HPI: Ms Bickett is a 69yo here for followup for nephrolithiasis. No stone events since last visit. No flank pain. She has not been diagnosed with a UTI since last visit. She has had dysuria for the past 2 weeks. UA shows leukocytes. No hematuria. She is on macrobid for prophylaxis  Here records from [facility] are as follows: 11/21/2017: She underwent Right ureteral stent placement by Dr. Diona Fanti on 12/8 for an infected 44mm right distal ureteral calculus.   12/19/2017: s/p R URS. stone composition CaOx. She drinks over 64oz of water daily. She puts lemon in her water. Urine pH has always been acidic   01/23/2018: No stone events since last visit. Renal US shows no right hydronephrosis. Patient is taking sodium bicarb 650mg  BID   07/31/2018: NO stone events since last visit. She has stopped sodium bicarb due to lower extremity edema. She has mild left flank pain. KUB from today shows a possible 12mm left UPJ calculus. Ct showed no renal or ureteral calculi   11/06/2018: Flank pain resolved after cholecystectomy. no stone events since last visit   06/10/2019: No stone events since last. KUB shows no calculi     PMH: Past Medical History:  Diagnosis Date  . Anxiety   . Arthritis   . Back pain   . Depression   . Diabetes mellitus without complication ([place])    no meds in 5 years   . Gait instability   . GERD (gastroesophageal reflux disease)   . Headache    hx of migraines   . Hepatitis    hx of hep B - 44 years ago   . History of kidney stones   . Hypertension   . Hypothyroidism   . Memory loss   . Pneumonia    hx of 01/2015   . PONV (postoperative nausea and vomiting)    pt states "the last two times ive been put to sleep, my oxygen drops low and they have trouble waking me up.  . Thyroid disease   . TIA (transient  ischemic attack)     Surgical History: Past Surgical History:  Procedure Laterality Date  . ABDOMINAL HYSTERECTOMY    . APPENDECTOMY    . BACK SURGERY     x 4  . bladder tack surgery     . BREAST SURGERY Right    removal of 3 benign tumors  . CATARACT EXTRACTION W/PHACO Left 04/30/2017   Procedure: CATARACT EXTRACTION PHACO AND INTRAOCULAR LENS PLACEMENT (IOC);  Surgeon: Tonny Branch, MD;  Location: [facility];  Service: Ophthalmology;  Laterality: Left;  CDE: 7.16  . CATARACT EXTRACTION W/PHACO Right 05/14/2017   Procedure: CATARACT EXTRACTION PHACO AND INTRAOCULAR LENS PLACEMENT RIGHT EYE;  Surgeon: Tonny Branch, MD;  Location: [facility];  Service: Ophthalmology;  Laterality: Right;  CDE: 6.63  . CHOLECYSTECTOMY N/A 08/21/2018   Procedure: LAPAROSCOPIC CHOLECYSTECTOMY;  Surgeon: Virl Cagey, MD;  Location: [facility];  Service: General;  Laterality: N/A;  . CYSTOSCOPY WITH STENT PLACEMENT Right 09/28/2017   Procedure: CYSTOSCOPY WITH STENT PLACEMENT;  Surgeon: Franchot Gallo, MD;  Location: [facility];  Service: Urology;  Laterality: Right;  . CYSTOSCOPY WITH URETEROSCOPY, STONE BASKETRY AND STENT PLACEMENT Right 04/06/2015   Procedure: CYSTOSCOPY WITH RIGHT URETEROSCOPY, STONE BASKETRY AND STENT PLACEMENT;  Surgeon: Irine Seal, MD;  Location: [facility];  Service: Urology;  Laterality: Right;  . CYSTOSCOPY/RETROGRADE/URETEROSCOPY Right 11/28/2017   Procedure: CYSTOSCOPY,  REMOVAL OF RIGHT URETERAL STENT, RIGHT RETROGRADE PYELOGRAM, RIGHT URETEROSCOPY, STONE BASKET EXTRACTION RIGHT URETERAL CALCULUS;  Surgeon: Cleon Gustin, MD;  Location: [facility];  Service: Urology;  Laterality: Right;  . HOLMIUM LASER APPLICATION Right [id number]   Procedure: HOLMIUM LASER APPLICATION;  Surgeon: Irine Seal, MD;  Location: [facility];  Service: Urology;  Laterality: Right;  . KNEE SURGERY     left  . ROTATOR CUFF REPAIR Left   . thumb surgery     left    Home Medications:  Allergies as of 03/03/2020       Reactions   Ciprofloxacin Hives, Other (See Comments)   Can take levaquin with no problems per patient and family   Macrobid [nitrofurantoin] Nausea And Vomiting   Azithromycin Itching, Swelling   Sulfa Antibiotics Itching      Medication List       Accurate as of Mar 03, 2020 10:27 AM. If you have any questions, ask your nurse or doctor.        albuterol 108 (90 Base) MCG/ACT inhaler Commonly known as: VENTOLIN HFA INHALE 2 PUFFS EVERY 4 HOURS AS NEEDED What changed: Another medication with the same name was removed. Continue taking this medication, and follow the directions you see here. Changed by: Nicolette Bang, MD   aspirin 325 MG tablet Take 325 mg by mouth daily.   aspirin-acetaminophen-caffeine 250-250-65 MG tablet Commonly known as: EXCEDRIN MIGRAINE Take 2 tablets by mouth daily as needed (pain).   benzonatate 200 MG capsule Commonly known as: TESSALON Take 1 capsule (200 mg total) by mouth 3 (three) times daily as needed for cough. Swallow whole, do not chew   bisacodyl 5 MG EC tablet Commonly known as: DULCOLAX Take 5 mg by mouth daily as needed for moderate constipation.   buPROPion HCl ER (XL) 450 MG Tb24 Take 1 tablet by mouth every morning. What changed: Another medication with the same name was removed. Continue taking this medication, and follow the directions you see here. Changed by: Nicolette Bang, MD   diazepam 5 MG tablet Commonly known as: VALIUM Take by mouth. What changed: Another medication with the same name was removed. Continue taking this medication, and follow the directions you see here. Changed by: Nicolette Bang, MD   diphenhydrAMINE 25 MG tablet Commonly known as: BENADRYL Take 25 mg by mouth daily.   ergocalciferol 1.25 MG (50000 UT) capsule Commonly known as: VITAMIN D2 Take by mouth.   Fluticasone-Salmeterol 250-50 MCG/DOSE Aepb Commonly known as: ADVAIR USE 1 INHALATION TWICE DAILY What changed: Another  medication with the same name was removed. Continue taking this medication, and follow the directions you see here. Changed by: Nicolette Bang, MD   gabapentin 100 MG capsule Commonly known as: NEURONTIN Take by mouth.   HYDROcodone-acetaminophen 5-325 MG tablet Commonly known as: NORCO/VICODIN Take by mouth. What changed: Another medication with the same name was removed. Continue taking this medication, and follow the directions you see here. Changed by: Nicolette Bang, MD   levothyroxine 100 MCG tablet Commonly known as: SYNTHROID Take 175 mcg by mouth daily. Take with 114mcg tablet What changed: Another medication with the same name was removed. Continue taking this medication, and follow the directions you see here. Changed by: Nicolette Bang, MD   Melatonin 10 MG Tabs Take 30 mg by mouth at bedtime.   metoprolol tartrate 50 MG tablet Commonly known as: LOPRESSOR Take 50 mg by mouth  2 (two) times daily.   nitrofurantoin 50 MG capsule Commonly known as: MACRODANTIN Take 50 mg by mouth daily.   nortriptyline 50 MG capsule Commonly known as: PAMELOR Take 50 mg by mouth at bedtime.   potassium chloride SA 20 MEQ tablet Commonly known as: KLOR-CON TAKE TWO (2) TABLETS THREE (3) TIMES DAILY What changed: Another medication with the same name was removed. Continue taking this medication, and follow the directions you see here. Changed by: Nicolette Bang, MD   predniSONE 20 MG tablet Commonly known as: DELTASONE Take 2 tablets (40 mg total) by mouth daily. What changed: Another medication with the same name was removed. Continue taking this medication, and follow the directions you see here. Changed by: Nicolette Bang, MD   torsemide 10 MG tablet Commonly known as: DEMADEX Take 10 mg by mouth daily. What changed: Another medication with the same name was removed. Continue taking this medication, and follow the directions you see here. Changed by: Nicolette Bang, MD   vitamin C 100 MG tablet Take by mouth.   Vitamins/Minerals Tabs Take by mouth.   zinc gluconate 50 MG tablet Take by mouth.   zolpidem 10 MG tablet Commonly known as: AMBIEN Take 10 mg by mouth at bedtime.       Allergies:  Allergies  Allergen Reactions  . Ciprofloxacin Hives and Other (See Comments)    Can take levaquin with no problems per patient and family  . Macrobid [Nitrofurantoin] Nausea And Vomiting  . Azithromycin Itching and Swelling  . Sulfa Antibiotics Itching    Family History: Family History  Problem Relation Age of Onset  . COPD Father     Social History:  reports that she has been smoking cigarettes. She has a 25.00 pack-year smoking history. She has never used smokeless tobacco. She reports that she does not drink alcohol or use drugs.  ROS: All other review of systems were reviewed and are negative except what is noted above in HPI  Physical Exam: BP 133/78   Pulse 81   Temp (!) 97.5 F (36.4 C)   Ht 5' 4.4" (1.636 m)   Wt 202 lb (91.6 kg)   BMI 34.24 kg/m   Constitutional:  Alert and oriented, No acute distress. HEENT: [place] AT, moist mucus membranes.  Trachea midline, no masses. Cardiovascular: No clubbing, cyanosis, or edema. Respiratory: Normal respiratory effort, no increased work of breathing. GI: Abdomen is soft, nontender, nondistended, no abdominal masses GU: No CVA tenderness.  Lymph: No cervical or inguinal lymphadenopathy. Skin: No rashes, bruises or suspicious lesions. Neurologic: Grossly intact, no focal deficits, moving all 4 extremities. Psychiatric: Normal mood and affect.  Laboratory Data: Lab Results  Component Value Date   WBC 8.8 08/25/2019   HGB 14.9 08/25/2019   HCT 46.6 (H) 08/25/2019   MCV 96.3 08/25/2019   PLT 275 08/25/2019    Lab Results  Component Value Date   CREATININE 1.02 (H) 08/25/2019    No results found for: PSA  No results found for: TESTOSTERONE  Lab Results  Component  Value Date   HGBA1C 5.9 (H) 08/16/2018    Urinalysis    Component Value Date/Time   COLORURINE YELLOW 08/16/2018 1101   APPEARANCEUR CLOUDY (A) 08/16/2018 1101   LABSPEC 1.008 08/16/2018 1101   PHURINE 6.0 08/16/2018 1101   GLUCOSEU NEGATIVE 08/16/2018 1101   [place] 08/16/2018 [place] 08/16/2018 1101   [place] 08/16/2018 [place] 08/16/2018 1101   NITRITE  POSITIVE (A) 08/16/2018 1101   LEUKOCYTESUR SMALL (A) 08/16/2018 1101    Lab Results  Component Value Date   BACTERIA RARE (A) 08/16/2018    Pertinent Imaging:  Results for orders placed during the hospital encounter of 03/12/19  DG Abd 1 View   Narrative CLINICAL DATA:  Hematuria.  Evaluate for nephrolithiasis.  EXAM: ABDOMEN - 1 VIEW  COMPARISON:  CT abdomen pelvis-07/31/2017  FINDINGS: No definitive abnormal opacities overlie the expected location of either renal fossa, ureter or the urinary bladder. Post cholecystectomy.  There is mild patulous distension of the transverse colon. There is mild elevation of the right hemidiaphragm with associated interposition of the hepatic flexure of the colon anterior to the liver as was demonstrated on prior CT. No evidence of enteric obstruction.  Degenerative change of the lower lumbar spine is suspected though incompletely evaluated.  IMPRESSION: No definite evidence of nephrolithiasis.   Electronically Signed   By: Sandi Mariscal M.D.   On: 03/12/2019 14:43    No results found for this or any previous visit. No results found for this or any previous visit. No results found for this or any previous visit. Results for orders placed during the hospital encounter of 01/21/18  US RENAL   Narrative CLINICAL DATA:  Ureteral calculus.  EXAM: RENAL / URINARY TRACT ULTRASOUND COMPLETE  COMPARISON:  CT of the abdomen and pelvis 09/28/2017.  FINDINGS: Right Kidney:  Length: 10.2 cm. Echogenicity within  normal limits. No mass or hydronephrosis visualized.  Left Kidney:  Length: 12.0 cm. Echogenicity within normal limits. A focal echogenicity at the lower pole of the left kidney may represent a 4 mm nonobstructing stone. This was not present on the recent CT scan.  Bladder:  Appears normal for degree of bladder distention.  IMPRESSION: 1. Previously noted right-sided hydronephrosis has resolved. 2. Possible 4 mm stone at the lower pole left kidney. 3. No obstructive uropathy.   Electronically Signed   By: San Morelle M.D.   On: 01/21/2018 11:58    No results found for this or any previous visit. No results found for this or any previous visit. No results found for this or any previous visit.  Assessment & Plan:    1. Nephrolithiasis -KUB today if it shows no calculi she will followup in 6 months with KUB  2. Acute cystitis -urine for culture, will all with results -ceftin 250mg  BID for 7 days -patient to restart macrobid qhs after finishing ceftin   No follow-ups on file.  Nicolette Bang, MD  [facility] [place]

## 2020-03-05 ENCOUNTER — Telehealth: Payer: Self-pay | Admitting: Urology

## 2020-03-05 DIAGNOSIS — L570 Actinic keratosis: Secondary | ICD-10-CM | POA: Diagnosis not present

## 2020-03-05 DIAGNOSIS — L820 Inflamed seborrheic keratosis: Secondary | ICD-10-CM | POA: Diagnosis not present

## 2020-03-05 DIAGNOSIS — D485 Neoplasm of uncertain behavior of skin: Secondary | ICD-10-CM | POA: Diagnosis not present

## 2020-03-05 NOTE — Telephone Encounter (Signed)
Left message of normal kub per discussing with Dr. Alyson Ingles

## 2020-03-05 NOTE — Telephone Encounter (Signed)
Patient called for xray results.

## 2020-03-06 LAB — URINE CULTURE: Culture: 10000 — AB

## 2020-04-07 DIAGNOSIS — M545 Low back pain: Secondary | ICD-10-CM | POA: Diagnosis not present

## 2020-04-07 DIAGNOSIS — L989 Disorder of the skin and subcutaneous tissue, unspecified: Secondary | ICD-10-CM | POA: Diagnosis not present

## 2020-04-07 DIAGNOSIS — F419 Anxiety disorder, unspecified: Secondary | ICD-10-CM | POA: Diagnosis not present

## 2020-04-07 DIAGNOSIS — G8929 Other chronic pain: Secondary | ICD-10-CM | POA: Diagnosis not present

## 2020-04-07 DIAGNOSIS — I10 Essential (primary) hypertension: Secondary | ICD-10-CM | POA: Diagnosis not present

## 2020-04-07 DIAGNOSIS — U071 COVID-19: Secondary | ICD-10-CM | POA: Diagnosis not present

## 2020-04-07 DIAGNOSIS — F329 Major depressive disorder, single episode, unspecified: Secondary | ICD-10-CM | POA: Diagnosis not present

## 2020-04-07 DIAGNOSIS — Z79899 Other long term (current) drug therapy: Secondary | ICD-10-CM | POA: Diagnosis not present

## 2020-05-05 DIAGNOSIS — Z79899 Other long term (current) drug therapy: Secondary | ICD-10-CM | POA: Diagnosis not present

## 2020-05-05 DIAGNOSIS — R0989 Other specified symptoms and signs involving the circulatory and respiratory systems: Secondary | ICD-10-CM | POA: Diagnosis not present

## 2020-05-26 DIAGNOSIS — Z7189 Other specified counseling: Secondary | ICD-10-CM | POA: Diagnosis not present

## 2020-05-26 DIAGNOSIS — Z79899 Other long term (current) drug therapy: Secondary | ICD-10-CM | POA: Diagnosis not present

## 2020-05-26 DIAGNOSIS — R0989 Other specified symptoms and signs involving the circulatory and respiratory systems: Secondary | ICD-10-CM | POA: Diagnosis not present

## 2020-06-30 DIAGNOSIS — N183 Chronic kidney disease, stage 3 unspecified: Secondary | ICD-10-CM | POA: Diagnosis not present

## 2020-06-30 DIAGNOSIS — E039 Hypothyroidism, unspecified: Secondary | ICD-10-CM | POA: Diagnosis not present

## 2020-06-30 DIAGNOSIS — R7301 Impaired fasting glucose: Secondary | ICD-10-CM | POA: Diagnosis not present

## 2020-06-30 DIAGNOSIS — G8929 Other chronic pain: Secondary | ICD-10-CM | POA: Diagnosis not present

## 2020-06-30 DIAGNOSIS — F419 Anxiety disorder, unspecified: Secondary | ICD-10-CM | POA: Diagnosis not present

## 2020-06-30 DIAGNOSIS — R0989 Other specified symptoms and signs involving the circulatory and respiratory systems: Secondary | ICD-10-CM | POA: Diagnosis not present

## 2020-06-30 DIAGNOSIS — Z79899 Other long term (current) drug therapy: Secondary | ICD-10-CM | POA: Diagnosis not present

## 2020-06-30 DIAGNOSIS — R609 Edema, unspecified: Secondary | ICD-10-CM | POA: Diagnosis not present

## 2020-06-30 DIAGNOSIS — F329 Major depressive disorder, single episode, unspecified: Secondary | ICD-10-CM | POA: Diagnosis not present

## 2020-06-30 DIAGNOSIS — M545 Low back pain: Secondary | ICD-10-CM | POA: Diagnosis not present

## 2020-06-30 DIAGNOSIS — Z23 Encounter for immunization: Secondary | ICD-10-CM | POA: Diagnosis not present

## 2020-08-22 ENCOUNTER — Other Ambulatory Visit: Payer: Self-pay | Admitting: Urology

## 2020-09-08 ENCOUNTER — Other Ambulatory Visit: Payer: Self-pay

## 2020-09-08 ENCOUNTER — Ambulatory Visit (INDEPENDENT_AMBULATORY_CARE_PROVIDER_SITE_OTHER): Payer: PPO | Admitting: Urology

## 2020-09-08 ENCOUNTER — Encounter: Payer: Self-pay | Admitting: Urology

## 2020-09-08 ENCOUNTER — Ambulatory Visit (HOSPITAL_COMMUNITY)
Admission: RE | Admit: 2020-09-08 | Discharge: 2020-09-08 | Disposition: A | Discharge status: Home / Self Care | Payer: PPO | Source: Ambulatory Visit | Attending: Urology | Admitting: Urology

## 2020-09-08 VITALS — BP 96/54 | HR 64 | Temp 97.8°F | Ht 64.5 in | Wt 182.0 lb

## 2020-09-08 DIAGNOSIS — N200 Calculus of kidney: Secondary | ICD-10-CM | POA: Insufficient documentation

## 2020-09-08 DIAGNOSIS — Z9049 Acquired absence of other specified parts of digestive tract: Secondary | ICD-10-CM | POA: Diagnosis not present

## 2020-09-08 DIAGNOSIS — Z87442 Personal history of urinary calculi: Secondary | ICD-10-CM | POA: Diagnosis not present

## 2020-09-08 DIAGNOSIS — N3021 Other chronic cystitis with hematuria: Secondary | ICD-10-CM | POA: Diagnosis not present

## 2020-09-08 LAB — URINALYSIS, ROUTINE W REFLEX MICROSCOPIC
Bilirubin, UA: NEGATIVE
Glucose, UA: NEGATIVE
Ketones, UA: NEGATIVE
Nitrite, UA: NEGATIVE
Protein,UA: NEGATIVE
RBC, UA: NEGATIVE
Specific Gravity, UA: 1.01 (ref 1.005–1.030)
Urobilinogen, Ur: 0.2 mg/dL (ref 0.2–1.0)
pH, UA: 5.5 (ref 5.0–7.5)

## 2020-09-08 LAB — MICROSCOPIC EXAMINATION
Epithelial Cells (non renal): >10 /hpf — AB (ref 0–10)
RBC, Urine: NONE SEEN /hpf (ref 0–2)
Renal Epithel, UA: NONE SEEN /hpf

## 2020-09-08 MED ORDER — NITROFURANTOIN MACROCRYSTAL 50 MG PO CAPS
50.0000 mg | ORAL_CAPSULE | Freq: Every day | ORAL | 3 refills | Status: DC
Start: 2020-09-08 — End: 2021-09-05

## 2020-09-08 MED ORDER — LACTULOSE 10 GM/15ML PO SOLN: 10.0000 g | Freq: Two times a day (BID) | ORAL | 0 refills | Status: DC | PRN

## 2020-09-08 NOTE — Progress Notes (Signed)
Urological Symptom Review  Patient is experiencing the following symptoms: Burning/pain with urination Urinary tract infection   Review of Systems  Gastrointestinal (upper)  : Nausea  Gastrointestinal (lower) : Constipation  Constitutional : Weight loss  Skin: Negative for skin symptoms  Eyes: Negative for eye symptoms  Ear/Nose/Throat : Sinus problems  Hematologic/Lymphatic: Negative for Hematologic/Lymphatic symptoms  Cardiovascular : Negative for cardiovascular symptoms  Respiratory : Negative for respiratory symptoms  Endocrine: Negative for endocrine symptoms  Musculoskeletal: Back pain Joint pain  Neurological: Dizziness  Psychologic: Depression

## 2020-09-08 NOTE — Patient Instructions (Signed)

## 2020-09-08 NOTE — Progress Notes (Signed)
09/08/2020 9:58 AM   Courtney Orozco 05/23/1951 [id number]  Referring provider: Galen Orozco, [place] [address]  followup nephrolithiasis and recurrent UTI  HPI: Courtney Orozco is a 69yo here for followup recurrent UTI and nephrolithiasis. No UTis since last visit. She is on macrobid 50mg  qhs, No stone events since last visit. KUB shows no calculi   PMH: Past Medical History:  Diagnosis Date  . Anxiety   . Arthritis   . Back pain   . Depression   . Diabetes mellitus without complication ([place])    no meds in 5 years   . Gait instability   . GERD (gastroesophageal reflux disease)   . Headache    hx of migraines   . Hepatitis    hx of hep B - 44 years ago   . History of kidney stones   . Hypertension   . Hypothyroidism   . Memory loss   . Pneumonia    hx of 01/2015   . PONV (postoperative nausea and vomiting)    pt states "the last two times ive been put to sleep, my oxygen drops low and they have trouble waking me up.  . Thyroid disease   . TIA (transient ischemic attack)     Surgical History: Past Surgical History:  Procedure Laterality Date  . ABDOMINAL HYSTERECTOMY    . APPENDECTOMY    . BACK SURGERY     x 4  . bladder tack surgery     . BREAST SURGERY Right    removal of 3 benign tumors  . CATARACT EXTRACTION W/PHACO Left 04/30/2017   Procedure: CATARACT EXTRACTION PHACO AND INTRAOCULAR LENS PLACEMENT ([facility]);  Surgeon: Tonny Branch, MD;  Location: [facility];  Service: Ophthalmology;  Laterality: Left;  CDE: 7.16  . CATARACT EXTRACTION W/PHACO Right 05/14/2017   Procedure: CATARACT EXTRACTION PHACO AND INTRAOCULAR LENS PLACEMENT RIGHT EYE;  Surgeon: Tonny Branch, MD;  Location: [facility];  Service: Ophthalmology;  Laterality: Right;  CDE: 6.63  . CHOLECYSTECTOMY N/A 08/21/2018   Procedure: LAPAROSCOPIC CHOLECYSTECTOMY;  Surgeon: Virl Cagey, MD;  Location: [facility];  Service: General;  Laterality: N/A;  . CYSTOSCOPY WITH STENT PLACEMENT  Right 09/28/2017   Procedure: CYSTOSCOPY WITH STENT PLACEMENT;  Surgeon: Franchot Gallo, MD;  Location: [facility];  Service: Urology;  Laterality: Right;  . CYSTOSCOPY WITH URETEROSCOPY, STONE BASKETRY AND STENT PLACEMENT Right 04/06/2015   Procedure: CYSTOSCOPY WITH RIGHT URETEROSCOPY, STONE BASKETRY AND STENT PLACEMENT;  Surgeon: Irine Seal, MD;  [facility];  Service: Urology;  Laterality: Right;  . CYSTOSCOPY/RETROGRADE/URETEROSCOPY Right 11/28/2017   Procedure: CYSTOSCOPY,  REMOVAL OF RIGHT URETERAL STENT, RIGHT RETROGRADE PYELOGRAM, RIGHT URETEROSCOPY, STONE BASKET EXTRACTION RIGHT URETERAL CALCULUS;  Surgeon: Cleon Gustin, MD;  Location: [facility];  Service: Urology;  Laterality: Right;  . HOLMIUM LASER APPLICATION Right 1/54/0086   Procedure: HOLMIUM LASER APPLICATION;  Surgeon: Irine Seal, MD;  [facility];  Service: Urology;  Laterality: Right;  . KNEE SURGERY     left  . ROTATOR CUFF REPAIR Left   . thumb surgery     left    Home Medications:  Allergies as of 09/08/2020      Reactions   Ciprofloxacin Hives, Other (See Comments)   Can take levaquin with no problems per patient and family   Macrobid [nitrofurantoin] Nausea And Vomiting   Azithromycin Itching, Swelling   Sulfa Antibiotics Itching      Medication List  Accurate as of September 08, 2020  9:58 AM. If you have any questions, ask your nurse or doctor.        albuterol 108 (90 Base) MCG/ACT inhaler Commonly known as: VENTOLIN HFA INHALE 2 PUFFS EVERY 4 HOURS AS NEEDED   aspirin 325 MG tablet Take 325 mg by mouth daily.   aspirin-acetaminophen-caffeine 250-250-65 MG tablet Commonly known as: EXCEDRIN MIGRAINE Take 2 tablets by mouth daily as needed (pain).   benzonatate 200 MG capsule Commonly known as: TESSALON Take 1 capsule (200 mg total) by mouth 3 (three) times daily as needed for cough. Swallow whole, do not chew   bisacodyl 5 MG EC tablet Commonly known as: DULCOLAX Take 5 mg by  mouth daily as needed for moderate constipation.   buPROPion HCl ER (XL) 450 MG Tb24 Take 1 tablet by mouth every morning.   buPROPion 150 MG 12 hr tablet Commonly known as: WELLBUTRIN SR   cefUROXime 250 MG tablet Commonly known as: CEFTIN Take 1 tablet (250 mg total) by mouth 2 (two) times daily with a meal.   diazepam 5 MG tablet Commonly known as: VALIUM Take by mouth.   diphenhydrAMINE 25 MG tablet Commonly known as: BENADRYL Take 25 mg by mouth daily.   ergocalciferol 1.25 MG (50000 UT) capsule Commonly known as: VITAMIN D2 Take by mouth.   Fluticasone-Salmeterol 250-50 MCG/DOSE Aepb Commonly known as: ADVAIR USE 1 INHALATION TWICE DAILY   gabapentin 100 MG capsule Commonly known as: NEURONTIN Take by mouth.   HYDROcodone-acetaminophen 5-325 MG tablet Commonly known as: NORCO/VICODIN Take by mouth.   levothyroxine 175 MCG tablet Commonly known as: SYNTHROID   levothyroxine 100 MCG tablet Commonly known as: SYNTHROID Take 175 mcg by mouth daily. Take with 128mcg tablet   Melatonin 10 MG Tabs Take 30 mg by mouth at bedtime.   metoprolol tartrate 25 MG tablet Commonly known as: LOPRESSOR Take 25 mg by mouth 2 (two) times daily. What changed: Another medication with the same name was removed. Continue taking this medication, and follow the directions you see here. Changed by: Nicolette Bang, MD   nitrofurantoin 50 MG capsule Commonly known as: MACRODANTIN TAKE ONE CAPSULE BY MOUTH AT BEDTIME   nortriptyline 50 MG capsule Commonly known as: PAMELOR Take 50 mg by mouth at bedtime.   potassium chloride SA 20 MEQ tablet Commonly known as: KLOR-CON TAKE TWO (2) TABLETS THREE (3) TIMES DAILY   predniSONE 20 MG tablet Commonly known as: DELTASONE Take 2 tablets (40 mg total) by mouth daily.   torsemide 10 MG tablet Commonly known as: DEMADEX Take 10 mg by mouth daily.   vitamin C 100 MG tablet Take by mouth.   Vitamins/Minerals Tabs Take by  mouth.   zinc gluconate 50 MG tablet Take by mouth.   zolpidem 10 MG tablet Commonly known as: AMBIEN Take 10 mg by mouth at bedtime.       Allergies:  Allergies  Allergen Reactions  . Ciprofloxacin Hives and Other (See Comments)    Can take levaquin with no problems per patient and family  . Macrobid [Nitrofurantoin] Nausea And Vomiting  . Azithromycin Itching and Swelling  . Sulfa Antibiotics Itching    Family History: Family History  Problem Relation Age of Onset  . COPD Father     Social History:  reports that she has been smoking cigarettes. She has a 25.00 pack-year smoking history. She has never used smokeless tobacco. She reports that she does not drink alcohol and does not  use drugs.  ROS: All other review of systems were reviewed and are negative except what is noted above in HPI  Physical Exam: BP (!) 96/54   Pulse 64   Temp 97.8 F (36.6 C)   Ht 5' 4.5" (1.638 m)   Wt 182 lb (82.6 kg)   BMI 30.76 kg/m   Constitutional:  Alert and oriented, No acute distress. HEENT: [place] AT, moist mucus membranes.  Trachea midline, no masses. Cardiovascular: No clubbing, cyanosis, or edema. Respiratory: Normal respiratory effort, no increased work of breathing. GI: Abdomen is soft, nontender, nondistended, no abdominal masses GU: No CVA tenderness.  Lymph: No cervical or inguinal lymphadenopathy. Skin: No rashes, bruises or suspicious lesions. Neurologic: Grossly intact, no focal deficits, moving all 4 extremities. Psychiatric: Normal mood and affect.  Laboratory Data: Lab Results  Component Value Date   WBC 8.8 08/25/2019   HGB 14.9 08/25/2019   HCT 46.6 (H) 08/25/2019   MCV 96.3 08/25/2019   PLT 275 08/25/2019    Lab Results  Component Value Date   CREATININE 1.02 (H) 08/25/2019    No results found for: PSA  No results found for: TESTOSTERONE  Lab Results  Component Value Date   HGBA1C 5.9 (H) 08/16/2018    Urinalysis    Component Value  Date/Time   COLORURINE YELLOW 08/16/2018 1101   APPEARANCEUR CLOUDY (A) 08/16/2018 1101   LABSPEC 1.008 08/16/2018 1101   PHURINE 6.0 08/16/2018 1101   GLUCOSEU NEGATIVE 08/16/2018 1101   HGBUR NEGATIVE 08/16/2018 1101   BILIRUBINUR neg 03/03/2020 1043   KETONESUR NEGATIVE 08/16/2018 1101   PROTEINUR Negative 03/03/2020 1043   PROTEINUR NEGATIVE 08/16/2018 1101   UROBILINOGEN 0.2 03/03/2020 1043   NITRITE neg 03/03/2020 1043   NITRITE POSITIVE (A) 08/16/2018 1101   LEUKOCYTESUR Small (1+) (A) 03/03/2020 1043    Lab Results  Component Value Date   BACTERIA RARE (A) 08/16/2018    Pertinent Imaging: KUb today: Images reviewed and discussed witht he patient Results for orders placed during the hospital encounter of 03/03/20  Abdomen 1 view (KUB)  Narrative CLINICAL DATA:  Nephrolithiasis. History of kidney stones. Follow-up exam.  EXAM: ABDOMEN - 1 VIEW  COMPARISON:  Radiographs, 03/12/2019.  CT, 07/31/2018.  FINDINGS: No visualized renal or ureteral stones. Status post cholecystectomy. Soft tissues are otherwise unremarkable. Normal bowel gas pattern.  Stable degenerative changes noted along the visualized spine.  IMPRESSION: 1. No evidence of renal or ureteral stones.  No acute findings.   Electronically Signed By: Lajean Manes M.D. On: 03/03/2020 16:43  No results found for this or any previous visit.  No results found for this or any previous visit.  No results found for this or any previous visit.  Results for orders placed during the hospital encounter of 01/21/18  US RENAL  Narrative CLINICAL DATA:  Ureteral calculus.  EXAM: RENAL / URINARY TRACT ULTRASOUND COMPLETE  COMPARISON:  CT of the abdomen and pelvis 09/28/2017.  FINDINGS: Right Kidney:  Length: 10.2 cm. Echogenicity within normal limits. No mass or hydronephrosis visualized.  Left Kidney:  Length: 12.0 cm. Echogenicity within normal limits. A focal echogenicity at the lower  pole of the left kidney may represent a 4 mm nonobstructing stone. This was not present on the recent CT scan.  Bladder:  Appears normal for degree of bladder distention.  IMPRESSION: 1. Previously noted right-sided hydronephrosis has resolved. 2. Possible 4 mm stone at the lower pole left kidney. 3. No obstructive uropathy.   Electronically Signed By:  San Morelle M.D. On: 01/21/2018 11:58  No results found for this or any previous visit.  No results found for this or any previous visit.  No results found for this or any previous visit.   Assessment & Plan:    1. Nephrolithiasis -RTC  1 year with KUB - Urinalysis, Routine w reflex microscopic  2. Hx of recurrent UTI -continue macrobid 50mg  qhs   No follow-ups on file.  Nicolette Bang, MD  [facility] [place]

## 2020-09-15 NOTE — Progress Notes (Signed)
Letter mailed

## 2020-10-15 DIAGNOSIS — H2513 Age-related nuclear cataract, bilateral: Secondary | ICD-10-CM | POA: Diagnosis not present

## 2020-10-15 DIAGNOSIS — H40033 Anatomical narrow angle, bilateral: Secondary | ICD-10-CM | POA: Diagnosis not present

## 2020-10-18 DIAGNOSIS — M545 Low back pain, unspecified: Secondary | ICD-10-CM | POA: Diagnosis not present

## 2020-10-18 DIAGNOSIS — Z79899 Other long term (current) drug therapy: Secondary | ICD-10-CM | POA: Diagnosis not present

## 2020-10-18 DIAGNOSIS — R0989 Other specified symptoms and signs involving the circulatory and respiratory systems: Secondary | ICD-10-CM | POA: Diagnosis not present

## 2020-10-18 DIAGNOSIS — F32A Depression, unspecified: Secondary | ICD-10-CM | POA: Diagnosis not present

## 2020-10-18 DIAGNOSIS — F419 Anxiety disorder, unspecified: Secondary | ICD-10-CM | POA: Diagnosis not present

## 2020-10-18 DIAGNOSIS — G8929 Other chronic pain: Secondary | ICD-10-CM | POA: Diagnosis not present

## 2021-01-04 DIAGNOSIS — L97819 Non-pressure chronic ulcer of other part of right lower leg with unspecified severity: Secondary | ICD-10-CM | POA: Diagnosis not present

## 2021-01-04 DIAGNOSIS — L97829 Non-pressure chronic ulcer of other part of left lower leg with unspecified severity: Secondary | ICD-10-CM | POA: Diagnosis not present

## 2021-01-04 DIAGNOSIS — I872 Venous insufficiency (chronic) (peripheral): Secondary | ICD-10-CM | POA: Diagnosis not present

## 2021-01-17 DIAGNOSIS — Z79899 Other long term (current) drug therapy: Secondary | ICD-10-CM | POA: Diagnosis not present

## 2021-01-17 DIAGNOSIS — N183 Chronic kidney disease, stage 3 unspecified: Secondary | ICD-10-CM | POA: Diagnosis not present

## 2021-01-17 DIAGNOSIS — F419 Anxiety disorder, unspecified: Secondary | ICD-10-CM | POA: Diagnosis not present

## 2021-01-17 DIAGNOSIS — M545 Low back pain, unspecified: Secondary | ICD-10-CM | POA: Diagnosis not present

## 2021-01-17 DIAGNOSIS — M8589 Other specified disorders of bone density and structure, multiple sites: Secondary | ICD-10-CM | POA: Diagnosis not present

## 2021-01-17 DIAGNOSIS — R609 Edema, unspecified: Secondary | ICD-10-CM | POA: Diagnosis not present

## 2021-01-17 DIAGNOSIS — N390 Urinary tract infection, site not specified: Secondary | ICD-10-CM | POA: Diagnosis not present

## 2021-01-17 DIAGNOSIS — R0989 Other specified symptoms and signs involving the circulatory and respiratory systems: Secondary | ICD-10-CM | POA: Diagnosis not present

## 2021-01-17 DIAGNOSIS — Z1239 Encounter for other screening for malignant neoplasm of breast: Secondary | ICD-10-CM | POA: Diagnosis not present

## 2021-01-17 DIAGNOSIS — E785 Hyperlipidemia, unspecified: Secondary | ICD-10-CM | POA: Diagnosis not present

## 2021-01-17 DIAGNOSIS — Z0000 Encounter for general adult medical examination without abnormal findings: Secondary | ICD-10-CM | POA: Diagnosis not present

## 2021-01-17 DIAGNOSIS — R7301 Impaired fasting glucose: Secondary | ICD-10-CM | POA: Diagnosis not present

## 2021-01-17 DIAGNOSIS — F5104 Psychophysiologic insomnia: Secondary | ICD-10-CM | POA: Diagnosis not present

## 2021-01-17 DIAGNOSIS — E039 Hypothyroidism, unspecified: Secondary | ICD-10-CM | POA: Diagnosis not present

## 2021-03-01 DIAGNOSIS — L300 Nummular dermatitis: Secondary | ICD-10-CM | POA: Diagnosis not present

## 2021-03-01 DIAGNOSIS — L821 Other seborrheic keratosis: Secondary | ICD-10-CM | POA: Diagnosis not present

## 2021-03-29 DIAGNOSIS — L300 Nummular dermatitis: Secondary | ICD-10-CM | POA: Diagnosis not present

## 2021-03-29 DIAGNOSIS — L821 Other seborrheic keratosis: Secondary | ICD-10-CM | POA: Diagnosis not present

## 2021-03-29 DIAGNOSIS — D485 Neoplasm of uncertain behavior of skin: Secondary | ICD-10-CM | POA: Diagnosis not present

## 2021-04-07 DIAGNOSIS — L280 Lichen simplex chronicus: Secondary | ICD-10-CM | POA: Diagnosis not present

## 2021-04-18 DIAGNOSIS — R0989 Other specified symptoms and signs involving the circulatory and respiratory systems: Secondary | ICD-10-CM | POA: Diagnosis not present

## 2021-04-18 DIAGNOSIS — N183 Chronic kidney disease, stage 3 unspecified: Secondary | ICD-10-CM | POA: Diagnosis not present

## 2021-04-18 DIAGNOSIS — R609 Edema, unspecified: Secondary | ICD-10-CM | POA: Diagnosis not present

## 2021-04-18 DIAGNOSIS — R7301 Impaired fasting glucose: Secondary | ICD-10-CM | POA: Diagnosis not present

## 2021-04-18 DIAGNOSIS — F32A Depression, unspecified: Secondary | ICD-10-CM | POA: Diagnosis not present

## 2021-04-18 DIAGNOSIS — Z79899 Other long term (current) drug therapy: Secondary | ICD-10-CM | POA: Diagnosis not present

## 2021-04-18 DIAGNOSIS — G8929 Other chronic pain: Secondary | ICD-10-CM | POA: Diagnosis not present

## 2021-04-18 DIAGNOSIS — F515 Nightmare disorder: Secondary | ICD-10-CM | POA: Diagnosis not present

## 2021-04-18 DIAGNOSIS — M545 Low back pain, unspecified: Secondary | ICD-10-CM | POA: Diagnosis not present

## 2021-04-18 DIAGNOSIS — F419 Anxiety disorder, unspecified: Secondary | ICD-10-CM | POA: Diagnosis not present

## 2021-06-20 DIAGNOSIS — H2513 Age-related nuclear cataract, bilateral: Secondary | ICD-10-CM | POA: Diagnosis not present

## 2021-06-20 DIAGNOSIS — H40033 Anatomical narrow angle, bilateral: Secondary | ICD-10-CM | POA: Diagnosis not present

## 2021-07-22 DIAGNOSIS — N183 Chronic kidney disease, stage 3 unspecified: Secondary | ICD-10-CM | POA: Diagnosis not present

## 2021-07-22 DIAGNOSIS — Z23 Encounter for immunization: Secondary | ICD-10-CM | POA: Diagnosis not present

## 2021-07-22 DIAGNOSIS — R7301 Impaired fasting glucose: Secondary | ICD-10-CM | POA: Diagnosis not present

## 2021-07-22 DIAGNOSIS — Z79899 Other long term (current) drug therapy: Secondary | ICD-10-CM | POA: Diagnosis not present

## 2021-07-22 DIAGNOSIS — F32A Depression, unspecified: Secondary | ICD-10-CM | POA: Diagnosis not present

## 2021-07-22 DIAGNOSIS — G8929 Other chronic pain: Secondary | ICD-10-CM | POA: Diagnosis not present

## 2021-07-22 DIAGNOSIS — M545 Low back pain, unspecified: Secondary | ICD-10-CM | POA: Diagnosis not present

## 2021-07-22 DIAGNOSIS — R609 Edema, unspecified: Secondary | ICD-10-CM | POA: Diagnosis not present

## 2021-07-22 DIAGNOSIS — R0989 Other specified symptoms and signs involving the circulatory and respiratory systems: Secondary | ICD-10-CM | POA: Diagnosis not present

## 2021-07-22 DIAGNOSIS — F419 Anxiety disorder, unspecified: Secondary | ICD-10-CM | POA: Diagnosis not present

## 2021-07-22 DIAGNOSIS — E039 Hypothyroidism, unspecified: Secondary | ICD-10-CM | POA: Diagnosis not present

## 2021-09-05 ENCOUNTER — Other Ambulatory Visit: Payer: Self-pay

## 2021-09-05 ENCOUNTER — Ambulatory Visit: Payer: PPO | Admitting: Urology

## 2021-09-05 ENCOUNTER — Telehealth: Payer: Self-pay | Admitting: Urology

## 2021-09-05 DIAGNOSIS — N3000 Acute cystitis without hematuria: Secondary | ICD-10-CM

## 2021-09-05 DIAGNOSIS — N3021 Other chronic cystitis with hematuria: Secondary | ICD-10-CM

## 2021-09-05 MED ORDER — NITROFURANTOIN MACROCRYSTAL 50 MG PO CAPS: 50.0000 mg | ORAL_CAPSULE | Freq: Every day | ORAL | 0 refills | Status: DC

## 2021-09-05 NOTE — Telephone Encounter (Signed)
Refill x1 submitted to pharmacy.

## 2021-09-05 NOTE — Telephone Encounter (Signed)
Patient rescheduled appt to 12/7 and needs refill of Macrobid until her appt.

## 2021-09-09 ENCOUNTER — Ambulatory Visit: Payer: PPO | Admitting: Urology

## 2021-09-27 ENCOUNTER — Other Ambulatory Visit: Payer: Self-pay

## 2021-09-27 ENCOUNTER — Ambulatory Visit (HOSPITAL_COMMUNITY)
Admission: RE | Admit: 2021-09-27 | Discharge: 2021-09-27 | Disposition: A | Discharge status: Home / Self Care | Payer: PPO | Source: Ambulatory Visit | Attending: Urology | Admitting: Urology

## 2021-09-27 DIAGNOSIS — N200 Calculus of kidney: Secondary | ICD-10-CM | POA: Insufficient documentation

## 2021-09-27 DIAGNOSIS — Z9049 Acquired absence of other specified parts of digestive tract: Secondary | ICD-10-CM | POA: Diagnosis not present

## 2021-09-27 DIAGNOSIS — Z87442 Personal history of urinary calculi: Secondary | ICD-10-CM | POA: Diagnosis not present

## 2021-09-28 ENCOUNTER — Encounter: Payer: Self-pay | Admitting: Urology

## 2021-09-28 ENCOUNTER — Ambulatory Visit: Payer: PPO | Admitting: Urology

## 2021-09-28 VITALS — BP 129/73 | HR 56

## 2021-09-28 DIAGNOSIS — N3021 Other chronic cystitis with hematuria: Secondary | ICD-10-CM

## 2021-09-28 DIAGNOSIS — N200 Calculus of kidney: Secondary | ICD-10-CM

## 2021-09-28 MED ORDER — NITROFURANTOIN MACROCRYSTAL 50 MG PO CAPS
50.0000 mg | ORAL_CAPSULE | Freq: Every day | ORAL | 3 refills | Status: DC
Start: 2021-09-28 — End: 2021-11-01

## 2021-09-28 NOTE — Progress Notes (Signed)
09/28/2021 8:46 AM   Courtney Orozco [birth year] [id number]  Referring provider: Galen Manila, [place] [address]  Followup recurrent UTI and Nephrolithiasis   HPI: Courtney Orozco is a 70yo here for followup for recurrent UTI and nephrolithiasis. NO stone events since last visit. No flank pain. No worsening LUTS. KUb from yesterday shows no calculi. No UTIs since last visit. She is on macrobid 50mg  qhs. No other complaints today.   PMH: Past Medical History:  Diagnosis Date   Anxiety    Arthritis    Back pain    Depression    Diabetes mellitus without complication ([facility])    no meds in 5 years    Gait instability    GERD (gastroesophageal reflux disease)    Headache    hx of migraines    Hepatitis    hx of hep B - 44 years ago    History of kidney stones    Hypertension    Hypothyroidism    Memory loss    Pneumonia    hx of 01/2015    PONV (postoperative nausea and vomiting)    pt states "the last two times ive been put to sleep, my oxygen drops low and they have trouble waking me up.   Thyroid disease    TIA (transient ischemic attack)     Surgical History: Past Surgical History:  Procedure Laterality Date   ABDOMINAL HYSTERECTOMY     APPENDECTOMY     BACK SURGERY     x 4   bladder tack surgery      BREAST SURGERY Right    removal of 3 benign tumors   CATARACT EXTRACTION W/PHACO Left 04/30/2017   Procedure: CATARACT EXTRACTION PHACO AND INTRAOCULAR LENS PLACEMENT ([place]);  Surgeon: Tonny Branch, MD;  Location: [facility];  Service: Ophthalmology;  Laterality: Left;  CDE: 7.16   CATARACT EXTRACTION W/PHACO Right 05/14/2017   Procedure: CATARACT EXTRACTION PHACO AND INTRAOCULAR LENS PLACEMENT RIGHT EYE;  Surgeon: Tonny Branch, MD;  Location: [facility];  Service: Ophthalmology;  Laterality: Right;  CDE: 6.63   CHOLECYSTECTOMY N/A 08/21/2018   Procedure: LAPAROSCOPIC CHOLECYSTECTOMY;  Surgeon: Virl Cagey, MD;  Location: [facility];  Service:  General;  Laterality: N/A;   CYSTOSCOPY WITH STENT PLACEMENT Right 09/28/2017   Procedure: CYSTOSCOPY WITH STENT PLACEMENT;  Surgeon: Franchot Gallo, MD;  Location: [facility];  Service: Urology;  Laterality: Right;   CYSTOSCOPY WITH URETEROSCOPY, STONE BASKETRY AND STENT PLACEMENT Right 04/06/2015   Procedure: CYSTOSCOPY WITH RIGHT URETEROSCOPY, STONE BASKETRY AND STENT PLACEMENT;  Surgeon: Irine Seal, MD;  [facility];  Service: Urology;  Laterality: Right;   CYSTOSCOPY/RETROGRADE/URETEROSCOPY Right 11/28/2017   Procedure: CYSTOSCOPY,  REMOVAL OF RIGHT URETERAL STENT, RIGHT RETROGRADE PYELOGRAM, RIGHT URETEROSCOPY, STONE BASKET EXTRACTION RIGHT URETERAL CALCULUS;  Surgeon: Cleon Gustin, MD;  Location: [facility];  Service: Urology;  Laterality: Right;   HOLMIUM LASER APPLICATION Right 8/50/2774   Procedure: HOLMIUM LASER APPLICATION;  Surgeon: Irine Seal, MD;  [facility];  Service: Urology;  Laterality: Right;   KNEE SURGERY     left   ROTATOR CUFF REPAIR Left    thumb surgery     left    Home Medications:  Allergies as of 09/28/2021       Reactions   Ciprofloxacin Hives, Other (See Comments)   Can take levaquin with no problems per patient and family   Macrobid [nitrofurantoin] Nausea And Vomiting   Azithromycin Itching, Swelling  Sulfa Antibiotics Itching        Medication List        Accurate as of September 28, 2021  8:46 AM. If you have any questions, ask your nurse or doctor.          albuterol 108 (90 Base) MCG/ACT inhaler Commonly known as: VENTOLIN HFA INHALE 2 PUFFS EVERY 4 HOURS AS NEEDED   aspirin 325 MG tablet Take 325 mg by mouth daily.   aspirin-acetaminophen-caffeine 250-250-65 MG tablet Commonly known as: EXCEDRIN MIGRAINE Take 2 tablets by mouth daily as needed (pain).   benzonatate 200 MG capsule Commonly known as: TESSALON Take 1 capsule (200 mg total) by mouth 3 (three) times daily as needed for cough. Swallow whole, do not chew    bisacodyl 5 MG EC tablet Commonly known as: DULCOLAX Take 5 mg by mouth daily as needed for moderate constipation.   buPROPion HCl ER (XL) 450 MG Tb24 Take 1 tablet by mouth every morning.   buPROPion 150 MG 12 hr tablet Commonly known as: WELLBUTRIN SR   cefUROXime 250 MG tablet Commonly known as: CEFTIN Take 1 tablet (250 mg total) by mouth 2 (two) times daily with a meal.   diazepam 5 MG tablet Commonly known as: VALIUM Take by mouth.   diphenhydrAMINE 25 MG tablet Commonly known as: BENADRYL Take 25 mg by mouth daily.   ergocalciferol 1.25 MG (50000 UT) capsule Commonly known as: VITAMIN D2 Take by mouth.   Fluticasone-Salmeterol 250-50 MCG/DOSE Aepb Commonly known as: ADVAIR USE 1 INHALATION TWICE DAILY   gabapentin 100 MG capsule Commonly known as: NEURONTIN Take by mouth.   HYDROcodone-acetaminophen 5-325 MG tablet Commonly known as: NORCO/VICODIN Take by mouth.   lactulose 10 GM/15ML solution Commonly known as: CHRONULAC Take 15 mLs (10 g total) by mouth 2 (two) times daily as needed for mild constipation.   levothyroxine 175 MCG tablet Commonly known as: SYNTHROID   levothyroxine 100 MCG tablet Commonly known as: SYNTHROID Take 175 mcg by mouth daily. Take with 165mcg tablet   Melatonin 10 MG Tabs Take 30 mg by mouth at bedtime.   metoprolol tartrate 25 MG tablet Commonly known as: LOPRESSOR Take 25 mg by mouth 2 (two) times daily.   nitrofurantoin 50 MG capsule Commonly known as: MACRODANTIN Take 1 capsule (50 mg total) by mouth at bedtime.   nortriptyline 50 MG capsule Commonly known as: PAMELOR Take 50 mg by mouth at bedtime.   potassium chloride SA 20 MEQ tablet Commonly known as: KLOR-CON M TAKE TWO (2) TABLETS THREE (3) TIMES DAILY   predniSONE 20 MG tablet Commonly known as: DELTASONE Take 2 tablets (40 mg total) by mouth daily.   torsemide 10 MG tablet Commonly known as: DEMADEX Take 10 mg by mouth daily.   vitamin C 100  MG tablet Take by mouth.   Vitamins/Minerals Tabs Take by mouth.   zinc gluconate 50 MG tablet Take by mouth.   zolpidem 10 MG tablet Commonly known as: AMBIEN Take 10 mg by mouth at bedtime.        Allergies:  Allergies  Allergen Reactions   Ciprofloxacin Hives and Other (See Comments)    Can take levaquin with no problems per patient and family   Macrobid [Nitrofurantoin] Nausea And Vomiting   Azithromycin Itching and Swelling   Sulfa Antibiotics Itching    Family History: Family History  Problem Relation Age of Onset   COPD Father     Social History:  reports that she has  been smoking cigarettes. She has a 25.00 pack-year smoking history. She has never used smokeless tobacco. She reports that she does not drink alcohol and does not use drugs.  ROS: All other review of systems were reviewed and are negative except what is noted above in HPI  Physical Exam: BP 129/73   Pulse (!) 56   Constitutional:  Alert and oriented, No acute distress. HEENT: [place] AT, moist mucus membranes.  Trachea midline, no masses. Cardiovascular: No clubbing, cyanosis, or edema. Respiratory: Normal respiratory effort, no increased work of breathing. GI: Abdomen is soft, nontender, nondistended, no abdominal masses GU: No CVA tenderness.  Lymph: No cervical or inguinal lymphadenopathy. Skin: No rashes, bruises or suspicious lesions. Neurologic: Grossly intact, no focal deficits, moving all 4 extremities. Psychiatric: Normal mood and affect.  Laboratory Data: Lab Results  Component Value Date   WBC 8.8 08/25/2019   HGB 14.9 08/25/2019   HCT 46.6 (H) 08/25/2019   MCV 96.3 08/25/2019   PLT 275 08/25/2019    Lab Results  Component Value Date   CREATININE 1.02 (H) 08/25/2019    No results found for: PSA  No results found for: TESTOSTERONE  Lab Results  Component Value Date   HGBA1C 5.9 (H) 08/16/2018    Urinalysis    Component Value Date/Time   COLORURINE YELLOW  08/16/2018 1101   APPEARANCEUR Clear 09/08/2020 0949   LABSPEC 1.008 08/16/2018 1101   PHURINE 6.0 08/16/2018 1101   GLUCOSEU Negative 09/08/2020 [place] 08/16/2018 1101   BILIRUBINUR Negative 09/08/2020 [place] 08/16/2018 1101   PROTEINUR Negative 09/08/2020 0949   PROTEINUR NEGATIVE 08/16/2018 1101   UROBILINOGEN 0.2 03/03/2020 1043   NITRITE Negative 09/08/2020 0949   NITRITE POSITIVE (A) 08/16/2018 1101   LEUKOCYTESUR Trace (A) 09/08/2020 0949    Lab Results  Component Value Date   LABMICR See below: 09/08/2020   WBCUA 6-10 (A) 09/08/2020   LABEPIT >10 (A) 09/08/2020   BACTERIA Few 09/08/2020    Pertinent Imaging: KUb yesterday: Images reviewed and discussed with the patient Results for orders placed during the hospital encounter of 09/27/21  DG Abd 1 View  Narrative CLINICAL DATA:  Nephrolithiasis.  EXAM: ABDOMEN - 1 VIEW  COMPARISON:  Abdominal x-ray 09/08/2020. CT abdomen and pelvis 07/31/2018.  FINDINGS: The bowel gas pattern is normal. No suspicious radiopaque calculi identified. Stool burden is moderate. There are 2 punctate radiopaque densities projecting over the lower pelvis, likely postsurgical. Cholecystectomy clips are present. Lung bases are clear. No acute fractures. Seen.  IMPRESSION: 1. Nonobstructive bowel gas pattern. 2. No suspicious urinary tract calculi.   Electronically Signed By: Ronney Asters M.D. On: 09/27/2021 22:57  No results found for this or any previous visit.  No results found for this or any previous visit.  No results found for this or any previous visit.  Results for orders placed during the hospital encounter of 01/21/18  US RENAL  Narrative CLINICAL DATA:  Ureteral calculus.  EXAM: RENAL / URINARY TRACT ULTRASOUND COMPLETE  COMPARISON:  CT of the abdomen and pelvis 09/28/2017.  FINDINGS: Right Kidney:  Length: 10.2 cm. Echogenicity within normal limits. No mass  or hydronephrosis visualized.  Left Kidney:  Length: 12.0 cm. Echogenicity within normal limits. A focal echogenicity at the lower pole of the left kidney may represent a 4 mm nonobstructing stone. This was not present on the recent CT scan.  Bladder:  Appears normal for degree of bladder distention.  IMPRESSION: 1. Previously  noted right-sided hydronephrosis has resolved. 2. Possible 4 mm stone at the lower pole left kidney. 3. No obstructive uropathy.   Electronically Signed By: San Morelle M.D. On: 01/21/2018 11:58  No results found for this or any previous visit.  No results found for this or any previous visit.  No results found for this or any previous visit.   Assessment & Plan:    1. Nephrolithiasis -We discussed the management of kidney stones. These options include observation, ureteroscopy, shockwave lithotripsy (ESWL) and percutaneous nephrolithotomy (PCNL). We discussed which options are relevant to the patient's stone(s). We discussed the natural history of kidney stones as well as the complications of untreated stones and the impact on quality of life without treatment as well as with each of the above listed treatments. We also discussed the efficacy of each treatment in its ability to clear the stone burden. With any of these management options I discussed the signs and symptoms of infection and the need for emergent treatment should these be experienced. For each option we discussed the ability of each procedure to clear the patient of their stone burden.   For observation I described the risks which include but are not limited to silent renal damage, life-threatening infection, need for emergent surgery, failure to pass stone and pain.   For ureteroscopy I described the risks which include bleeding, infection, damage to contiguous structures, positioning injury, ureteral stricture, ureteral avulsion, ureteral injury, need for prolonged ureteral stent,  inability to perform ureteroscopy, need for an interval procedure, inability to clear stone burden, stent discomfort/pain, heart attack, stroke, pulmonary embolus and the inherent risks with general anesthesia.   For shockwave lithotripsy I described the risks which include arrhythmia, kidney contusion, kidney hemorrhage, need for transfusion, pain, inability to adequately break up stone, inability to pass stone fragments, Steinstrasse, infection associated with obstructing stones, need for alternate surgical procedure, need for repeat shockwave lithotripsy, MI, CVA, PE and the inherent risks with anesthesia/conscious sedation.   For PCNL I described the risks including positioning injury, pneumothorax, hydrothorax, need for chest tube, inability to clear stone burden, renal laceration, arterial venous fistula or malformation, need for embolization of kidney, loss of kidney or renal function, need for repeat procedure, need for prolonged nephrostomy tube, ureteral avulsion, MI, CVA, PE and the inherent risks of general anesthesia.   - The patient would like to proceed with observation, RTC 1 year with KUB  - Urinalysis, Routine w reflex microscopic  2. Recurrent UTI -We discussed the natural hx of recurrent UTIs and the various causes. We discussed the treatment options including post coital prophylaxis, daily prophylaxis, topical estrogen therapy.  She has done well on macrobid prophylaxis. We will continue macrobid 50mg  qhs  No follow-ups on file.  Nicolette Bang, MD  [facility] [place]

## 2021-09-28 NOTE — Patient Instructions (Signed)
Dietary Guidelines to Help Prevent Kidney Stones Kidney stones are deposits of minerals and salts that form inside your kidneys. Your risk of developing kidney stones may be greater depending on your diet, your lifestyle, the medicines you take, and whether you have certain medical conditions. Most people can lower their chances of developing kidney stones by following the instructions below. Your dietitian may give you more specific instructions depending on your overall health and the type of kidney stones you tend to develop. What are tips for following this plan? Reading food labels  Choose foods with "no salt added" or "low-salt" labels. Limit your salt (sodium) intake to less than 1,500 mg a day. Choose foods with calcium for each meal and snack. Try to eat about 300 mg of calcium at each meal. Foods that contain 200-500 mg of calcium a serving include: 8 oz (237 mL) of milk, calcium-fortifiednon-dairy milk, and calcium-fortifiedfruit juice. Calcium-fortified means that calcium has been added to these drinks. 8 oz (237 mL) of kefir, yogurt, and soy yogurt. 4 oz (114 g) of tofu. 1 oz (28 g) of cheese. 1 cup (150 g) of dried figs. 1 cup (91 g) of cooked broccoli. One 3 oz (85 g) can of sardines or mackerel. Most people need 1,000-1,500 mg of calcium a day. Talk to your dietitian about how much calcium is recommended for you. Shopping Buy plenty of fresh fruits and vegetables. Most people do not need to avoid fruits and vegetables, even if these foods contain nutrients that may contribute to kidney stones. When shopping for convenience foods, choose: Whole pieces of fruit. Pre-made salads with dressing on the side. Low-fat fruit and yogurt smoothies. Avoid buying frozen meals or prepared deli foods. These can be high in sodium. Look for foods with live cultures, such as yogurt and kefir. Choose high-fiber grains, such as whole-wheat breads, oat bran, and wheat cereals. Cooking Do not add  salt to food when cooking. Place a salt shaker on the table and allow each person to add his or her own salt to taste. Use vegetable protein, such as beans, textured vegetable protein (TVP), or tofu, instead of meat in pasta, casseroles, and soups. Meal planning Eat less salt, if told by your dietitian. To do this: Avoid eating processed or pre-made food. Avoid eating fast food. Eat less animal protein, including cheese, meat, poultry, or fish, if told by your dietitian. To do this: Limit the number of times you have meat, poultry, fish, or cheese each week. Eat a diet free of meat at least 2 days a week. Eat only one serving each day of meat, poultry, fish, or seafood. When you prepare animal protein, cut pieces into small portion sizes. For most meat and fish, one serving is about the size of the palm of your hand. Eat at least five servings of fresh fruits and vegetables each day. To do this: Keep fruits and vegetables on hand for snacks. Eat one piece of fruit or a handful of berries with breakfast. Have a salad and fruit at lunch. Have two kinds of vegetables at dinner. Limit foods that are high in a substance called oxalate. These include: Spinach (cooked), rhubarb, beets, sweet potatoes, and Swiss chard. Peanuts. Potato chips, french fries, and baked potatoes with skin on. Nuts and nut products. Chocolate. If you regularly take a diuretic medicine, make sure to eat at least 1 or 2 servings of fruits or vegetables that are high in potassium each day. These include: Avocado. Banana. Orange, prune,   carrot, or tomato juice. Baked potato. Cabbage. Beans and split peas. Lifestyle  Drink enough fluid to keep your urine pale yellow. This is the most important thing you can do. Spread your fluid intake throughout the day. If you drink alcohol: Limit how much you use to: 0-1 drink a day for women who are not pregnant. 0-2 drinks a day for men. Be aware of how much alcohol is in your  drink. In the [place] one drink equals one 12 oz bottle of beer (355 mL), one 5 oz glass of wine (148 mL), or one 1 oz glass of hard liquor (44 mL). Lose weight if told by your health care provider. Work with your dietitian to find an eating plan and weight loss strategies that work best for you. General information Talk to your health care provider and dietitian about taking daily supplements. You may be told the following depending on your health and the cause of your kidney stones: Not to take supplements with vitamin C. To take a calcium supplement. To take a daily probiotic supplement. To take other supplements such as magnesium, fish oil, or vitamin B6. Take over-the-counter and prescription medicines only as told by your health care provider. These include supplements. What foods should I limit? Limit your intake of the following foods, or eat them as told by your dietitian. Vegetables Spinach. Rhubarb. Beets. Canned vegetables. Pickles. Olives. Baked potatoes with skin. Grains Wheat bran. Baked goods. Salted crackers. Cereals high in sugar. Meats and other proteins Nuts. Nut butters. Large portions of meat, poultry, or fish. Salted, precooked, or cured meats, such as sausages, meat loaves, and hot dogs. Dairy Cheese. Beverages Regular soft drinks. Regular vegetable juice. Seasonings and condiments Seasoning blends with salt. Salad dressings. Soy sauce. Ketchup. Barbecue sauce. Other foods Canned soups. Canned pasta sauce. Casseroles. Pizza. Lasagna. Frozen meals. Potato chips. French fries. The items listed above may not be a complete list of foods and beverages you should limit. Contact a dietitian for more information. What foods should I avoid? Talk to your dietitian about specific foods you should avoid based on the type of kidney stones you have and your overall health. Fruits Grapefruit. The item listed above may not be a complete list of foods and beverages you should  avoid. Contact a dietitian for more information. Summary Kidney stones are deposits of minerals and salts that form inside your kidneys. You can lower your risk of kidney stones by making changes to your diet. The most important thing you can do is drink enough fluid. Drink enough fluid to keep your urine pale yellow. Talk to your dietitian about how much calcium you should have each day, and eat less salt and animal protein as told by your dietitian. This information is not intended to replace advice given to you by your health care provider. Make sure you discuss any questions you have with your health care provider. Document Revised: 10/02/2019 Document Reviewed: 10/02/2019 Elsevier Patient Education  2022 [employer] Inc.  

## 2021-09-28 NOTE — Progress Notes (Signed)
Urological Symptom Review  Patient is experiencing the following symptoms: Get up at night to urinate   Review of Systems  Gastrointestinal (upper)  : Negative for upper GI symptoms  Gastrointestinal (lower) : Negative for lower GI symptoms  Constitutional : Weight loss  Skin: Negative for skin symptoms  Eyes: Negative for eye symptoms  Ear/Nose/Throat : Negative for Ear/Nose/Throat symptoms  Hematologic/Lymphatic: Easy bruising  Cardiovascular : Negative for cardiovascular symptoms  Respiratory : Negative for respiratory symptoms  Endocrine: Negative for endocrine symptoms  Musculoskeletal: Back pain Joint pain  Neurological: Negative for neurological symptoms  Psychologic: Depression Anxiety

## 2021-10-04 NOTE — Progress Notes (Signed)
Sent via mail 

## 2021-11-01 ENCOUNTER — Encounter: Payer: Self-pay | Admitting: Orthopaedic Surgery

## 2021-11-01 ENCOUNTER — Ambulatory Visit: Payer: PPO

## 2021-11-01 ENCOUNTER — Other Ambulatory Visit: Payer: Self-pay

## 2021-11-01 ENCOUNTER — Ambulatory Visit: Payer: PPO | Admitting: Orthopaedic Surgery

## 2021-11-01 VITALS — BP 162/100 | HR 63 | Ht 63.0 in | Wt 193.0 lb

## 2021-11-01 DIAGNOSIS — G8929 Other chronic pain: Secondary | ICD-10-CM | POA: Diagnosis not present

## 2021-11-01 DIAGNOSIS — M25512 Pain in left shoulder: Secondary | ICD-10-CM

## 2021-11-01 DIAGNOSIS — S42022A Displaced fracture of shaft of left clavicle, initial encounter for closed fracture: Secondary | ICD-10-CM | POA: Diagnosis not present

## 2021-11-01 NOTE — Progress Notes (Signed)
Subjective:    Patient ID: Courtney Orozco, female    DOB: [birth year], 71 y.o.   MRN: [id number]  HPI She fell before Christmas and hurt her left shoulder.  The pain has not gone away.  She has pain with moving the left arm.  She has swelling and popping  She was seen by her primary care 10-26-21. They told her she had fracture of the left clavicle and she made appointment here.  She does not want a sling or pain medicine.  She has no other injury.  I have reviewed the primary care notes.   Review of Systems  Constitutional:  Positive for activity change.  Respiratory:  Positive for shortness of breath.   Musculoskeletal:  Positive for arthralgias, back pain, myalgias and neck pain.  Neurological:  Positive for headaches.  Psychiatric/Behavioral:  The patient is nervous/anxious.   For Review of Systems, all other systems reviewed and are negative.  The following is a summary of the past history medically, past history surgically, known current medicines, social history and family history.  This information is gathered electronically by the computer from prior information and documentation.  I review this each visit and have found including this information at this point in the chart is beneficial and informative.   Past Medical History:  Diagnosis Date   Anxiety    Arthritis    Back pain    Depression    Diabetes mellitus without complication (HCC)    no meds in 5 years    Gait instability    GERD (gastroesophageal reflux disease)    Headache    hx of migraines    Hepatitis    hx of hep B - 44 years ago    History of kidney stones    Hypertension    Hypothyroidism    Memory loss    Pneumonia    hx of 01/2015    PONV (postoperative nausea and vomiting)    pt states "the last two times ive been put to sleep, my oxygen drops low and they have trouble waking me up.   Thyroid disease    TIA (transient ischemic attack)     Past Surgical History:  Procedure Laterality Date    ABDOMINAL HYSTERECTOMY     APPENDECTOMY     BACK SURGERY     x 4   bladder tack surgery      BREAST SURGERY Right    removal of 3 benign tumors   CATARACT EXTRACTION W/PHACO Left 04/30/2017   Procedure: CATARACT EXTRACTION PHACO AND INTRAOCULAR LENS PLACEMENT ([place]);  Surgeon: Tonny Branch, MD;  Location: [facility];  Service: Ophthalmology;  Laterality: Left;  CDE: 7.16   CATARACT EXTRACTION W/PHACO Right 05/14/2017   Procedure: CATARACT EXTRACTION PHACO AND INTRAOCULAR LENS PLACEMENT RIGHT EYE;  Surgeon: Tonny Branch, MD;  Location: [facility];  Service: Ophthalmology;  Laterality: Right;  CDE: 6.63   CHOLECYSTECTOMY N/A 08/21/2018   Procedure: LAPAROSCOPIC CHOLECYSTECTOMY;  Surgeon: Virl Cagey, MD;  Location: [facility];  Service: General;  Laterality: N/A;   CYSTOSCOPY WITH STENT PLACEMENT Right 09/28/2017   Procedure: CYSTOSCOPY WITH STENT PLACEMENT;  Surgeon: Franchot Gallo, MD;  Location: [facility];  Service: Urology;  Laterality: Right;   CYSTOSCOPY WITH URETEROSCOPY, STONE BASKETRY AND STENT PLACEMENT Right 04/06/2015   Procedure: CYSTOSCOPY WITH RIGHT URETEROSCOPY, STONE BASKETRY AND STENT PLACEMENT;  Surgeon: Irine Seal, MD;  Location: [facility];  Service: Urology;  Laterality: Right;   CYSTOSCOPY/RETROGRADE/URETEROSCOPY Right 11/28/2017  Procedure: CYSTOSCOPY,  REMOVAL OF RIGHT URETERAL STENT, RIGHT RETROGRADE PYELOGRAM, RIGHT URETEROSCOPY, STONE BASKET EXTRACTION RIGHT URETERAL CALCULUS;  Surgeon: Cleon Gustin, MD;  Location: [facility];  Service: Urology;  Laterality: Right;   HOLMIUM LASER APPLICATION Right 5/46/5035   Procedure: HOLMIUM LASER APPLICATION;  Surgeon: Irine Seal, MD;  Location: [facility];  Service: Urology;  Laterality: Right;   KNEE SURGERY     left   ROTATOR CUFF REPAIR Left    thumb surgery     left    Current Outpatient Medications on File Prior to Visit  Medication Sig Dispense Refill   albuterol (VENTOLIN HFA) 108 (90 Base) MCG/ACT inhaler INHALE 2 PUFFS EVERY 4  HOURS AS NEEDED     aspirin 325 MG tablet Take 325 mg by mouth daily.     benzonatate (TESSALON) 200 MG capsule Take 1 capsule (200 mg total) by mouth 3 (three) times daily as needed for cough. Swallow whole, do not chew 21 capsule 0   buPROPion (WELLBUTRIN SR) 150 MG 12 hr tablet      diazepam (VALIUM) 5 MG tablet Take 5 mg by mouth as directed.     diphenhydrAMINE (BENADRYL) 25 MG tablet Take 25 mg by mouth daily.     gabapentin (NEURONTIN) 100 MG capsule Take by mouth.     HYDROcodone-acetaminophen (NORCO/VICODIN) 5-325 MG tablet Take by mouth.     levothyroxine (SYNTHROID) 175 MCG tablet      Melatonin 10 MG TABS Take 30 mg by mouth at bedtime.     metoprolol tartrate (LOPRESSOR) 25 MG tablet Take 25 mg by mouth 2 (two) times daily.     torsemide (DEMADEX) 10 MG tablet Take 10 mg by mouth daily.     No current facility-administered medications on file prior to visit.    Social History   Socioeconomic History   Marital status: Married    Spouse name: Not on file   Number of children: Not on file   Years of education: Not on file   Highest education level: Not on file  Occupational History   Not on file  Tobacco Use   Smoking status: Every Day    Packs/day: 0.50    Years: 50.00    Pack years: 25.00    Types: Cigarettes   Smokeless tobacco: Never  Vaping Use   Vaping Use: Never used  Substance and Sexual Activity   Alcohol use: No   Drug use: No   Sexual activity: Not Currently    Birth control/protection: Post-menopausal  Other Topics Concern   Not on file  Social History Narrative   Not on file   Social Determinants of Health   Financial Resource Strain: Not on file  Food Insecurity: Not on file  Transportation Needs: Not on file  Physical Activity: Not on file  Stress: Not on file  Social Connections: Not on file  Intimate Partner Violence: Not on file    Family History  Problem Relation Age of Onset   COPD Father     BP (!) 162/100    Pulse 63    Ht  5\' 3"  (1.6 m)    Wt 193 lb (87.5 kg)    BMI 34.19 kg/m   Body mass index is 34.19 kg/m.       Objective:   Physical Exam Vitals and nursing note reviewed. Exam conducted with a chaperone present.  Constitutional:      Appearance: She is well-developed.  HENT:     Head: Normocephalic  and atraumatic.  Eyes:     Conjunctiva/sclera: Conjunctivae normal.     Pupils: Pupils are equal, round, and reactive to light.  Cardiovascular:     Rate and Rhythm: Normal rate and regular rhythm.  Pulmonary:     Effort: Pulmonary effort is normal.  Abdominal:     Palpations: Abdomen is soft.  Musculoskeletal:       Arms:     Cervical back: Normal range of motion and neck supple.  Skin:    General: Skin is warm and dry.  Neurological:     Mental Status: She is alert and oriented to person, place, and time.     Cranial Nerves: No cranial nerve deficit.     Motor: No abnormal muscle tone.     Coordination: Coordination normal.     Deep Tendon Reflexes: Reflexes are normal and symmetric. Reflexes normal.  Psychiatric:        Behavior: Behavior normal.        Thought Content: Thought content normal.        Judgment: Judgment normal.  X-rays were done of the left clavicle and left shoulder, reported separately.        Assessment & Plan:   Encounter Diagnoses  Name Primary?   Chronic left shoulder pain Yes   Closed displaced fracture of shaft of left clavicle, initial encounter    Continue as she is doing.  There is healing present.  Return in three weeks.  X-rays on return of left clavicle.  Call if any problem.  Precautions discussed.  Electronically Signed Sanjuana Kava, MD 1/10/20232:36 PM

## 2021-11-24 ENCOUNTER — Ambulatory Visit: Payer: PPO

## 2021-11-24 ENCOUNTER — Other Ambulatory Visit: Payer: Self-pay

## 2021-11-24 ENCOUNTER — Ambulatory Visit (INDEPENDENT_AMBULATORY_CARE_PROVIDER_SITE_OTHER): Payer: PPO | Admitting: Orthopaedic Surgery

## 2021-11-24 ENCOUNTER — Encounter: Payer: Self-pay | Admitting: Orthopaedic Surgery

## 2021-11-24 VITALS — Ht 63.0 in | Wt 193.0 lb

## 2021-11-24 DIAGNOSIS — S42022D Displaced fracture of shaft of left clavicle, subsequent encounter for fracture with routine healing: Secondary | ICD-10-CM

## 2021-11-24 NOTE — Progress Notes (Signed)
My shoulder is less tender.  She has good ROM of the left shoulder and minimal pain.  NV intact.  X-rays were done of the left clavicle, reported separately.  Encounter Diagnosis  Name Primary?   Closed displaced fracture of shaft of left clavicle with routine healing, subsequent encounter Yes   Return in one month. X-rays then of the left clavicle.  Call if any problem.  Precautions discussed.  Electronically Signed Sanjuana Kava, MD 2/2/20239:58 AM

## 2021-12-22 ENCOUNTER — Encounter: Payer: PPO | Admitting: Orthopaedic Surgery

## 2021-12-29 ENCOUNTER — Ambulatory Visit: Payer: PPO

## 2021-12-29 ENCOUNTER — Encounter: Payer: Self-pay | Admitting: Orthopaedic Surgery

## 2021-12-29 ENCOUNTER — Other Ambulatory Visit: Payer: Self-pay

## 2021-12-29 ENCOUNTER — Ambulatory Visit (INDEPENDENT_AMBULATORY_CARE_PROVIDER_SITE_OTHER): Payer: PPO | Admitting: Orthopaedic Surgery

## 2021-12-29 VITALS — Ht 63.0 in | Wt 193.0 lb

## 2021-12-29 DIAGNOSIS — M25561 Pain in right knee: Secondary | ICD-10-CM

## 2021-12-29 DIAGNOSIS — S42022D Displaced fracture of shaft of left clavicle, subsequent encounter for fracture with routine healing: Secondary | ICD-10-CM | POA: Diagnosis not present

## 2021-12-29 NOTE — Progress Notes (Signed)
I fell and hurt my right knee. ? ?She is here for X-rays of the left clavicle.  She has done well with this. ? ?She fell at home several days ago and hurt her right knee.  She has had swelling, popping and giving way.  She had no other injury. She has no redness, no numbness. ? ?The right knee has effusion, crepitus ROM 0 to 100 with medial joint line pain, limp right.  NV intact.  Weakly positive medial McMurray is present. ? ?Left clavicle not painful.  Full ROM.  She has callus "knot". ? ?Encounter Diagnoses  ?Name Primary?  ? Closed displaced fracture of shaft of left clavicle with routine healing, subsequent encounter Yes  ? Acute pain of right knee   ? ?The clavicle has healed. ? ?The right knee has possible meniscus tear. ? ?PROCEDURE NOTE: ? ?The patient requests injections of the right knee , verbal consent was obtained. ? ?The right knee was prepped appropriately after time out was performed.  ? ?Sterile technique was observed and injection of 1 cc of DepoMedrol '40mg'$  with several cc's of plain xylocaine. Anesthesia was provided by ethyl chloride and a 20-gauge needle was used to inject the knee area. The injection was tolerated well.  A band aid dressing was applied. ? ?The patient was advised to apply ice later today and tomorrow to the injection sight as needed. ? ?Return in two weeks. ? ?She may need MRI of the right knee. ? ?Use ice to the knee, use walker. ? ?Call if any problem. ? ?Precautions discussed. ? ?Electronically Signed ?Sanjuana Kava, MD ?3/9/20239:48 AM ? ?

## 2022-01-10 ENCOUNTER — Ambulatory Visit (INDEPENDENT_AMBULATORY_CARE_PROVIDER_SITE_OTHER): Payer: PPO | Admitting: Orthopaedic Surgery

## 2022-01-10 ENCOUNTER — Other Ambulatory Visit: Payer: Self-pay

## 2022-01-10 ENCOUNTER — Encounter: Payer: Self-pay | Admitting: Orthopaedic Surgery

## 2022-01-10 VITALS — BP 134/76 | HR 61 | Ht 63.0 in | Wt 193.0 lb

## 2022-01-10 DIAGNOSIS — M25561 Pain in right knee: Secondary | ICD-10-CM | POA: Diagnosis not present

## 2022-01-10 NOTE — Progress Notes (Signed)
My knee gives way. ? ?She has had several episodes of the right knee swelling and giving way over the last two weeks since the injection.  Her pain is less but she is very concerned about the giving way.  She uses her walker.  She has suffered no injury from the falls. ? ?Right knee has medial pain, crepitus, effusion, ROM 0 to 105, positive medial McMurray, no distal edema, NV intact. ? ?Encounter Diagnosis  ?Name Primary?  ? Acute pain of right knee Yes  ? ?I will get MRI of the right knee.   ? ?Return after the MRI. ? ?I am concerned about medial meniscus tear. ? ?Call if any problem. ? ?Precautions discussed. ? ?Electronically Signed ?Sanjuana Kava, MD ?3/21/20239:38 AM ? ?

## 2022-01-10 NOTE — Progress Notes (Signed)
You can call Central Scheduling at [phone] to schedule your MRI.  Please make sure you have a follow up appointment with Dr Luna Glasgow after the MRI.   ? ?

## 2022-01-12 ENCOUNTER — Ambulatory Visit (HOSPITAL_COMMUNITY)
Admission: RE | Admit: 2022-01-12 | Discharge: 2022-01-12 | Disposition: A | Discharge status: Home / Self Care | Payer: PPO | Source: Ambulatory Visit | Attending: Orthopaedic Surgery | Admitting: Orthopaedic Surgery

## 2022-01-12 ENCOUNTER — Other Ambulatory Visit: Payer: Self-pay

## 2022-01-12 DIAGNOSIS — M25561 Pain in right knee: Secondary | ICD-10-CM | POA: Diagnosis not present

## 2022-01-26 ENCOUNTER — Encounter: Payer: Self-pay | Admitting: Orthopaedic Surgery

## 2022-01-26 ENCOUNTER — Ambulatory Visit: Payer: PPO | Admitting: Orthopaedic Surgery

## 2022-01-26 VITALS — BP 136/80 | HR 58 | Ht 63.0 in | Wt 193.0 lb

## 2022-01-26 DIAGNOSIS — S82001A Unspecified fracture of right patella, initial encounter for closed fracture: Secondary | ICD-10-CM

## 2022-01-26 DIAGNOSIS — S83271A Complex tear of lateral meniscus, current injury, right knee, initial encounter: Secondary | ICD-10-CM

## 2022-01-26 DIAGNOSIS — M25561 Pain in right knee: Secondary | ICD-10-CM

## 2022-01-26 NOTE — Progress Notes (Signed)
My knee is sore but a little better. ? ?She had MRI of the right knee showing: ?IMPRESSION: ?1. Small nondisplaced fracture along the lateral aspect of the mid ?patella, acute to subacute in appearance. ?2. Complex tearing of the lateral meniscal body and posterior horn. ?3. Mild-moderate tricompartmental osteoarthritis. ?4. Small joint effusion and Baker's cyst. ? ?I have explained the findings to her and her daughter.  I have noted the fracture is stable but she needs to use the walker at home all the time.  I will give a knee sleeve brace to use as well. ? ?If she falls or has sudden increased pain in the knee, she is to let us know or go to ER.  I feel the fracture is stable. ? ?As far as the meniscus tear, I want the fracture to heal well first then we can address that.  She understands. ? ?The right knee has crepitus, very slight effusion, ROM 0 to 100 with some pain around patella.  Patella is intact without increased pain to palpation.  NV intact. ? ?Encounter Diagnoses  ?Name Primary?  ? Closed nondisplaced fracture of right patella, unspecified fracture morphology, initial encounter Yes  ? Acute pain of right knee   ? Complex tear of lateral meniscus of right knee as current injury, initial encounter   ? ?Return in two weeks.  Call if any problem. ? ?I have independently reviewed the MRI.   ? ?Call if any problem. ? ?Precautions discussed. ? ?Electronically Signed ?Sanjuana Kava, MD ?4/6/202310:13 AM ? ?

## 2022-01-30 ENCOUNTER — Telehealth: Payer: Self-pay | Admitting: Orthopaedic Surgery

## 2022-01-30 NOTE — Telephone Encounter (Signed)
Patient called and she needs a scooter that she can ride in the yard.  She went and saw Dr. Bobby Rumpf her PCP and she talked to him about it and he said no he would not write the script for her, he said that Dr. Luna Glasgow will have to do it, he is the one that ordered the MRI and he is her orthopedic doctor.  ? ?She wants the script sent to [facility] ?There phone number is [phone] and fax number is [phone]  ? ? ?Please call her back at [phone] ?

## 2022-02-01 ENCOUNTER — Other Ambulatory Visit (HOSPITAL_COMMUNITY): Payer: Self-pay | Admitting: Internal Medicine

## 2022-02-01 ENCOUNTER — Other Ambulatory Visit: Payer: Self-pay | Admitting: Internal Medicine

## 2022-02-01 DIAGNOSIS — R2681 Unsteadiness on feet: Secondary | ICD-10-CM

## 2022-02-01 DIAGNOSIS — R296 Repeated falls: Secondary | ICD-10-CM

## 2022-02-02 ENCOUNTER — Ambulatory Visit: Payer: PPO | Admitting: Orthopaedic Surgery

## 2022-02-09 ENCOUNTER — Ambulatory Visit: Payer: PPO

## 2022-02-09 ENCOUNTER — Other Ambulatory Visit: Payer: Self-pay | Admitting: Orthopaedic Surgery

## 2022-02-09 ENCOUNTER — Ambulatory Visit (INDEPENDENT_AMBULATORY_CARE_PROVIDER_SITE_OTHER): Payer: PPO | Admitting: Orthopaedic Surgery

## 2022-02-09 ENCOUNTER — Encounter: Payer: Self-pay | Admitting: Orthopaedic Surgery

## 2022-02-09 DIAGNOSIS — S82001D Unspecified fracture of right patella, subsequent encounter for closed fracture with routine healing: Secondary | ICD-10-CM

## 2022-02-09 DIAGNOSIS — M25561 Pain in right knee: Secondary | ICD-10-CM | POA: Diagnosis not present

## 2022-02-09 NOTE — Progress Notes (Signed)
My knee is still hurting some. ? ?X-rays were done of the right knee, reported separately.  I do not appreciate fracture of patella seen on MRI. ? ?PROCEDURE NOTE: ? ?The patient requests injections of the right knee , verbal consent was obtained. ? ?The right knee was prepped appropriately after time out was performed.  ? ?Sterile technique was observed and injection of 1 cc of DepoMedrol '40mg'$  with several cc's of plain xylocaine. Anesthesia was provided by ethyl chloride and a 20-gauge needle was used to inject the knee area. The injection was tolerated well.  A band aid dressing was applied. ? ?The patient was advised to apply ice later today and tomorrow to the injection sight as needed. ? ?Encounter Diagnosis  ?Name Primary?  ? Closed nondisplaced fracture of right patella with routine healing, unspecified fracture morphology, subsequent encounter Yes  ? ?Return in two weeks. ? ?X-rays then of the right knee. ? ?Call if any problem. ? ?Precautions discussed. ? ?Electronically Signed ?Sanjuana Kava, MD ?4/20/202310:17 AM ? ?

## 2022-02-17 ENCOUNTER — Ambulatory Visit (HOSPITAL_COMMUNITY)
Admission: RE | Admit: 2022-02-17 | Discharge: 2022-02-17 | Disposition: A | Discharge status: Home / Self Care | Payer: PPO | Source: Ambulatory Visit | Attending: Internal Medicine | Admitting: Internal Medicine

## 2022-02-17 ENCOUNTER — Other Ambulatory Visit (HOSPITAL_COMMUNITY): Payer: Self-pay | Admitting: Internal Medicine

## 2022-02-17 DIAGNOSIS — R2681 Unsteadiness on feet: Secondary | ICD-10-CM

## 2022-02-17 DIAGNOSIS — R296 Repeated falls: Secondary | ICD-10-CM | POA: Diagnosis present

## 2022-02-17 DIAGNOSIS — Z1231 Encounter for screening mammogram for malignant neoplasm of breast: Secondary | ICD-10-CM

## 2022-02-17 MED ORDER — GADOBUTROL 1 MMOL/ML IV SOLN
9.0000 mL | Freq: Once | INTRAVENOUS | Status: AC | PRN
  Administered 2022-02-17: 9 mL via INTRAVENOUS

## 2022-02-23 ENCOUNTER — Ambulatory Visit (INDEPENDENT_AMBULATORY_CARE_PROVIDER_SITE_OTHER): Payer: PPO

## 2022-02-23 ENCOUNTER — Ambulatory Visit: Payer: PPO | Admitting: Orthopaedic Surgery

## 2022-02-23 ENCOUNTER — Encounter: Payer: Self-pay | Admitting: Orthopaedic Surgery

## 2022-02-23 ENCOUNTER — Other Ambulatory Visit: Payer: Self-pay | Admitting: Orthopaedic Surgery

## 2022-02-23 DIAGNOSIS — S82001D Unspecified fracture of right patella, subsequent encounter for closed fracture with routine healing: Secondary | ICD-10-CM

## 2022-02-23 NOTE — Progress Notes (Signed)
My knee is better. ? ?She has been using the walker and having little pain in the knee.  The injection helped. ? ?Her husband had a heart emergency last week.  He had pacemaker inserted and is doing much better. ? ?She has some swelling of the right knee but no new trauma.  She is using her walker. ? ?Right knee has effusion, crepitus, ROM 0 to 105, medial side pain, positive medial McMurray.  NV intact.  No distal edema. ? ?Encounter Diagnosis  ?Name Primary?  ? Closed nondisplaced fracture of right patella with routine healing, unspecified fracture morphology, subsequent encounter Yes  ? ?X-rays were done of the right patella, reported separately. ? ?Return in six weeks. ? ?Continue walker. ? ?Call if any problem. ? ?Precautions discussed. ? ?Electronically Signed ?Sanjuana Kava, MD ?5/4/202310:13 AM ? ?

## 2022-03-02 ENCOUNTER — Ambulatory Visit (HOSPITAL_COMMUNITY)
Admission: RE | Admit: 2022-03-02 | Discharge: 2022-03-02 | Disposition: A | Discharge status: Home / Self Care | Payer: PPO | Source: Ambulatory Visit | Attending: Internal Medicine | Admitting: Internal Medicine

## 2022-03-02 DIAGNOSIS — Z1231 Encounter for screening mammogram for malignant neoplasm of breast: Secondary | ICD-10-CM | POA: Diagnosis present

## 2022-04-04 ENCOUNTER — Telehealth: Payer: Self-pay | Admitting: Orthopaedic Surgery

## 2022-04-04 ENCOUNTER — Ambulatory Visit: Payer: PPO | Admitting: Orthopaedic Surgery

## 2022-04-04 ENCOUNTER — Encounter: Payer: Self-pay | Admitting: Orthopaedic Surgery

## 2022-04-04 VITALS — BP 117/65 | HR 75 | Ht 63.0 in | Wt 199.0 lb

## 2022-04-04 DIAGNOSIS — S82001D Unspecified fracture of right patella, subsequent encounter for closed fracture with routine healing: Secondary | ICD-10-CM

## 2022-04-04 DIAGNOSIS — S83271A Complex tear of lateral meniscus, current injury, right knee, initial encounter: Secondary | ICD-10-CM

## 2022-04-04 NOTE — Progress Notes (Signed)
My knee is better.  She has healed patella fracture now.  She has tear of lateral meniscus.  I will have Dr. Aline Brochure see her for that.  Right knee has pain, slight effusion, crepitus, pain laterally, positive lateral McMurray, limp right, uses walker, NV intact.  Encounter Diagnoses  Name Primary?   Closed nondisplaced fracture of right patella with routine healing, unspecified fracture morphology, subsequent encounter Yes   Complex tear of lateral meniscus of right knee as current injury, initial encounter    PROCEDURE NOTE:  The patient requests injections of the right knee , verbal consent was obtained.  The right knee was prepped appropriately after time out was performed.   Sterile technique was observed and injection of 1 cc of DepoMedrol '40mg'$  with several cc's of plain xylocaine. Anesthesia was provided by ethyl chloride and a 20-gauge needle was used to inject the knee area. The injection was tolerated well.  A band aid dressing was applied.  The patient was advised to apply ice later today and tomorrow to the injection sight as needed.  To see Dr. Aline Brochure for possible menisectomy.  Call if any problem.  Precautions discussed.  Electronically Signed Sanjuana Kava, MD 6/13/202310:21 AM

## 2022-04-04 NOTE — Telephone Encounter (Signed)
Reached - Patient aware.

## 2022-04-04 NOTE — Patient Instructions (Signed)
Return to clinic to see Dr. Aline Brochure.

## 2022-04-04 NOTE — Telephone Encounter (Signed)
-----   Message from Sanjuana Kava, MD sent at 04/04/2022 10:39 AM EDT ----- Regarding: RE: Patient question after today's visit about driving yes ----- Message ----- From: Uvaldo Bristle Sent: 04/04/2022  10:30 AM EDT To: Sanjuana Kava, MD Subject: Patient question after today's visit about d#  [name], [name] [[id number]] states she meant to ask if she may start driving again.

## 2022-04-06 ENCOUNTER — Ambulatory Visit: Payer: PPO | Admitting: Orthopaedic Surgery

## 2022-04-26 ENCOUNTER — Encounter: Payer: Self-pay | Admitting: Orthopedic Surgery

## 2022-04-26 ENCOUNTER — Ambulatory Visit: Payer: PPO | Admitting: Orthopedic Surgery

## 2022-04-26 VITALS — BP 137/63 | HR 67 | Ht 63.0 in | Wt 199.0 lb

## 2022-04-26 DIAGNOSIS — M23300 Other meniscus derangements, unspecified lateral meniscus, right knee: Secondary | ICD-10-CM

## 2022-04-26 DIAGNOSIS — N1831 Chronic kidney disease, stage 3a: Secondary | ICD-10-CM | POA: Diagnosis not present

## 2022-04-26 DIAGNOSIS — I10 Essential (primary) hypertension: Secondary | ICD-10-CM | POA: Diagnosis not present

## 2022-04-26 DIAGNOSIS — M1711 Unilateral primary osteoarthritis, right knee: Secondary | ICD-10-CM

## 2022-04-26 DIAGNOSIS — G4700 Insomnia, unspecified: Secondary | ICD-10-CM | POA: Insufficient documentation

## 2022-04-26 DIAGNOSIS — Z01818 Encounter for other preprocedural examination: Secondary | ICD-10-CM

## 2022-04-26 DIAGNOSIS — M1710 Unilateral primary osteoarthritis, unspecified knee: Secondary | ICD-10-CM

## 2022-04-26 NOTE — Patient Instructions (Addendum)
Your surgery will be at [facility] by Dr Aline Brochure  The hospital will contact you with a preoperative appointment to discuss Anesthesia.  The phone number is [phone]  Please bring your medications with you for the appointment. They will tell you the arrival time and medication instructions when you have your preoperative evaluation. Do not wear nail polish the day of your surgery and if you take Phentermine you need to stop this medication ONE WEEK prior to your surgery.   Meniscus Injury, Arthroscopy   Arthroscopy is a surgical procedure that involves the use of a small scope that has a camera and surgical instruments on the end (arthroscope). An arthroscope can be used to repair your meniscus injury.  LET [facility] CARE PROVIDER KNOW ABOUT: Any allergies you have. All medicines you are taking, including vitamins, herbs, eyedrops, creams, and over-the-counter medicines. Any recent colds or infections you have had or currently have. Previous problems you or members of your family have had with the use of anesthetics. Any blood disorders or blood clotting problems you have. Previous surgeries you have had. Medical conditions you have. RISKS AND COMPLICATIONS Generally, this is a safe procedure. However, as with any procedure, problems can occur. Possible problems include: Damage to nerves or blood vessels. Excess bleeding. Blood clots. Infection. BEFORE THE PROCEDURE Do not eat or drink for 6-8 hours before the procedure. Take medicines as directed by your surgeon. Ask your surgeon about changing or stopping your regular medicines. You may have lab tests the morning of surgery. PROCEDURE  You will be given one of the following:  A medicine that numbs the area (local anesthesia). A medicine that makes you go to sleep (general anesthesia). A medicine injected into your spine that numbs your body below the waist (spinal anesthesia). Most often, several small cuts (incisions) are  made in the knee. The arthroscope and instruments go into the incisions to repair the damage. The torn portion of the meniscus is removed.   AFTER THE PROCEDURE You will be taken to the recovery area where your progress will be monitored. When you are awake, stable, and taking fluids without complications, you will be allowed to go home. This is usually the same day. A torn or stretched ligament (ligament sprain) may take 6-8 weeks to heal.  It takes about the 4-6 WEEKS if your surgeon removed a torn meniscus. A repaired meniscus may require 6-12 weeks of recovery time. A torn ligament needing reconstructive surgery may take 6-12 months to heal fully.   This information is not intended to replace advice given to you by your health care provider. Make sure you discuss any questions you have with your health care provider. You have decided to proceed with operative arthroscopy of the knee. You have decided not to continue with nonoperative measures such as but not limited to oral medication, weight loss, activity modification, physical therapy, bracing, or injection.  We will perform operative arthroscopy of the knee. Some of the risks associated with arthroscopic surgery of the knee include but are not limited to Bleeding Infection Swelling Stiffness Blood clot Pain Need for knee replacement surgery    In compliance with recent [place] law in federal regulation regarding opioid use and abuse and addiction, we will taper (stop) opioid medication after 2 weeks.  If you're not comfortable with these risks and would like to continue with nonoperative treatment please let Dr. Aline Brochure know prior to your surgery.

## 2022-04-26 NOTE — Progress Notes (Addendum)
Chief Complaint  Patient presents with   Knee Pain    Right / surgical consult     Courtney Orozco  04/26/2022    ASSESSMENT AND PLAN:     Encounter Diagnoses  Name Primary?   Derangement of lateral meniscus of right knee Yes   Preop examination    Stage 3a chronic kidney disease ([place])    Primary hypertension    Primary localized osteoarthritis of knee     The procedure has been fully reviewed with the patient; The risks and benefits of surgery have been discussed and explained and understood. Alternative treatment has also been reviewed, questions were encouraged and answered. The postoperative plan is also been reviewed.   Arthroscopy right knee lateral meniscectomy.  Her rehab will probably take 6 weeks instead of 4  She will have some residual osteoarthritis pain and she is aware of this.  Chief Complaint  Patient presents with   Knee Pain    Right / surgical consult    This is a 71 year old female presents for surgical consultation at the request of Dr. Luna Glasgow after several falls  She had a fracture of her right patella which is nondisplaced treated nonoperatively.  During the course of this work-up an MRI showed she had lateral compartment arthritis with lateral meniscal tear which was also symptomatic.  After a 21-monthcourse of nonoperative treatment her pain continued and she presents now for surgical evaluation with a lateral meniscus tear lateral arthritis giving way of the right leg pain from the right lateral leg down into the left foot and frequent falls.      HISTORY SECTION :   Current Outpatient Medications:    aspirin 325 MG tablet, Take 325 mg by mouth daily., Disp: , Rfl:    citalopram (CELEXA) 10 MG tablet, Take 10 mg by mouth daily., Disp: , Rfl:    diphenhydrAMINE (BENADRYL) 25 MG tablet, Take 25 mg by mouth daily., Disp: , Rfl:    gabapentin (NEURONTIN) 100 MG capsule, Take by mouth., Disp: , Rfl:    HYDROcodone-acetaminophen (NORCO/VICODIN)  5-325 MG tablet, Take by mouth., Disp: , Rfl:    levothyroxine (SYNTHROID) 175 MCG tablet, , Disp: , Rfl:    Melatonin 10 MG TABS, Take 30 mg by mouth at bedtime., Disp: , Rfl:    metoprolol tartrate (LOPRESSOR) 25 MG tablet, Take 25 mg by mouth 2 (two) times daily., Disp: , Rfl:    torsemide (DEMADEX) 10 MG tablet, Take 10 mg by mouth daily., Disp: , Rfl:    albuterol (VENTOLIN HFA) 108 (90 Base) MCG/ACT inhaler, INHALE 2 PUFFS EVERY 4 HOURS AS NEEDED (Patient not taking: Reported on 04/26/2022), Disp: , Rfl:    benzonatate (TESSALON) 200 MG capsule, Take 1 capsule (200 mg total) by mouth 3 (three) times daily as needed for cough. Swallow whole, do not chew (Patient not taking: Reported on 04/26/2022), Disp: 21 capsule, Rfl: 0   diazepam (VALIUM) 5 MG tablet, Take 5 mg by mouth as directed., Disp: , Rfl:   Allergies  Allergen Reactions   Ciprofloxacin Hives and Other (See Comments)    Can take levaquin with no problems per patient and family   Sulfa Antibiotics Itching     Review of Systems  Musculoskeletal:  Positive for back pain, falls, joint pain and myalgias.  Neurological:  Positive for sensory change and weakness.     has a past medical history of Anxiety, Arthritis, Back pain, Depression, Diabetes mellitus without complication ([place], Gait instability,  GERD (gastroesophageal reflux disease), Headache, Hepatitis, History of kidney stones, Hypertension, Hypothyroidism, Memory loss, Pneumonia, PONV (postoperative nausea and vomiting), Thyroid disease, and TIA (transient ischemic attack).   Past Surgical History:  Procedure Laterality Date   ABDOMINAL HYSTERECTOMY     APPENDECTOMY     BACK SURGERY     x 4   bladder tack surgery      BREAST SURGERY Right    removal of 3 benign tumors   CATARACT EXTRACTION W/PHACO Left 04/30/2017   Procedure: CATARACT EXTRACTION PHACO AND INTRAOCULAR LENS PLACEMENT ([place]);  Surgeon: Tonny Branch, MD;  Location: [facility];  Service: Ophthalmology;   Laterality: Left;  CDE: 7.16   CATARACT EXTRACTION W/PHACO Right 05/14/2017   Procedure: CATARACT EXTRACTION PHACO AND INTRAOCULAR LENS PLACEMENT RIGHT EYE;  Surgeon: Tonny Branch, MD;  Location: [facility];  Service: Ophthalmology;  Laterality: Right;  CDE: 6.63   CHOLECYSTECTOMY N/A 08/21/2018   Procedure: LAPAROSCOPIC CHOLECYSTECTOMY;  Surgeon: Virl Cagey, MD;  Location: [facility];  Service: General;  Laterality: N/A;   CYSTOSCOPY WITH STENT PLACEMENT Right 09/28/2017   Procedure: CYSTOSCOPY WITH STENT PLACEMENT;  Surgeon: Franchot Gallo, MD;  Location: [facility];  Service: Urology;  Laterality: Right;   CYSTOSCOPY WITH URETEROSCOPY, STONE BASKETRY AND STENT PLACEMENT Right 04/06/2015   Procedure: CYSTOSCOPY WITH RIGHT URETEROSCOPY, STONE BASKETRY AND STENT PLACEMENT;  Surgeon: Irine Seal, MD;  Location: [facility];  Service: Urology;  Laterality: Right;   CYSTOSCOPY/RETROGRADE/URETEROSCOPY Right 11/28/2017   Procedure: CYSTOSCOPY,  REMOVAL OF RIGHT URETERAL STENT, RIGHT RETROGRADE PYELOGRAM, RIGHT URETEROSCOPY, STONE BASKET EXTRACTION RIGHT URETERAL CALCULUS;  Surgeon: Cleon Gustin, MD;  Location: [facility];  Service: Urology;  Laterality: Right;   HOLMIUM LASER APPLICATION Right 9/38/1017   Procedure: HOLMIUM LASER APPLICATION;  Surgeon: Irine Seal, MD;  Location: [facility];  Service: Urology;  Laterality: Right;   KNEE SURGERY     left   ROTATOR CUFF REPAIR Left    thumb surgery     left    Social History   Socioeconomic History   Marital status: Married    Spouse name: Not on file   Number of children: Not on file   Years of education: Not on file   Highest education level: Not on file  Occupational History   Not on file  Tobacco Use   Smoking status: Every Day    Packs/day: 0.50    Years: 50.00    Total pack years: 25.00    Types: Cigarettes   Smokeless tobacco: Never  Vaping Use   Vaping Use: Never used  Substance and Sexual Activity   Alcohol use: No   Drug use: No    Sexual activity: Not Currently    Birth control/protection: Post-menopausal  Other Topics Concern   Not on file  Social History Narrative   Not on file   Social Determinants of Health   Financial Resource Strain: Not on file  Food Insecurity: Not on file  Transportation Needs: Not on file  Physical Activity: Not on file  Stress: Not on file  Social Connections: Not on file  Intimate Partner Violence: Not on file     Family History  Problem Relation Age of Onset   COPD Father       PHYSICAL EXAM SECTION: BP 137/63   Pulse 67   Ht '5\' 3"'$  (1.6 m)   Wt 199 lb (90.3 kg)   BMI 35.25 kg/m   Body mass index is 35.25 kg/m.   General appearance: Well-developed  well-nourished no gross deformities  Eyes clear normal vision no evidence of conjunctivitis or jaundice, extraocular muscles intact  ENT: ears hearing normal, nasal passages clear, throat clear   Neck is supple without palpable mass, full range of motion   Cardiovascular normal pulse and perfusion in all 4 extremities normal color without edema  Lymph nodes: No lymphadenopathy  Neurologically deep tendon reflexes are equal and normal, no sensation loss or deficits no pathologic reflexes   Skin no lacerations or ulcerations no nodularity no palpable masses, no erythema or nodularity  Psychological: Awake alert and oriented x3 mood and affect normal  Musculoskeletal: Tenderness in the lateral compartment including lateral femoral bony tenderness and joint line tenderness.  Small to moderate-sized effusion decreased flexion extension also noted.  Muscle tone and strength normal no ligamentous instability detected  Imaging: I personally read the images and my interpretation is she has osteoarthritis in the left knee primarily in the lateral compartment along with a lateral meniscal tear.  Her overall alignment is slight valgus.  Narrowing of the joint and osteophyte formation is seen on plain film  This 71 year old  female with a history of multiple back surgeries multiple falls associated with her chronic back pain.  She is probably falling secondary to the back pain her lateral leg pain that exits out her foot is probably caused by the back condition  However, she does have lateral compartment pain consistent with her x-ray and MRI findings of lateral compartment arthrosis and lateral meniscal tear  She wishes to proceed with arthroscopic surgery for lateral meniscectomy  Her recovery will probably be longer perhaps 6 weeks rather than 3 or 4    10:32 AM  Arther Abbott

## 2022-05-03 NOTE — Patient Instructions (Signed)
Courtney Orozco  05/03/2022     '@[email or web address]'$ @   Your procedure is scheduled on  05/09/2022.   Report to [name] at  1115  A.M.   Call this number if you have problems the morning of surgery:  [phone]   Remember:  Do not eat or drink after midnight.       Use your inhaler before you come and bring your rescue inhaler with you.    Take these medicines the morning of surgery with A SIP OF WATER         valium (if needed), norco (if needed), levothyroxine, lopressor.     Do not wear jewelry, make-up or nail polish.  Do not wear lotions, powders, or perfumes, or deodorant.  Do not shave 48 hours prior to surgery.  Men may shave face and neck.  Do not bring valuables to the hospital.  [facility] is not responsible for any belongings or valuables.  Contacts, dentures or bridgework may not be worn into surgery.  Leave your suitcase in the car.  After surgery it may be brought to your room.  For patients admitted to the hospital, discharge time will be determined by your treatment team.  Patients discharged the day of surgery will not be allowed to drive home and must have someone with them for 24 hours.    Special instructions:   DO NOT smoke tobacco or vape for 24 hours before your procedure.  Please read over the following fact sheets that you were given. Coughing and Deep Breathing, Surgical Site Infection Prevention, Anesthesia Post-op Instructions, and Care and Recovery After Surgery      Arthroscopic Knee Ligament Repair, Care After This sheet gives you information about how to care for yourself after your procedure. Your health care provider may also give you more specific instructions. If you have problems or questions, contact your health care provider. What can I expect after the procedure? After the procedure, it is common to have: Soreness or pain in your knee. Bruising and swelling on your knee, calf, and ankle for 3-4 days. A small  amount of fluid coming from the incisions. Follow these instructions at home: Medicines Take over-the-counter and prescription medicines only as told by your health care provider. Ask your health care provider if the medicine prescribed to you: Requires you to avoid driving or using machinery. Can cause constipation. You may need to take these actions to prevent or treat constipation: Drink enough fluid to keep your urine pale yellow. Take over-the-counter or prescription medicines. Eat foods that are high in fiber, such as beans, whole grains, and fresh fruits and vegetables. Limit foods that are high in fat and processed sugars, such as fried or sweet foods. If you have a brace or immobilizer: Wear it as told by your health care provider. Remove it only as told by your health care provider. Loosen it if your toes tingle, become numb, or turn cold and blue. Keep it clean and dry. Ask your health care provider when it is safe to drive. Bathing Do not take baths, swim, or use a hot tub until your health care provider approves. Keep your bandage (dressing) dry until your health care provider says that it can be removed. If the brace or immobilizer is not waterproof: Do not let it get wet. Cover it with a watertight covering when you take a bath or shower. Incision care  Follow instructions from your health care  provider about how to take care of your incisions. Make sure you: Wash your hands with soap and water for at least 20 seconds before and after you change your dressing. If soap and water are not available, use hand sanitizer. Change your dressing as told by your health care provider. Leave stitches (sutures), skin glue, or adhesive strips in place. These skin closures may need to stay in place for 2 weeks or longer. If adhesive strip edges start to loosen and curl up, you may trim the loose edges. Do not remove adhesive strips completely unless your health care provider tells you to  do that. Check your incision areas every day for signs of infection. Check for: Redness. More swelling or pain. Blood or more fluid. Warmth. Pus or a bad smell. Managing pain, stiffness, and swelling  If directed, put ice on the affected area. To do this: If you have a removable brace or immobilizer, remove it as told by your health care provider. Put ice in a plastic bag. Place a towel between your skin and the bag. Leave the ice on for 20 minutes, 2-3 times a day. Remove the ice if your skin turns bright red. This is very important. If you cannot feel pain, heat, or cold, you have a greater risk of damage to the area. Move your toes often to reduce stiffness and swelling. Raise (elevate) the injured area above the level of your heart while you are sitting or lying down. Activity Do not use your knee to support your body weight until your health care provider says that you can. Use crutches or other devices as told by your health care provider. Do physical therapy exercises as told by your health care provider. Physical therapy will help you regain movement and strength in your knee. Follow instructions from your health care provider about: When you may start motion exercises. When you may start riding a stationary bike and doing other low-impact activities. When you may start to jog and do other high-impact activities. Do not lift anything that is heavier than 10 lb (4.5 kg), or the limit that you are told, until your health care provider says that it is safe. Ask your health care provider what activities are safe for you. General instructions Do not use any products that contain nicotine or tobacco, such as cigarettes, e-cigarettes, and chewing tobacco. These can delay healing. If you need help quitting, ask your health care provider. Wear compression stockings as told by your health care provider. These stockings help to prevent blood clots and reduce swelling in your legs. Keep all  follow-up visits. This is important. Contact a health care provider if: You have any of these signs of infection: Redness around an incision. Blood or more fluid coming from an incision. Warmth coming from an incision. Pus or a bad smell coming from an incision. More swelling or pain in your knee. A fever or chills. You have pain that does not get better with medicine. Your incision opens up. Get help right away if: You have trouble breathing. You have chest pain. You have increased pain or swelling in your calf or at the back of your knee. You have numbness and tingling near the knee joint or in the foot, ankle, or toes. You notice that your foot or toes look darker than normal or are cooler than normal. These symptoms may represent a serious problem that is an emergency. Do not wait to see if the symptoms will go away. Get medical  help right away. Call your local emergency services (911 in the [place] Do not drive yourself to the hospital. Summary After the procedure, it is common to have knee pain with bruising and swelling on your knee, calf, and ankle. Icing your knee and raising your leg above the level of your heart will help control the pain and swelling. Do physical therapy exercises as told by your health care provider. Physical therapy will help you regain movement and strength in your knee. This information is not intended to replace advice given to you by your health care provider. Make sure you discuss any questions you have with your health care provider. Document Revised: 03/08/2020 Document Reviewed: 03/08/2020 Elsevier Patient Education  [place] Anesthesia, Adult, Care After This sheet gives you information about how to care for yourself after your procedure. Your health care provider may also give you more specific instructions. If you have problems or questions, contact your health care provider. What can I expect after the procedure? After the  procedure, the following side effects are common: Pain or discomfort at the IV site. Nausea. Vomiting. Sore throat. Trouble concentrating. Feeling cold or chills. Feeling weak or tired. Sleepiness and fatigue. Soreness and body aches. These side effects can affect parts of the body that were not involved in surgery. Follow these instructions at home: For the time period you were told by your health care provider:  Rest. Do not participate in activities where you could fall or become injured. Do not drive or use machinery. Do not drink alcohol. Do not take sleeping pills or medicines that cause drowsiness. Do not make important decisions or sign legal documents. Do not take care of children on your own. Eating and drinking Follow any instructions from your health care provider about eating or drinking restrictions. When you feel hungry, start by eating small amounts of foods that are soft and easy to digest (bland), such as toast. Gradually return to your regular diet. Drink enough fluid to keep your urine pale yellow. If you vomit, rehydrate by drinking water, juice, or clear broth. General instructions If you have sleep apnea, surgery and certain medicines can increase your risk for breathing problems. Follow instructions from your health care provider about wearing your sleep device: Anytime you are sleeping, including during daytime naps. While taking prescription pain medicines, sleeping medicines, or medicines that make you drowsy. Have a responsible adult stay with you for the time you are told. It is important to have someone help care for you until you are awake and alert. Return to your normal activities as told by your health care provider. Ask your health care provider what activities are safe for you. Take over-the-counter and prescription medicines only as told by your health care provider. If you smoke, do not smoke without supervision. Keep all follow-up visits as told  by your health care provider. This is important. Contact a health care provider if: You have nausea or vomiting that does not get better with medicine. You cannot eat or drink without vomiting. You have pain that does not get better with medicine. You are unable to pass urine. You develop a skin rash. You have a fever. You have redness around your IV site that gets worse. Get help right away if: You have difficulty breathing. You have chest pain. You have blood in your urine or stool, or you vomit blood. Summary After the procedure, it is common to have a sore throat or nausea. It is also  common to feel tired. Have a responsible adult stay with you for the time you are told. It is important to have someone help care for you until you are awake and alert. When you feel hungry, start by eating small amounts of foods that are soft and easy to digest (bland), such as toast. Gradually return to your regular diet. Drink enough fluid to keep your urine pale yellow. Return to your normal activities as told by your health care provider. Ask your health care provider what activities are safe for you. This information is not intended to replace advice given to you by your health care provider. Make sure you discuss any questions you have with your health care provider. Document Revised: 06/24/2020 Document Reviewed: 01/22/2020 Elsevier Patient Education  [place]. How to Use Chlorhexidine for Bathing Chlorhexidine gluconate (CHG) is a germ-killing (antiseptic) solution that is used to clean the skin. It can get rid of the bacteria that normally live on the skin and can keep them away for about 24 hours. To clean your skin with CHG, you may be given: A CHG solution to use in the shower or as part of a sponge bath. A prepackaged cloth that contains CHG. Cleaning your skin with CHG may help lower the risk for infection: While you are staying in the intensive care unit of the hospital. If you  have a vascular access, such as a central line, to provide short-term or long-term access to your veins. If you have a catheter to drain urine from your bladder. If you are on a ventilator. A ventilator is a machine that helps you breathe by moving air in and out of your lungs. After surgery. What are the risks? Risks of using CHG include: A skin reaction. Hearing loss, if CHG gets in your ears and you have a perforated eardrum. Eye injury, if CHG gets in your eyes and is not rinsed out. The CHG product catching fire. Make sure that you avoid smoking and flames after applying CHG to your skin. Do not use CHG: If you have a chlorhexidine allergy or have previously reacted to chlorhexidine. On babies younger than 70 months of age. How to use CHG solution Use CHG only as told by your health care provider, and follow the instructions on the label. Use the full amount of CHG as directed. Usually, this is one bottle. During a shower Follow these steps when using CHG solution during a shower (unless your health care provider gives you different instructions): Start the shower. Use your normal soap and shampoo to wash your face and hair. Turn off the shower or move out of the shower stream. Pour the CHG onto a clean washcloth. Do not use any type of brush or rough-edged sponge. Starting at your neck, lather your body down to your toes. Make sure you follow these instructions: If you will be having surgery, pay special attention to the part of your body where you will be having surgery. Scrub this area for at least 1 minute. Do not use CHG on your head or face. If the solution gets into your ears or eyes, rinse them well with water. Avoid your genital area. Avoid any areas of skin that have broken skin, cuts, or scrapes. Scrub your back and under your arms. Make sure to wash skin folds. Let the lather sit on your skin for 1-2 minutes or as long as told by your health care provider. Thoroughly  rinse your entire body in the shower.  Make sure that all body creases and crevices are rinsed well. Dry off with a clean towel. Do not put any substances on your body afterward--such as powder, lotion, or perfume--unless you are told to do so by your health care provider. Only use lotions that are recommended by the manufacturer. Put on clean clothes or pajamas. If it is the night before your surgery, sleep in clean sheets.  During a sponge bath Follow these steps when using CHG solution during a sponge bath (unless your health care provider gives you different instructions): Use your normal soap and shampoo to wash your face and hair. Pour the CHG onto a clean washcloth. Starting at your neck, lather your body down to your toes. Make sure you follow these instructions: If you will be having surgery, pay special attention to the part of your body where you will be having surgery. Scrub this area for at least 1 minute. Do not use CHG on your head or face. If the solution gets into your ears or eyes, rinse them well with water. Avoid your genital area. Avoid any areas of skin that have broken skin, cuts, or scrapes. Scrub your back and under your arms. Make sure to wash skin folds. Let the lather sit on your skin for 1-2 minutes or as long as told by your health care provider. Using a different clean, wet washcloth, thoroughly rinse your entire body. Make sure that all body creases and crevices are rinsed well. Dry off with a clean towel. Do not put any substances on your body afterward--such as powder, lotion, or perfume--unless you are told to do so by your health care provider. Only use lotions that are recommended by the manufacturer. Put on clean clothes or pajamas. If it is the night before your surgery, sleep in clean sheets. How to use CHG prepackaged cloths Only use CHG cloths as told by your health care provider, and follow the instructions on the label. Use the CHG cloth on clean, dry  skin. Do not use the CHG cloth on your head or face unless your health care provider tells you to. When washing with the CHG cloth: Avoid your genital area. Avoid any areas of skin that have broken skin, cuts, or scrapes. Before surgery Follow these steps when using a CHG cloth to clean before surgery (unless your health care provider gives you different instructions): Using the CHG cloth, vigorously scrub the part of your body where you will be having surgery. Scrub using a back-and-forth motion for 3 minutes. The area on your body should be completely wet with CHG when you are done scrubbing. Do not rinse. Discard the cloth and let the area air-dry. Do not put any substances on the area afterward, such as powder, lotion, or perfume. Put on clean clothes or pajamas. If it is the night before your surgery, sleep in clean sheets.  For general bathing Follow these steps when using CHG cloths for general bathing (unless your health care provider gives you different instructions). Use a separate CHG cloth for each area of your body. Make sure you wash between any folds of skin and between your fingers and toes. Wash your body in the following order, switching to a new cloth after each step: The front of your neck, shoulders, and chest. Both of your arms, under your arms, and your hands. Your stomach and groin area, avoiding the genitals. Your right leg and foot. Your left leg and foot. The back of your neck, your back,  and your buttocks. Do not rinse. Discard the cloth and let the area air-dry. Do not put any substances on your body afterward--such as powder, lotion, or perfume--unless you are told to do so by your health care provider. Only use lotions that are recommended by the manufacturer. Put on clean clothes or pajamas. Contact a health care provider if: Your skin gets irritated after scrubbing. You have questions about using your solution or cloth. You swallow any chlorhexidine. Call  your local poison control center (1-(773)430-9023 in the [place] Get help right away if: Your eyes itch badly, or they become very red or swollen. Your skin itches badly and is red or swollen. Your hearing changes. You have trouble seeing. You have swelling or tingling in your mouth or throat. You have trouble breathing. These symptoms may represent a serious problem that is an emergency. Do not wait to see if the symptoms will go away. Get medical help right away. Call your local emergency services (911 in the [place] Do not drive yourself to the hospital. Summary Chlorhexidine gluconate (CHG) is a germ-killing (antiseptic) solution that is used to clean the skin. Cleaning your skin with CHG may help to lower your risk for infection. You may be given CHG to use for bathing. It may be in a bottle or in a prepackaged cloth to use on your skin. Carefully follow your health care provider's instructions and the instructions on the product label. Do not use CHG if you have a chlorhexidine allergy. Contact your health care provider if your skin gets irritated after scrubbing. This information is not intended to replace advice given to you by your health care provider. Make sure you discuss any questions you have with your health care provider. Document Revised: 12/20/2020 Document Reviewed: 12/20/2020 Elsevier Patient Education  [place].

## 2022-05-05 ENCOUNTER — Other Ambulatory Visit: Payer: Self-pay

## 2022-05-05 ENCOUNTER — Encounter (HOSPITAL_COMMUNITY)
Admission: RE | Admit: 2022-05-05 | Discharge: 2022-05-05 | Disposition: A | Discharge status: Home / Self Care | Payer: PPO | Source: Ambulatory Visit | Attending: Orthopedic Surgery | Admitting: Orthopedic Surgery

## 2022-05-05 ENCOUNTER — Encounter (HOSPITAL_COMMUNITY): Payer: Self-pay

## 2022-05-05 ENCOUNTER — Other Ambulatory Visit: Payer: Self-pay | Admitting: Orthopedic Surgery

## 2022-05-05 DIAGNOSIS — M23300 Other meniscus derangements, unspecified lateral meniscus, right knee: Secondary | ICD-10-CM

## 2022-05-05 DIAGNOSIS — N1831 Chronic kidney disease, stage 3a: Secondary | ICD-10-CM | POA: Diagnosis not present

## 2022-05-05 DIAGNOSIS — I129 Hypertensive chronic kidney disease with stage 1 through stage 4 chronic kidney disease, or unspecified chronic kidney disease: Secondary | ICD-10-CM | POA: Insufficient documentation

## 2022-05-05 DIAGNOSIS — I10 Essential (primary) hypertension: Secondary | ICD-10-CM

## 2022-05-05 DIAGNOSIS — Z01818 Encounter for other preprocedural examination: Secondary | ICD-10-CM | POA: Diagnosis present

## 2022-05-05 LAB — CBC WITH DIFFERENTIAL/PLATELET
Abs Immature Granulocytes: 0.01 10*3/uL (ref 0.00–0.07)
Basophils Absolute: 0 10*3/uL (ref 0.0–0.1)
Basophils Relative: 1 %
Eosinophils Absolute: 0.1 10*3/uL (ref 0.0–0.5)
Eosinophils Relative: 1 %
HCT: 42.4 % (ref 36.0–46.0)
Hemoglobin: 14 g/dL (ref 12.0–15.0)
Immature Granulocytes: 0 %
Lymphocytes Relative: 44 %
Lymphs Abs: 2.8 10*3/uL (ref 0.7–4.0)
MCH: 31.5 pg (ref 26.0–34.0)
MCHC: 33 g/dL (ref 30.0–36.0)
MCV: 95.5 fL (ref 80.0–100.0)
Monocytes Absolute: 0.5 10*3/uL (ref 0.1–1.0)
Monocytes Relative: 8 %
Neutro Abs: 2.9 10*3/uL (ref 1.7–7.7)
Neutrophils Relative %: 46 %
Platelets: 259 10*3/uL (ref 150–400)
RBC: 4.44 MIL/uL (ref 3.87–5.11)
RDW: 12.1 % (ref 11.5–15.5)
WBC: 6.4 10*3/uL (ref 4.0–10.5)
nRBC: 0 % (ref 0.0–0.2)

## 2022-05-05 NOTE — Addendum Note (Signed)
Addended byCandice Camp on: 05/05/2022 09:19 AM   Modules accepted: Orders

## 2022-05-08 NOTE — H&P (Signed)
Chief Complaint  Patient presents with   Knee Pain      Right / surgical consult       Courtney Orozco   04/26/2022       ASSESSMENT AND PLAN:          Encounter Diagnoses  Name Primary?   Derangement of lateral meniscus of right knee Yes   Preop examination     Stage 3a chronic kidney disease ([place])     Primary hypertension     Primary localized osteoarthritis of knee        The procedure has been fully reviewed with the patient; The risks and benefits of surgery have been discussed and explained and understood. Alternative treatment has also been reviewed, questions were encouraged and answered. The postoperative plan is also been reviewed.    Arthroscopy right knee lateral meniscectomy.  Her rehab will probably take 6 weeks instead of 4  She will have some residual osteoarthritis pain and she is aware of this.       Chief Complaint  Patient presents with   Knee Pain      Right / surgical consult     This is a 71 year old female presents for surgical consultation at the request of Dr. Luna Glasgow after several falls   She had a fracture of her right patella which is nondisplaced treated nonoperatively.  During the course of this work-up an MRI showed she had lateral compartment arthritis with lateral meniscal tear which was also symptomatic.  After a 66-monthcourse of nonoperative treatment her pain continued and she presents now for surgical evaluation with a lateral meniscus tear lateral arthritis giving way of the right leg pain from the right lateral leg down into the left foot and frequent falls.           HISTORY SECTION :     Current Outpatient Medications:    aspirin 325 MG tablet, Take 325 mg by mouth daily., Disp: , Rfl:    citalopram (CELEXA) 10 MG tablet, Take 10 mg by mouth daily., Disp: , Rfl:    diphenhydrAMINE (BENADRYL) 25 MG tablet, Take 25 mg by mouth daily., Disp: , Rfl:    gabapentin (NEURONTIN) 100 MG capsule, Take by mouth., Disp: , Rfl:     HYDROcodone-acetaminophen (NORCO/VICODIN) 5-325 MG tablet, Take by mouth., Disp: , Rfl:    levothyroxine (SYNTHROID) 175 MCG tablet, , Disp: , Rfl:    Melatonin 10 MG TABS, Take 30 mg by mouth at bedtime., Disp: , Rfl:    metoprolol tartrate (LOPRESSOR) 25 MG tablet, Take 25 mg by mouth 2 (two) times daily., Disp: , Rfl:    torsemide (DEMADEX) 10 MG tablet, Take 10 mg by mouth daily., Disp: , Rfl:    albuterol (VENTOLIN HFA) 108 (90 Base) MCG/ACT inhaler, INHALE 2 PUFFS EVERY 4 HOURS AS NEEDED (Patient not taking: Reported on 04/26/2022), Disp: , Rfl:    benzonatate (TESSALON) 200 MG capsule, Take 1 capsule (200 mg total) by mouth 3 (three) times daily as needed for cough. Swallow whole, do not chew (Patient not taking: Reported on 04/26/2022), Disp: 21 capsule, Rfl: 0   diazepam (VALIUM) 5 MG tablet, Take 5 mg by mouth as directed., Disp: , Rfl:         Allergies  Allergen Reactions   Ciprofloxacin Hives and Other (See Comments)      Can take levaquin with no problems per patient and family   Sulfa Antibiotics Itching  Review of Systems  Musculoskeletal:  Positive for back pain, falls, joint pain and myalgias.  Neurological:  Positive for sensory change and weakness.       has a past medical history of Anxiety, Arthritis, Back pain, Depression, Diabetes mellitus without complication ([place]), Gait instability, GERD (gastroesophageal reflux disease), Headache, Hepatitis, History of kidney stones, Hypertension, Hypothyroidism, Memory loss, Pneumonia, PONV (postoperative nausea and vomiting), Thyroid disease, and TIA (transient ischemic attack).         Past Surgical History:  Procedure Laterality Date   ABDOMINAL HYSTERECTOMY       APPENDECTOMY       BACK SURGERY        x 4   bladder tack surgery        BREAST SURGERY Right      removal of 3 benign tumors   CATARACT EXTRACTION W/PHACO Left 04/30/2017    Procedure: CATARACT EXTRACTION PHACO AND INTRAOCULAR LENS PLACEMENT ([place]);   Surgeon: Tonny Branch, MD;  Location: [facility];  Service: Ophthalmology;  Laterality: Left;  CDE: 7.16   CATARACT EXTRACTION W/PHACO Right 05/14/2017    Procedure: CATARACT EXTRACTION PHACO AND INTRAOCULAR LENS PLACEMENT RIGHT EYE;  Surgeon: Tonny Branch, MD;  Location: [facility];  Service: Ophthalmology;  Laterality: Right;  CDE: 6.63   CHOLECYSTECTOMY N/A 08/21/2018    Procedure: LAPAROSCOPIC CHOLECYSTECTOMY;  Surgeon: Virl Cagey, MD;  Location: [facility];  Service: General;  Laterality: N/A;   CYSTOSCOPY WITH STENT PLACEMENT Right 09/28/2017    Procedure: CYSTOSCOPY WITH STENT PLACEMENT;  Surgeon: Franchot Gallo, MD;  Location: [facility];  Service: Urology;  Laterality: Right;   CYSTOSCOPY WITH URETEROSCOPY, STONE BASKETRY AND STENT PLACEMENT Right 04/06/2015    Procedure: CYSTOSCOPY WITH RIGHT URETEROSCOPY, STONE BASKETRY AND STENT PLACEMENT;  Surgeon: Irine Seal, MD;  Location: [facility];  Service: Urology;  Laterality: Right;   CYSTOSCOPY/RETROGRADE/URETEROSCOPY Right 11/28/2017    Procedure: CYSTOSCOPY,  REMOVAL OF RIGHT URETERAL STENT, RIGHT RETROGRADE PYELOGRAM, RIGHT URETEROSCOPY, STONE BASKET EXTRACTION RIGHT URETERAL CALCULUS;  Surgeon: Cleon Gustin, MD;  Location: [facility];  Service: Urology;  Laterality: Right;   HOLMIUM LASER APPLICATION Right 5/79/0383    Procedure: HOLMIUM LASER APPLICATION;  Surgeon: Irine Seal, MD;  Location: [facility];  Service: Urology;  Laterality: Right;   KNEE SURGERY        left   ROTATOR CUFF REPAIR Left     thumb surgery        left      Social History         Socioeconomic History   Marital status: Married      Spouse name: Not on file   Number of children: Not on file   Years of education: Not on file   Highest education level: Not on file  Occupational History   Not on file  Tobacco Use   Smoking status: Every Day      Packs/day: 0.50      Years: 50.00      Total pack years: 25.00      Types: Cigarettes   Smokeless tobacco: Never  Vaping  Use   Vaping Use: Never used  Substance and Sexual Activity   Alcohol use: No   Drug use: No   Sexual activity: Not Currently      Birth control/protection: Post-menopausal  Other Topics Concern   Not on file  Social History Narrative   Not on file    Social Determinants of Health    Financial Resource Strain: Not  on file  Food Insecurity: Not on file  Transportation Needs: Not on file  Physical Activity: Not on file  Stress: Not on file  Social Connections: Not on file  Intimate Partner Violence: Not on file             Family History  Problem Relation Age of Onset   COPD Father            PHYSICAL EXAM SECTION: BP 137/63   Pulse 67   Ht '5\' 3"'$  (1.6 m)   Wt 199 lb (90.3 kg)   BMI 35.25 kg/m   Body mass index is 35.25 kg/m.     General appearance: Well-developed well-nourished no gross deformities   Eyes clear normal vision no evidence of conjunctivitis or jaundice, extraocular muscles intact   ENT: ears hearing normal, nasal passages clear, throat clear    Neck is supple without palpable mass, full range of motion    Cardiovascular normal pulse and perfusion in all 4 extremities normal color without edema   Lymph nodes: No lymphadenopathy  Neurologically deep tendon reflexes are equal and normal, no sensation loss or deficits no pathologic reflexes     Skin no lacerations or ulcerations no nodularity no palpable masses, no erythema or nodularity   Psychological: Awake alert and oriented x3 mood and affect normal   Musculoskeletal: Tenderness in the lateral compartment including lateral femoral bony tenderness and joint line tenderness.  Small to moderate-sized effusion decreased flexion extension also noted.  Muscle tone and strength normal no ligamentous instability detected   Imaging: I personally read the images and my interpretation is she has osteoarthritis in the left knee primarily in the lateral compartment along with a lateral meniscal tear.   Her overall alignment is slight valgus.  Narrowing of the joint and osteophyte formation is seen on plain film  This 71 year old female with a history of multiple back surgeries multiple falls associated with her chronic back pain.  She is probably falling secondary to the back pain her lateral leg pain that exits out her foot is probably caused by the back condition  However, she does have lateral compartment pain consistent with her x-ray and MRI findings of lateral compartment arthrosis and lateral meniscal tear  She wishes to proceed with arthroscopic surgery for lateral meniscectomy right knee  Her recovery will probably be longer perhaps 6 weeks rather than 3 or 4

## 2022-05-09 ENCOUNTER — Other Ambulatory Visit: Payer: Self-pay

## 2022-05-09 ENCOUNTER — Ambulatory Visit (HOSPITAL_COMMUNITY): Payer: PPO | Admitting: Anesthesiology

## 2022-05-09 ENCOUNTER — Ambulatory Visit (HOSPITAL_BASED_OUTPATIENT_CLINIC_OR_DEPARTMENT_OTHER): Payer: PPO | Admitting: Anesthesiology

## 2022-05-09 ENCOUNTER — Encounter (HOSPITAL_COMMUNITY)
Admission: RE | Disposition: A | Discharge status: Home / Self Care | Payer: Self-pay | Source: Home / Self Care | Attending: Orthopedic Surgery

## 2022-05-09 ENCOUNTER — Encounter (HOSPITAL_COMMUNITY): Payer: Self-pay | Admitting: Orthopedic Surgery

## 2022-05-09 ENCOUNTER — Ambulatory Visit (HOSPITAL_COMMUNITY)
Admission: RE | Admit: 2022-05-09 | Discharge: 2022-05-09 | Disposition: A | Discharge status: Home / Self Care | Payer: PPO | Attending: Orthopedic Surgery | Admitting: Orthopedic Surgery

## 2022-05-09 DIAGNOSIS — I129 Hypertensive chronic kidney disease with stage 1 through stage 4 chronic kidney disease, or unspecified chronic kidney disease: Secondary | ICD-10-CM | POA: Insufficient documentation

## 2022-05-09 DIAGNOSIS — Z7984 Long term (current) use of oral hypoglycemic drugs: Secondary | ICD-10-CM | POA: Insufficient documentation

## 2022-05-09 DIAGNOSIS — G9341 Metabolic encephalopathy: Secondary | ICD-10-CM

## 2022-05-09 DIAGNOSIS — D72829 Elevated white blood cell count, unspecified: Secondary | ICD-10-CM

## 2022-05-09 DIAGNOSIS — M1711 Unilateral primary osteoarthritis, right knee: Secondary | ICD-10-CM

## 2022-05-09 DIAGNOSIS — B181 Chronic viral hepatitis B without delta-agent: Secondary | ICD-10-CM

## 2022-05-09 DIAGNOSIS — G8929 Other chronic pain: Secondary | ICD-10-CM | POA: Diagnosis not present

## 2022-05-09 DIAGNOSIS — M94261 Chondromalacia, right knee: Secondary | ICD-10-CM | POA: Insufficient documentation

## 2022-05-09 DIAGNOSIS — Z860100 Personal history of colon polyps, unspecified: Secondary | ICD-10-CM

## 2022-05-09 DIAGNOSIS — W19XXXA Unspecified fall, initial encounter: Secondary | ICD-10-CM | POA: Diagnosis not present

## 2022-05-09 DIAGNOSIS — R609 Edema, unspecified: Secondary | ICD-10-CM

## 2022-05-09 DIAGNOSIS — Z79899 Other long term (current) drug therapy: Secondary | ICD-10-CM | POA: Diagnosis not present

## 2022-05-09 DIAGNOSIS — N3021 Other chronic cystitis with hematuria: Secondary | ICD-10-CM

## 2022-05-09 DIAGNOSIS — I10 Essential (primary) hypertension: Secondary | ICD-10-CM

## 2022-05-09 DIAGNOSIS — R7881 Bacteremia: Secondary | ICD-10-CM

## 2022-05-09 DIAGNOSIS — S83271D Complex tear of lateral meniscus, current injury, right knee, subsequent encounter: Secondary | ICD-10-CM | POA: Diagnosis not present

## 2022-05-09 DIAGNOSIS — R0602 Shortness of breath: Secondary | ICD-10-CM

## 2022-05-09 DIAGNOSIS — F17210 Nicotine dependence, cigarettes, uncomplicated: Secondary | ICD-10-CM

## 2022-05-09 DIAGNOSIS — F419 Anxiety disorder, unspecified: Secondary | ICD-10-CM

## 2022-05-09 DIAGNOSIS — Z8673 Personal history of transient ischemic attack (TIA), and cerebral infarction without residual deficits: Secondary | ICD-10-CM | POA: Insufficient documentation

## 2022-05-09 DIAGNOSIS — S83281A Other tear of lateral meniscus, current injury, right knee, initial encounter: Secondary | ICD-10-CM | POA: Insufficient documentation

## 2022-05-09 DIAGNOSIS — N1831 Chronic kidney disease, stage 3a: Secondary | ICD-10-CM | POA: Diagnosis not present

## 2022-05-09 DIAGNOSIS — Z9889 Other specified postprocedural states: Secondary | ICD-10-CM | POA: Diagnosis not present

## 2022-05-09 DIAGNOSIS — Z800 Family history of malignant neoplasm of digestive organs: Secondary | ICD-10-CM

## 2022-05-09 DIAGNOSIS — E039 Hypothyroidism, unspecified: Secondary | ICD-10-CM | POA: Diagnosis not present

## 2022-05-09 DIAGNOSIS — N200 Calculus of kidney: Secondary | ICD-10-CM

## 2022-05-09 DIAGNOSIS — N201 Calculus of ureter: Secondary | ICD-10-CM

## 2022-05-09 DIAGNOSIS — L304 Erythema intertrigo: Secondary | ICD-10-CM

## 2022-05-09 DIAGNOSIS — R296 Repeated falls: Secondary | ICD-10-CM

## 2022-05-09 DIAGNOSIS — S83271A Complex tear of lateral meniscus, current injury, right knee, initial encounter: Secondary | ICD-10-CM

## 2022-05-09 DIAGNOSIS — S83206A Unspecified tear of unspecified meniscus, current injury, right knee, initial encounter: Secondary | ICD-10-CM

## 2022-05-09 DIAGNOSIS — N390 Urinary tract infection, site not specified: Secondary | ICD-10-CM

## 2022-05-09 DIAGNOSIS — M549 Dorsalgia, unspecified: Secondary | ICD-10-CM | POA: Insufficient documentation

## 2022-05-09 DIAGNOSIS — F32A Depression, unspecified: Secondary | ICD-10-CM | POA: Diagnosis not present

## 2022-05-09 DIAGNOSIS — K8020 Calculus of gallbladder without cholecystitis without obstruction: Secondary | ICD-10-CM

## 2022-05-09 DIAGNOSIS — E119 Type 2 diabetes mellitus without complications: Secondary | ICD-10-CM | POA: Diagnosis not present

## 2022-05-09 DIAGNOSIS — A419 Sepsis, unspecified organism: Secondary | ICD-10-CM

## 2022-05-09 DIAGNOSIS — R7301 Impaired fasting glucose: Secondary | ICD-10-CM

## 2022-05-09 DIAGNOSIS — I952 Hypotension due to drugs: Secondary | ICD-10-CM

## 2022-05-09 DIAGNOSIS — K219 Gastro-esophageal reflux disease without esophagitis: Secondary | ICD-10-CM

## 2022-05-09 DIAGNOSIS — E785 Hyperlipidemia, unspecified: Secondary | ICD-10-CM

## 2022-05-09 DIAGNOSIS — N183 Chronic kidney disease, stage 3 unspecified: Secondary | ICD-10-CM

## 2022-05-09 DIAGNOSIS — A498 Other bacterial infections of unspecified site: Secondary | ICD-10-CM

## 2022-05-09 DIAGNOSIS — G4700 Insomnia, unspecified: Secondary | ICD-10-CM

## 2022-05-09 DIAGNOSIS — J189 Pneumonia, unspecified organism: Secondary | ICD-10-CM

## 2022-05-09 DIAGNOSIS — R55 Syncope and collapse: Secondary | ICD-10-CM

## 2022-05-09 HISTORY — PX: KNEE ARTHROSCOPY WITH LATERAL MENISECTOMY: SHX6193

## 2022-05-09 LAB — GLUCOSE, CAPILLARY: Glucose-Capillary: 138 mg/dL — ABNORMAL HIGH (ref 70–99)

## 2022-05-09 SURGERY — ARTHROSCOPY, KNEE, WITH LATERAL MENISCECTOMY
Anesthesia: General | Site: Knee | Laterality: Right

## 2022-05-09 MED ORDER — ONDANSETRON HCL 4 MG/2ML IJ SOLN
INTRAMUSCULAR | Status: AC
  Filled 2022-05-09: qty 2

## 2022-05-09 MED ORDER — FENTANYL CITRATE (PF) 100 MCG/2ML IJ SOLN
INTRAMUSCULAR | Status: AC
  Filled 2022-05-09: qty 2

## 2022-05-09 MED ORDER — METOCLOPRAMIDE HCL 5 MG/ML IJ SOLN
INTRAMUSCULAR | Status: DC | PRN
  Administered 2022-05-09: 10 mg via INTRAVENOUS

## 2022-05-09 MED ORDER — ORAL CARE MOUTH RINSE: 15.0000 mL | Freq: Once | OROMUCOSAL | Status: AC

## 2022-05-09 MED ORDER — DEXAMETHASONE SODIUM PHOSPHATE 10 MG/ML IJ SOLN
INTRAMUSCULAR | Status: DC | PRN
  Administered 2022-05-09: 4 mg via INTRAVENOUS

## 2022-05-09 MED ORDER — ONDANSETRON HCL 4 MG/2ML IJ SOLN: 4.0000 mg | Freq: Once | INTRAMUSCULAR | Status: DC | PRN

## 2022-05-09 MED ORDER — ACETAMINOPHEN 500 MG PO TABS
500.0000 mg | ORAL_TABLET | Freq: Once | ORAL | Status: AC
  Administered 2022-05-09: 500 mg via ORAL
  Filled 2022-05-09: qty 1

## 2022-05-09 MED ORDER — ONDANSETRON HCL 4 MG/2ML IJ SOLN
INTRAMUSCULAR | Status: DC | PRN
  Administered 2022-05-09: 4 mg via INTRAVENOUS

## 2022-05-09 MED ORDER — CEFAZOLIN SODIUM-DEXTROSE 2-4 GM/100ML-% IV SOLN
INTRAVENOUS | Status: AC
  Filled 2022-05-09: qty 100

## 2022-05-09 MED ORDER — BUPIVACAINE-EPINEPHRINE (PF) 0.5% -1:200000 IJ SOLN
INTRAMUSCULAR | Status: AC
  Filled 2022-05-09: qty 30

## 2022-05-09 MED ORDER — PHENYLEPHRINE 80 MCG/ML (10ML) SYRINGE FOR IV PUSH (FOR BLOOD PRESSURE SUPPORT)
PREFILLED_SYRINGE | INTRAVENOUS | Status: DC | PRN
  Administered 2022-05-09: 80 ug via INTRAVENOUS

## 2022-05-09 MED ORDER — PROPOFOL 10 MG/ML IV BOLUS
INTRAVENOUS | Status: DC | PRN
  Administered 2022-05-09: 150 mg via INTRAVENOUS

## 2022-05-09 MED ORDER — CEFAZOLIN SODIUM-DEXTROSE 2-4 GM/100ML-% IV SOLN
2.0000 g | INTRAVENOUS | Status: AC
  Administered 2022-05-09: 2 g via INTRAVENOUS

## 2022-05-09 MED ORDER — FENTANYL CITRATE (PF) 100 MCG/2ML IJ SOLN
INTRAMUSCULAR | Status: DC | PRN
  Administered 2022-05-09: 50 ug via INTRAVENOUS
  Administered 2022-05-09 (×2): 25 ug via INTRAVENOUS

## 2022-05-09 MED ORDER — HYDROCODONE-ACETAMINOPHEN 5-325 MG PO TABS
1.0000 | ORAL_TABLET | Freq: Once | ORAL | Status: AC
  Administered 2022-05-09: 1 via ORAL
  Filled 2022-05-09: qty 1

## 2022-05-09 MED ORDER — HYDROMORPHONE HCL 1 MG/ML IJ SOLN
0.2500 mg | INTRAMUSCULAR | Status: DC | PRN
  Administered 2022-05-09: 0.5 mg via INTRAVENOUS
  Filled 2022-05-09: qty 0.5

## 2022-05-09 MED ORDER — LACTATED RINGERS IV SOLN: INTRAVENOUS | Status: DC

## 2022-05-09 MED ORDER — SODIUM CHLORIDE 0.9 % IR SOLN
Status: DC | PRN
  Administered 2022-05-09 (×3): 3000 mL

## 2022-05-09 MED ORDER — METOCLOPRAMIDE HCL 5 MG/ML IJ SOLN
INTRAMUSCULAR | Status: AC
  Filled 2022-05-09: qty 2

## 2022-05-09 MED ORDER — EPINEPHRINE PF 1 MG/ML IJ SOLN
INTRAMUSCULAR | Status: AC
  Filled 2022-05-09: qty 1

## 2022-05-09 MED ORDER — CHLORHEXIDINE GLUCONATE 0.12 % MT SOLN
15.0000 mL | Freq: Once | OROMUCOSAL | Status: AC
  Administered 2022-05-09: 15 mL via OROMUCOSAL

## 2022-05-09 MED ORDER — LIDOCAINE 2% (20 MG/ML) 5 ML SYRINGE
INTRAMUSCULAR | Status: DC | PRN
  Administered 2022-05-09: 80 mg via INTRAVENOUS

## 2022-05-09 MED ORDER — BUPIVACAINE-EPINEPHRINE (PF) 0.5% -1:200000 IJ SOLN
INTRAMUSCULAR | Status: DC | PRN
  Administered 2022-05-09: 60 mL via PERINEURAL

## 2022-05-09 MED ORDER — PHENYLEPHRINE 80 MCG/ML (10ML) SYRINGE FOR IV PUSH (FOR BLOOD PRESSURE SUPPORT)
PREFILLED_SYRINGE | INTRAVENOUS | Status: AC
  Filled 2022-05-09: qty 10

## 2022-05-09 MED ORDER — LIDOCAINE HCL (PF) 2 % IJ SOLN
INTRAMUSCULAR | Status: AC
  Filled 2022-05-09: qty 5

## 2022-05-09 SURGICAL SUPPLY — 46 items
ABLATOR ASPIRATE 50D MULTI-PRT (SURGICAL WAND) ×1 IMPLANT
APL PRP STRL LF DISP 70% ISPRP (MISCELLANEOUS) ×2
BLADE SHAVER TORPEDO 4X13 (MISCELLANEOUS) ×1 IMPLANT
BLADE SURG SZ11 CARB STEEL (BLADE) ×3 IMPLANT
BNDG CMPR STD VLCR NS LF 5.8X6 (GAUZE/BANDAGES/DRESSINGS) ×1
BNDG ELASTIC 6X5.8 VLCR NS LF (GAUZE/BANDAGES/DRESSINGS) ×3 IMPLANT
CHLORAPREP W/TINT 26 (MISCELLANEOUS) ×6 IMPLANT
CLOTH BEACON ORANGE TIMEOUT ST (SAFETY) ×3 IMPLANT
COOLER ICEMAN CLASSIC (MISCELLANEOUS) ×3 IMPLANT
CUFF TOURN SGL QUICK 34 (TOURNIQUET CUFF) ×2
CUFF TRNQT CYL 34X4.125X (TOURNIQUET CUFF) IMPLANT
DECANTER SPIKE VIAL GLASS SM (MISCELLANEOUS) ×6 IMPLANT
DRAPE HALF SHEET 40X57 (DRAPES) ×3 IMPLANT
GAUZE 4X4 16PLY ~~LOC~~+RFID DBL (SPONGE) ×3 IMPLANT
GAUZE SPONGE 4X4 12PLY STRL (GAUZE/BANDAGES/DRESSINGS) ×3 IMPLANT
GAUZE XEROFORM 5X9 LF (GAUZE/BANDAGES/DRESSINGS) ×3 IMPLANT
GLOVE BIOGEL PI IND STRL 7.0 (GLOVE) ×4 IMPLANT
GLOVE BIOGEL PI INDICATOR 7.0 (GLOVE) ×2
GLOVE SS N UNI LF 8.5 STRL (GLOVE) ×3 IMPLANT
GLOVE SURG POLYISO LF SZ8 (GLOVE) ×3 IMPLANT
GOWN STRL REUS W/TWL LRG LVL3 (GOWN DISPOSABLE) ×3 IMPLANT
GOWN STRL REUS W/TWL XL LVL3 (GOWN DISPOSABLE) ×3 IMPLANT
IV NS IRRIG 3000ML ARTHROMATIC (IV SOLUTION) ×7 IMPLANT
KIT BLADEGUARD II DBL (SET/KITS/TRAYS/PACK) ×3 IMPLANT
KIT TURNOVER CYSTO (KITS) ×3 IMPLANT
MANIFOLD NEPTUNE II (INSTRUMENTS) ×3 IMPLANT
MARKER SKIN DUAL TIP RULER LAB (MISCELLANEOUS) ×3 IMPLANT
NDL HYPO 18GX1.5 BLUNT FILL (NEEDLE) ×2 IMPLANT
NDL HYPO 21X1.5 SAFETY (NEEDLE) ×2 IMPLANT
NDL SPNL 18GX3.5 QUINCKE PK (NEEDLE) ×2 IMPLANT
NEEDLE HYPO 18GX1.5 BLUNT FILL (NEEDLE) ×2 IMPLANT
NEEDLE HYPO 21X1.5 SAFETY (NEEDLE) ×2 IMPLANT
NEEDLE SPNL 18GX3.5 QUINCKE PK (NEEDLE) ×2 IMPLANT
NS IRRIG 1000ML POUR BTL (IV SOLUTION) ×3 IMPLANT
PACK ARTHRO LIMB DRAPE STRL (MISCELLANEOUS) ×3 IMPLANT
PAD ABD 5X9 TENDERSORB (GAUZE/BANDAGES/DRESSINGS) ×3 IMPLANT
PAD ARMBOARD 7.5X6 YLW CONV (MISCELLANEOUS) ×3 IMPLANT
PAD COLD SHLDR SM WRAP-ON (PAD) ×1 IMPLANT
PAD FOR LEG HOLDER (MISCELLANEOUS) ×3 IMPLANT
PADDING CAST COTTON 6X4 STRL (CAST SUPPLIES) ×3 IMPLANT
SET ARTHROSCOPY INST (INSTRUMENTS) ×3 IMPLANT
SET BASIN LINEN APH (SET/KITS/TRAYS/PACK) ×3 IMPLANT
SUT ETHILON 3 0 FSL (SUTURE) ×3 IMPLANT
SYR 30ML LL (SYRINGE) ×3 IMPLANT
TUBE CONNECTING 12X1/4 (SUCTIONS) ×6 IMPLANT
TUBING IN/OUT FLOW W/MAIN PUMP (TUBING) ×3 IMPLANT

## 2022-05-09 NOTE — Anesthesia Preprocedure Evaluation (Signed)
Anesthesia Evaluation  Patient identified by MRN, date of birth, ID band Patient awake    Reviewed: Allergy & Precautions, NPO status , Patient's Chart, lab work & pertinent test results  History of Anesthesia Complications (+) PONV and history of anesthetic complications  Airway Mallampati: II  TM Distance: >3 FB Neck ROM: Full    Dental  (+) Edentulous Upper, Edentulous Lower   Pulmonary shortness of breath and with exertion, pneumonia, Current Smoker,    Pulmonary exam normal breath sounds clear to auscultation       Cardiovascular hypertension, Pt. on medications Normal cardiovascular exam Rhythm:Regular Rate:Normal     Neuro/Psych  Headaches, PSYCHIATRIC DISORDERS Anxiety Depression TIA   GI/Hepatic GERD  ,(+) Hepatitis -  Endo/Other  diabetes, Well Controlled, Type 2, Oral Hypoglycemic AgentsHypothyroidism   Renal/GU Renal disease  negative genitourinary   Musculoskeletal  (+) Arthritis ,   Abdominal   Peds negative pediatric ROS (+)  Hematology negative hematology ROS (+)   Anesthesia Other Findings   Reproductive/Obstetrics negative OB ROS                             Anesthesia Physical Anesthesia Plan  ASA: 3  Anesthesia Plan: General   Post-op Pain Management: Dilaudid IV   Induction: Intravenous  PONV Risk Score and Plan: 4 or greater and Ondansetron, Dexamethasone and Metaclopromide  Airway Management Planned: LMA  Additional Equipment:   Intra-op Plan:   Post-operative Plan: Extubation in OR  Informed Consent: I have reviewed the patients History and Physical, chart, labs and discussed the procedure including the risks, benefits and alternatives for the proposed anesthesia with the patient or authorized representative who has indicated his/her understanding and acceptance.     Dental advisory given  Plan Discussed with: Surgeon  Anesthesia Plan Comments:          Anesthesia Quick Evaluation

## 2022-05-09 NOTE — Interval H&P Note (Signed)
History and Physical Interval Note:  05/09/2022 10:23 AM  Courtney Orozco  has presented today for surgery, with the diagnosis of right knee arthroscopy lateral meniscus tear.  The various methods of treatment have been discussed with the patient and family. After consideration of risks, benefits and other options for treatment, the patient has consented to  Procedure(s) with comments: KNEE ARTHROSCOPY WITH LATERAL MENISECTOMY (Right) - pt knows to arrive at 9:00 as a surgical intervention.  The patient's history has been reviewed, patient examined, no change in status, stable for surgery.  I have reviewed the patient's chart and labs.  Questions were answered to the patient's satisfaction.     Arther Abbott

## 2022-05-09 NOTE — Anesthesia Procedure Notes (Signed)
Procedure Name: LMA Insertion Date/Time: 05/09/2022 10:45 AM  Performed by: Genelle Bal, CRNAPre-anesthesia Checklist: Patient identified, Emergency Drugs available, Suction available and Patient being monitored Patient Re-evaluated:Patient Re-evaluated prior to induction Oxygen Delivery Method: Circle system utilized Preoxygenation: Pre-oxygenation with 100% oxygen Induction Type: IV induction Ventilation: Mask ventilation without difficulty LMA: LMA inserted LMA Size: 4.0 Number of attempts: 1 Placement Confirmation: positive ETCO2 Tube secured with: Tape Dental Injury: Teeth and Oropharynx as per pre-operative assessment

## 2022-05-09 NOTE — Transfer of Care (Signed)
Immediate Anesthesia Transfer of Care Note  Patient: Courtney Orozco  Procedure(s) Performed: KNEE ARTHROSCOPY WITH LATERAL MENISECTOMY (Right: Knee)  Patient Location: PACU  Anesthesia Type:General  Level of Consciousness: awake, alert  and oriented  Airway & Oxygen Therapy: Patient Spontanous Breathing and Patient connected to nasal cannula oxygen  Post-op Assessment: Report given to RN and Post -op Vital signs reviewed and stable  Post vital signs: Reviewed and stable  Last Vitals:  Vitals Value Taken Time  BP 141/65 05/09/22 1137  Temp    Pulse 73 05/09/22 1140  Resp 12 05/09/22 1140  SpO2 89 % 05/09/22 1140  Vitals shown include unvalidated device data.  Last Pain:  Vitals:   05/09/22 0936  TempSrc: Oral  PainSc: 8          Complications: No notable events documented.

## 2022-05-09 NOTE — Anesthesia Postprocedure Evaluation (Signed)
Anesthesia Post Note  Patient: RAEVIN WIERENGA  Procedure(s) Performed: KNEE ARTHROSCOPY WITH LATERAL MENISECTOMY (Right: Knee)  Patient location during evaluation: Phase II Anesthesia Type: General Level of consciousness: awake and alert and oriented Pain management: pain level controlled Vital Signs Assessment: post-procedure vital signs reviewed and stable Respiratory status: spontaneous breathing, nonlabored ventilation and respiratory function stable Cardiovascular status: blood pressure returned to baseline and stable Postop Assessment: no apparent nausea or vomiting Anesthetic complications: no   No notable events documented.   Last Vitals:  Vitals:   05/09/22 1215 05/09/22 1231  BP:  122/64  Pulse: 71 66  Resp: 15 16  Temp:  36.8 C  SpO2: 95% 93%    Last Pain:  Vitals:   05/09/22 1231  TempSrc: Oral  PainSc: 0-No pain                 Rishab Stoudt C Aprille Sawhney

## 2022-05-09 NOTE — Brief Op Note (Signed)
05/09/2022  11:31 AM  PATIENT:  Courtney Orozco  71 y.o. female  PRE-OPERATIVE DIAGNOSIS:  right knee arthroscopy lateral meniscus tear  POST-OPERATIVE DIAGNOSIS:  right knee arthroscopy lateral meniscus tear  PROCEDURE:  Procedure(s) with comments: KNEE ARTHROSCOPY WITH LATERAL MENISECTOMY (Right) - pt knows to arrive at 9:00  SURGEON:  Surgeon(s) and Role:    * Carole Civil, MD - Primary  PHYSICIAN ASSISTANT: none  ASSISTANTS: none   ANESTHESIA:   general  EBL:  10 mL   BLOOD ADMINISTERED:none  DRAINS: none   LOCAL MEDICATIONS USED:  MARCAINE     SPECIMEN:  No Specimen  DISPOSITION OF SPECIMEN:  N/A  COUNTS:  YES  TOURNIQUET:  * Missing tourniquet times found for documented tourniquets in log: [id number] *  DICTATION: .Viviann Spare Dictation  PLAN OF CARE: Discharge to home after PACU  PATIENT DISPOSITION:  PACU - hemodynamically stable.   Delay start of Pharmacological VTE agent (>24hrs) due to surgical blood loss or risk of bleeding: not applicable

## 2022-05-09 NOTE — Op Note (Signed)
05/09/2022  11:34 AM  Knee arthroscopy dictation  Preop diagnosis torn lateral meniscus right knee  Postop diagnosis same torn lateral meniscus right knee with osteoarthritis right knee  Procedure arthroscopy arthroscopy partial lateral meniscectomy  Surgeon Aline Brochure  Operative findings  MEDIAL - meniscus normal -articular surface grade 2 diffuse chondromalacia  LATERAL - meniscus torn lateral meniscus anterior horn mid body and posterior horn -articular surface grade 3 diffuse chondromalacia  PTF   -articular surface grade II chondromalacia  NOTCH  -acl normal -pcl normal     The patient was identified in the preoperative holding area using 2 approved identification mechanisms. The chart was reviewed and updated. The surgical site was confirmed as right knee and marked with an indelible marker.  The patient was taken to the operating room for anesthesia. After successful LMA general anesthesia, Ancef 2 g was used as IV antibiotics.  The patient was placed in the supine position with the (right) the operative extremity in an arthroscopic leg holder and the opposite extremity in a padded leg holder.  The timeout was executed.  A lateral portal was established with an 11 blade and the scope was introduced into the joint. A diagnostic arthroscopy was performed in circumferential manner examining the entire knee joint. A medial portal was established and the diagnostic arthroscopy was repeated using a probe to palpate intra-articular structures as they were encountered.    The lateral meniscus was resected using a duckbill forceps. The meniscal fragments were removed with a motorized shaver. The meniscus was balanced with a combination of a motorized shaver and a 50 ArthroCare wand until a stable rim was obtained.  The medial side was probed and found to be intact  The arthroscopic pump was placed on the wash mode and any excess debris was removed from the joint using  suction.  60 cc of Marcaine with epinephrine was injected through the arthroscope.  The portals were closed with 3-0 nylon suture.  A sterile bandage, Ace wrap and Cryo/Cuff was placed and the Cryo/Cuff was activated. The patient was taken to the recovery room in stable condition.  PHYSICIAN ASSISTANT: no  ASSISTANTS: none   ANESTHESIA:   General LMA  EBL:  none   BLOOD ADMINISTERED:none  DRAINS: none   LOCAL MEDICATIONS USED:  MARCAINE     SPECIMEN:  No Specimen  DISPOSITION OF SPECIMEN:  N/A  COUNTS:  YES   DICTATION: .Dragon Dictation  PLAN OF CARE: Discharge to home after PACU  PATIENT DISPOSITION:  PACU - hemodynamically stable.   Delay start of Pharmacological VTE agent (>24hrs) due to surgical blood loss or risk of bleeding: not applicable  POST OP PLAN  WB as tolerated SUTURES OUT IN A WEEK  AROM  BRACE determine postop patient has a subchondral fracture

## 2022-05-10 ENCOUNTER — Encounter (HOSPITAL_COMMUNITY): Payer: Self-pay | Admitting: Orthopedic Surgery

## 2022-05-17 ENCOUNTER — Encounter: Payer: Self-pay | Admitting: Orthopedic Surgery

## 2022-05-17 ENCOUNTER — Ambulatory Visit (INDEPENDENT_AMBULATORY_CARE_PROVIDER_SITE_OTHER): Payer: PPO | Admitting: Orthopedic Surgery

## 2022-05-17 DIAGNOSIS — Z9889 Other specified postprocedural states: Secondary | ICD-10-CM

## 2022-05-17 NOTE — Patient Instructions (Addendum)
Continue weightbearing as tolerated with your walker   Continue your home exercises  Take 500 mg of Tylenol every 6 hours and ibuprofen 800 mg every 8 hours for pain control  Stop taking opioids

## 2022-05-17 NOTE — Progress Notes (Signed)
FOLLOW UP   Encounter Diagnosis  Name Primary?   S/P right knee arthroscopy 05/09/22 Yes     Chief Complaint  Patient presents with   Post-op Follow-up    Right Knee arthroscopy 05/09/22     Courtney Orozco is here today postop visit #1 operative findings meniscus medially normal diffuse grade II chondromalacia  Lateral torn lateral meniscus anterior horn and mid body and posterior horn with articular surface grade 3 diffuse chondromalacia  Patellofemoral joint grade II chondromalacia  Notch ACL PCL normal  She had a partial lateral meniscectomy  Plan weight-bear as tolerated  Continue ice 3 times a day  Start home exercises  Follow-up in 3 weeks  Transition to Tylenol and ibuprofen for pain control

## 2022-06-08 ENCOUNTER — Encounter: Payer: Self-pay | Admitting: Orthopedic Surgery

## 2022-06-08 ENCOUNTER — Ambulatory Visit (INDEPENDENT_AMBULATORY_CARE_PROVIDER_SITE_OTHER): Payer: PPO | Admitting: Orthopedic Surgery

## 2022-06-08 VITALS — Ht 63.0 in | Wt 199.0 lb

## 2022-06-08 DIAGNOSIS — Z9889 Other specified postprocedural states: Secondary | ICD-10-CM

## 2022-06-08 NOTE — Progress Notes (Signed)
FOLLOW UP   Encounter Diagnosis  Name Primary?   S/P right knee arthroscopy 05/09/22 Yes     Chief Complaint  Patient presents with   Routine Post Op    Rt knee DOS 05/09/22    Courtney Orozco is progressing slowly.  She still has severe pain at times in the front of the knee radiating to the front of the tibia she also has some posterior pain and still has some mild swelling in the joint.  Her active motion is 5-90 her passive motion is 115 degrees and this causes some pain  She did have that patella fracture and she does have some arthritis in the joint with diffuse grade III chondromalacia on the lateral side as well as grade 2 on the patellofemoral joint again along with the fracture medially and grade II chondromalacia as well  Recommend she continue the walker Home exercises ice occasional hydrocodone as needed and return in 4 weeks and discuss her situation again

## 2022-07-12 ENCOUNTER — Other Ambulatory Visit: Payer: Self-pay | Admitting: Urology

## 2022-07-13 ENCOUNTER — Ambulatory Visit (INDEPENDENT_AMBULATORY_CARE_PROVIDER_SITE_OTHER): Payer: PPO | Admitting: Orthopedic Surgery

## 2022-07-13 ENCOUNTER — Encounter: Payer: Self-pay | Admitting: Orthopedic Surgery

## 2022-07-13 DIAGNOSIS — Z9889 Other specified postprocedural states: Secondary | ICD-10-CM

## 2022-07-13 MED ORDER — CYCLOBENZAPRINE HCL 5 MG PO TABS: 5.0000 mg | ORAL_TABLET | Freq: Three times a day (TID) | ORAL | 5 refills | Status: DC | PRN

## 2022-07-13 NOTE — Progress Notes (Signed)
POST OP VISIT:  Encounter Diagnosis  Name Primary?   S/P right knee arthroscopy 05/09/22 Yes     Chief Complaint  Patient presents with   Post-op Follow-up    Right knee 05/09/22   Allergies  Allergen Reactions   Ciprofloxacin Hives and Other (See Comments)    Can take levaquin with no problems per patient and family   Sulfa Antibiotics Itching   Current Outpatient Medications  Medication Instructions   albuterol (VENTOLIN HFA) 108 (90 Base) MCG/ACT inhaler 2 puffs, Inhalation, Every 4 hours PRN   aspirin-acetaminophen-caffeine (EXCEDRIN MIGRAINE) 250-250-65 MG tablet 2 tablets, Oral, Every 6 hours PRN   aspirin 325 mg, Oral, Daily   citalopram (CELEXA) 10 mg, Oral, Daily at bedtime   diazepam (VALIUM) 5 mg, Oral, Every 4 hours PRN   diphenhydrAMINE (BENADRYL) 25 mg, Oral, Daily PRN   gabapentin (NEURONTIN) 200 mg, Oral, Daily at bedtime   HYDROcodone-acetaminophen (NORCO/VICODIN) 5-325 MG tablet 1 tablet, Oral, Every 6 hours PRN   levothyroxine (SYNTHROID) 175 mcg, Oral, Daily before breakfast   meclizine (DRAMAMINE) 25 mg, Oral, 3 times daily PRN   Melatonin 10 mg, Oral, Daily at bedtime   metoprolol tartrate (LOPRESSOR) 25 mg, Oral, Daily   nitrofurantoin (MACRODANTIN) 50 mg, Oral, Daily   Polyethylene Glycol 3350 (MIRALAX PO) 1 Dose, Oral, Daily, 1 dose = 3 tablespoons    torsemide (DEMADEX) 10 mg, Oral, Daily   Courtney Orozco is getting better.  She can bend and straighten her knee well now.  She has some medial joint line pain.  We placed her in a knee brace to help with that.  She does not want to have knee replacement surgery.  She will come back in 6 weeks after wearing the brace to see if this helped.

## 2022-08-23 ENCOUNTER — Telehealth: Payer: Self-pay | Admitting: Orthopedic Surgery

## 2022-08-23 NOTE — Telephone Encounter (Signed)
Patient was scheduled on Dr. Luna Glasgow schedule and is suppose to be on Dr. Aline Brochure schedule.  I called the patient and left voicemail to call our office to R/S her appt.

## 2022-08-24 ENCOUNTER — Ambulatory Visit: Payer: PPO | Admitting: Orthopedic Surgery

## 2022-08-24 ENCOUNTER — Encounter: Payer: Self-pay | Admitting: Orthopedic Surgery

## 2022-08-24 ENCOUNTER — Ambulatory Visit: Payer: PPO | Admitting: Orthopaedic Surgery

## 2022-08-24 VITALS — Ht 63.0 in | Wt 199.0 lb

## 2022-08-24 DIAGNOSIS — Z9889 Other specified postprocedural states: Secondary | ICD-10-CM

## 2022-08-24 DIAGNOSIS — M1711 Unilateral primary osteoarthritis, right knee: Secondary | ICD-10-CM | POA: Diagnosis not present

## 2022-08-24 DIAGNOSIS — M1710 Unilateral primary osteoarthritis, unspecified knee: Secondary | ICD-10-CM

## 2022-08-24 NOTE — Progress Notes (Signed)
FOLLOW UP   Encounter Diagnoses  Name Primary?   S/P right knee arthroscopy 05/09/22 Yes   Primary localized osteoarthritis of knee-RIGHT KNEE      Chief Complaint  Patient presents with   Routine Post Op    S/P R knee scope DOS 05/09/22    Courtney Orozco 71 years old we did an arthroscopy of her right knee on May 09, 2022.  She was still having some medial knee pain and she was placed in a brace.  She was adamant about not having any further surgical intervention.  Pain is having pain and could not wear the brace but she says she can stand it right now.  She has a walker she has a cane she has a scooter her  She says she does not want to have knee surgery and she can take the pain right now if it worsens she will call us back  She was independently ambulatory today without a limp the knee was not swollen and she had flexion up to 115 degrees   Operative findings   MEDIAL - meniscus normal -articular surface grade 2 diffuse chondromalacia   LATERAL - meniscus torn lateral meniscus anterior horn mid body and posterior horn -articular surface grade 3 diffuse chondromalacia   PTF   -articular surface grade II chondromalacia  Encounter Diagnoses  Name Primary?   S/P right knee arthroscopy 05/09/22 Yes   Primary localized osteoarthritis of knee-RIGHT KNEE    Follow-up in 6 months for x-ray right knee

## 2022-08-24 NOTE — Patient Instructions (Signed)
USE THE CANE AND WALKER

## 2022-09-27 ENCOUNTER — Ambulatory Visit: Payer: PPO | Admitting: Urology

## 2022-10-03 ENCOUNTER — Telehealth: Payer: Self-pay | Admitting: Orthopedic Surgery

## 2022-10-03 NOTE — Telephone Encounter (Signed)
Patient called, lvm that she fell this morning, she thought that she'd be ok.  She's having pain running from her knee to her hip, she can put a little pressure on her leg, but not much.  She is wanting to be seen tomorrow.  She stated she has seen Dr. Aline Brochure and Dr. Luna Glasgow before.  Pt's # [id number].  Please advise.

## 2022-10-04 NOTE — Telephone Encounter (Signed)
I called the patient and asked her to come to the office today and to be here at 11:45am, she stated that she wasn't coming because she can now put weight on it.  I advised her to call us if she needs [place].

## 2022-10-09 ENCOUNTER — Inpatient Hospital Stay (HOSPITAL_COMMUNITY)
Admission: EM | Admit: 2022-10-09 | Discharge: 2022-10-12 | DRG: 481 | Disposition: A | Discharge status: Home Health | Payer: PPO | Attending: Family Medicine | Admitting: Family Medicine

## 2022-10-09 ENCOUNTER — Encounter (HOSPITAL_COMMUNITY): Payer: Self-pay

## 2022-10-09 ENCOUNTER — Encounter: Payer: Self-pay | Admitting: Orthopedic Surgery

## 2022-10-09 ENCOUNTER — Ambulatory Visit (INDEPENDENT_AMBULATORY_CARE_PROVIDER_SITE_OTHER): Payer: PPO | Admitting: Orthopedic Surgery

## 2022-10-09 ENCOUNTER — Emergency Department (HOSPITAL_COMMUNITY): Payer: PPO

## 2022-10-09 DIAGNOSIS — S72001A Fracture of unspecified part of neck of right femur, initial encounter for closed fracture: Principal | ICD-10-CM

## 2022-10-09 DIAGNOSIS — F32A Depression, unspecified: Secondary | ICD-10-CM | POA: Diagnosis present

## 2022-10-09 DIAGNOSIS — Z79890 Hormone replacement therapy (postmenopausal): Secondary | ICD-10-CM

## 2022-10-09 DIAGNOSIS — F17210 Nicotine dependence, cigarettes, uncomplicated: Secondary | ICD-10-CM | POA: Diagnosis present

## 2022-10-09 DIAGNOSIS — Z87442 Personal history of urinary calculi: Secondary | ICD-10-CM

## 2022-10-09 DIAGNOSIS — E119 Type 2 diabetes mellitus without complications: Secondary | ICD-10-CM

## 2022-10-09 DIAGNOSIS — M25551 Pain in right hip: Secondary | ICD-10-CM

## 2022-10-09 DIAGNOSIS — K219 Gastro-esophageal reflux disease without esophagitis: Secondary | ICD-10-CM | POA: Diagnosis present

## 2022-10-09 DIAGNOSIS — D72829 Elevated white blood cell count, unspecified: Secondary | ICD-10-CM | POA: Diagnosis present

## 2022-10-09 DIAGNOSIS — Z961 Presence of intraocular lens: Secondary | ICD-10-CM | POA: Diagnosis present

## 2022-10-09 DIAGNOSIS — Z825 Family history of asthma and other chronic lower respiratory diseases: Secondary | ICD-10-CM

## 2022-10-09 DIAGNOSIS — Z90710 Acquired absence of both cervix and uterus: Secondary | ICD-10-CM

## 2022-10-09 DIAGNOSIS — Z8619 Personal history of other infectious and parasitic diseases: Secondary | ICD-10-CM

## 2022-10-09 DIAGNOSIS — Z881 Allergy status to other antibiotic agents status: Secondary | ICD-10-CM

## 2022-10-09 DIAGNOSIS — S72141A Displaced intertrochanteric fracture of right femur, initial encounter for closed fracture: Principal | ICD-10-CM | POA: Diagnosis present

## 2022-10-09 DIAGNOSIS — N183 Chronic kidney disease, stage 3 unspecified: Secondary | ICD-10-CM | POA: Diagnosis present

## 2022-10-09 DIAGNOSIS — Z9842 Cataract extraction status, left eye: Secondary | ICD-10-CM

## 2022-10-09 DIAGNOSIS — J9610 Chronic respiratory failure, unspecified whether with hypoxia or hypercapnia: Secondary | ICD-10-CM | POA: Diagnosis present

## 2022-10-09 DIAGNOSIS — Z882 Allergy status to sulfonamides status: Secondary | ICD-10-CM

## 2022-10-09 DIAGNOSIS — W1830XA Fall on same level, unspecified, initial encounter: Secondary | ICD-10-CM | POA: Diagnosis present

## 2022-10-09 DIAGNOSIS — E1122 Type 2 diabetes mellitus with diabetic chronic kidney disease: Secondary | ICD-10-CM | POA: Diagnosis present

## 2022-10-09 DIAGNOSIS — Z79899 Other long term (current) drug therapy: Secondary | ICD-10-CM

## 2022-10-09 DIAGNOSIS — E1140 Type 2 diabetes mellitus with diabetic neuropathy, unspecified: Secondary | ICD-10-CM | POA: Diagnosis present

## 2022-10-09 DIAGNOSIS — E039 Hypothyroidism, unspecified: Secondary | ICD-10-CM | POA: Diagnosis present

## 2022-10-09 DIAGNOSIS — K5730 Diverticulosis of large intestine without perforation or abscess without bleeding: Secondary | ICD-10-CM | POA: Diagnosis present

## 2022-10-09 DIAGNOSIS — Z8673 Personal history of transient ischemic attack (TIA), and cerebral infarction without residual deficits: Secondary | ICD-10-CM

## 2022-10-09 DIAGNOSIS — Z9049 Acquired absence of other specified parts of digestive tract: Secondary | ICD-10-CM

## 2022-10-09 DIAGNOSIS — W19XXXA Unspecified fall, initial encounter: Secondary | ICD-10-CM

## 2022-10-09 DIAGNOSIS — I129 Hypertensive chronic kidney disease with stage 1 through stage 4 chronic kidney disease, or unspecified chronic kidney disease: Secondary | ICD-10-CM | POA: Diagnosis present

## 2022-10-09 DIAGNOSIS — F419 Anxiety disorder, unspecified: Secondary | ICD-10-CM | POA: Diagnosis present

## 2022-10-09 DIAGNOSIS — Z9841 Cataract extraction status, right eye: Secondary | ICD-10-CM

## 2022-10-09 DIAGNOSIS — Z7982 Long term (current) use of aspirin: Secondary | ICD-10-CM

## 2022-10-09 LAB — CBC WITH DIFFERENTIAL/PLATELET
Abs Immature Granulocytes: 0.08 10*3/uL — ABNORMAL HIGH (ref 0.00–0.07)
Basophils Absolute: 0.1 10*3/uL (ref 0.0–0.1)
Basophils Relative: 0 %
Eosinophils Absolute: 0.1 10*3/uL (ref 0.0–0.5)
Eosinophils Relative: 1 %
HCT: 44.3 % (ref 36.0–46.0)
Hemoglobin: 14.5 g/dL (ref 12.0–15.0)
Immature Granulocytes: 1 %
Lymphocytes Relative: 22 %
Lymphs Abs: 3 10*3/uL (ref 0.7–4.0)
MCH: 30.9 pg (ref 26.0–34.0)
MCHC: 32.7 g/dL (ref 30.0–36.0)
MCV: 94.3 fL (ref 80.0–100.0)
Monocytes Absolute: 0.7 10*3/uL (ref 0.1–1.0)
Monocytes Relative: 5 %
Neutro Abs: 9.9 10*3/uL — ABNORMAL HIGH (ref 1.7–7.7)
Neutrophils Relative %: 71 %
Platelets: 331 10*3/uL (ref 150–400)
RBC: 4.7 MIL/uL (ref 3.87–5.11)
RDW: 13.3 % (ref 11.5–15.5)
WBC: 13.8 10*3/uL — ABNORMAL HIGH (ref 4.0–10.5)
nRBC: 0 % (ref 0.0–0.2)

## 2022-10-09 LAB — BASIC METABOLIC PANEL
Anion gap: 8 (ref 5–15)
BUN: 17 mg/dL (ref 8–23)
CO2: 26 mmol/L (ref 22–32)
Calcium: 9.3 mg/dL (ref 8.9–10.3)
Chloride: 101 mmol/L (ref 98–111)
Creatinine, Ser: 0.81 mg/dL (ref 0.44–1.00)
GFR, Estimated: >60 mL/min (ref >60)
Glucose, Bld: 129 mg/dL — ABNORMAL HIGH (ref 70–99)
Potassium: 4.2 mmol/L (ref 3.5–5.1)
Sodium: 135 mmol/L (ref 135–145)

## 2022-10-09 LAB — TYPE AND SCREEN
ABO/RH(D): O POS
Antibody Screen: NEGATIVE

## 2022-10-09 LAB — PROTIME-INR
INR: 1 (ref 0.8–1.2)
Prothrombin Time: 13.4 seconds (ref 11.4–15.2)

## 2022-10-09 MED ORDER — MORPHINE SULFATE (PF) 4 MG/ML IV SOLN
4.0000 mg | Freq: Once | INTRAVENOUS | Status: AC
  Administered 2022-10-09: 4 mg via INTRAVENOUS
  Filled 2022-10-09: qty 1

## 2022-10-09 NOTE — ED Triage Notes (Signed)
Pt referred to ED from orthopedic office for fall approximately one week ago. Pt c/o R hip pain. States she hit her head on a stool, no LOC, no blood thinners.

## 2022-10-09 NOTE — ED Provider Notes (Signed)
[facility] EMERGENCY DEPARTMENT Provider Note   CSN: [id number] Arrival date & time: 10/09/22  1630     History {Add pertinent medical, surgical, social history, OB history to HPI:1} Chief Complaint  Patient presents with   Courtney Orozco is a 71 y.o. female.  He is complaining of severe right hip pain and some numbness in her right foot that started about a week ago when she fell.  She said she got double pneumonia from an RSV shot and she has been fairly weak.  She tried to stand up a week ago and her knee buckled on her and she fell and hit her hip.  Since then she has had painful ambulation on that side.  Symptoms acutely worsened yesterday when she felt a pop.  Has not been able to ambulate since then.  Went to Dr. Ruthe Mannan office today but he was unable to x-ray due to her inability to get up on the table.  She denies any head or neck pain.  The history is provided by the patient.  Hip Pain This is a new problem. The current episode started more than 1 week ago. The problem occurs constantly. The problem has been rapidly worsening. Pertinent negatives include no chest pain, no abdominal pain, no headaches and no shortness of breath. The symptoms are aggravated by walking. Nothing relieves the symptoms. She has tried rest for the symptoms. The treatment provided no relief.       Home Medications Prior to Admission medications   Medication Sig Start Date End Date Taking? Authorizing Provider  albuterol (VENTOLIN HFA) 108 (90 Base) MCG/ACT inhaler Inhale 2 puffs into the lungs every 4 (four) hours as needed for shortness of breath or wheezing. 08/26/19   [provider]  aspirin 325 MG tablet Take 325 mg by mouth daily.    [provider]  aspirin-acetaminophen-caffeine (EXCEDRIN MIGRAINE) [phone] MG tablet Take 2 tablets by mouth every 6 (six) hours as needed for headache.    [provider]  citalopram (CELEXA) 10 MG tablet Take 10 mg by  mouth at bedtime.    [provider]  cyclobenzaprine (FLEXERIL) 5 MG tablet Take 1 tablet (5 mg total) by mouth 3 (three) times daily as needed for muscle spasms. 07/13/22   Carole Civil, MD  diazepam (VALIUM) 5 MG tablet Take 5 mg by mouth every 4 (four) hours as needed for anxiety. 02/04/20   [provider]  diphenhydrAMINE (BENADRYL) 25 MG tablet Take 25 mg by mouth daily as needed for allergies.    [provider]  gabapentin (NEURONTIN) 100 MG capsule Take 200 mg by mouth at bedtime. 02/16/20   [provider]  HYDROcodone-acetaminophen (NORCO/VICODIN) 5-325 MG tablet Take 1 tablet by mouth every 6 (six) hours as needed for moderate pain. 02/04/20   [provider]  levothyroxine (SYNTHROID) 175 MCG tablet Take 175 mcg by mouth daily before breakfast. 06/28/20   [provider]  meclizine (DRAMAMINE) 25 MG tablet Take 25 mg by mouth 3 (three) times daily as needed for nausea.    [provider]  Melatonin 10 MG TABS Take 10 mg by mouth at bedtime.    [provider]  metoprolol tartrate (LOPRESSOR) 25 MG tablet Take 25 mg by mouth daily. 06/28/20   [provider]  nitrofurantoin (MACRODANTIN) 50 MG capsule TAKE ONE CAPSULE BY MOUTH AT BEDTIME 07/19/22   McKenzie, Candee Furbish, MD  Polyethylene Glycol 3350 (MIRALAX PO) Take  1 Dose by mouth daily. 1 dose = 3 tablespoons    [provider]  torsemide (DEMADEX) 10 MG tablet Take 10 mg by mouth daily. 01/16/20   [provider]      Allergies    Ciprofloxacin and Sulfa antibiotics    Review of Systems   Review of Systems  Constitutional:  Negative for fever.  Respiratory:  Negative for shortness of breath.   Cardiovascular:  Negative for chest pain.  Gastrointestinal:  Negative for abdominal pain.  Musculoskeletal:  Positive for back pain and gait problem.  Neurological:  Positive for numbness. Negative for headaches.    Physical Exam Updated  Vital Signs BP (!) 140/85 (BP Location: Right Arm)   Pulse 86   Temp 97.8 F (36.6 C) (Oral)   Resp 16   Ht '5\' 3"'$  (1.6 m)   Wt 82.6 kg   SpO2 92%   BMI 32.24 kg/m  Physical Exam Vitals and nursing note reviewed.  Constitutional:      General: She is not in acute distress.    Appearance: Normal appearance. She is well-developed.  HENT:     Head: Normocephalic and atraumatic.  Eyes:     Conjunctiva/sclera: Conjunctivae normal.  Cardiovascular:     Rate and Rhythm: Normal rate and regular rhythm.     Heart sounds: No murmur heard. Pulmonary:     Effort: Pulmonary effort is normal. No respiratory distress.     Breath sounds: Normal breath sounds.  Abdominal:     Palpations: Abdomen is soft.     Tenderness: There is no abdominal tenderness. There is no guarding or rebound.  Musculoskeletal:        General: Tenderness present.     Cervical back: Neck supple.     Comments: Significant tenderness all around her right hip.  There is no significant shortening or rotation.  Knee and ankle nontender.  Skin:    General: Skin is warm and dry.     Capillary Refill: Capillary refill takes less than 2 seconds.  Neurological:     General: No focal deficit present.     Mental Status: She is alert.     Motor: No weakness.     ED Results / Procedures / Treatments   Labs (all labs ordered are listed, but only abnormal results are displayed) Labs Reviewed - No data to display  EKG None  Radiology DG Hip Unilat  With Pelvis 2-3 Views Right  Result Date: 10/09/2022 CLINICAL DATA:  Right hip pain with difficulty bearing weight after falling 1 week ago. EXAM: DG HIP (WITH OR WITHOUT PELVIS) 2-3V RIGHT COMPARISON:  Pelvic and right hip radiographs 01/22/2017. Abdominopelvic CT 07/31/2018. FINDINGS: The bones appear demineralized. There is suboptimal evaluation of the intertrochanteric region of the right femur due to external rotation on all views. This makes exclusion of an  intertrochanteric fracture difficult. The femoral neck appears intact. There is no dislocation or femoral head osteonecrosis. Degenerative changes are present in the lower lumbar spine and both hips. There are postsurgical changes at the symphysis pubis. IMPRESSION: Suboptimal evaluation of the intertrochanteric region of the right femur due to external rotation on all views. Recommend further evaluation with right hip CT. Electronically Signed   By: Richardean Sale M.D.   On: 10/09/2022 17:52    Procedures Procedures  {Document cardiac monitor, telemetry assessment procedure when appropriate:1}  Medications Ordered in ED Medications  morphine (PF) 4 MG/ML injection 4 mg (has no administration in time range)  ED Course/ Medical Decision Making/ A&P                           Medical Decision Making Amount and/or Complexity of Data Reviewed Labs: ordered. Radiology: ordered.  Risk Prescription drug management.   This patient complains of ***; this involves an extensive number of treatment Options and is a complaint that carries with it a high risk of complications and morbidity. The differential includes ***  I ordered, reviewed and interpreted labs, which included *** I ordered medication *** and reviewed PMP when indicated. I ordered imaging studies which included *** and I independently    visualized and interpreted imaging which showed *** Additional history obtained from *** Previous records obtained and reviewed *** I consulted *** and discussed lab and imaging findings and discussed disposition.  Cardiac monitoring reviewed, *** Social determinants considered, *** Critical Interventions: ***  After the interventions stated above, I reevaluated the patient and found *** Admission and further testing considered, ***   {Document critical care time when appropriate:1} {Document review of labs and clinical decision tools ie heart score, Chads2Vasc2 etc:1}  {Document your  independent review of radiology images, and any outside records:1} {Document your discussion with family members, caretakers, and with consultants:1} {Document social determinants of health affecting pt's care:1} {Document your decision making why or why not admission, treatments were needed:1} Final Clinical Impression(s) / ED Diagnoses Final diagnoses:  None    Rx / DC Orders ED Discharge Orders     None

## 2022-10-09 NOTE — Progress Notes (Signed)
Chief Complaint  Patient presents with   Knee Pain    RT knee/hip//patient fell on 10/02/22 landing on her right side. Unable to walk. Pain radiates from hip down to foot Hx of rt knee scope 05/09/22    71 year old female with only recovered from bilateral pneumonia slipped when her right knee buckled landed on her right side call the office that she could weight-bear however when questioning her today she says that she really could not weight-bear and was using a walker and then on Sunday something else popped and she had increased pain since she took 2 pain pills today could not get pain relief and came to the office  We were unable to get an x-ray on her because she could not get up and we could not get her up to get on the x-ray table we sent her immediately to the emergency room for evaluation for right hip fracture or pelvic fracture  She has multiple medical problems including GERD hypothyroidism diabetes chronic back pain grade 3 CKD hypertension

## 2022-10-10 ENCOUNTER — Encounter (HOSPITAL_COMMUNITY): Payer: Self-pay | Admitting: Internal Medicine

## 2022-10-10 ENCOUNTER — Other Ambulatory Visit: Payer: Self-pay

## 2022-10-10 DIAGNOSIS — I10 Essential (primary) hypertension: Secondary | ICD-10-CM | POA: Diagnosis not present

## 2022-10-10 DIAGNOSIS — E1140 Type 2 diabetes mellitus with diabetic neuropathy, unspecified: Secondary | ICD-10-CM | POA: Diagnosis present

## 2022-10-10 DIAGNOSIS — J9610 Chronic respiratory failure, unspecified whether with hypoxia or hypercapnia: Secondary | ICD-10-CM | POA: Diagnosis present

## 2022-10-10 DIAGNOSIS — Z79890 Hormone replacement therapy (postmenopausal): Secondary | ICD-10-CM | POA: Diagnosis not present

## 2022-10-10 DIAGNOSIS — E1122 Type 2 diabetes mellitus with diabetic chronic kidney disease: Secondary | ICD-10-CM | POA: Diagnosis present

## 2022-10-10 DIAGNOSIS — K219 Gastro-esophageal reflux disease without esophagitis: Secondary | ICD-10-CM | POA: Diagnosis present

## 2022-10-10 DIAGNOSIS — S72141A Displaced intertrochanteric fracture of right femur, initial encounter for closed fracture: Secondary | ICD-10-CM

## 2022-10-10 DIAGNOSIS — Z882 Allergy status to sulfonamides status: Secondary | ICD-10-CM | POA: Diagnosis not present

## 2022-10-10 DIAGNOSIS — Z881 Allergy status to other antibiotic agents status: Secondary | ICD-10-CM | POA: Diagnosis not present

## 2022-10-10 DIAGNOSIS — Z90710 Acquired absence of both cervix and uterus: Secondary | ICD-10-CM | POA: Diagnosis not present

## 2022-10-10 DIAGNOSIS — J189 Pneumonia, unspecified organism: Secondary | ICD-10-CM | POA: Diagnosis not present

## 2022-10-10 DIAGNOSIS — S72141D Displaced intertrochanteric fracture of right femur, subsequent encounter for closed fracture with routine healing: Secondary | ICD-10-CM | POA: Diagnosis not present

## 2022-10-10 DIAGNOSIS — Z825 Family history of asthma and other chronic lower respiratory diseases: Secondary | ICD-10-CM | POA: Diagnosis not present

## 2022-10-10 DIAGNOSIS — Z8673 Personal history of transient ischemic attack (TIA), and cerebral infarction without residual deficits: Secondary | ICD-10-CM | POA: Diagnosis not present

## 2022-10-10 DIAGNOSIS — Z961 Presence of intraocular lens: Secondary | ICD-10-CM | POA: Diagnosis present

## 2022-10-10 DIAGNOSIS — N183 Chronic kidney disease, stage 3 unspecified: Secondary | ICD-10-CM | POA: Diagnosis present

## 2022-10-10 DIAGNOSIS — D72829 Elevated white blood cell count, unspecified: Secondary | ICD-10-CM | POA: Diagnosis not present

## 2022-10-10 DIAGNOSIS — I129 Hypertensive chronic kidney disease with stage 1 through stage 4 chronic kidney disease, or unspecified chronic kidney disease: Secondary | ICD-10-CM | POA: Diagnosis present

## 2022-10-10 DIAGNOSIS — N1832 Chronic kidney disease, stage 3b: Secondary | ICD-10-CM

## 2022-10-10 DIAGNOSIS — Z9842 Cataract extraction status, left eye: Secondary | ICD-10-CM | POA: Diagnosis not present

## 2022-10-10 DIAGNOSIS — E039 Hypothyroidism, unspecified: Secondary | ICD-10-CM | POA: Diagnosis present

## 2022-10-10 DIAGNOSIS — Z87442 Personal history of urinary calculi: Secondary | ICD-10-CM | POA: Diagnosis not present

## 2022-10-10 DIAGNOSIS — F17210 Nicotine dependence, cigarettes, uncomplicated: Secondary | ICD-10-CM | POA: Diagnosis present

## 2022-10-10 DIAGNOSIS — F32A Depression, unspecified: Secondary | ICD-10-CM | POA: Diagnosis present

## 2022-10-10 DIAGNOSIS — K5730 Diverticulosis of large intestine without perforation or abscess without bleeding: Secondary | ICD-10-CM | POA: Diagnosis present

## 2022-10-10 DIAGNOSIS — Z9841 Cataract extraction status, right eye: Secondary | ICD-10-CM | POA: Diagnosis not present

## 2022-10-10 DIAGNOSIS — Z7982 Long term (current) use of aspirin: Secondary | ICD-10-CM | POA: Diagnosis not present

## 2022-10-10 DIAGNOSIS — Z79899 Other long term (current) drug therapy: Secondary | ICD-10-CM | POA: Diagnosis not present

## 2022-10-10 DIAGNOSIS — Z9049 Acquired absence of other specified parts of digestive tract: Secondary | ICD-10-CM | POA: Diagnosis not present

## 2022-10-10 DIAGNOSIS — W1830XA Fall on same level, unspecified, initial encounter: Secondary | ICD-10-CM | POA: Diagnosis present

## 2022-10-10 LAB — URINALYSIS, ROUTINE W REFLEX MICROSCOPIC
Bilirubin Urine: NEGATIVE
Glucose, UA: NEGATIVE mg/dL
Hgb urine dipstick: NEGATIVE
Ketones, ur: NEGATIVE mg/dL
Leukocytes,Ua: NEGATIVE
Nitrite: NEGATIVE
Protein, ur: NEGATIVE mg/dL
Specific Gravity, Urine: 1.014 (ref 1.005–1.030)
pH: 5 (ref 5.0–8.0)

## 2022-10-10 LAB — CBC
HCT: 44 % (ref 36.0–46.0)
Hemoglobin: 14.3 g/dL (ref 12.0–15.0)
MCH: 31.3 pg (ref 26.0–34.0)
MCHC: 32.5 g/dL (ref 30.0–36.0)
MCV: 96.3 fL (ref 80.0–100.0)
Platelets: 291 10*3/uL (ref 150–400)
RBC: 4.57 MIL/uL (ref 3.87–5.11)
RDW: 13.5 % (ref 11.5–15.5)
WBC: 9.2 10*3/uL (ref 4.0–10.5)
nRBC: 0 % (ref 0.0–0.2)

## 2022-10-10 LAB — CBG MONITORING, ED
Glucose-Capillary: 129 mg/dL — ABNORMAL HIGH (ref 70–99)
Glucose-Capillary: 133 mg/dL — ABNORMAL HIGH (ref 70–99)
Glucose-Capillary: 146 mg/dL — ABNORMAL HIGH (ref 70–99)
Glucose-Capillary: 105 mg/dL — ABNORMAL HIGH (ref 70–99)
Glucose-Capillary: 104 mg/dL — ABNORMAL HIGH (ref 70–99)

## 2022-10-10 LAB — BASIC METABOLIC PANEL
Anion gap: 8 (ref 5–15)
BUN: 12 mg/dL (ref 8–23)
CO2: 27 mmol/L (ref 22–32)
Calcium: 9 mg/dL (ref 8.9–10.3)
Chloride: 103 mmol/L (ref 98–111)
Creatinine, Ser: 0.63 mg/dL (ref 0.44–1.00)
GFR, Estimated: >60 mL/min (ref >60)
Glucose, Bld: 120 mg/dL — ABNORMAL HIGH (ref 70–99)
Potassium: 4 mmol/L (ref 3.5–5.1)
Sodium: 138 mmol/L (ref 135–145)

## 2022-10-10 MED ORDER — MORPHINE SULFATE (PF) 2 MG/ML IV SOLN
1.0000 mg | INTRAVENOUS | Status: DC | PRN
  Administered 2022-10-10 – 2022-10-11 (×6): 1 mg via INTRAVENOUS
  Filled 2022-10-10 (×6): qty 1

## 2022-10-10 MED ORDER — SODIUM CHLORIDE 0.9 % IV SOLN: INTRAVENOUS | Status: AC

## 2022-10-10 MED ORDER — CEFAZOLIN SODIUM-DEXTROSE 2-4 GM/100ML-% IV SOLN
2.0000 g | INTRAVENOUS | Status: AC
  Administered 2022-10-11: 2 g via INTRAVENOUS
  Filled 2022-10-10: qty 100

## 2022-10-10 MED ORDER — ASPIRIN 325 MG PO TABS
325.0000 mg | ORAL_TABLET | Freq: Every day | ORAL | Status: DC
  Administered 2022-10-10: 325 mg via ORAL
  Filled 2022-10-10: qty 1

## 2022-10-10 MED ORDER — ONDANSETRON HCL 4 MG PO TABS: 4.0000 mg | ORAL_TABLET | Freq: Four times a day (QID) | ORAL | Status: DC | PRN

## 2022-10-10 MED ORDER — ASPIRIN 81 MG PO TBEC: 81.0000 mg | DELAYED_RELEASE_TABLET | Freq: Every day | ORAL | Status: DC

## 2022-10-10 MED ORDER — ALBUTEROL SULFATE HFA 108 (90 BASE) MCG/ACT IN AERS: 2.0000 | INHALATION_SPRAY | RESPIRATORY_TRACT | Status: DC | PRN

## 2022-10-10 MED ORDER — ONDANSETRON HCL 4 MG/2ML IJ SOLN
4.0000 mg | Freq: Four times a day (QID) | INTRAMUSCULAR | Status: DC | PRN
  Administered 2022-10-10 – 2022-10-11 (×2): 4 mg via INTRAVENOUS
  Filled 2022-10-10 (×2): qty 2

## 2022-10-10 MED ORDER — CITALOPRAM HYDROBROMIDE 20 MG PO TABS
10.0000 mg | ORAL_TABLET | Freq: Every day | ORAL | Status: DC
  Administered 2022-10-10 – 2022-10-11 (×2): 10 mg via ORAL
  Filled 2022-10-10 (×2): qty 1

## 2022-10-10 MED ORDER — KETOROLAC TROMETHAMINE 30 MG/ML IJ SOLN
30.0000 mg | Freq: Four times a day (QID) | INTRAMUSCULAR | Status: AC
  Administered 2022-10-10 – 2022-10-12 (×5): 30 mg via INTRAVENOUS
  Filled 2022-10-10 (×5): qty 1

## 2022-10-10 MED ORDER — LEVOTHYROXINE SODIUM 75 MCG PO TABS
175.0000 ug | ORAL_TABLET | Freq: Every day | ORAL | Status: DC
  Administered 2022-10-10 – 2022-10-12 (×2): 175 ug via ORAL
  Filled 2022-10-10: qty 4
  Filled 2022-10-10: qty 1

## 2022-10-10 MED ORDER — ACETAMINOPHEN 650 MG RE SUPP: 650.0000 mg | Freq: Four times a day (QID) | RECTAL | Status: DC | PRN

## 2022-10-10 MED ORDER — NICOTINE 21 MG/24HR TD PT24: 21.0000 mg | MEDICATED_PATCH | Freq: Every day | TRANSDERMAL | Status: DC | PRN

## 2022-10-10 MED ORDER — ACETAMINOPHEN 325 MG PO TABS: 650.0000 mg | ORAL_TABLET | Freq: Four times a day (QID) | ORAL | Status: DC | PRN

## 2022-10-10 MED ORDER — POLYETHYLENE GLYCOL 3350 17 G PO PACK: 17.0000 g | PACK | Freq: Every day | ORAL | Status: DC | PRN

## 2022-10-10 MED ORDER — BISACODYL 10 MG RE SUPP: 10.0000 mg | Freq: Every day | RECTAL | Status: DC | PRN

## 2022-10-10 MED ORDER — HYDROCODONE-ACETAMINOPHEN 5-325 MG PO TABS
1.0000 | ORAL_TABLET | Freq: Four times a day (QID) | ORAL | Status: DC | PRN
  Administered 2022-10-11 – 2022-10-12 (×2): 1 via ORAL
  Filled 2022-10-10 (×2): qty 1

## 2022-10-10 MED ORDER — INSULIN ASPART 100 UNIT/ML IJ SOLN
0.0000 [IU] | INTRAMUSCULAR | Status: DC
  Administered 2022-10-10 (×3): 1 [IU] via SUBCUTANEOUS
  Administered 2022-10-11: 2 [IU] via SUBCUTANEOUS
  Administered 2022-10-11: 1 [IU] via SUBCUTANEOUS
  Filled 2022-10-10 (×3): qty 1

## 2022-10-10 MED ORDER — TRANEXAMIC ACID-NACL 1000-0.7 MG/100ML-% IV SOLN
1000.0000 mg | INTRAVENOUS | Status: AC
  Administered 2022-10-11: 1000 mg via INTRAVENOUS
  Filled 2022-10-10: qty 100

## 2022-10-10 MED ORDER — ACETAMINOPHEN 500 MG PO TABS
1000.0000 mg | ORAL_TABLET | Freq: Three times a day (TID) | ORAL | Status: DC
  Administered 2022-10-10 – 2022-10-11 (×4): 1000 mg via ORAL
  Filled 2022-10-10 (×5): qty 2

## 2022-10-10 MED ORDER — ACETAMINOPHEN 650 MG RE SUPP: 650.0000 mg | Freq: Three times a day (TID) | RECTAL | Status: DC

## 2022-10-10 MED ORDER — METOPROLOL TARTRATE 25 MG PO TABS
25.0000 mg | ORAL_TABLET | Freq: Every day | ORAL | Status: DC
  Administered 2022-10-10 – 2022-10-12 (×2): 25 mg via ORAL
  Filled 2022-10-10 (×2): qty 1

## 2022-10-10 MED ORDER — GABAPENTIN 100 MG PO CAPS
200.0000 mg | ORAL_CAPSULE | Freq: Every day | ORAL | Status: DC
  Administered 2022-10-10 – 2022-10-11 (×2): 200 mg via ORAL
  Filled 2022-10-10 (×2): qty 2

## 2022-10-10 MED ORDER — LEVOTHYROXINE SODIUM 50 MCG PO TABS: 175.0000 ug | ORAL_TABLET | Freq: Every day | ORAL | Status: DC

## 2022-10-10 MED ORDER — ENOXAPARIN SODIUM 40 MG/0.4ML IJ SOSY
40.0000 mg | PREFILLED_SYRINGE | INTRAMUSCULAR | Status: DC
  Administered 2022-10-10: 40 mg via SUBCUTANEOUS
  Filled 2022-10-10: qty 0.4

## 2022-10-10 NOTE — H&P (Signed)
History and Physical    Patient: Courtney Orozco ZHG:[id number] DOB: May 20, 1951 DOA: 10/09/2022 DOS: the patient was seen and examined on 10/10/2022 PCP: Galen Manila, MD  Patient coming from: Home  Chief Complaint:  Fall, R hip pain Chief Complaint  Patient presents with   Fall   HPI: Courtney Orozco is a 71 y.o. female with medical history significant of hypertension Dm2 w CKD stage3, hx of TIA,  Hypothyroidism apparently fell 1 week ago, and then yesterday felt a pop over her right hip.  Pt had increase in pain in the right hip that was worse yesterday and couldn't walk and therefore presented to ED for evaluation.  Pt had CT pelvis => displaced intertrochanteric fracture of the right hip.     Review of Systems: negative for all 10 organ systems except for + above   Past Medical History:  Diagnosis Date   Anxiety    Arthritis    Back pain    Depression    Diabetes mellitus without complication (HCC)    no meds in 5 years    Gait instability    GERD (gastroesophageal reflux disease)    Headache    hx of migraines    Hepatitis    hx of hep B - 44 years ago    History of kidney stones    Hypertension    Hypothyroidism    Memory loss    Pneumonia    hx of 01/2015    PONV (postoperative nausea and vomiting)    pt states "the last two times ive been put to sleep, my oxygen drops low and they have trouble waking me up.   Thyroid disease    TIA (transient ischemic attack)    Past Surgical History:  Procedure Laterality Date   ABDOMINAL HYSTERECTOMY     APPENDECTOMY     BACK SURGERY     x 4   bladder tack surgery      BREAST SURGERY Right    removal of 3 benign tumors   CATARACT EXTRACTION W/PHACO Left 04/30/2017   Procedure: CATARACT EXTRACTION PHACO AND INTRAOCULAR LENS PLACEMENT ([place]);  Surgeon: Tonny Branch, MD;  Location: [facility];  Service: Ophthalmology;  Laterality: Left;  CDE: 7.16   CATARACT EXTRACTION W/PHACO Right 05/14/2017   Procedure: CATARACT EXTRACTION  PHACO AND INTRAOCULAR LENS PLACEMENT RIGHT EYE;  Surgeon: Tonny Branch, MD;  Location: [facility];  Service: Ophthalmology;  Laterality: Right;  CDE: 6.63   CHOLECYSTECTOMY N/A 08/21/2018   Procedure: LAPAROSCOPIC CHOLECYSTECTOMY;  Surgeon: Virl Cagey, MD;  Location: [facility];  Service: General;  Laterality: N/A;   CYSTOSCOPY WITH STENT PLACEMENT Right 09/28/2017   Procedure: CYSTOSCOPY WITH STENT PLACEMENT;  Surgeon: Franchot Gallo, MD;  Location: [facility];  Service: Urology;  Laterality: Right;   CYSTOSCOPY WITH URETEROSCOPY, STONE BASKETRY AND STENT PLACEMENT Right 04/06/2015   Procedure: CYSTOSCOPY WITH RIGHT URETEROSCOPY, STONE BASKETRY AND STENT PLACEMENT;  Surgeon: Irine Seal, MD;  Location: [facility];  Service: Urology;  Laterality: Right;   CYSTOSCOPY/RETROGRADE/URETEROSCOPY Right 11/28/2017   Procedure: CYSTOSCOPY,  REMOVAL OF RIGHT URETERAL STENT, RIGHT RETROGRADE PYELOGRAM, RIGHT URETEROSCOPY, STONE BASKET EXTRACTION RIGHT URETERAL CALCULUS;  Surgeon: Cleon Gustin, MD;  Location: [facility];  Service: Urology;  Laterality: Right;   HOLMIUM LASER APPLICATION Right 1/96/2229   Procedure: HOLMIUM LASER APPLICATION;  Surgeon: Irine Seal, MD;  Location: [facility];  Service: Urology;  Laterality: Right;   KNEE ARTHROSCOPY WITH LATERAL MENISECTOMY Right 05/09/2022   Procedure:  KNEE ARTHROSCOPY WITH LATERAL MENISECTOMY;  Surgeon: Carole Civil, MD;  Location: [facility];  Service: Orthopedics;  Laterality: Right;   KNEE SURGERY     left   ROTATOR CUFF REPAIR Left    thumb surgery     left   Social History:  reports that she has been smoking cigarettes. She has a 25.00 pack-year smoking history. She has never used smokeless tobacco. She reports that she does not drink alcohol and does not use drugs.  Allergies  Allergen Reactions   Ciprofloxacin Hives and Other (See Comments)    Can take levaquin with no problems per patient and family   Sulfa Antibiotics Itching    Family History   Problem Relation Age of Onset   COPD Father     Prior to Admission medications   Medication Sig Start Date End Date Taking? Authorizing Provider  albuterol (VENTOLIN HFA) 108 (90 Base) MCG/ACT inhaler Inhale 2 puffs into the lungs every 4 (four) hours as needed for shortness of breath or wheezing. 08/26/19   [provider]  aspirin 325 MG tablet Take 325 mg by mouth daily.    [provider]  aspirin-acetaminophen-caffeine (EXCEDRIN MIGRAINE) [phone] MG tablet Take 2 tablets by mouth every 6 (six) hours as needed for headache.    [provider]  citalopram (CELEXA) 10 MG tablet Take 10 mg by mouth at bedtime.    [provider]  cyclobenzaprine (FLEXERIL) 5 MG tablet Take 1 tablet (5 mg total) by mouth 3 (three) times daily as needed for muscle spasms. 07/13/22   Carole Civil, MD  diazepam (VALIUM) 5 MG tablet Take 5 mg by mouth every 4 (four) hours as needed for anxiety. 02/04/20   [provider]  diphenhydrAMINE (BENADRYL) 25 MG tablet Take 25 mg by mouth daily as needed for allergies.    [provider]  gabapentin (NEURONTIN) 100 MG capsule Take 200 mg by mouth at bedtime. 02/16/20   [provider]  HYDROcodone-acetaminophen (NORCO/VICODIN) 5-325 MG tablet Take 1 tablet by mouth every 6 (six) hours as needed for moderate pain. 02/04/20   [provider]  levothyroxine (SYNTHROID) 175 MCG tablet Take 175 mcg by mouth daily before breakfast. 06/28/20   [provider]  meclizine (DRAMAMINE) 25 MG tablet Take 25 mg by mouth 3 (three) times daily as needed for nausea.    [provider]  Melatonin 10 MG TABS Take 10 mg by mouth at bedtime.    [provider]  metoprolol tartrate (LOPRESSOR) 25 MG tablet Take 25 mg by mouth daily. 06/28/20   [provider]  nitrofurantoin (MACRODANTIN) 50 MG capsule TAKE ONE CAPSULE BY MOUTH AT BEDTIME 07/19/22   McKenzie, Candee Furbish, MD  Polyethylene  Glycol 3350 (MIRALAX PO) Take 1 Dose by mouth daily. 1 dose = 3 tablespoons    [provider]  torsemide (DEMADEX) 10 MG tablet Take 10 mg by mouth daily. 01/16/20   [provider]    Physical Exam: Vitals:   10/09/22 2250 10/09/22 2300 10/09/22 2325 10/09/22 2330  BP: (!) 129/56 107/60  (!) 124/52  Pulse: 80 79 84 81  Resp:    18  Temp:      TempSrc:      SpO2: 92% (!) 87% 95% 91%  Weight:      Height:       Heent: anicteric, mm dry Neck: no jvd, no bruit Heart: rrr s1, s2, no m/g/r Lung: ctab Abd: soft, nt,  nd, +bs Ext: no c/c/e Msk: pain with movement of right leg  Data Reviewed:  Wbc 13.8, Hgb 14.5, Plt 331 Na 135, K 4.2, Bun 17, Creatinine 0.81 INR 1.0  Urinalysis pending   CT pelvis FINDINGS: Bones/Joint/Cartilage   Acute displaced greater trochanteric/intertrochanteric fracture (6:78). No other acute displaced fracture. No hip dislocation. No joint effusion.   Ligaments   Suboptimally assessed by CT.   Muscles and Tendons   Grossly unremarkable.   Soft tissues   No large hematoma formation. Subcutaneus soft tissue edema along the ischial tuberosity and lower gluteal soft tissues with associated calcifications. No organized fluid collection.   Other: Colonic diverticulosis.   IMPRESSION: Acute displaced greater trochanteric/intertrochanteric fracture.   Pt will be admitted for acute displaced R trochanteric fracture.     Assessment and Plan:  Displaced, R intertrochanteric fracture  Urinalysis NPO Pain control w iv medication (morphine) Zofran '4mg'$  iv q6h prn nausea Orthopedics consulted by ED, appreciate input  Dm2 w neuropathy Check hga1c Cont gabapentin '200mg'$  po qhs Fsbs q4, ISS  Hypertension Cont Metoprolol '25mg'$  po qday  Hx of TIA Cont aspirin  Hypothyroidism Cont Legvothyroxine 175 micrograms po qday  DVT prophylaxis: lovenox FULL CODE Dispo: home with PT (pt hopeful, doesn't want to go to  rehab)  Pt will be admitted for inpatient > 2 nites stay for displaced intertrochanteric femur fracture (right)   Advance Care Planning:   Code Status: Prior FULL CODE  Consults: orthopedics consulted by ED, appreciate input  Family Communication: husband present in room  Severity of Illness: The appropriate patient status for this patient is INPATIENT. Inpatient status is judged to be reasonable and necessary in order to provide the required intensity of service to ensure the patient's safety. The patient's presenting symptoms, physical exam findings, and initial radiographic and laboratory data in the context of their chronic comorbidities is felt to place them at high risk for further clinical deterioration. Furthermore, it is not anticipated that the patient will be medically stable for discharge from the hospital within 2 midnights of admission.   * I certify that at the point of admission it is my clinical judgment that the patient will require inpatient hospital care spanning beyond 2 midnights from the point of admission due to high intensity of service, high risk for further deterioration and high frequency of surveillance required.*  Author: Jani Gravel, MD 10/10/2022 12:28 AM  For on call review [email or web address].

## 2022-10-10 NOTE — ED Notes (Signed)
Bladder scanner revealed 856 of urine in bladder--DO made aware

## 2022-10-10 NOTE — Consult Note (Signed)
ORTHOPAEDIC CONSULTATION  REQUESTING PHYSICIAN: Heath Lark D, DO  ASSESSMENT AND PLAN: 71 y.o. female with the following: Right Hip Intertrochanteric femur fracture  This patient requires inpatient admission to the hospitalist, to include preoperative clearance and perioperative medical management  - Weight Bearing Status/Activity: NWB Right lower extremity  - Additional recommended labs/tests: Preop Labs: CBC, BMP, PT/INR, Chest XR, and EKG  -VTE Prophylaxis: Please hold prior to OR; to resume POD#1 at the discretion of the primary team  - Pain control: Recommend PO pain medications PRN; judicious use of narcotics  - Follow-up plan: F/u 10-14 days postop  -Procedures: Plan for OR once patient has been medically optimized  Plan for Right Hip Cephalomedullary nail     Chief Complaint: Right hip pain  HPI: Courtney Orozco is a 71 y.o. female who presented to the ED for evaluation after sustaining a mechanical fall.  She states that she fell approximately 1 week ago.  She was able to walk with some discomfort.  However, 1-2 days ago, she felt a pop in the right hip.  She did present to clinic yesterday to see Dr. Aline Brochure, and was subsequently sent to the ED for further evaluation.  Radiographs were inconclusive, but a CT scan has demonstrated a essentially nondisplaced fracture of the intertrochanteric region.  As a result, orthopedics was consulted.  She does have a history of 4 back surgeries.  She has pain in the right hip area.  She has had pain in the groin, that gets worse with any motion.  Past Medical History:  Diagnosis Date   Anxiety    Arthritis    Back pain    Depression    Diabetes mellitus without complication (HCC)    no meds in 5 years    Gait instability    GERD (gastroesophageal reflux disease)    Headache    hx of migraines    Hepatitis    hx of hep B - 44 years ago    History of kidney stones    Hypertension    Hypothyroidism    Memory loss     Pneumonia    hx of 01/2015    PONV (postoperative nausea and vomiting)    pt states "the last two times ive been put to sleep, my oxygen drops low and they have trouble waking me up.   Thyroid disease    TIA (transient ischemic attack)    Past Surgical History:  Procedure Laterality Date   ABDOMINAL HYSTERECTOMY     APPENDECTOMY     BACK SURGERY     x 4   bladder tack surgery      BREAST SURGERY Right    removal of 3 benign tumors   CATARACT EXTRACTION W/PHACO Left 04/30/2017   Procedure: CATARACT EXTRACTION PHACO AND INTRAOCULAR LENS PLACEMENT ([place]);  Surgeon: Tonny Branch, MD;  Location: [facility];  Service: Ophthalmology;  Laterality: Left;  CDE: 7.16   CATARACT EXTRACTION W/PHACO Right 05/14/2017   Procedure: CATARACT EXTRACTION PHACO AND INTRAOCULAR LENS PLACEMENT RIGHT EYE;  Surgeon: Tonny Branch, MD;  Location: [facility];  Service: Ophthalmology;  Laterality: Right;  CDE: 6.63   CHOLECYSTECTOMY N/A 08/21/2018   Procedure: LAPAROSCOPIC CHOLECYSTECTOMY;  Surgeon: Virl Cagey, MD;  Location: [facility];  Service: General;  Laterality: N/A;   CYSTOSCOPY WITH STENT PLACEMENT Right 09/28/2017   Procedure: CYSTOSCOPY WITH STENT PLACEMENT;  Surgeon: Franchot Gallo, MD;  Location: [facility];  Service: Urology;  Laterality: Right;   CYSTOSCOPY WITH  URETEROSCOPY, STONE BASKETRY AND STENT PLACEMENT Right 04/06/2015   Procedure: CYSTOSCOPY WITH RIGHT URETEROSCOPY, STONE BASKETRY AND STENT PLACEMENT;  Surgeon: Irine Seal, MD;  Location: [facility];  Service: Urology;  Laterality: Right;   CYSTOSCOPY/RETROGRADE/URETEROSCOPY Right 11/28/2017   Procedure: CYSTOSCOPY,  REMOVAL OF RIGHT URETERAL STENT, RIGHT RETROGRADE PYELOGRAM, RIGHT URETEROSCOPY, STONE BASKET EXTRACTION RIGHT URETERAL CALCULUS;  Surgeon: Cleon Gustin, MD;  Location: [facility];  Service: Urology;  Laterality: Right;   HOLMIUM LASER APPLICATION Right 7/56/4332   Procedure: HOLMIUM LASER APPLICATION;  Surgeon: Irine Seal, MD;  Location: [facility];   Service: Urology;  Laterality: Right;   KNEE ARTHROSCOPY WITH LATERAL MENISECTOMY Right 05/09/2022   Procedure: KNEE ARTHROSCOPY WITH LATERAL MENISECTOMY;  Surgeon: Carole Civil, MD;  Location: [facility];  Service: Orthopedics;  Laterality: Right;   KNEE SURGERY     left   ROTATOR CUFF REPAIR Left    thumb surgery     left   Social History   Socioeconomic History   Marital status: Married    Spouse name: Not on file   Number of children: Not on file   Years of education: Not on file   Highest education level: Not on file  Occupational History   Not on file  Tobacco Use   Smoking status: Every Day    Packs/day: 0.50    Years: 50.00    Total pack years: 25.00    Types: Cigarettes   Smokeless tobacco: Never  Vaping Use   Vaping Use: Never used  Substance and Sexual Activity   Alcohol use: No   Drug use: No   Sexual activity: Not Currently    Birth control/protection: Post-menopausal  Other Topics Concern   Not on file  Social History Narrative   Not on file   Social Determinants of Health   Financial Resource Strain: Not on file  Food Insecurity: Not on file  Transportation Needs: Not on file  Physical Activity: Not on file  Stress: Not on file  Social Connections: Not on file   Family History  Problem Relation Age of Onset   COPD Father    Allergies  Allergen Reactions   Ciprofloxacin Hives and Other (See Comments)    Can take levaquin with no problems per patient and family   Sulfa Antibiotics Itching   Prior to Admission medications   Medication Sig Start Date End Date Taking? Authorizing Provider  albuterol (VENTOLIN HFA) 108 (90 Base) MCG/ACT inhaler Inhale 2 puffs into the lungs every 4 (four) hours as needed for shortness of breath or wheezing. 08/26/19  Yes [provider]  aspirin 325 MG tablet Take 325 mg by mouth daily.   Yes [provider]  aspirin-acetaminophen-caffeine (EXCEDRIN MIGRAINE) [phone] MG tablet Take 2 tablets  by mouth every 6 (six) hours as needed for headache.   Yes [provider]  citalopram (CELEXA) 10 MG tablet Take 10 mg by mouth at bedtime.   Yes [provider]  cyclobenzaprine (FLEXERIL) 5 MG tablet Take 1 tablet (5 mg total) by mouth 3 (three) times daily as needed for muscle spasms. 07/13/22  Yes Carole Civil, MD  diazepam (VALIUM) 5 MG tablet Take 5 mg by mouth every 4 (four) hours as needed for anxiety. 02/04/20  Yes [provider]  diphenoxylate-atropine (LOMOTIL) 2.5-0.025 MG tablet Take 1 tablet by mouth as needed for diarrhea or loose stools. 05/15/22  Yes [provider]  gabapentin (NEURONTIN) 100 MG capsule Take 200 mg by mouth  at bedtime. 02/16/20  Yes [provider]  HYDROcodone-acetaminophen (NORCO/VICODIN) 5-325 MG tablet Take 1 tablet by mouth every 6 (six) hours as needed for moderate pain. 02/04/20  Yes [provider]  levothyroxine (SYNTHROID) 175 MCG tablet Take 175 mcg by mouth daily before breakfast. 06/28/20  Yes [provider]  meclizine (DRAMAMINE) 25 MG tablet Take 25 mg by mouth 3 (three) times daily as needed for nausea.   Yes [provider]  Melatonin 10 MG TABS Take 10 mg by mouth at bedtime.   Yes [provider]  metoprolol tartrate (LOPRESSOR) 25 MG tablet Take 25 mg by mouth daily. 06/28/20  Yes [provider]  nitrofurantoin (MACRODANTIN) 50 MG capsule TAKE ONE CAPSULE BY MOUTH AT BEDTIME 07/19/22  Yes McKenzie, Candee Furbish, MD  torsemide (DEMADEX) 10 MG tablet Take 10 mg by mouth daily. 01/16/20  Yes [provider]   CT Hip Right Wo Contrast  Result Date: 10/09/2022 CLINICAL DATA:  Hip trauma, fracture suspected, xray done EXAM: CT OF THE RIGHT HIP WITHOUT CONTRAST TECHNIQUE: Multidetector CT imaging of the right hip was performed according to the standard protocol. Multiplanar CT image reconstructions were also generated. RADIATION DOSE REDUCTION: This exam was  performed according to the departmental dose-optimization program which includes automated exposure control, adjustment of the mA and/or kV according to patient size and/or use of iterative reconstruction technique. COMPARISON:  X-ray right hip 10/09/2022 FINDINGS: Bones/Joint/Cartilage Acute displaced greater trochanteric/intertrochanteric fracture (6:78). No other acute displaced fracture. No hip dislocation. No joint effusion. Ligaments Suboptimally assessed by CT. Muscles and Tendons Grossly unremarkable. Soft tissues No large hematoma formation. Subcutaneus soft tissue edema along the ischial tuberosity and lower gluteal soft tissues with associated calcifications. No organized fluid collection. Other: Colonic diverticulosis. IMPRESSION: Acute displaced greater trochanteric/intertrochanteric fracture. Electronically Signed   By: Iven Finn M.D.   On: 10/09/2022 21:36   DG Hip Unilat  With Pelvis 2-3 Views Right  Result Date: 10/09/2022 CLINICAL DATA:  Right hip pain with difficulty bearing weight after falling 1 week ago. EXAM: DG HIP (WITH OR WITHOUT PELVIS) 2-3V RIGHT COMPARISON:  Pelvic and right hip radiographs 01/22/2017. Abdominopelvic CT 07/31/2018. FINDINGS: The bones appear demineralized. There is suboptimal evaluation of the intertrochanteric region of the right femur due to external rotation on all views. This makes exclusion of an intertrochanteric fracture difficult. The femoral neck appears intact. There is no dislocation or femoral head osteonecrosis. Degenerative changes are present in the lower lumbar spine and both hips. There are postsurgical changes at the symphysis pubis. IMPRESSION: Suboptimal evaluation of the intertrochanteric region of the right femur due to external rotation on all views. Recommend further evaluation with right hip CT. Electronically Signed   By: Richardean Sale M.D.   On: 10/09/2022 17:52   Family History Reviewed and non-contributory, no pertinent history  of problems with bleeding or anesthesia    Review of Systems No fevers or chills No numbness or tingling No chest pain No shortness of breath No bowel or bladder dysfunction No GI distress No headaches    OBJECTIVE  Vitals:Patient Vitals for the past 8 hrs:  BP Temp Temp src Pulse Resp SpO2  10/10/22 1248 -- 97.8 F (36.6 C) Oral -- -- --  10/10/22 1130 107/76 -- -- 62 18 95 %  10/10/22 1000 (!) 130/49 -- -- 78 19 94 %  10/10/22 0751 -- 97.8 F (36.6 C) Oral -- -- --  10/10/22 0730 (!) 165/60 -- -- 84 18 95 %  10/10/22 0700 (!) 149/61 -- -- 80 -- 94 %   General: Alert, no acute distress Cardiovascular: Extremities are warm Respiratory: No cyanosis, no use of accessory musculature Skin: No lesions in the area of chief complaint  Neurologic: Sensation intact distally  Psychiatric: Patient is competent for consent with normal mood and affect Lymphatic: No swelling obvious and reported other than the area involved in the exam below Extremities  RLE: Extremity held in a fixed position.  ROM deferred due to known fracture.  Sensation is intact distally in the sural, saphenous, DP, SP, and plantar nerve distribution. 2+ DP pulse.  Toes are WWP.  Active motion intact in the TA/EHL/GS. LLE: Sensation is intact distally in the sural, saphenous, DP, SP, and plantar nerve distribution. 2+ DP pulse.  Toes are WWP.  Active motion intact in the TA/EHL/GS. Tolerates gentle ROM of the hip.  No pain with axial loading.     Test Results Imaging XR and CT scan of the Right hip demonstrates an acute fracture of the greater trochanter, with extension into the intertrochanteric region.  Intertrochanteric region is essentially nondisplaced.  Labs cbc Recent Labs    10/09/22 2112 10/10/22 0518  WBC 13.8* 9.2  HGB 14.5 14.3  HCT 44.3 44.0  PLT 331 291    Labs inflam No results for input(s): "CRP" in the last 72 hours.  Invalid input(s): "ESR"  Labs coag Recent Labs     10/09/22 2112  INR 1.0    Recent Labs    10/09/22 2112 10/10/22 0518  NA 135 138  K 4.2 4.0  CL 101 103  CO2 26 27  GLUCOSE 129* 120*  BUN 17 12  CREATININE 0.81 0.63  CALCIUM 9.3 9.0

## 2022-10-10 NOTE — TOC Initial Note (Addendum)
Transition of Care [facility]) - Initial/Assessment Note    Patient Details  Name: Courtney Orozco MRN: [id number] Date of Birth: June 28, 1951  Transition of Care [facility]) CM/SW Contact:    Boneta Lucks, RN Phone Number: 10/10/2022, 2:01 PM  Clinical Narrative:     Patient admitted with closed displaced intertrochanteric fracture of right femor. Patient in ED, waiting on room and surgery.  CM spoke with her husband.  He states patient does not want to go to SNF for rehab. They have two walkers and he will be home with her at all times. They want home health PT.  Referral sent to [facility] with [facility]. TOC to follow.               Addendum : Latricia Heft accepted, added to AVS, need orders.   Expected Discharge Plan: [place] Barriers to Discharge: Continued Medical Work up   Patient Goals and CMS Choice Patient states their goals for this hospitalization and ongoing recovery are:: to go home. CMS Medicare.gov Compare Post Acute Care list provided to:: Patient Represenative (must comment) Choice offered to / list presented to : Spouse  Expected Discharge Plan and Services Expected Discharge Plan: [place] Acute Care Choice: [place] arrangements for the past 2 months: Single Family Home                           HH Arranged: PT HH Agency: [place] Date [place]: 10/10/22 Time HH Agency Contacted: 71 Representative spoke with at [place]: [place] Arrangements/Services Living arrangements for the past 2 months: [place] Lives with:: Spouse Patient language and need for interpreter reviewed:: Yes Do you feel safe going back to the place where you live?: Yes      Need for Family Participation in Patient Care: Yes (Comment) Care giver support system in place?: Yes (comment) Current home services: DME Criminal Activity/Legal Involvement Pertinent to Current Situation/Hospitalization: No -  Comment as needed  Activities of Daily Living      Permission Sought/Granted      Share Information with NAME: Ronald Pippins     Permission granted to share info w Relationship: Husband  Permission granted to share info w Contact Information: Home health  Emotional Assessment       Orientation: : Oriented to Self, Oriented to Place, Oriented to  Time, Oriented to Situation Alcohol / Substance Use: Not Applicable Psych Involvement: No (comment)  Admission diagnosis:  Closed displaced intertrochanteric fracture of right femur ([place]) [S72.141A] Patient Active Problem List   Diagnosis Date Noted   Displaced intertrochanteric fracture of right femur, init ([place]) 10/10/2022   Closed displaced intertrochanteric fracture of right femur ([place]) 10/10/2022   Complex tear of lateral meniscus of right knee as current injury    Insomnia 04/26/2022   Chronic cystitis with hematuria 09/08/2020   Calculus of gallbladder without cholecystitis without obstruction 08/13/2018   Infection due to ESBL-producing Escherichia coli 07/08/2018   Sepsis secondary to UTI ([place]) 05/22/2018   GERD (gastroesophageal reflux disease) 05/22/2018   Hypothyroidism 05/22/2018   Type 2 diabetes mellitus ([place]) 05/22/2018   Anxiety and depression 05/22/2018   HCAP (healthcare-associated pneumonia) 05/22/2018   Chronic back pain 05/22/2018   Bacteremia 68/34/1962   Acute metabolic encephalopathy 22/97/9892   Shortness of breath 09/30/2017   ESBL E Coli- UTI (urinary tract infection) 09/28/2017   Leukocytosis  09/28/2017   Right ureteral stone    Frequent falls 08/15/2017   Hypotension due to drugs 02/16/2017   Syncope 01/22/2017   CKD (chronic kidney disease) stage 3, GFR 30-59 ml/min (HCC) 10/22/2015   Nephrolithiasis 04/07/2015   Renal stone 04/06/2015   Intertrigo 03/26/2015   Fasting hyperglycemia 01/25/2015   Controlled substance agreement signed 09/09/2013   Hyperlipidemia 12/27/2010   Hypertension 02/04/2010    History of colonic polyps 01/08/2001   Family history of malignant neoplasm of gastrointestinal tract 09/05/2000   Chronic hepatitis B with hepatic coma ([place]) 12/15/1999   Edema 08/09/1999   PCP:  Galen Manila, MD Pharmacy:   [facility] [place] [address] Phone: [phone] Fax: [phone]   Readmission Risk Interventions    10/10/2022    1:58 PM  Readmission Risk Prevention Plan  Post Dischage Appt Not Complete  Transportation Screening Complete

## 2022-10-10 NOTE — Progress Notes (Signed)
Courtney Orozco is a 71 y.o. female with medical history significant of hypertension Dm2 w CKD stage3, hx of TIA,  Hypothyroidism apparently fell 1 week ago, and then yesterday felt a pop over her right hip.  Patient was admitted for displaced right intertrochanteric fracture in the setting of recent fall.  Seen by orthopedics with plans for operative repair in a.m. and orders placed for n.p.o. after midnight.  Seen and evaluated at bedside and has been admitted after midnight with plans to repeat a.m. labs.  Total care time: 25 minutes.

## 2022-10-11 ENCOUNTER — Other Ambulatory Visit: Payer: Self-pay

## 2022-10-11 ENCOUNTER — Inpatient Hospital Stay (HOSPITAL_COMMUNITY): Payer: PPO | Admitting: Certified Registered Nurse Anesthetist

## 2022-10-11 ENCOUNTER — Encounter (HOSPITAL_COMMUNITY): Payer: Self-pay | Admitting: Internal Medicine

## 2022-10-11 ENCOUNTER — Encounter (HOSPITAL_COMMUNITY)
Admission: EM | Disposition: A | Discharge status: Home Health | Payer: Self-pay | Source: Home / Self Care | Attending: Family Medicine

## 2022-10-11 ENCOUNTER — Inpatient Hospital Stay (HOSPITAL_COMMUNITY): Payer: PPO

## 2022-10-11 DIAGNOSIS — S72141A Displaced intertrochanteric fracture of right femur, initial encounter for closed fracture: Secondary | ICD-10-CM

## 2022-10-11 DIAGNOSIS — I10 Essential (primary) hypertension: Secondary | ICD-10-CM

## 2022-10-11 DIAGNOSIS — E1122 Type 2 diabetes mellitus with diabetic chronic kidney disease: Secondary | ICD-10-CM | POA: Diagnosis not present

## 2022-10-11 DIAGNOSIS — N1832 Chronic kidney disease, stage 3b: Secondary | ICD-10-CM | POA: Diagnosis not present

## 2022-10-11 DIAGNOSIS — E039 Hypothyroidism, unspecified: Secondary | ICD-10-CM | POA: Diagnosis not present

## 2022-10-11 DIAGNOSIS — J189 Pneumonia, unspecified organism: Secondary | ICD-10-CM

## 2022-10-11 DIAGNOSIS — F17210 Nicotine dependence, cigarettes, uncomplicated: Secondary | ICD-10-CM

## 2022-10-11 DIAGNOSIS — S72141D Displaced intertrochanteric fracture of right femur, subsequent encounter for closed fracture with routine healing: Secondary | ICD-10-CM | POA: Diagnosis not present

## 2022-10-11 HISTORY — PX: INTRAMEDULLARY (IM) NAIL INTERTROCHANTERIC: SHX5875

## 2022-10-11 LAB — BASIC METABOLIC PANEL
Anion gap: 7 (ref 5–15)
BUN: 11 mg/dL (ref 8–23)
CO2: 29 mmol/L (ref 22–32)
Calcium: 9 mg/dL (ref 8.9–10.3)
Chloride: 102 mmol/L (ref 98–111)
Creatinine, Ser: 0.76 mg/dL (ref 0.44–1.00)
GFR, Estimated: >60 mL/min (ref >60)
Glucose, Bld: 117 mg/dL — ABNORMAL HIGH (ref 70–99)
Potassium: 3.8 mmol/L (ref 3.5–5.1)
Sodium: 138 mmol/L (ref 135–145)

## 2022-10-11 LAB — CBC
HCT: 41.8 % (ref 36.0–46.0)
Hemoglobin: 13.2 g/dL (ref 12.0–15.0)
MCH: 30.7 pg (ref 26.0–34.0)
MCHC: 31.6 g/dL (ref 30.0–36.0)
MCV: 97.2 fL (ref 80.0–100.0)
Platelets: 267 10*3/uL (ref 150–400)
RBC: 4.3 MIL/uL (ref 3.87–5.11)
RDW: 13.7 % (ref 11.5–15.5)
WBC: 7.9 10*3/uL (ref 4.0–10.5)
nRBC: 0 % (ref 0.0–0.2)

## 2022-10-11 LAB — GLUCOSE, CAPILLARY
Glucose-Capillary: 130 mg/dL — ABNORMAL HIGH (ref 70–99)
Glucose-Capillary: 127 mg/dL — ABNORMAL HIGH (ref 70–99)
Glucose-Capillary: 135 mg/dL — ABNORMAL HIGH (ref 70–99)
Glucose-Capillary: 148 mg/dL — ABNORMAL HIGH (ref 70–99)
Glucose-Capillary: 154 mg/dL — ABNORMAL HIGH (ref 70–99)
Glucose-Capillary: 120 mg/dL — ABNORMAL HIGH (ref 70–99)

## 2022-10-11 LAB — HEMOGLOBIN A1C
Hgb A1c MFr Bld: 6.3 % — ABNORMAL HIGH (ref 4.8–5.6)
Mean Plasma Glucose: 134 mg/dL

## 2022-10-11 LAB — MAGNESIUM: Magnesium: 2 mg/dL (ref 1.7–2.4)

## 2022-10-11 SURGERY — FIXATION, FRACTURE, INTERTROCHANTERIC, WITH INTRAMEDULLARY ROD
Anesthesia: Spinal | Laterality: Right

## 2022-10-11 MED ORDER — STERILE WATER FOR IRRIGATION IR SOLN
Status: DC | PRN
  Administered 2022-10-11: 1000 mL

## 2022-10-11 MED ORDER — ONDANSETRON HCL 4 MG/2ML IJ SOLN
INTRAMUSCULAR | Status: DC | PRN
  Administered 2022-10-11: 4 mg via INTRAVENOUS

## 2022-10-11 MED ORDER — MIDAZOLAM HCL 2 MG/2ML IJ SOLN
INTRAMUSCULAR | Status: AC
  Filled 2022-10-11: qty 2

## 2022-10-11 MED ORDER — ONDANSETRON HCL 4 MG/2ML IJ SOLN: 4.0000 mg | Freq: Once | INTRAMUSCULAR | Status: DC | PRN

## 2022-10-11 MED ORDER — FENTANYL CITRATE (PF) 250 MCG/5ML IJ SOLN
INTRAMUSCULAR | Status: DC | PRN
  Administered 2022-10-11 (×6): 25 ug via INTRAVENOUS

## 2022-10-11 MED ORDER — FENTANYL CITRATE PF 50 MCG/ML IJ SOSY
25.0000 ug | PREFILLED_SYRINGE | INTRAMUSCULAR | Status: DC | PRN
  Administered 2022-10-11: 50 ug via INTRAVENOUS
  Filled 2022-10-11: qty 1

## 2022-10-11 MED ORDER — PROPOFOL 500 MG/50ML IV EMUL
INTRAVENOUS | Status: DC | PRN
  Administered 2022-10-11: 20 ug/kg/min via INTRAVENOUS

## 2022-10-11 MED ORDER — LIDOCAINE HCL (CARDIAC) PF 100 MG/5ML IV SOSY
PREFILLED_SYRINGE | INTRAVENOUS | Status: DC | PRN
  Administered 2022-10-11: 60 mg via INTRAVENOUS

## 2022-10-11 MED ORDER — LACTATED RINGERS IV SOLN: INTRAVENOUS | Status: DC

## 2022-10-11 MED ORDER — DEXAMETHASONE SODIUM PHOSPHATE 10 MG/ML IJ SOLN
INTRAMUSCULAR | Status: AC
  Filled 2022-10-11: qty 1

## 2022-10-11 MED ORDER — ORAL CARE MOUTH RINSE: 15.0000 mL | Freq: Once | OROMUCOSAL | Status: AC

## 2022-10-11 MED ORDER — PANTOPRAZOLE SODIUM 40 MG PO TBEC
40.0000 mg | DELAYED_RELEASE_TABLET | Freq: Every day | ORAL | Status: DC
  Administered 2022-10-11 – 2022-10-12 (×2): 40 mg via ORAL
  Filled 2022-10-11 (×2): qty 1

## 2022-10-11 MED ORDER — LIDOCAINE HCL (PF) 2 % IJ SOLN
INTRAMUSCULAR | Status: AC
  Filled 2022-10-11: qty 5

## 2022-10-11 MED ORDER — SODIUM CHLORIDE 0.9 % IR SOLN
Status: DC | PRN
  Administered 2022-10-11: 1000 mL

## 2022-10-11 MED ORDER — PHENYLEPHRINE HCL-NACL 20-0.9 MG/250ML-% IV SOLN
INTRAVENOUS | Status: AC
  Filled 2022-10-11: qty 250

## 2022-10-11 MED ORDER — BUPIVACAINE HCL (PF) 0.5 % IJ SOLN
INTRAMUSCULAR | Status: AC
  Filled 2022-10-11: qty 30

## 2022-10-11 MED ORDER — ASPIRIN 81 MG PO TBEC
81.0000 mg | DELAYED_RELEASE_TABLET | Freq: Two times a day (BID) | ORAL | Status: DC
  Administered 2022-10-12: 81 mg via ORAL
  Filled 2022-10-11: qty 1

## 2022-10-11 MED ORDER — ONDANSETRON HCL 4 MG/2ML IJ SOLN
INTRAMUSCULAR | Status: AC
  Filled 2022-10-11: qty 2

## 2022-10-11 MED ORDER — DEXAMETHASONE SODIUM PHOSPHATE 10 MG/ML IJ SOLN
INTRAMUSCULAR | Status: DC | PRN
  Administered 2022-10-11: 5 mg via INTRAVENOUS

## 2022-10-11 MED ORDER — BUPIVACAINE HCL (PF) 0.5 % IJ SOLN
INTRAMUSCULAR | Status: DC | PRN
  Administered 2022-10-11: 30 mL

## 2022-10-11 MED ORDER — HYDROCODONE-ACETAMINOPHEN 7.5-325 MG PO TABS: 1.0000 | ORAL_TABLET | Freq: Once | ORAL | Status: DC | PRN

## 2022-10-11 MED ORDER — FENTANYL CITRATE (PF) 250 MCG/5ML IJ SOLN
INTRAMUSCULAR | Status: AC
  Filled 2022-10-11: qty 5

## 2022-10-11 MED ORDER — PROPOFOL 10 MG/ML IV BOLUS
INTRAVENOUS | Status: AC
  Filled 2022-10-11: qty 20

## 2022-10-11 MED ORDER — PROPOFOL 500 MG/50ML IV EMUL
INTRAVENOUS | Status: AC
  Filled 2022-10-11: qty 50

## 2022-10-11 MED ORDER — PROPOFOL 10 MG/ML IV BOLUS
INTRAVENOUS | Status: DC | PRN
  Administered 2022-10-11: 120 mg via INTRAVENOUS

## 2022-10-11 MED ORDER — CEFAZOLIN SODIUM-DEXTROSE 2-4 GM/100ML-% IV SOLN
2.0000 g | Freq: Three times a day (TID) | INTRAVENOUS | Status: AC
  Administered 2022-10-11 – 2022-10-12 (×3): 2 g via INTRAVENOUS
  Filled 2022-10-11 (×3): qty 100

## 2022-10-11 MED ORDER — CHLORHEXIDINE GLUCONATE 0.12 % MT SOLN
15.0000 mL | Freq: Once | OROMUCOSAL | Status: AC
  Administered 2022-10-11: 15 mL via OROMUCOSAL

## 2022-10-11 SURGICAL SUPPLY — 63 items
APL PRP STRL LF DISP 70% ISPRP (MISCELLANEOUS) ×1
BIT DRILL 4.0X280 (BIT) IMPLANT
BIT DRILL AO GAMMA 4.2X180 (BIT) IMPLANT
BLADE SURG SZ10 CARB STEEL (BLADE) ×2 IMPLANT
BNDG GAUZE ELAST 4 BULKY (GAUZE/BANDAGES/DRESSINGS) ×2 IMPLANT
BRUSH SCRUB EZ W/ULTRADEX 3%PC (MISCELLANEOUS) IMPLANT
CHLORAPREP W/TINT 26 (MISCELLANEOUS) ×2 IMPLANT
CLOTH BEACON ORANGE TIMEOUT ST (SAFETY) ×2 IMPLANT
COVER LIGHT HANDLE STERIS (MISCELLANEOUS) ×4 IMPLANT
COVER MAYO STAND XLG (MISCELLANEOUS) ×2 IMPLANT
COVER PERINEAL POST (MISCELLANEOUS) ×2 IMPLANT
DECANTER SPIKE VIAL GLASS SM (MISCELLANEOUS) ×2 IMPLANT
DRAPE STERI IOBAN 125X83 (DRAPES) ×2 IMPLANT
DRSG MEPILEX SACRM 8.7X9.8 (GAUZE/BANDAGES/DRESSINGS) ×2 IMPLANT
DRSG PAD ABDOMINAL 8X10 ST (GAUZE/BANDAGES/DRESSINGS) ×2 IMPLANT
DRSG TEGADERM 4X10 (GAUZE/BANDAGES/DRESSINGS) IMPLANT
DRSG TEGADERM 4X4.75 (GAUZE/BANDAGES/DRESSINGS) ×8 IMPLANT
ELECT REM PT RETURN 9FT ADLT (ELECTROSURGICAL) ×1
ELECTRODE REM PT RTRN 9FT ADLT (ELECTROSURGICAL) ×2 IMPLANT
GAUZE SPONGE 4X4 12PLY STRL (GAUZE/BANDAGES/DRESSINGS) ×2 IMPLANT
GAUZE XEROFORM 1X8 LF (GAUZE/BANDAGES/DRESSINGS) ×4 IMPLANT
GLOVE BIO SURGEON STRL SZ8 (GLOVE) ×4 IMPLANT
GLOVE BIOGEL PI IND STRL 7.0 (GLOVE) ×4 IMPLANT
GLOVE SRG 8 PF TXTR STRL LF DI (GLOVE) ×2 IMPLANT
GLOVE SURG UNDER POLY LF SZ8 (GLOVE) ×1
GOWN STRL REUS W/ TWL XL LVL3 (GOWN DISPOSABLE) ×2 IMPLANT
GOWN STRL REUS W/TWL LRG LVL3 (GOWN DISPOSABLE) ×2 IMPLANT
GOWN STRL REUS W/TWL XL LVL3 (GOWN DISPOSABLE) ×1
GUIDEROD T2 3X1000 (ROD) IMPLANT
GUIDEWIRE BALL NOSE 3.0X900 (WIRE) ×1
GUIDEWIRE ORTH 900X3XBALL NOSE (WIRE) IMPLANT
INST SET MAJOR BONE (KITS) ×2 IMPLANT
K-WIRE  3.2X450M STR (WIRE)
K-WIRE 3.2X450M STR (WIRE)
KIT BLADEGUARD II DBL (SET/KITS/TRAYS/PACK) ×2 IMPLANT
KIT TURNOVER CYSTO (KITS) ×2 IMPLANT
KWIRE 3.2X450M STR (WIRE) IMPLANT
MANIFOLD NEPTUNE II (INSTRUMENTS) ×2 IMPLANT
MARKER SKIN DUAL TIP RULER LAB (MISCELLANEOUS) ×2 IMPLANT
NAIL ES TROCH L 10X36X125 (Nail) IMPLANT
NDL HYPO 21X1.5 SAFETY (NEEDLE) ×2 IMPLANT
NEEDLE HYPO 21X1.5 SAFETY (NEEDLE) ×1 IMPLANT
NS IRRIG 1000ML POUR BTL (IV SOLUTION) ×2 IMPLANT
PACK BASIC III (CUSTOM PROCEDURE TRAY) ×1
PACK SRG BSC III STRL LF ECLPS (CUSTOM PROCEDURE TRAY) ×2 IMPLANT
PAD ARMBOARD 7.5X6 YLW CONV (MISCELLANEOUS) ×2 IMPLANT
PENCIL SMOKE EVACUATOR COATED (MISCELLANEOUS) ×2 IMPLANT
PIN GUIDE THRD AR 3.2X330 (PIN) IMPLANT
SCREW LOCK CORT 5.0X40 (Screw) IMPLANT
SCREW LOCK LAG 10.5X95 GALILEO (Screw) IMPLANT
SET BASIN LINEN APH (SET/KITS/TRAYS/PACK) ×2 IMPLANT
SPONGE T-LAP 18X18 ~~LOC~~+RFID (SPONGE) ×4 IMPLANT
STAPLER VISISTAT (STAPLE) IMPLANT
STRIP CLOSURE SKIN 1/2X4 (GAUZE/BANDAGES/DRESSINGS) IMPLANT
SUT MNCRL AB 4-0 PS2 18 (SUTURE) IMPLANT
SUT MON AB 2-0 CT1 36 (SUTURE) ×2 IMPLANT
SUT VIC AB 0 CT1 27 (SUTURE) ×1
SUT VIC AB 0 CT1 27XBRD ANTBC (SUTURE) ×2 IMPLANT
SYR 30ML LL (SYRINGE) ×2 IMPLANT
SYR BULB IRRIG 60ML STRL (SYRINGE) ×4 IMPLANT
TOOL ACTIVATION (INSTRUMENTS) IMPLANT
TRAY FOLEY MTR SLVR 16FR STAT (SET/KITS/TRAYS/PACK) ×2 IMPLANT
YANKAUER SUCT BULB TIP 10FT TU (MISCELLANEOUS) ×2 IMPLANT

## 2022-10-11 NOTE — Progress Notes (Signed)
   ORTHOPAEDIC PROGRESS NOTE  Scheduled for Right Hip Cephalomedullary nail  DOS: 10/11/2022  SUBJECTIVE: No issues over night.  Her pain improved.  She was able to get some rest.  No questions this morning.   OBJECTIVE: PE:  Alert and oriented.  No acute distress  Nasal cannula in place Right hip without deformity Active motion intact distally Sensation intact to dorsal right foot Foley catheter in place  Vitals:   10/11/22 0709 10/11/22 0715  BP:    Pulse: 65 70  Resp: 17 (!) 21  Temp:    SpO2: 91% 91%      Latest Ref Rng & Units 10/11/2022    3:04 AM 10/10/2022    5:18 AM 10/09/2022    9:12 PM  CBC  WBC 4.0 - 10.5 K/uL 7.9  9.2  13.8   Hemoglobin 12.0 - 15.0 g/dL 13.2  14.3  14.5   Hematocrit 36.0 - 46.0 % 41.8  44.0  44.3   Platelets 150 - 400 K/uL 267  291  331      ASSESSMENT: Courtney Orozco is a 71 y.o. female doing well.  Ready for OR.  NPO since midnight.   PLAN: Weightbearing: NWB RLE Insicional and dressing care: Reinforce dressings as needed; none currently Orthopedic device(s): None VTE prophylaxis: Recommend Aspirin '81mg'$  BID; to begin POD#1 Pain control: PO pain medications as needed; judicious use of narcotics Follow - up plan: 2 weeks   Contact information:     Rylynn Kobs A. Amedeo Kinsman, MD [facility] [address] [place],  [place]  [address] Phone: [phone] Fax: [phone]

## 2022-10-11 NOTE — Op Note (Signed)
Orthopaedic Surgery Operative Note (CSN: [id number])  Courtney Orozco  [birth year] Date of Surgery: 10/11/2022   Diagnoses:  Right intertrochanteric femur fracture  Procedure: Cephalomedullary nail for Right intertrochanteric femur fracture   Operative Finding Successful completion of the planned procedure.  10 x 360 x 125 degrees nail passed with 95 mm cephalomedullary lag screw and a 40 mm distal interlocking screw   Post-Op Diagnosis: Same Surgeons:Primary: Mordecai Rasmussen, MD Assistants: None Location: [facility] OR ROOM 3 Anesthesia: General with local anesthesia Antibiotics: Ancef 2 g Tourniquet time: N/A Estimated Blood Loss: 379 cc Complications: None Specimens: None Implants: Implant Name Type Inv. Item Serial No. Manufacturer Lot No. LRB No. Used Action  ES Nail   [id number]   Right 1 Implanted  Lag Screw   [id number]   Right 1 Implanted  cortical screw   [id number]   Right 1 Implanted    Indications for Surgery:   Courtney Orozco is a 71 y.o. female who had a mechanical fall and sustained a Right intertrochanteric femur fracture.  I recommended operative fixation to restore stability and allow the patient to ambulate immediately postop.  Benefits and risks of operative and nonoperative management were discussed prior to surgery with the patient and informed consent form was completed.  Specific risks including infection, need for additional surgery, persistent pain, bleeding, malunion, nonunion and more severe complications associated with anesthesia.  The patient/guardian elected to proceed and surgical consent was obtained.    Procedure:   The patient was identified properly. Informed consent was obtained and the surgical site was marked. The patient was taken to the OR where general anesthesia was induced.   The patient was placed supine on a fracture table and appropriate reduction was obtained and visualized on fluoroscopy prior to the beginning of the procedure.  Timeout was  performed before the beginning of the case.  Ancef 2 g dosing was confirmed prior to making incision.  The patient received TXA prior to the start of surgery.   We made an incision proximal to the greater trochanter and dissected down through the fascia.  It was difficult to easily identify the starting point due to the patient's body habitus.  We initially tried to carefully place a guidepin, but ultimately used the awl to gain entrance into the intramedullary canal.  We then placed the ball-tipped guidewire under direct fluoroscopic guidance.  Next, the entry reamer was used to gain entry into the intramedullary canal.  Once again, due to the body habitus, it was difficult to advance the entry reamer to the level of the lesser trochanter.  The ball tip guidewire was then confirmed to be at on appropriate level at the physeal scar of the distal femur and measurement was obtained proximally using fluoroscopy.  We selected the appropriate length of nail, as noted above.  Entry reamer was used needed but the nail was unreamed.  At this point we placed our nail localizing under fluoroscopy.  During the step, the ball-tipped guidewire was bent, and was unfortunately advancing with the nail.  The ball-tipped guidewire was subsequently advanced into the knee joint.  Once this was identified, the ball-tipped guidewire was removed.  We then continued to advance the nail and confirmed that it was at the appropriate level.  Next we used the outrigger device to pass a wire into the femoral neck, and then the cephalomedullary lag screw.  The screw was locked proximally to avoid over collapse.  We then turned our attention to the  distal interlocking screw.  Once again, we used the outrigger device to place a single interlocking screw in the midshaft area.  The outrigger device was removed and final fluoroscopic images were obtained.  The wounds were thoroughly irrigated closed in a multilayer fashion with 0 vicryl, 2-0  monocryl and 4-0 monocryl.  Sterile dressings were placed.  The patient was awoken from general anesthesia and taken to the PACU in stable condition without complication.     Post-operative plan:  Weightbearing: The patient will be WBAT on the operative extremity.   DVT prophylaxis per primary team, no orthopedic contraindications.  Recommend 81 mg Aspirin BID, unless patient cannot tolerate or was previously on anticoagulation.  Prefer to start Ppx POD#1 Pain control with PRN pain medication preferring oral medicines.   Dressing can be reinforced as needed, will change on POD#2/3 if needed.  Patient does not need to remain hospitalized for dressing change Follow up plan: approximately 2 weeks postop for incision check and XR.  If the patient will be returning to a nursing facility, sutures can be trimmed around this time and a follow up appointment can be scheduled for 6 weeks after surgery. XR at next visit:  please obtain [facility] pelvis, and 2 views of the Right femur

## 2022-10-11 NOTE — Progress Notes (Signed)
PROGRESS NOTE   Courtney Orozco  JOI:[id number] DOB: 10/21/1951 DOA: 10/09/2022 PCP: Galen Manila, MD   Chief Complaint  Patient presents with   Fall   Level of care: Telemetry  Brief Admission History:  71 y.o. female with medical history significant of hypertension Dm2 w CKD stage3, hx of TIA,  Hypothyroidism apparently fell 1 week ago, and then yesterday felt a pop over her right hip.  Pt had increase in pain in the right hip that was worse yesterday and couldn't walk and therefore presented to ED for evaluation.  Pt had CT pelvis => displaced intertrochanteric fracture of the right hip.    Assessment and Plan:  Displaced, R intertrochanteric fracture  POD#0 s/p ORIF 10/11/22  Postop per Dr. Amedeo Kinsman Ambulate with PT Pain management Aspirin 81 mg BID for DVT PPX   Dm2 w neuropathy Check hga1c Cont gabapentin '200mg'$  po qhs Fsbs q4, ISS  CBG (last 3)  Recent Labs    10/11/22 0709 10/11/22 0936 10/11/22 1122  GLUCAP 127* 135* 148*    Hypertension Cont Metoprolol '25mg'$  po qday   Hx of TIA Cont aspirin   Hypothyroidism Cont Levothyroxine 175 micrograms po qday   DVT prophylaxis: ASA  Code Status: Full  Family Communication: husband at bedside  Disposition: Status is: Inpatient Remains inpatient appropriate because: intensity of illness, IV pain management    Consultants:  Orthopedics Dr. Amedeo Kinsman Procedures:  ORIF right hip 10/11/22 Dr. Amedeo Kinsman Antimicrobials:    Subjective: Pt reports that she is ready to try to start ambulating already.  She says pain is controlled. She is motivated to get active.  Waiting for PT.   Objective: Vitals:   10/11/22 1000 10/11/22 1015 10/11/22 1029 10/11/22 1053  BP: (!) 123/57 135/61 (!) 126/50 (!) 126/54  Pulse: 73 84 85 80  Resp: '18 14 16 20  '$ Temp:      TempSrc:      SpO2: 98% 96% 93% 94%  Weight:      Height:        Intake/Output Summary (Last 24 hours) at 10/11/2022 1405 Last data filed at 10/11/2022 0918 Gross  per 24 hour  Intake 1540 ml  Output 2250 ml  Net -710 ml   Filed Weights   10/09/22 1715 10/10/22 1930 10/11/22 0500  Weight: 82.6 kg 84.6 kg 85.7 kg   Examination:  General exam: Appears calm and comfortable  Respiratory system: Clear to auscultation. Respiratory effort normal. Cardiovascular system: normal S1 & S2 heard. No JVD, murmurs, rubs, gallops or clicks. No pedal edema. Gastrointestinal system: Abdomen is nondistended, soft and nontender. No organomegaly or masses felt. Normal bowel sounds heard. Central nervous system: Alert and oriented. No focal neurological deficits. Extremities: right extremity wounds clean and dry intact.  Skin: No rashes, lesions or ulcers. Psychiatry: Judgement and insight appear normal. Mood & affect appropriate.   Data Reviewed: I have personally reviewed following labs and imaging studies  CBC: Recent Labs  Lab 10/09/22 2112 10/10/22 0518 10/11/22 0304  WBC 13.8* 9.2 7.9  NEUTROABS 9.9*  --   --   HGB 14.5 14.3 13.2  HCT 44.3 44.0 41.8  MCV 94.3 96.3 97.2  PLT 331 291 470    Basic Metabolic Panel: Recent Labs  Lab 10/09/22 2112 10/10/22 0518 10/11/22 0304  NA 135 138 138  K 4.2 4.0 3.8  CL 101 103 102  CO2 '26 27 29  '$ GLUCOSE 129* 120* 117*  BUN '17 12 11  '$ CREATININE 0.81 0.63 0.76  CALCIUM 9.3 9.0 9.0  MG  --   --  2.0    CBG: Recent Labs  Lab 10/10/22 1707 10/11/22 0354 10/11/22 0709 10/11/22 0936 10/11/22 1122  GLUCAP 104* 130* 127* 135* 148*    No results found for this or any previous visit (from the past 240 hour(s)).   Radiology Studies: DG FEMUR PORT, MIN 2 VIEWS RIGHT  Result Date: 10/11/2022 CLINICAL DATA:  Postop assessment after fixation of the right femur. EXAM: RIGHT FEMUR PORTABLE 2 VIEW COMPARISON:  Right hip radiographs dated 10/09/2022. FINDINGS: The patient is status post intramedullary nail placement and femoral neck screw across a greater trochanteric/inter trochanteric fracture. The hardware  appears intact. Osseous alignment appears anatomic. No hip dislocation. IMPRESSION: Status post intramedullary nail placement across a greater trochanteric/intertrochanteric fracture. Electronically Signed   By: Zerita Boers M.D.   On: 10/11/2022 10:11   DG C-Arm 1-60 Min  Result Date: 10/11/2022 CLINICAL DATA:  71 year old female undergoing right femur ORIF for intertrochanteric fracture. EXAM: DG C-ARM 1-60 MIN; DG HIP (WITH OR WITHOUT PELVIS) 2-3V RIGHT CONTRAST:  None. FLUOROSCOPY: Fluoroscopy Time:  1 minute 43 seconds Radiation Exposure Index (if provided by the fluoroscopic device): 24.31 mGy Number of Acquired Spot Images: 0 COMPARISON:  Right hip CT 10/09/2022. FINDINGS: 6 intraoperative fluoroscopic spot views of the femur demonstrate and intramedullary rod placement with proximal interlocking dynamic hip screw and mid or distal interlocking cortical screw. No adverse hardware features identified. Anatomic alignment of the visible proximal femur. IMPRESSION: Intraoperative femur ORIF fluoroscopy with no adverse features identified. Electronically Signed   By: Genevie Ann M.D.   On: 10/11/2022 10:10   DG HIP UNILAT WITH PELVIS 2-3 VIEWS RIGHT  Result Date: 10/11/2022 CLINICAL DATA:  71 year old female undergoing right femur ORIF for intertrochanteric fracture. EXAM: DG C-ARM 1-60 MIN; DG HIP (WITH OR WITHOUT PELVIS) 2-3V RIGHT CONTRAST:  None. FLUOROSCOPY: Fluoroscopy Time:  1 minute 43 seconds Radiation Exposure Index (if provided by the fluoroscopic device): 24.31 mGy Number of Acquired Spot Images: 0 COMPARISON:  Right hip CT 10/09/2022. FINDINGS: 6 intraoperative fluoroscopic spot views of the femur demonstrate and intramedullary rod placement with proximal interlocking dynamic hip screw and mid or distal interlocking cortical screw. No adverse hardware features identified. Anatomic alignment of the visible proximal femur. IMPRESSION: Intraoperative femur ORIF fluoroscopy with no adverse features  identified. Electronically Signed   By: Genevie Ann M.D.   On: 10/11/2022 10:10   DG Pelvis Portable  Result Date: 10/11/2022 CLINICAL DATA:  Closed fracture of the hips, postop RIGHT femoral nail. EXAM: PORTABLE PELVIS 1-2 VIEWS COMPARISON:  Femoral evaluation of the same date. FINDINGS: Osteopenia. Post intramedullary rod placement and dynamic hip screw across a RIGHT hip fracture, incompletely assessed, see dedicated femoral evaluation of the same date for further detail. Stool and gas overlies the sacrum limiting assessment of the sacrum and bilateral iliac crest. No displaced fracture is identified about the bony pelvis. Gas present in the soft tissues overlying the RIGHT hip. IMPRESSION: 1. Post intramedullary rod placement and dynamic hip screw across a RIGHT hip fracture, incompletely assessed, see dedicated femoral evaluation of the same date for further detail. 2. No displaced fracture about the bony pelvis. Electronically Signed   By: Zetta Bills M.D.   On: 10/11/2022 10:09   CT Hip Right Wo Contrast  Result Date: 10/09/2022 CLINICAL DATA:  Hip trauma, fracture suspected, xray done EXAM: CT OF THE RIGHT HIP WITHOUT CONTRAST TECHNIQUE: Multidetector CT imaging of the right  hip was performed according to the standard protocol. Multiplanar CT image reconstructions were also generated. RADIATION DOSE REDUCTION: This exam was performed according to the departmental dose-optimization program which includes automated exposure control, adjustment of the mA and/or kV according to patient size and/or use of iterative reconstruction technique. COMPARISON:  X-ray right hip 10/09/2022 FINDINGS: Bones/Joint/Cartilage Acute displaced greater trochanteric/intertrochanteric fracture (6:78). No other acute displaced fracture. No hip dislocation. No joint effusion. Ligaments Suboptimally assessed by CT. Muscles and Tendons Grossly unremarkable. Soft tissues No large hematoma formation. Subcutaneus soft tissue edema  along the ischial tuberosity and lower gluteal soft tissues with associated calcifications. No organized fluid collection. Other: Colonic diverticulosis. IMPRESSION: Acute displaced greater trochanteric/intertrochanteric fracture. Electronically Signed   By: Iven Finn M.D.   On: 10/09/2022 21:36   DG Hip Unilat  With Pelvis 2-3 Views Right  Result Date: 10/09/2022 CLINICAL DATA:  Right hip pain with difficulty bearing weight after falling 1 week ago. EXAM: DG HIP (WITH OR WITHOUT PELVIS) 2-3V RIGHT COMPARISON:  Pelvic and right hip radiographs 01/22/2017. Abdominopelvic CT 07/31/2018. FINDINGS: The bones appear demineralized. There is suboptimal evaluation of the intertrochanteric region of the right femur due to external rotation on all views. This makes exclusion of an intertrochanteric fracture difficult. The femoral neck appears intact. There is no dislocation or femoral head osteonecrosis. Degenerative changes are present in the lower lumbar spine and both hips. There are postsurgical changes at the symphysis pubis. IMPRESSION: Suboptimal evaluation of the intertrochanteric region of the right femur due to external rotation on all views. Recommend further evaluation with right hip CT. Electronically Signed   By: Richardean Sale M.D.   On: 10/09/2022 17:52    Scheduled Meds:  acetaminophen  1,000 mg Oral Q8H   Or   acetaminophen  650 mg Rectal Q8H   [START ON 10/12/2022] aspirin EC  81 mg Oral BID   citalopram  10 mg Oral QHS   gabapentin  200 mg Oral QHS   insulin aspart  0-9 Units Subcutaneous Q4H   ketorolac  30 mg Intravenous Q6H   levothyroxine  175 mcg Oral Q0600   metoprolol tartrate  25 mg Oral Daily   pantoprazole  40 mg Oral Daily   Continuous Infusions:   ceFAZolin (ANCEF) IV       LOS: 1 day   Time spent: 35 mins  Arek Spadafore Wynetta Emery, MD How to contact the [facility] Attending or Consulting provider [place] or covering provider during after hours [place], for this patient?   Check the care team in [facility] and look for a) attending/consulting [facility] provider listed and b) the [facility] team listed Log into [email or web address] and use [place] universal password to access. If you do not have the password, please contact the hospital operator. Locate the [facility] provider you are looking for under [facility] and page to a number that you can be directly reached. If you still have difficulty reaching the provider, please page the [facility] (Director on Call) for the Hospitalists listed on amion for assistance.  10/11/2022, 2:05 PM

## 2022-10-11 NOTE — Anesthesia Procedure Notes (Signed)
Procedure Name: LMA Insertion Date/Time: 10/11/2022 7:31 AM  Performed by: Karna Dupes, CRNAPre-anesthesia Checklist: Emergency Drugs available, Patient identified, Suction available and Patient being monitored Patient Re-evaluated:Patient Re-evaluated prior to induction Oxygen Delivery Method: Circle system utilized Preoxygenation: Pre-oxygenation with 100% oxygen Induction Type: IV induction LMA: LMA inserted LMA Size: 4.0 Number of attempts: 1 Placement Confirmation: positive ETCO2 and breath sounds checked- equal and bilateral Tube secured with: Tape Dental Injury: Teeth and Oropharynx as per pre-operative assessment

## 2022-10-11 NOTE — Hospital Course (Signed)
71 y.o. female with medical history significant of hypertension Dm2 w CKD stage3, hx of TIA,  Hypothyroidism apparently fell 1 week ago, and then yesterday felt a pop over her right hip.  Pt had increase in pain in the right hip that was worse yesterday and couldn't walk and therefore presented to ED for evaluation.  Pt had CT pelvis => displaced intertrochanteric fracture of the right hip.

## 2022-10-11 NOTE — Transfer of Care (Signed)
Immediate Anesthesia Transfer of Care Note  Patient: Courtney Orozco  Procedure(s) Performed: INTRAMEDULLARY (IM) NAIL INTERTROCHANTERIC (Right)  Patient Location: PACU  Anesthesia Type:General  Level of Consciousness: awake  Airway & Oxygen Therapy: Patient Spontanous Breathing and Patient connected to face mask oxygen  Post-op Assessment: Report given to RN and Post -op Vital signs reviewed and stable  Post vital signs: Reviewed and stable  Last Vitals:  Vitals Value Taken Time  BP 131/63   Temp 98.6   Pulse 82 10/11/22 0933  Resp 18 10/11/22 0933  SpO2 97 % 10/11/22 0933  Vitals shown include unvalidated device data.  Last Pain:  Vitals:   10/11/22 0649  TempSrc:   PainSc: 0-No pain      Patients Stated Pain Goal: 0 (89/78/47 8412)  Complications: No notable events documented.

## 2022-10-11 NOTE — Anesthesia Preprocedure Evaluation (Addendum)
Anesthesia Evaluation  Patient identified by MRN, date of birth, ID band Patient awake    Reviewed: Allergy & Precautions, H&P , NPO status , Patient's Chart, lab work & pertinent test results, reviewed documented beta blocker date and time   History of Anesthesia Complications (+) PONV and history of anesthetic complications  Airway Mallampati: II  TM Distance: >3 FB Neck ROM: full    Dental no notable dental hx.    Pulmonary shortness of breath, pneumonia, unresolved, Current Smoker   Pulmonary exam normal breath sounds clear to auscultation       Cardiovascular Exercise Tolerance: Good hypertension,  Rhythm:regular Rate:Normal     Neuro/Psych  Headaches PSYCHIATRIC DISORDERS Anxiety Depression    TIAnegative neurological ROS  negative psych ROS   GI/Hepatic negative GI ROS, Neg liver ROS,GERD  ,,(+) Hepatitis -  Endo/Other  diabetes, Type 2Hypothyroidism    Renal/GU Renal diseasenegative Renal ROS  negative genitourinary   Musculoskeletal   Abdominal   Peds  Hematology negative hematology ROS (+)   Anesthesia Other Findings   Reproductive/Obstetrics negative OB ROS                             Anesthesia Physical Anesthesia Plan  ASA: 3  Anesthesia Plan: General   Post-op Pain Management:    Induction: Intravenous  PONV Risk Score and Plan: Propofol infusion and Ondansetron  Airway Management Planned: LMA  Additional Equipment:   Intra-op Plan:   Post-operative Plan:   Informed Consent: I have reviewed the patients History and Physical, chart, labs and discussed the procedure including the risks, benefits and alternatives for the proposed anesthesia with the patient or authorized representative who has indicated his/her understanding and acceptance.     Dental Advisory Given  Plan Discussed with: CRNA  Anesthesia Plan Comments: (Pt refuses spinal anesthesia)        Anesthesia Quick Evaluation

## 2022-10-12 DIAGNOSIS — E039 Hypothyroidism, unspecified: Secondary | ICD-10-CM | POA: Diagnosis not present

## 2022-10-12 DIAGNOSIS — K219 Gastro-esophageal reflux disease without esophagitis: Secondary | ICD-10-CM | POA: Diagnosis not present

## 2022-10-12 DIAGNOSIS — S72141D Displaced intertrochanteric fracture of right femur, subsequent encounter for closed fracture with routine healing: Secondary | ICD-10-CM | POA: Diagnosis not present

## 2022-10-12 DIAGNOSIS — D72829 Elevated white blood cell count, unspecified: Secondary | ICD-10-CM | POA: Diagnosis not present

## 2022-10-12 LAB — BASIC METABOLIC PANEL
Anion gap: 7 (ref 5–15)
BUN: 10 mg/dL (ref 8–23)
CO2: 27 mmol/L (ref 22–32)
Calcium: 8.2 mg/dL — ABNORMAL LOW (ref 8.9–10.3)
Chloride: 102 mmol/L (ref 98–111)
Creatinine, Ser: 0.8 mg/dL (ref 0.44–1.00)
GFR, Estimated: >60 mL/min (ref >60)
Glucose, Bld: 104 mg/dL — ABNORMAL HIGH (ref 70–99)
Potassium: 4.5 mmol/L (ref 3.5–5.1)
Sodium: 136 mmol/L (ref 135–145)

## 2022-10-12 LAB — GLUCOSE, CAPILLARY
Glucose-Capillary: 88 mg/dL (ref 70–99)
Glucose-Capillary: 101 mg/dL — ABNORMAL HIGH (ref 70–99)

## 2022-10-12 MED ORDER — ACETAMINOPHEN 500 MG PO TABS: 1000.0000 mg | ORAL_TABLET | Freq: Three times a day (TID) | ORAL | 0 refills | Status: AC

## 2022-10-12 MED ORDER — PANTOPRAZOLE SODIUM 40 MG PO TBEC: 40.0000 mg | DELAYED_RELEASE_TABLET | Freq: Every day | ORAL | 0 refills | Status: AC

## 2022-10-12 MED ORDER — POLYETHYLENE GLYCOL 3350 17 G PO PACK: 17.0000 g | PACK | Freq: Every day | ORAL | 0 refills | Status: DC | PRN

## 2022-10-12 MED ORDER — ASPIRIN 81 MG PO TBEC: 81.0000 mg | DELAYED_RELEASE_TABLET | Freq: Two times a day (BID) | ORAL | 0 refills | Status: DC

## 2022-10-12 NOTE — Evaluation (Signed)
Occupational Therapy Evaluation Patient Details Name: Courtney Orozco MRN: [id number] DOB: 1951-07-27 Today's Date: 10/12/2022   History of Present Illness Courtney Orozco is a 71 y.o. female with medical history significant of hypertension Dm2 w CKD stage3, hx of TIA,  Hypothyroidism apparently fell 1 week ago, and then yesterday felt a pop over her right hip.  Pt had increase in pain in the right hip that was worse yesterday and couldn't walk and therefore presented to ED for evaluation.  Pt had CT pelvis => displaced intertrochanteric fracture of the right hip.   Clinical Impression   Pt agreeable to OT evaluation. Pt is independent at baseline but does not drive. Pt presents with general weakness and need for min G assist for standing ADL tasks. Pt was removed from supplemental O2 at start of session and desaturated to 76% while ambulating to the toilet and was up to 83% by the time she returned to the chair. Pt was placed back on 3 L supplemental O2 at the end of the session. Pt given handout and verbal education on tub bench to allow for bathing in the shower versus sponge bathing. Pt left in chair with call bell within reach. Pt will benefit from continued OT in the hospital and recommended venue below to increase strength, balance, and endurance for safe ADL's.         Recommendations for follow up therapy are one component of a multi-disciplinary discharge planning process, led by the attending physician.  Recommendations may be updated based on patient status, additional functional criteria and insurance authorization.   Follow Up Recommendations  Home health OT     Assistance Recommended at Discharge Intermittent Supervision/Assistance  Patient can return home with the following A little help with walking and/or transfers;A little help with bathing/dressing/bathroom;Assistance with cooking/housework;Assist for transportation;Help with stairs or ramp for entrance    Functional Status  Assessment  Patient has had a recent decline in their functional status and demonstrates the ability to make significant improvements in function in a reasonable and predictable amount of time.  Equipment Recommendations  Tub/shower bench (Pt given handout on tub bench)    Recommendations for Other Services       Precautions / Restrictions Precautions Precautions: Fall Restrictions Weight Bearing Restrictions: Yes RLE Weight Bearing: Weight bearing as tolerated      Mobility Bed Mobility               General bed mobility comments: Pt seated in chair at start of session.    Transfers Overall transfer level: Needs assistance Equipment used: Rolling walker (2 wheels) Transfers: Sit to/from Stand, Bed to chair/wheelchair/BSC Sit to Stand: Min guard     Step pivot transfers: Min guard     General transfer comment: Extneded time and labored movement mostly to boost from chair.      Balance Overall balance assessment: Needs assistance Sitting-balance support: Feet supported, Bilateral upper extremity supported Sitting balance-Leahy Scale: Good Sitting balance - Comments: seated in chair   Standing balance support: Bilateral upper extremity supported, Reliant on assistive device for balance Standing balance-Leahy Scale: Fair Standing balance comment: good/fair with RW                           ADL either performed or assessed with clinical judgement   ADL Overall ADL's : Needs assistance/impaired     Grooming: Min guard;Standing   Upper Body Bathing: Set up;Sitting   Lower Body  Bathing: Moderate assistance;Sitting/lateral leans;Minimal assistance   Upper Body Dressing : Set up;Sitting   Lower Body Dressing: Moderate assistance;Sitting/lateral leans;Minimal assistance   Toilet Transfer: Min guard;Rolling walker (2 wheels);Ambulation;BSC/3in1 Toilet Transfer Details (indicate cue type and reason): Pt able to ambulate to toilet with BSC placed  overtop. Min G assist throughout transfer.         Functional mobility during ADLs: Min guard;Rolling walker (2 wheels) General ADL Comments: Pt able to ambualte in room to toilet and back.     Vision Baseline Vision/History: 1 Wears glasses Ability to See in Adequate Light: 0 Adequate Patient Visual Report: No change from baseline Vision Assessment?: No apparent visual deficits                Pertinent Vitals/Pain Pain Assessment Pain Assessment: Faces Faces Pain Scale: Hurts little more Pain Location: R hip incision with pressure and mobility Pain Descriptors / Indicators: Grimacing, Guarding, Sharp Pain Intervention(s): Limited activity within patient's tolerance, Monitored during session, Patient requesting pain meds-RN notified, Repositioned     Hand Dominance Right   Extremity/Trunk Assessment Upper Extremity Assessment Upper Extremity Assessment: Generalized weakness   Lower Extremity Assessment Lower Extremity Assessment: Defer to PT evaluation RLE Deficits / Details: generally decreased strength, knee ext 4-/5 MMT pain limited   Cervical / Trunk Assessment Cervical / Trunk Assessment: Kyphotic   Communication Communication Communication: No difficulties   Cognition Arousal/Alertness: Awake/alert Behavior During Therapy: WFL for tasks assessed/performed Overall Cognitive Status: Within Functional Limits for tasks assessed                                                        Home Living Family/patient expects to be discharged to:: Private residence Living Arrangements: Spouse/significant other Available Help at Discharge: Family Type of Home: House Home Access: Ramped entrance     Home Layout: One level     Bathroom Shower/Tub: Sponge bathes at baseline;Tub/shower unit   Bathroom Toilet: Handicapped height Bathroom Accessibility: Yes   Home Equipment: Grab bars - tub/shower;Rolling Walker (2 wheels);Wheelchair -  manual;Cane - single point;Shower seat;BSC/3in1          Prior Functioning/Environment Prior Level of Function : Independent/Modified Independent             Mobility Comments: Patient states household ambulation without AD ADLs Comments: states independent        OT Problem List: Decreased strength;Decreased activity tolerance      OT Treatment/Interventions: Self-care/ADL training;Therapeutic exercise;Therapeutic activities;Patient/family education;Balance training    OT Goals(Current goals can be found in the care plan section) Acute Rehab OT Goals Patient Stated Goal: return home OT Goal Formulation: With patient Time For Goal Achievement: 10/26/22 Potential to Achieve Goals: Good  OT Frequency: Min 1X/week                                   End of Session Equipment Utilized During Treatment: Rolling walker (2 wheels);Oxygen Nurse Communication: Patient requests pain meds  Activity Tolerance: Patient tolerated treatment well Patient left: in chair;with call bell/phone within reach  OT Visit Diagnosis: Unsteadiness on feet (R26.81);Other abnormalities of gait and mobility (R26.89);Muscle weakness (generalized) (M62.81);History of falling (Z91.81)  Time: 9536-9223 OT Time Calculation (min): 26 min Charges:  OT General Charges $OT Visit: 1 Visit OT Evaluation $OT Eval Low Complexity: 1 Low  Jyrah Blye OT, MOT  Larey Seat 10/12/2022, 10:02 AM

## 2022-10-12 NOTE — TOC Transition Note (Signed)
Transition of Care [facility]) - CM/SW Discharge Note   Patient Details  Name: Courtney Orozco MRN: [id number] Date of Birth: 1951/06/07  Transition of Care [facility]) CM/SW Contact:  Salome Arnt, LCSW Phone Number: 10/12/2022, 11:43 AM   Clinical Narrative: Pt d/c today. Will need home O2. Discussed with pt who requested [facility], but they are closed. Pt agreeable to [employer]. Referred to [facility] with [facility]. Portable tank will be delivered to pt's room. OT recommended shower/tub bench but pt does not feel this is necessary. Lattie Haw with [facility] aware of d/c. HHPT order in. RN updated.      Final next level of care: [place] Barriers to Discharge: Barriers Resolved   Patient Goals and CMS Choice Patient states their goals for this hospitalization and ongoing recovery are:: to go home. CMS Medicare.gov Compare Post Acute Care list provided to:: Patient Represenative (must comment) Choice offered to / list presented to : Spouse    Discharge Placement                    Patient and family notified of of transfer: 10/12/22  Discharge Plan and Services     Post Acute Care Choice: Home Health          DME Arranged: Oxygen DME Agency: [employer] Date DME Agency Contacted: 10/12/22 Time DME Agency Contacted: [phone] Representative spoke with at DME Agency: Erasmo Downer [place]: PT [place]: [place] Date [place]: 10/10/22 Time [place]: 1400 Representative spoke with at [place]: [place] ([place]) Interventions     Readmission Risk Interventions    10/10/2022    1:58 PM  Readmission Risk Prevention Plan  Post Dischage Appt Not Complete  Transportation Screening Complete

## 2022-10-12 NOTE — Progress Notes (Signed)
SATURATION QUALIFICATIONS: (This note is used to comply with regulatory documentation for home oxygen)  Patient Saturations on Room Air at Rest = 83%  Patient Saturations on Room Air while Ambulating = 76%  Patient Saturations on 3 Liters of oxygen while Ambulating = 95%

## 2022-10-12 NOTE — Plan of Care (Signed)
  Problem: Acute Rehab OT Goals (only OT should resolve) Goal: Pt. Will Perform Grooming Flowsheets (Taken 10/12/2022 1006) Pt Will Perform Grooming:  with modified independence  standing Goal: Pt. Will Perform Lower Body Bathing Flowsheets (Taken 10/12/2022 1006) Pt Will Perform Lower Body Bathing:  with modified independence  sitting/lateral leans Goal: Pt. Will Perform Lower Body Dressing Flowsheets (Taken 10/12/2022 1006) Pt Will Perform Lower Body Dressing:  with modified independence  sitting/lateral leans Goal: Pt. Will Transfer To Toilet Flowsheets (Taken 10/12/2022 1006) Pt Will Transfer to Toilet:  with modified independence  ambulating  Garfield Coiner OT, MOT

## 2022-10-12 NOTE — Evaluation (Signed)
Physical Therapy Evaluation Patient Details Name: Courtney Orozco MRN: [id number] DOB: Nov 14, 1950 Today's Date: 10/12/2022  History of Present Illness  Courtney Orozco is a 71 y.o. female with medical history significant of hypertension Dm2 w CKD stage3, hx of TIA,  Hypothyroidism apparently fell 1 week ago, and then yesterday felt a pop over her right hip.  Pt had increase in pain in the right hip that was worse yesterday and couldn't walk and therefore presented to ED for evaluation.  Pt had CT pelvis => displaced intertrochanteric fracture of the right hip.   Clinical Impression  Patient limited for functional mobility as stated below secondary to RLE weakness, fatigue and pain. Patient does not require assist for bed mobility with HOB elevated but does require increased time to transition to seated EOB. She demonstrates good sitting balance and sitting tolerance but c/o pain with pressure at incision. She requires cueing, RW, and mod assist to transfer to standing due to RLE pain and weakness. Patient ambulates in room/hall with decreased cadence but without loss of balance with use of RW. Patient limited by fatigue and SOB with ambulation demonstrating impaired activity tolerance. Patient ends session seated in chair. Patient will benefit from continued physical therapy in hospital and recommended venue below to increase strength, balance, endurance for safe ADLs and gait.        Recommendations for follow up therapy are one component of a multi-disciplinary discharge planning process, led by the attending physician.  Recommendations may be updated based on patient status, additional functional criteria and insurance authorization.  Follow Up Recommendations Home health PT      Assistance Recommended at Discharge Intermittent Supervision/Assistance  Patient can return home with the following  A little help with walking and/or transfers;A little help with bathing/dressing/bathroom     Equipment Recommendations None recommended by PT  Recommendations for Other Services       Functional Status Assessment Patient has had a recent decline in their functional status and demonstrates the ability to make significant improvements in function in a reasonable and predictable amount of time.     Precautions / Restrictions Precautions Precautions: Fall Restrictions RLE Weight Bearing: Weight bearing as tolerated      Mobility  Bed Mobility Overal bed mobility: Modified Independent             General bed mobility comments: slow, labored, HOB elevated    Transfers Overall transfer level: Needs assistance Equipment used: Rolling walker (2 wheels) Transfers: Sit to/from Stand, Bed to chair/wheelchair/BSC Sit to Stand: Mod assist   Step pivot transfers: Min assist       General transfer comment: cueing for hand placement/sequencing, assist to power up to standing    Ambulation/Gait Ambulation/Gait assistance: Min guard Gait Distance (Feet): 50 Feet Assistive device: Rolling walker (2 wheels) Gait Pattern/deviations: Step-through pattern, Decreased stride length, Antalgic Gait velocity: decreased     General Gait Details: slow, labored, 2 standing rest breaks, gradually increasing SOB and fatigue  Stairs            Wheelchair Mobility    Modified Rankin (Stroke Patients Only)       Balance Overall balance assessment: Needs assistance Sitting-balance support: No upper extremity supported, Feet supported Sitting balance-Leahy Scale: Good Sitting balance - Comments: seated EOB   Standing balance support: Bilateral upper extremity supported, Reliant on assistive device for balance Standing balance-Leahy Scale: Fair Standing balance comment: good/fair with RW  Pertinent Vitals/Pain Pain Assessment Pain Assessment: Faces Faces Pain Scale: Hurts little more Pain Location: R hip incision with pressure  and mobility Pain Descriptors / Indicators: Grimacing, Guarding Pain Intervention(s): Limited activity within patient's tolerance, Monitored during session, Repositioned    Home Living Family/patient expects to be discharged to:: Private residence Living Arrangements: Spouse/significant other Available Help at Discharge: Family Type of Home: House Home Access: Ramped entrance       Home Layout: One level Home Equipment: Grab bars - tub/shower;Rolling Environmental consultant (2 wheels);Wheelchair - manual;Cane - single point      Prior Function Prior Level of Function : Independent/Modified Independent             Mobility Comments: Patient states household ambulation without AD ADLs Comments: states independent     Hand Dominance        Extremity/Trunk Assessment   Upper Extremity Assessment Upper Extremity Assessment: Defer to OT evaluation    Lower Extremity Assessment Lower Extremity Assessment: Overall WFL for tasks assessed;RLE deficits/detail RLE Deficits / Details: generally decreased strength, knee ext 4-/5 MMT pain limited       Communication   Communication: No difficulties  Cognition Arousal/Alertness: Awake/alert Behavior During Therapy: WFL for tasks assessed/performed Overall Cognitive Status: Within Functional Limits for tasks assessed                                          General Comments      Exercises     Assessment/Plan    PT Assessment Patient needs continued PT services  PT Problem List Decreased strength;Decreased mobility;Decreased activity tolerance;Decreased balance;Pain       PT Treatment Interventions DME instruction;Therapeutic exercise;Gait training;Balance training;Manual techniques;Stair training;Neuromuscular re-education;Functional mobility training;Therapeutic activities;Patient/family education    PT Goals (Current goals can be found in the Care Plan section)  Acute Rehab PT Goals Patient Stated Goal: return  home PT Goal Formulation: With patient Time For Goal Achievement: 10/19/22 Potential to Achieve Goals: Good    Frequency Min 3X/week     Co-evaluation               AM-PAC PT "6 Clicks" Mobility  Outcome Measure Help needed turning from your back to your side while in a flat bed without using bedrails?: None Help needed moving from lying on your back to sitting on the side of a flat bed without using bedrails?: A Little Help needed moving to and from a bed to a chair (including a wheelchair)?: A Little Help needed standing up from a chair using your arms (e.g., wheelchair or bedside chair)?: A Little Help needed to walk in hospital room?: A Little Help needed climbing 3-5 steps with a railing? : A Lot 6 Click Score: 18    End of Session Equipment Utilized During Treatment: Gait belt Activity Tolerance: Patient tolerated treatment well Patient left: in chair;with call bell/phone within reach Nurse Communication: Mobility status PT Visit Diagnosis: Unsteadiness on feet (R26.81);Other abnormalities of gait and mobility (R26.89);Muscle weakness (generalized) (M62.81);Repeated falls (R29.6)    Time: 4917-9150 PT Time Calculation (min) (ACUTE ONLY): 29 min   Charges:   PT Evaluation $PT Eval Low Complexity: 1 Low PT Treatments $Therapeutic Activity: 23-37 mins        9:05 AM, 10/12/22 Mearl Latin PT, DPT Physical Therapist at [facility]

## 2022-10-12 NOTE — Discharge Summary (Addendum)
Physician Discharge Summary  Courtney Orozco UMP:[id number] DOB: 26-Jul-1951 DOA: 10/09/2022  PCP: Galen Manila, MD Orthopedics: Dr. Amedeo Kinsman  Admit date: 10/09/2022 Discharge date: 10/12/2022  Admitted From:  Home  Disposition: Home with [facility]   Recommendations for Outpatient Follow-up:  Follow up with PCP in 2 weeks Follow up with Dr. Amedeo Kinsman in 2 weeks for wound check up Please take aspirin 81 mg BID for DVT prophylaxis per ortho Please obtain BMP/CBC in 2 weeks  Home Health:  PT  Discharge Condition: STABLE   CODE STATUS: FULL DIET: resume prior home diet    Brief Hospitalization Summary: Please see all hospital notes, images, labs for full details of the hospitalization. ADMISSION HPI:  71 y.o. female with medical history significant of hypertension Dm2 w CKD stage3, hx of TIA,  Hypothyroidism apparently fell 1 week ago, and then yesterday felt a pop over her right hip.  Pt had increase in pain in the right hip that was worse yesterday and couldn't walk and therefore presented to ED for evaluation.  Pt had CT pelvis => displaced intertrochanteric fracture of the right hip.   Hospital Course by problem list  Displaced, R intertrochanteric fracture  POD#1 s/p ORIF 10/11/22  Postop per Dr. Amedeo Kinsman; pt recovering very well Ambulated with PT and Home health PT recommended Pain management Aspirin 81 mg BID for DVT PPX per Dr. Amedeo Kinsman Added protonix 40 mg daily for GI protection  Resume home hydrocodone/APAP for pain mgmt    Acute respiratory Failure -pt reports recent RSV infection and 5 bouts of covid -pt discharging home on supplemental oxygen  Dm2 w neuropathy Check hga1c Cont gabapentin '200mg'$  po qhs Fsbs q4, ISS CBG (last 3)  Recent Labs    10/11/22 1639 10/11/22 2041 10/12/22 0715  GLUCAP 154* 120* 88   Hypertension Cont Metoprolol '25mg'$  po qday   Hx of TIA Cont aspirin   Hypothyroidism Cont Levothyroxine 175 micrograms po qday   Discharge Diagnoses:   Principal Problem:   Closed displaced intertrochanteric fracture of right femur (HCC) Active Problems:   Leukocytosis   GERD (gastroesophageal reflux disease)   Hypothyroidism   Type 2 diabetes mellitus (HCC)   CKD (chronic kidney disease) stage 3, GFR 30-59 ml/min (HCC)   Displaced intertrochanteric fracture of right femur, init ([place])   Discharge Instructions:  Allergies as of 10/12/2022       Reactions   Ciprofloxacin Hives, Other (See Comments)   Can take levaquin with no problems per patient and family   Sulfa Antibiotics Itching        Medication List     STOP taking these medications    aspirin 325 MG tablet Replaced by: aspirin EC 81 MG tablet       TAKE these medications    acetaminophen 500 MG tablet Commonly known as: TYLENOL Take 2 tablets (1,000 mg total) by mouth every 8 (eight) hours.   albuterol 108 (90 Base) MCG/ACT inhaler Commonly known as: VENTOLIN HFA Inhale 2 puffs into the lungs every 4 (four) hours as needed for shortness of breath or wheezing.   aspirin EC 81 MG tablet Take 1 tablet (81 mg total) by mouth 2 (two) times daily. Swallow whole. Replaces: aspirin 325 MG tablet   aspirin-acetaminophen-caffeine 250-250-65 MG tablet Commonly known as: EXCEDRIN MIGRAINE Take 2 tablets by mouth every 6 (six) hours as needed for headache.   citalopram 10 MG tablet Commonly known as: CELEXA Take 10 mg by mouth at bedtime.   cyclobenzaprine 5 MG  tablet Commonly known as: FLEXERIL Take 1 tablet (5 mg total) by mouth 3 (three) times daily as needed for muscle spasms.   diazepam 5 MG tablet Commonly known as: VALIUM Take 5 mg by mouth every 4 (four) hours as needed for anxiety.   diphenoxylate-atropine 2.5-0.025 MG tablet Commonly known as: LOMOTIL Take 1 tablet by mouth as needed for diarrhea or loose stools.   Dramamine 25 MG tablet Generic drug: meclizine Take 25 mg by mouth 3 (three) times daily as needed for nausea.   gabapentin  100 MG capsule Commonly known as: NEURONTIN Take 200 mg by mouth at bedtime.   HYDROcodone-acetaminophen 5-325 MG tablet Commonly known as: NORCO/VICODIN Take 1 tablet by mouth every 6 (six) hours as needed for moderate pain.   levothyroxine 175 MCG tablet Commonly known as: SYNTHROID Take 175 mcg by mouth daily before breakfast.   Melatonin 10 MG Tabs Take 10 mg by mouth at bedtime.   metoprolol tartrate 25 MG tablet Commonly known as: LOPRESSOR Take 25 mg by mouth daily.   nitrofurantoin 50 MG capsule Commonly known as: MACRODANTIN TAKE ONE CAPSULE BY MOUTH AT BEDTIME   pantoprazole 40 MG tablet Commonly known as: PROTONIX Take 1 tablet (40 mg total) by mouth daily. Start taking on: October 13, 2022   polyethylene glycol 17 g packet Commonly known as: MIRALAX / GLYCOLAX Take 17 g by mouth daily as needed for mild constipation.   torsemide 10 MG tablet Commonly known as: DEMADEX Take 10 mg by mouth daily.               Durable Medical Equipment  (From admission, onward)           Start     Ordered   10/12/22 1025  For home use only DME oxygen  Once       Question Answer Comment  Length of Need Lifetime   Mode or (Route) Nasal cannula   Liters per Minute 3   Frequency Continuous (stationary and portable oxygen unit needed)   Oxygen conserving device Yes   Oxygen delivery system Gas      10/12/22 1024            Follow-up Information     [facility] Follow up.   Why: PT will call to set up first home visit.        Mordecai Rasmussen, MD. Schedule an appointment as soon as possible for a visit in 2 week(s).   Specialties: Orthopedic Surgery, Sports Medicine Why: Hospital Follow Up Contact information: [place]. [address] [place] [address] [phone]         Galen Manila, MD. Schedule an appointment as soon as possible for a visit in 2 week(s).   Specialty: Internal Medicine Why: Hospital Follow Up Contact  information: [id number] [place] [address] [phone]                Allergies  Allergen Reactions   Ciprofloxacin Hives and Other (See Comments)    Can take levaquin with no problems per patient and family   Sulfa Antibiotics Itching   Allergies as of 10/12/2022       Reactions   Ciprofloxacin Hives, Other (See Comments)   Can take levaquin with no problems per patient and family   Sulfa Antibiotics Itching        Medication List     STOP taking these medications    aspirin 325 MG tablet Replaced by: aspirin EC  81 MG tablet       TAKE these medications    acetaminophen 500 MG tablet Commonly known as: TYLENOL Take 2 tablets (1,000 mg total) by mouth every 8 (eight) hours.   albuterol 108 (90 Base) MCG/ACT inhaler Commonly known as: VENTOLIN HFA Inhale 2 puffs into the lungs every 4 (four) hours as needed for shortness of breath or wheezing.   aspirin EC 81 MG tablet Take 1 tablet (81 mg total) by mouth 2 (two) times daily. Swallow whole. Replaces: aspirin 325 MG tablet   aspirin-acetaminophen-caffeine 250-250-65 MG tablet Commonly known as: EXCEDRIN MIGRAINE Take 2 tablets by mouth every 6 (six) hours as needed for headache.   citalopram 10 MG tablet Commonly known as: CELEXA Take 10 mg by mouth at bedtime.   cyclobenzaprine 5 MG tablet Commonly known as: FLEXERIL Take 1 tablet (5 mg total) by mouth 3 (three) times daily as needed for muscle spasms.   diazepam 5 MG tablet Commonly known as: VALIUM Take 5 mg by mouth every 4 (four) hours as needed for anxiety.   diphenoxylate-atropine 2.5-0.025 MG tablet Commonly known as: LOMOTIL Take 1 tablet by mouth as needed for diarrhea or loose stools.   Dramamine 25 MG tablet Generic drug: meclizine Take 25 mg by mouth 3 (three) times daily as needed for nausea.   gabapentin 100 MG capsule Commonly known as: NEURONTIN Take 200 mg by mouth at bedtime.   HYDROcodone-acetaminophen  5-325 MG tablet Commonly known as: NORCO/VICODIN Take 1 tablet by mouth every 6 (six) hours as needed for moderate pain.   levothyroxine 175 MCG tablet Commonly known as: SYNTHROID Take 175 mcg by mouth daily before breakfast.   Melatonin 10 MG Tabs Take 10 mg by mouth at bedtime.   metoprolol tartrate 25 MG tablet Commonly known as: LOPRESSOR Take 25 mg by mouth daily.   nitrofurantoin 50 MG capsule Commonly known as: MACRODANTIN TAKE ONE CAPSULE BY MOUTH AT BEDTIME   pantoprazole 40 MG tablet Commonly known as: PROTONIX Take 1 tablet (40 mg total) by mouth daily. Start taking on: October 13, 2022   polyethylene glycol 17 g packet Commonly known as: MIRALAX / GLYCOLAX Take 17 g by mouth daily as needed for mild constipation.   torsemide 10 MG tablet Commonly known as: DEMADEX Take 10 mg by mouth daily.               Durable Medical Equipment  (From admission, onward)           Start     Ordered   10/12/22 1025  For home use only DME oxygen  Once       Question Answer Comment  Length of Need Lifetime   Mode or (Route) Nasal cannula   Liters per Minute 3   Frequency Continuous (stationary and portable oxygen unit needed)   Oxygen conserving device Yes   Oxygen delivery system Gas      10/12/22 1024            Procedures/Studies: DG FEMUR PORT, MIN 2 VIEWS RIGHT  Result Date: 10/11/2022 CLINICAL DATA:  Postop assessment after fixation of the right femur. EXAM: RIGHT FEMUR PORTABLE 2 VIEW COMPARISON:  Right hip radiographs dated 10/09/2022. FINDINGS: The patient is status post intramedullary nail placement and femoral neck screw across a greater trochanteric/inter trochanteric fracture. The hardware appears intact. Osseous alignment appears anatomic. No hip dislocation. IMPRESSION: Status post intramedullary nail placement across a greater trochanteric/intertrochanteric fracture. Electronically Signed   By: Dorothea Ogle  Litton M.D.   On: 10/11/2022 10:11    DG C-Arm 1-60 Min  Result Date: 10/11/2022 CLINICAL DATA:  71 year old female undergoing right femur ORIF for intertrochanteric fracture. EXAM: DG C-ARM 1-60 MIN; DG HIP (WITH OR WITHOUT PELVIS) 2-3V RIGHT CONTRAST:  None. FLUOROSCOPY: Fluoroscopy Time:  1 minute 43 seconds Radiation Exposure Index (if provided by the fluoroscopic device): 24.31 mGy Number of Acquired Spot Images: 0 COMPARISON:  Right hip CT 10/09/2022. FINDINGS: 6 intraoperative fluoroscopic spot views of the femur demonstrate and intramedullary rod placement with proximal interlocking dynamic hip screw and mid or distal interlocking cortical screw. No adverse hardware features identified. Anatomic alignment of the visible proximal femur. IMPRESSION: Intraoperative femur ORIF fluoroscopy with no adverse features identified. Electronically Signed   By: Genevie Ann M.D.   On: 10/11/2022 10:10   DG HIP UNILAT WITH PELVIS 2-3 VIEWS RIGHT  Result Date: 10/11/2022 CLINICAL DATA:  71 year old female undergoing right femur ORIF for intertrochanteric fracture. EXAM: DG C-ARM 1-60 MIN; DG HIP (WITH OR WITHOUT PELVIS) 2-3V RIGHT CONTRAST:  None. FLUOROSCOPY: Fluoroscopy Time:  1 minute 43 seconds Radiation Exposure Index (if provided by the fluoroscopic device): 24.31 mGy Number of Acquired Spot Images: 0 COMPARISON:  Right hip CT 10/09/2022. FINDINGS: 6 intraoperative fluoroscopic spot views of the femur demonstrate and intramedullary rod placement with proximal interlocking dynamic hip screw and mid or distal interlocking cortical screw. No adverse hardware features identified. Anatomic alignment of the visible proximal femur. IMPRESSION: Intraoperative femur ORIF fluoroscopy with no adverse features identified. Electronically Signed   By: Genevie Ann M.D.   On: 10/11/2022 10:10   DG Pelvis Portable  Result Date: 10/11/2022 CLINICAL DATA:  Closed fracture of the hips, postop RIGHT femoral nail. EXAM: PORTABLE PELVIS 1-2 VIEWS COMPARISON:  Femoral  evaluation of the same date. FINDINGS: Osteopenia. Post intramedullary rod placement and dynamic hip screw across a RIGHT hip fracture, incompletely assessed, see dedicated femoral evaluation of the same date for further detail. Stool and gas overlies the sacrum limiting assessment of the sacrum and bilateral iliac crest. No displaced fracture is identified about the bony pelvis. Gas present in the soft tissues overlying the RIGHT hip. IMPRESSION: 1. Post intramedullary rod placement and dynamic hip screw across a RIGHT hip fracture, incompletely assessed, see dedicated femoral evaluation of the same date for further detail. 2. No displaced fracture about the bony pelvis. Electronically Signed   By: Zetta Bills M.D.   On: 10/11/2022 10:09   CT Hip Right Wo Contrast  Result Date: 10/09/2022 CLINICAL DATA:  Hip trauma, fracture suspected, xray done EXAM: CT OF THE RIGHT HIP WITHOUT CONTRAST TECHNIQUE: Multidetector CT imaging of the right hip was performed according to the standard protocol. Multiplanar CT image reconstructions were also generated. RADIATION DOSE REDUCTION: This exam was performed according to the departmental dose-optimization program which includes automated exposure control, adjustment of the mA and/or kV according to patient size and/or use of iterative reconstruction technique. COMPARISON:  X-ray right hip 10/09/2022 FINDINGS: Bones/Joint/Cartilage Acute displaced greater trochanteric/intertrochanteric fracture (6:78). No other acute displaced fracture. No hip dislocation. No joint effusion. Ligaments Suboptimally assessed by CT. Muscles and Tendons Grossly unremarkable. Soft tissues No large hematoma formation. Subcutaneus soft tissue edema along the ischial tuberosity and lower gluteal soft tissues with associated calcifications. No organized fluid collection. Other: Colonic diverticulosis. IMPRESSION: Acute displaced greater trochanteric/intertrochanteric fracture. Electronically  Signed   By: Iven Finn M.D.   On: 10/09/2022 21:36   DG Hip Unilat  With Pelvis  2-3 Views Right  Result Date: 10/09/2022 CLINICAL DATA:  Right hip pain with difficulty bearing weight after falling 1 week ago. EXAM: DG HIP (WITH OR WITHOUT PELVIS) 2-3V RIGHT COMPARISON:  Pelvic and right hip radiographs 01/22/2017. Abdominopelvic CT 07/31/2018. FINDINGS: The bones appear demineralized. There is suboptimal evaluation of the intertrochanteric region of the right femur due to external rotation on all views. This makes exclusion of an intertrochanteric fracture difficult. The femoral neck appears intact. There is no dislocation or femoral head osteonecrosis. Degenerative changes are present in the lower lumbar spine and both hips. There are postsurgical changes at the symphysis pubis. IMPRESSION: Suboptimal evaluation of the intertrochanteric region of the right femur due to external rotation on all views. Recommend further evaluation with right hip CT. Electronically Signed   By: Richardean Sale M.D.   On: 10/09/2022 17:52     Subjective: Pt reports she has been ambulating with PT.  She is motivated to get back home today.  She is agreeable to home health. Pain is better controlled.    Discharge Exam: Vitals:   10/11/22 2342 10/12/22 0408  BP: (!) 97/38 (!) 93/40  Pulse: 73 66  Resp:  18  Temp:  98.2 F (36.8 C)  SpO2:  94%   Vitals:   10/11/22 2039 10/11/22 2342 10/12/22 0408 10/12/22 0500  BP: (!) 95/48 (!) 97/38 (!) 93/40   Pulse: 73 73 66   Resp: 20  18   Temp: 98.4 F (36.9 C)  98.2 F (36.8 C)   TempSrc: Oral  Oral   SpO2: 94%  94%   Weight:    88.6 kg  Height:       General: Pt is alert, awake, not in acute distress Cardiovascular: normal S1/S2 +, no rubs, no gallops Respiratory: CTA bilaterally, no wheezing, no rhonchi Abdominal: Soft, NT, ND, bowel sounds + Extremities: no edema, no cyanosis, bandages clean, dry, intact.    The results of significant diagnostics from  this hospitalization (including imaging, microbiology, ancillary and laboratory) are listed below for reference.     Microbiology: No results found for this or any previous visit (from the past 240 hour(s)).   Labs: BNP (last 3 results) No results for input(s): "BNP" in the last 8760 hours. Basic Metabolic Panel: Recent Labs  Lab 10/09/22 2112 10/10/22 0518 10/11/22 0304 10/12/22 0304  NA 135 138 138 136  K 4.2 4.0 3.8 4.5  CL 101 103 102 102  CO2 '26 27 29 27  '$ GLUCOSE 129* 120* 117* 104*  BUN '17 12 11 10  '$ CREATININE 0.81 0.63 0.76 0.80  CALCIUM 9.3 9.0 9.0 8.2*  MG  --   --  2.0  --    Liver Function Tests: No results for input(s): "AST", "ALT", "ALKPHOS", "BILITOT", "PROT", "ALBUMIN" in the last 168 hours. No results for input(s): "LIPASE", "AMYLASE" in the last 168 hours. No results for input(s): "AMMONIA" in the last 168 hours. CBC: Recent Labs  Lab 10/09/22 2112 10/10/22 0518 10/11/22 0304 10/12/22 0304  WBC 13.8* 9.2 7.9 11.3*  NEUTROABS 9.9*  --   --   --   HGB 14.5 14.3 13.2 10.0*  HCT 44.3 44.0 41.8 31.4*  MCV 94.3 96.3 97.2 97.5  PLT 331 291 267 241   Cardiac Enzymes: No results for input(s): "CKTOTAL", "CKMB", "CKMBINDEX", "TROPONINI" in the last 168 hours. BNP: Invalid input(s): "POCBNP" CBG: Recent Labs  Lab 10/11/22 0936 10/11/22 1122 10/11/22 1639 10/11/22 2041 10/12/22 0715  GLUCAP 135* 148* 154*  120* 88   D-Dimer No results for input(s): "DDIMER" in the last 72 hours. Hgb A1c Recent Labs    10/09/22 2112  HGBA1C 6.3*   Lipid Profile No results for input(s): "CHOL", "HDL", "LDLCALC", "TRIG", "CHOLHDL", "LDLDIRECT" in the last 72 hours. Thyroid function studies No results for input(s): "TSH", "T4TOTAL", "T3FREE", "THYROIDAB" in the last 72 hours.  Invalid input(s): "FREET3" Anemia work up No results for input(s): "VITAMINB12", "FOLATE", "FERRITIN", "TIBC", "IRON", "RETICCTPCT" in the last 72 hours. Urinalysis    Component Value  Date/Time   COLORURINE YELLOW 10/10/2022 1850   APPEARANCEUR CLEAR 10/10/2022 1850   APPEARANCEUR Clear 09/08/2020 0949   LABSPEC 1.014 10/10/2022 1850   PHURINE 5.0 10/10/2022 1850   GLUCOSEU NEGATIVE 10/10/2022 1850   HGBUR NEGATIVE 10/10/2022 1850   BILIRUBINUR NEGATIVE 10/10/2022 1850   BILIRUBINUR Negative 09/08/2020 0949   KETONESUR NEGATIVE 10/10/2022 1850   PROTEINUR NEGATIVE 10/10/2022 1850   UROBILINOGEN 0.2 03/03/2020 1043   NITRITE NEGATIVE 10/10/2022 1850   LEUKOCYTESUR NEGATIVE 10/10/2022 1850   Sepsis Labs Recent Labs  Lab 10/09/22 2112 10/10/22 0518 10/11/22 0304 10/12/22 0304  WBC 13.8* 9.2 7.9 11.3*   Microbiology No results found for this or any previous visit (from the past 240 hour(s)).  Time coordinating discharge: 35 mins  SIGNED:  Irwin Brakeman, MD  [facility] 10/12/2022, 10:45 AM How to contact the [facility] Attending or Consulting provider [place] or covering provider during after hours [place], for this patient?  Check the care team in [facility] and look for a) attending/consulting [facility] provider listed and b) the [facility] team listed Log into [email or web address] and use [place]'s universal password to access. If you do not have the password, please contact the hospital operator. Locate the [facility] provider you are looking for under [facility] and page to a number that you can be directly reached. If you still have difficulty reaching the provider, please page the [facility] (Director on Call) for the Hospitalists listed on amion for assistance.

## 2022-10-12 NOTE — Progress Notes (Signed)
   ORTHOPAEDIC PROGRESS NOTE  S/p Right Hip Cephalomedullary nail  DOS: 10/11/2022  SUBJECTIVE: A little groggy this morning.  Motivated to go home with family.  She has been trying to eat, but limited appetite.  No nausea.   OBJECTIVE: PE:  Alert and oriented.  No acute distress  Dressings are clean, dry and intact She is actively flexing her hip and her knee without pain No pain with axial loading.  Active motion intact distally Sensation intact to dorsal right foot   Vitals:   10/11/22 2342 10/12/22 0408  BP: (!) 97/38 (!) 93/40  Pulse: 73 66  Resp:  18  Temp:  98.2 F (36.8 C)  SpO2:  94%      Latest Ref Rng & Units 10/12/2022    3:04 AM 10/11/2022    3:04 AM 10/10/2022    5:18 AM  CBC  WBC 4.0 - 10.5 K/uL 11.3  7.9  9.2   Hemoglobin 12.0 - 15.0 g/dL 10.0  13.2  14.3   Hematocrit 36.0 - 46.0 % 31.4  41.8  44.0   Platelets 150 - 400 K/uL 241  267  291      ASSESSMENT: Courtney Orozco is a 71 y.o. female doing well.  Ready to work with PT.  Wants to go home.   PLAN: Weightbearing: WBAT RLE Insicional and dressing care: Reinforce dressings as needed Orthopedic device(s): None VTE prophylaxis: Recommend Aspirin '81mg'$  BID; to begin POD#1 Pain control: PO pain medications as needed; judicious use of narcotics Follow - up plan: 2 weeks.  If patient is DC to a SNF, sutures can be trimmed in 2 weeks and I can see her in clinic in 6 weeks.  If there are any questions or concerns, I can see her in clinic at any time.   Work with PT/OT.  She is doing very well following surgery    Contact information:     Anavey Coombes A. Amedeo Kinsman, MD [employer] [address] [place],  [place]  [address] Phone: [phone] Fax: [phone]

## 2022-10-12 NOTE — Discharge Instructions (Signed)
IMPORTANT INFORMATION: PAY CLOSE ATTENTION   PHYSICIAN DISCHARGE INSTRUCTIONS  Follow with Primary care provider  Galen Manila, MD  and other consultants as instructed by your Hospitalist Physician  [place] IF SYMPTOMS COME BACK, WORSEN OR NEW PROBLEM DEVELOPS   Please note: You were cared for by a hospitalist during your hospital stay. Every effort will be made to forward records to your primary care provider.  You can request that your primary care provider send for your hospital records if they have not received them.  Once you are discharged, your primary care physician will handle any further medical issues. Please note that NO REFILLS for any discharge medications will be authorized once you are discharged, as it is imperative that you return to your primary care physician (or establish a relationship with a primary care physician if you do not have one) for your post hospital discharge needs so that they can reassess your need for medications and monitor your lab values.  Please get a complete blood count and chemistry panel checked by your Primary MD at your next visit, and again as instructed by your Primary MD.  Get Medicines reviewed and adjusted: Please take all your medications with you for your next visit with your Primary MD  Laboratory/radiological data: Please request your Primary MD to go over all hospital tests and procedure/radiological results at the follow up, please ask your primary care provider to get all Hospital records sent to his/her office.  In some cases, they will be blood work, cultures and biopsy results pending at the time of your discharge. Please request that your primary care provider follow up on these results.  If you are diabetic, please bring your blood sugar readings with you to your follow up appointment with primary care.    Please call and make your follow up appointments as soon as possible.    Also Note  the following: If you experience worsening of your admission symptoms, develop shortness of breath, life threatening emergency, suicidal or homicidal thoughts you must seek medical attention immediately by calling 911 or calling your MD immediately  if symptoms less severe.  You must read complete instructions/literature along with all the possible adverse reactions/side effects for all the Medicines you take and that have been prescribed to you. Take any new Medicines after you have completely understood and accpet all the possible adverse reactions/side effects.   Do not drive when taking Pain medications or sleeping medications (Benzodiazepines)  Do not take more than prescribed Pain, Sleep and Anxiety Medications. It is not advisable to combine anxiety,sleep and pain medications without talking with your primary care practitioner  Special Instructions: If you have smoked or chewed Tobacco  in the last 2 yrs please stop smoking, stop any regular Alcohol  and or any Recreational drug use.  Wear Seat belts while driving.  Do not drive if taking any narcotic, mind altering or controlled substances or recreational drugs or alcohol.

## 2022-10-12 NOTE — Care Management Important Message (Signed)
Important Message  Patient Details  Name: Courtney Orozco MRN: [id number] Date of Birth: 06-01-1951   Medicare Important Message Given:  N/A - LOS <3 / Initial given by admissions     Courtney Orozco 10/12/2022, 1:08 PM

## 2022-10-12 NOTE — Progress Notes (Signed)
Nsg Discharge Note  Admit Date:  10/09/2022 Discharge date: 10/12/2022   ALYSANDRA LOBUE to be D/C'd Home per MD order.  AVS completed.  Patient/caregiver able to verbalize understanding.  Discharge Medication: Allergies as of 10/12/2022       Reactions   Ciprofloxacin Hives, Other (See Comments)   Can take levaquin with no problems per patient and family   Sulfa Antibiotics Itching        Medication List     STOP taking these medications    aspirin 325 MG tablet Replaced by: aspirin EC 81 MG tablet       TAKE these medications    acetaminophen 500 MG tablet Commonly known as: TYLENOL Take 2 tablets (1,000 mg total) by mouth every 8 (eight) hours.   albuterol 108 (90 Base) MCG/ACT inhaler Commonly known as: VENTOLIN HFA Inhale 2 puffs into the lungs every 4 (four) hours as needed for shortness of breath or wheezing.   aspirin EC 81 MG tablet Take 1 tablet (81 mg total) by mouth 2 (two) times daily. Swallow whole. Replaces: aspirin 325 MG tablet   aspirin-acetaminophen-caffeine 250-250-65 MG tablet Commonly known as: EXCEDRIN MIGRAINE Take 2 tablets by mouth every 6 (six) hours as needed for headache.   citalopram 10 MG tablet Commonly known as: CELEXA Take 10 mg by mouth at bedtime.   cyclobenzaprine 5 MG tablet Commonly known as: FLEXERIL Take 1 tablet (5 mg total) by mouth 3 (three) times daily as needed for muscle spasms.   diazepam 5 MG tablet Commonly known as: VALIUM Take 5 mg by mouth every 4 (four) hours as needed for anxiety.   diphenoxylate-atropine 2.5-0.025 MG tablet Commonly known as: LOMOTIL Take 1 tablet by mouth as needed for diarrhea or loose stools.   Dramamine 25 MG tablet Generic drug: meclizine Take 25 mg by mouth 3 (three) times daily as needed for nausea.   gabapentin 100 MG capsule Commonly known as: NEURONTIN Take 200 mg by mouth at bedtime.   HYDROcodone-acetaminophen 5-325 MG tablet Commonly known as:  NORCO/VICODIN Take 1 tablet by mouth every 6 (six) hours as needed for moderate pain.   levothyroxine 175 MCG tablet Commonly known as: SYNTHROID Take 175 mcg by mouth daily before breakfast.   Melatonin 10 MG Tabs Take 10 mg by mouth at bedtime.   metoprolol tartrate 25 MG tablet Commonly known as: LOPRESSOR Take 25 mg by mouth daily.   nitrofurantoin 50 MG capsule Commonly known as: MACRODANTIN TAKE ONE CAPSULE BY MOUTH AT BEDTIME   pantoprazole 40 MG tablet Commonly known as: PROTONIX Take 1 tablet (40 mg total) by mouth daily. Start taking on: October 13, 2022   polyethylene glycol 17 g packet Commonly known as: MIRALAX / GLYCOLAX Take 17 g by mouth daily as needed for mild constipation.   torsemide 10 MG tablet Commonly known as: DEMADEX Take 10 mg by mouth daily.               Durable Medical Equipment  (From admission, onward)           Start     Ordered   10/12/22 1025  For home use only DME oxygen  Once       Question Answer Comment  Length of Need Lifetime   Mode or (Route) Nasal cannula   Liters per Minute 3   Frequency Continuous (stationary and portable oxygen unit needed)   Oxygen conserving device Yes   Oxygen delivery system Gas  10/12/22 1024            Discharge Assessment: Vitals:   10/11/22 2342 10/12/22 0408  BP: (!) 97/38 (!) 93/40  Pulse: 73 66  Resp:  18  Temp:  98.2 F (36.8 C)  SpO2:  94%   Skin clean, dry and intact without evidence of skin break down, no evidence of skin tears noted. IV catheter discontinued intact. Site without signs and symptoms of complications - no redness or edema noted at insertion site, patient denies c/o pain - only slight tenderness at site.  Dressing with slight pressure applied.  D/c Instructions-Education: Discharge instructions given to patient/family with verbalized understanding. D/c education completed with patient/family including follow up instructions, medication list,  d/c activities limitations if indicated, with other d/c instructions as indicated by MD - patient able to verbalize understanding, all questions fully answered. Patient instructed to return to ED, call 911, or call MD for any changes in condition.  Patient escorted via [place], and D/C home via private auto.  Kathie Rhodes, RN 10/12/2022 11:29 AM

## 2022-10-12 NOTE — Plan of Care (Signed)
  Problem: Acute Rehab PT Goals(only PT should resolve) Goal: Patient Will Transfer Sit To/From Stand Outcome: Progressing Flowsheets (Taken 10/12/2022 0906) Patient will transfer sit to/from stand:  with min guard assist  with minimal assist Goal: Pt Will Transfer Bed To Chair/Chair To Bed Outcome: Progressing Flowsheets (Taken 10/12/2022 0906) Pt will Transfer Bed to Chair/Chair to Bed:  min guard assist  with min assist Goal: Pt Will Ambulate Outcome: Progressing Flowsheets (Taken 10/12/2022 0906) Pt will Ambulate:  100 feet  with min guard assist  with rolling walker Goal: Pt/caregiver will Perform Home Exercise Program Outcome: Progressing Flowsheets (Taken 10/12/2022 0906) Pt/caregiver will Perform Home Exercise Program:  For increased strengthening  For improved balance  Independently  9:06 AM, 10/12/22 Mearl Latin PT, DPT Physical Therapist at [facility]

## 2022-10-13 NOTE — Anesthesia Postprocedure Evaluation (Signed)
Anesthesia Post Note  Patient: Courtney Orozco  Procedure(s) Performed: INTRAMEDULLARY (IM) NAIL INTERTROCHANTERIC (Right)  Patient location during evaluation: Phase II Anesthesia Type: Spinal Level of consciousness: awake Pain management: pain level controlled Vital Signs Assessment: post-procedure vital signs reviewed and stable Respiratory status: spontaneous breathing and respiratory function stable Cardiovascular status: blood pressure returned to baseline and stable Postop Assessment: no headache and no apparent nausea or vomiting Anesthetic complications: no Comments: Late entry   No notable events documented.   Last Vitals:  Vitals:   10/12/22 0408 10/12/22 1240  BP: (!) 93/40 (!) 102/40  Pulse: 66 78  Resp: 18 20  Temp: 36.8 C 36.6 C  SpO2: 94% 95%    Last Pain:  Vitals:   10/12/22 0900  TempSrc:   PainSc: 10-Worst pain ever                 Louann Sjogren

## 2022-10-17 LAB — CBC
HCT: 31.4 % — ABNORMAL LOW (ref 36.0–46.0)
Hemoglobin: 10 g/dL — ABNORMAL LOW (ref 12.0–15.0)
MCH: 31.1 pg (ref 26.0–34.0)
MCHC: 31.8 g/dL (ref 30.0–36.0)
MCV: 97.5 fL (ref 80.0–100.0)
Platelets: 241 10*3/uL (ref 150–400)
RBC: 3.22 MIL/uL — ABNORMAL LOW (ref 3.87–5.11)
RDW: 13.1 % (ref 11.5–15.5)
WBC: 11.3 10*3/uL — ABNORMAL HIGH (ref 4.0–10.5)
nRBC: 0 % (ref 0.0–0.2)

## 2022-10-20 DIAGNOSIS — J101 Influenza due to other identified influenza virus with other respiratory manifestations: Secondary | ICD-10-CM

## 2022-10-20 HISTORY — DX: Influenza due to other identified influenza virus with other respiratory manifestations: J10.1

## 2022-10-24 ENCOUNTER — Encounter (HOSPITAL_COMMUNITY): Payer: Self-pay | Admitting: Orthopedic Surgery

## 2022-10-24 ENCOUNTER — Ambulatory Visit: Payer: PPO | Admitting: Urology

## 2022-10-24 DIAGNOSIS — N200 Calculus of kidney: Secondary | ICD-10-CM

## 2022-10-25 ENCOUNTER — Encounter: Payer: PPO | Admitting: Orthopedic Surgery

## 2022-10-31 ENCOUNTER — Ambulatory Visit (INDEPENDENT_AMBULATORY_CARE_PROVIDER_SITE_OTHER): Payer: PPO

## 2022-10-31 ENCOUNTER — Ambulatory Visit (INDEPENDENT_AMBULATORY_CARE_PROVIDER_SITE_OTHER): Payer: PPO | Admitting: Orthopedic Surgery

## 2022-10-31 ENCOUNTER — Encounter: Payer: Self-pay | Admitting: Orthopedic Surgery

## 2022-10-31 DIAGNOSIS — S72001D Fracture of unspecified part of neck of right femur, subsequent encounter for closed fracture with routine healing: Secondary | ICD-10-CM | POA: Diagnosis not present

## 2022-10-31 DIAGNOSIS — S72002D Fracture of unspecified part of neck of left femur, subsequent encounter for closed fracture with routine healing: Secondary | ICD-10-CM | POA: Diagnosis not present

## 2022-10-31 DIAGNOSIS — S72141D Displaced intertrochanteric fracture of right femur, subsequent encounter for closed fracture with routine healing: Secondary | ICD-10-CM

## 2022-10-31 NOTE — Progress Notes (Signed)
Orthopaedic Postop Note  Assessment: Courtney Orozco is a 72 y.o. female s/p cephalomedullary nail for Right intertrochanteric femur fracture  DOS: 10/11/2022  Plan: Sutures trimmed, steri strips placed Continue with protective WBAT Continue with DVT prophylaxis for at least 6 weeks after surgery WBAT on the operative extremity Follow up in 4 weeks; call with any issues   Follow-up: Return in about 4 weeks (around 11/28/2022). XR at next visit: AP pelvis and Right femur  Subjective:  Chief Complaint  Patient presents with   Post-op Follow-up    Hip fracture right IM nail 10/11/22     History of Present Illness: Courtney Orozco is a 72 y.o. female who presents following the above stated procedure.  She has been stable.  She was discharged on postop day #1.  Unfortunately, she has been sick, but is getting better.  No numbness or tingling.  No fevers or chills recently.  Review of Systems: No fevers or chills No numbness or tingling No Chest Pain + shortness of breath   Objective: There were no vitals taken for this visit.  Physical Exam:  Alert and oriented.  No acute distress.  Seated in wheelchair.  Surgical incisions are healing well.  No surrounding erythema or drainage.  Able to maintain a straight leg raise.  Active motion intact in the TA/EHL.  Toes are warm and well-perfused.   IMAGING: I personally ordered and reviewed the following images:  XR of the Right femur and AP pelvis demonstrates a well positioned cephalomedullary nail.   The intertrochanteric femur fracture remains in stable position.  There is no evidence of implant subsidence.  No acute fractures are noted.  Impression: Right intertrochanteric femur fracture in stable position without evidence   Mordecai Rasmussen, MD 10/31/2022 10:42 AM

## 2022-11-02 ENCOUNTER — Telehealth: Payer: Self-pay | Admitting: Orthopedic Surgery

## 2022-11-02 ENCOUNTER — Other Ambulatory Visit: Payer: Self-pay

## 2022-11-02 DIAGNOSIS — N200 Calculus of kidney: Secondary | ICD-10-CM

## 2022-11-02 NOTE — Telephone Encounter (Signed)
Kathlee Nations w/[facility] [phone] lvm stating that the PT assistant was w/the pt today and she is having some leg swelling, shortness of breath w/exertion, when walking her oxygen dropped to 83.  PT assistant called her family dr in [place], they wanted the pt to go the the ED, pt is refusing to go.  They are requesting an order for RN evaluation.  She would like a call back.

## 2022-11-03 ENCOUNTER — Ambulatory Visit: Payer: PPO | Admitting: Urology

## 2022-11-03 NOTE — Telephone Encounter (Signed)
Called and verbal order given to Kathlee Nations.

## 2022-11-28 ENCOUNTER — Encounter: Payer: Self-pay | Admitting: Orthopedic Surgery

## 2022-11-28 ENCOUNTER — Ambulatory Visit (INDEPENDENT_AMBULATORY_CARE_PROVIDER_SITE_OTHER): Payer: PPO

## 2022-11-28 ENCOUNTER — Ambulatory Visit (INDEPENDENT_AMBULATORY_CARE_PROVIDER_SITE_OTHER): Payer: PPO | Admitting: Orthopedic Surgery

## 2022-11-28 VITALS — Ht 63.0 in | Wt 195.0 lb

## 2022-11-28 DIAGNOSIS — S72141D Displaced intertrochanteric fracture of right femur, subsequent encounter for closed fracture with routine healing: Secondary | ICD-10-CM

## 2022-11-28 NOTE — Patient Instructions (Signed)
Continue therapy  Walking with the walker is good therapy  Remain on flat ground  Work on range of motion of the knee

## 2022-11-28 NOTE — Progress Notes (Signed)
Orthopaedic Postop Note  Assessment: Courtney Orozco is a 72 y.o. female s/p cephalomedullary nail for Right intertrochanteric femur fracture  DOS: 10/11/2022  Plan: Courtney Orozco is progressing appropriately.  Her pain is controlled.  XR are stable.  She continues to work with home health physical therapy.  I urged her to continue ambulating, with an assistive device, as this is excellent therapy.  Focus on range of motion of the right knee.  She will continue to improve.  Provided reassurance.  Nothing concerning on physical exam.   Follow-up: Return in about 6 weeks (around 01/09/2023). XR at next visit: AP pelvis and Right femur  Subjective:  Chief Complaint  Patient presents with   Routine Post Op    Rt femur DOS 10/11/22    History of Present Illness: Courtney Orozco is a 72 y.o. female who presents following the above stated procedure.  Surgery was approximately 7-8 weeks ago.  She has done well.  She is working with home health physical therapy.  She has been ambulating using a walker.  She has been working on knee range of motion.  She is doing exercises on her own, with therapy does not,.  No issues with her incisions.  No numbness or tingling.  Minimal pain.   Review of Systems: No fevers or chills No numbness or tingling No Chest Pain + shortness of breath   Objective: Ht '5\' 3"'$  (1.6 m)   Wt 195 lb (88.5 kg)   BMI 34.54 kg/m   Physical Exam:  Alert and oriented.  No acute distress.  Ambulating well with the assistance of a walker.  Surgical incisions have healed.  No surrounding erythema or drainage.  Some mild tenderness to palpation over the distal incision.  She is able to achieve full extension, and maintain a straight leg raise.  Flexion to 95 degrees.  No pain with axial loading.  Sensation is intact distally.  Mild swelling in the foot and ankle.    IMAGING: I personally ordered and reviewed the following images:  AP pelvis and right femur x-rays were  obtained in clinic today.  No acute injuries noted.  Well-positioned cephalomedullary nail.  No change in the overall position.  No lucencies around the implants.  No screw backout.  No screw cut out.  Limited views of the right knee demonstrate no acute injuries.  Mild degenerative changes overall.  Impression: Well-positioned right hip cephalomedullary nail for a healing intertrochanteric femur fracture  Mordecai Rasmussen, MD 11/28/2022 8:55 AM

## 2022-12-21 ENCOUNTER — Encounter: Payer: Self-pay | Admitting: Radiology

## 2023-01-09 ENCOUNTER — Other Ambulatory Visit: Payer: Self-pay

## 2023-01-09 ENCOUNTER — Encounter: Payer: Self-pay | Admitting: Orthopedic Surgery

## 2023-01-09 ENCOUNTER — Ambulatory Visit (INDEPENDENT_AMBULATORY_CARE_PROVIDER_SITE_OTHER): Payer: PPO | Admitting: Orthopedic Surgery

## 2023-01-09 ENCOUNTER — Other Ambulatory Visit (INDEPENDENT_AMBULATORY_CARE_PROVIDER_SITE_OTHER): Payer: PPO

## 2023-01-09 DIAGNOSIS — S72141D Displaced intertrochanteric fracture of right femur, subsequent encounter for closed fracture with routine healing: Secondary | ICD-10-CM

## 2023-01-09 DIAGNOSIS — M25561 Pain in right knee: Secondary | ICD-10-CM | POA: Diagnosis not present

## 2023-01-09 NOTE — Progress Notes (Signed)
Orthopaedic Postop Note  Assessment: Courtney Orozco is a 72 y.o. female s/p cephalomedullary nail for Right intertrochanteric femur fracture  DOS: 10/11/2022  Plan: Courtney Orozco states that her right hip is doing very well.  Radiographs are stable.  However, she is having progressively worsening pain in her right knee.  Dr. Aline Brochure completed arthroscopic surgery less than a year ago.  Pictures from the surgery demonstrates degenerative changes throughout the knee.  I offered her steroid injection in clinic today, and she elected to proceed.  This was completed without issues.  I will see her back in approximately 3 months for repeat evaluation of the right hip.  She will see Dr. Aline Brochure in the interim.  Procedure note injection Right knee joint   Verbal consent was obtained to inject the right knee joint  Timeout was completed to confirm the site of injection.  The skin was prepped with alcohol and ethyl chloride was sprayed at the injection site.  A 21-gauge needle was used to inject 40 mg of Depo-Medrol and 1% lidocaine (4 cc) into the right knee using an anterolateral approach.  There were no complications. A sterile bandage was applied.    Follow-up: Return in about 3 months (around 04/11/2023). XR at next visit: AP pelvis and Right femur  Subjective:  Chief Complaint  Patient presents with   Routine Post Op    Rt hip DOS 10/11/22  NDC: [id number]    History of Present Illness: Courtney Orozco is a 72 y.o. female who presents following the above stated procedure.  Surgery was approximately 3 months ago.  Her right hip is doing well.  She denies pain in her right hip or groin.  No issues with her incisions.  She is using a walker to assist with ambulation.  However, she does report that she has ongoing knee pain.  This is limiting her mobility.  She has had knee pain in the past, and had surgery with Dr. Aline Brochure last summer.  Review of Systems: No fevers or chills No numbness  or tingling No Chest Pain + shortness of breath   Objective: There were no vitals taken for this visit.  Physical Exam:  Alert and oriented.  No acute distress.  Ambulating with the assistance of a walker.  Lateral base surgical incisions have healed.  No surrounding erythema or drainage.  No tenderness to palpation.  She tolerates gentle range of motion of the right hip without pain.  No pain with axial loading.  She does have diffuse tenderness to palpation within the right knee.  Limited ability to achieve full extension due to pain.  Minimal swelling.    IMAGING: I personally ordered and reviewed the following images:  AP pelvis and right femur x-rays were obtained in clinic today.  No acute injuries are noted.  Cephalomedullary nail is in good position.  No change in overall alignment.  Fracture has healed.  No evidence of the screws backing out.  No screw cut out.  Limited views of the right knee demonstrates diffuse degenerative changes including loss of joint space, as well as some associated osteophytes.  Impression: Healed right intertrochanteric femur fracture without hardware failure.  Mordecai Rasmussen, MD 01/09/2023 10:46 AM

## 2023-01-09 NOTE — Addendum Note (Signed)
Addended by: Larena Glassman A on: 01/09/2023 01:51 PM   Modules accepted: Level of Service

## 2023-01-09 NOTE — Patient Instructions (Signed)

## 2023-02-22 ENCOUNTER — Ambulatory Visit: Payer: PPO | Admitting: Orthopedic Surgery

## 2023-02-23 ENCOUNTER — Ambulatory Visit: Payer: PPO | Admitting: Orthopedic Surgery

## 2023-02-23 ENCOUNTER — Other Ambulatory Visit (INDEPENDENT_AMBULATORY_CARE_PROVIDER_SITE_OTHER): Payer: PPO

## 2023-02-23 ENCOUNTER — Encounter: Payer: Self-pay | Admitting: Orthopedic Surgery

## 2023-02-23 ENCOUNTER — Ambulatory Visit (INDEPENDENT_AMBULATORY_CARE_PROVIDER_SITE_OTHER): Payer: PPO | Admitting: Orthopedic Surgery

## 2023-02-23 ENCOUNTER — Other Ambulatory Visit: Payer: Self-pay | Admitting: Radiology

## 2023-02-23 DIAGNOSIS — M25561 Pain in right knee: Secondary | ICD-10-CM | POA: Diagnosis not present

## 2023-02-23 DIAGNOSIS — M5410 Radiculopathy, site unspecified: Secondary | ICD-10-CM

## 2023-02-23 DIAGNOSIS — G8929 Other chronic pain: Secondary | ICD-10-CM

## 2023-02-23 DIAGNOSIS — M1711 Unilateral primary osteoarthritis, right knee: Secondary | ICD-10-CM | POA: Diagnosis not present

## 2023-02-23 DIAGNOSIS — M545 Low back pain, unspecified: Secondary | ICD-10-CM

## 2023-02-23 NOTE — Patient Instructions (Signed)
While we are working on your approval for MRI please go ahead and call to schedule your appointment with [facility] within at least one (1) week.   Central Scheduling [phone]  

## 2023-02-23 NOTE — Progress Notes (Signed)
This is a follow-up appointment  Encounter Diagnoses  Name Primary?   Chronic pain of right knee Yes   Radicular pain of right lower extremity    Lumbar pain determined by palpation      Chief Complaint  Patient presents with   Knee Pain    Right knee pain. Inhibiting her ADLs    72 year old female with severe right knee pain  Courtney Orozco had arthroscopy of the right knee July 2023 we did a partial lateral meniscectomy she had arthritis grade 3 laterally grade 2 medially grade 2 in the patellofemoral area  She subsequently underwent cephalic medullary nailing of the right hip for hip fracture with a long nail  She is never really become pain-free in her right knee.  Prior to the knee arthroscopy the knee was giving way and it was thought to be secondary to the meniscal tear  However the patient has had send full back surgeries has been in chronic pain management and had giving way symptoms of the right leg which persist  She does indicate that her back pain improved after the hip surgery  However, she is in severe pain in the right knee despite taking 800 of ibuprofen 100 of gabapentin and intermittent hydrocodone from the pain management Center in [place]  She has had multiple epidural injections in the past and does not want to do that again as she got worse  However, her examination today reveals tenderness around the knee joint small joint effusion painful range of motion especially trying to extend the knee she has excessive valgus alignment in the knee and her back is tender  Her imaging shows lateral compartment arthrosis grade 3 increased valgus alignment  Her spine x-ray also shows degenerative disc disease and acute angle at the L5-S1 junction with lordosis  Assessment and plan  She says her back does not hurt but is extremely tender and I would reproduce some of her pain by palpating her spine although her knee is tender it does not match her arthroscopy pictures  or knee films  I feel like her symptoms are out of proportion to her x-rays and I am going to do an MRI of her spine to see if she has spinal stenosis in the L3-L4 area  She will come back after MRI  She has several medical problems as well and is in no condition to undergo knee replacement surgery  Patient Active Problem List   Diagnosis Date Noted   Displaced intertrochanteric fracture of right femur, init (HCC) 10/10/2022   Closed displaced intertrochanteric fracture of right femur (HCC) 10/10/2022   Complex tear of lateral meniscus of right knee as current injury    Insomnia 04/26/2022   Chronic cystitis with hematuria 09/08/2020   Calculus of gallbladder without cholecystitis without obstruction 08/13/2018   Infection due to ESBL-producing Escherichia coli 07/08/2018   Sepsis secondary to UTI (HCC) 05/22/2018   GERD (gastroesophageal reflux disease) 05/22/2018   Hypothyroidism 05/22/2018   Type 2 diabetes mellitus (HCC) 05/22/2018   Anxiety and depression 05/22/2018   HCAP (healthcare-associated pneumonia) 05/22/2018   Chronic back pain 05/22/2018   Bacteremia 10/01/2017   Acute metabolic encephalopathy 09/30/2017   Shortness of breath 09/30/2017   ESBL E Coli- UTI (urinary tract infection) 09/28/2017   Leukocytosis 09/28/2017   Right ureteral stone    Frequent falls 08/15/2017   Hypotension due to drugs 02/16/2017   Syncope 01/22/2017   CKD (chronic kidney disease) stage 3, GFR 30-59 ml/min (HCC) 10/22/2015  Nephrolithiasis 04/07/2015   Renal stone 04/06/2015   Intertrigo 03/26/2015   Fasting hyperglycemia 01/25/2015   Controlled substance agreement signed 09/09/2013   Hyperlipidemia 12/27/2010   Hypertension 02/04/2010   History of colonic polyps 01/08/2001   Family history of malignant neoplasm of gastrointestinal tract 09/05/2000   Chronic hepatitis B with hepatic coma (HCC) 12/15/1999   Edema 08/09/1999

## 2023-02-28 ENCOUNTER — Ambulatory Visit: Payer: PPO | Admitting: Orthopedic Surgery

## 2023-03-06 ENCOUNTER — Ambulatory Visit: Payer: PPO | Admitting: Orthopedic Surgery

## 2023-03-07 ENCOUNTER — Telehealth: Payer: Self-pay | Admitting: Orthopedic Surgery

## 2023-03-07 ENCOUNTER — Other Ambulatory Visit (INDEPENDENT_AMBULATORY_CARE_PROVIDER_SITE_OTHER): Payer: PPO

## 2023-03-07 ENCOUNTER — Encounter: Payer: Self-pay | Admitting: Orthopedic Surgery

## 2023-03-07 ENCOUNTER — Ambulatory Visit (INDEPENDENT_AMBULATORY_CARE_PROVIDER_SITE_OTHER): Payer: PPO | Admitting: Orthopedic Surgery

## 2023-03-07 DIAGNOSIS — S72141D Displaced intertrochanteric fracture of right femur, subsequent encounter for closed fracture with routine healing: Secondary | ICD-10-CM

## 2023-03-07 NOTE — Telephone Encounter (Signed)
Patient states she wants to get her records and she wants to get a disk of her images of her right  knee. She states she feels her pain is from her knee she denies lumbar pain  To Renee for the disk  I told her you will call her when images are ready for her to pick up   To Toniann Fail, not sure who I need to send record request to. Patient will sign release and give to front desk

## 2023-03-07 NOTE — Progress Notes (Signed)
Orthopaedic Postop Note  Assessment: Courtney Orozco is a 72 y.o. female s/p cephalomedullary nail for Right intertrochanteric femur fracture  DOS: 10/11/2022  Plan: Courtney Orozco states that her right hip is doing very well.  She has no issues in her right hip.  She continues to have pain in the right knee.  She has been seeing Dr. Romeo Apple for her right knee, as well as her lower back.  I previously injected her right knee, and she had 60-70% improvement.  Reviewed radiographs with her in clinic today.  She does have some signs of degenerative changes.  She states that she has a second opinion for her right knee scheduled later this month.  Regarding her right hip, I have no concerns.  Radiographs look very good.  She denies pain in her right hip or groin area.  She also states that her low back pain has gotten better since surgery on her right hip.  At this point, she will return to clinic as needed for repeat evaluation of the right hip.    Follow-up: Return if symptoms worsen or fail to improve. XR at next visit: AP pelvis and Right femur  Subjective:  Chief Complaint  Patient presents with   Hip Injury    Right hip fracture surgery 10/21/22 patient has done well after hip surgery     History of Present Illness: Courtney Orozco is a 72 y.o. female who presents following the above stated procedure.  Surgery was approximately 4+ months ago.  She has no pain in her right hip.  No pain in her groin.  No issues with her surgical incisions.  She continues to use a walker to assist with ambulation.  She notes issues with her balance, which have been going on for a while.  She has been seeing Dr. Romeo Apple for her right knee, and some back issues.  I injected her right knee recently, and she notes some improvement in her symptoms.  She states it got better about 60-70%.  review of Systems: No fevers or chills No numbness or tingling No Chest Pain + shortness of breath   Objective: There were  no vitals taken for this visit.  Physical Exam:  Alert and oriented.  No acute distress.  Ambulating with the assistance of a walker.  Lateral hip incisions have healed.  No surrounding erythema or drainage.  No tenderness to palpation.  She tolerates gentle range of motion of the right hip.  No pain in her groin.  She has some limited motion in the right knee.  Some tenderness to palpation along the medial joint line.    IMAGING: I personally ordered and reviewed the following images:  AP pelvis and right femur x-rays were obtained in clinic today.  No acute injuries are noted there has been no change in the position of cephalomedullary nail.  No screw cut out.  Fracture line is not visible.  No subsidence of the fracture of the implants.  No adverse features.  No bony lesions.  Right knee is visible, and demonstrates moderate degenerative changes, with some loss of joint space.  Impression: Healed right intertrochanteric femur fracture without subsidence or failure of the implants.  Oliver Barre, MD 03/07/2023 11:00 AM

## 2023-03-08 ENCOUNTER — Ambulatory Visit: Payer: PPO | Admitting: Orthopedic Surgery

## 2023-03-21 ENCOUNTER — Ambulatory Visit (HOSPITAL_COMMUNITY): Admission: RE | Admit: 2023-03-21 | Payer: PPO | Source: Ambulatory Visit

## 2023-03-29 ENCOUNTER — Ambulatory Visit: Payer: PPO | Admitting: Orthopedic Surgery

## 2023-04-08 ENCOUNTER — Other Ambulatory Visit: Payer: Self-pay | Admitting: Orthopedic Surgery

## 2023-04-08 ENCOUNTER — Other Ambulatory Visit: Payer: Self-pay | Admitting: Urology

## 2023-04-13 NOTE — Progress Notes (Signed)
Sent message, via epic in basket, requesting orders in epic from surgeon.  

## 2023-04-19 NOTE — Patient Instructions (Addendum)
SURGICAL WAITING ROOM VISITATION Patients having surgery or a procedure may have no more than 2 support people in the waiting area - these visitors may rotate.    Children under the age of 71 must have an adult with them who is not the patient.  If the patient needs to stay at the hospital during part of their recovery, the visitor guidelines for inpatient rooms apply. Pre-op nurse will coordinate an appropriate time for 1 support person to accompany patient in pre-op.  This support person may not rotate.    Please refer to the [facility] website for the visitor guidelines for Inpatients (after your surgery is over and you are in a regular room).       Your procedure is scheduled on: 05-01-23   Report to [facility] Main Entrance    Report to admitting at 7:30 AM   Call this number if you have problems the morning of surgery [phone]   Do not eat food :After Midnight.   After Midnight you may have the following liquids until 7:00 AM DAY OF SURGERY  Water Non-Citrus Juices (without pulp, NO RED-Apple, White grape, White cranberry) Black Coffee (NO MILK/CREAM OR CREAMERS, sugar ok)  Clear Tea (NO MILK/CREAM OR CREAMERS, sugar ok) regular and decaf                             Plain Jell-O (NO RED)                                           Fruit ices (not with fruit pulp, NO RED)                                     Popsicles (NO RED)                                                               Sports drinks like Gatorade (NO RED)                   The day of surgery:  Drink ONE (1) Pre-Surgery G2 at 7:00  AM the morning of surgery. Drink in one sitting. Do not sip.  This drink was given to you during your hospital  pre-op appointment visit. Nothing else to drink after completing the Pre-Surgery G2.          If you have questions, please contact your surgeon's office.   FOLLOW  ANY ADDITIONAL PRE OP INSTRUCTIONS YOU RECEIVED FROM YOUR SURGEON'S OFFICE!!!      Oral Hygiene is also important to reduce your risk of infection.                                    Remember - BRUSH YOUR TEETH THE MORNING OF SURGERY WITH YOUR REGULAR TOOTHPASTE   Do NOT smoke after Midnight   Take these medicines the morning of surgery with A SIP OF WATER:   Citalopram  Diazepam  Levothyroxine  Metoprolol  Pantoprazole  Tylenol if needed  Hydrocodone if needed  Okay to use inhalers                              You may not have any metal on your body including hair pins, jewelry, and body piercing             Do not wear make-up, lotions, powders, perfumes or deodorant  Do not wear nail polish including gel and S&S, artificial/acrylic nails, or any other type of covering on natural nails including finger and toenails. If you have artificial nails, gel coating, etc. that needs to be removed by a nail salon please have this removed prior to surgery or surgery may need to be canceled/ delayed if the surgeon/ anesthesia feels like they are unable to be safely monitored.   Do not shave  48 hours prior to surgery.    Do not bring valuables to the hospital. [facility] IS NOT RESPONSIBLE   FOR VALUABLES.   Contacts, dentures or bridgework may not be worn into surgery.   Bring small overnight bag day of surgery.   DO NOT BRING YOUR HOME MEDICATIONS TO THE HOSPITAL. PHARMACY WILL DISPENSE MEDICATIONS LISTED ON YOUR MEDICATION LIST TO YOU DURING YOUR ADMISSION IN THE HOSPITAL!   Please read over the following fact sheets you were given: IF YOU HAVE QUESTIONS ABOUT YOUR PRE-OP INSTRUCTIONS PLEASE CALL [phone] Courtney Orozco  If you received a COVID test during your pre-op visit  it is requested that you wear a mask when out in public, stay away from anyone that may not be feeling well and notify your surgeon if you develop symptoms. If you test positive for Covid or have been in contact with anyone that has tested positive in the last 10 days please notify you  surgeon.   Pre-operative 5 CHG Bath Instructions   You can play a key role in reducing the risk of infection after surgery. Your skin needs to be as free of germs as possible. You can reduce the number of germs on your skin by washing with CHG (chlorhexidine gluconate) soap before surgery. CHG is an antiseptic soap that kills germs and continues to kill germs even after washing.   DO NOT use if you have an allergy to chlorhexidine/CHG or antibacterial soaps. If your skin becomes reddened or irritated, stop using the CHG and notify one of our RNs at  [phone] .   Please shower with the CHG soap starting 4 days before surgery using the following schedule:     Please keep in mind the following:  DO NOT shave, including legs and underarms, starting the day of your first shower.   You may shave your face at any point before/day of surgery.  Place clean sheets on your bed the day you start using CHG soap. Use a clean washcloth (not used since being washed) for each shower. DO NOT sleep with pets once you start using the CHG.   CHG Shower Instructions:  If you choose to wash your hair and private area, wash first with your normal shampoo/soap.  After you use shampoo/soap, rinse your hair and body thoroughly to remove shampoo/soap residue.  Turn the water OFF and apply about 3 tablespoons (45 ml) of CHG soap to a CLEAN washcloth.  Apply CHG soap ONLY FROM YOUR NECK DOWN TO YOUR TOES (washing for 3-5 minutes)  DO NOT use CHG soap on  face, private areas, open wounds, or sores.  Pay special attention to the area where your surgery is being performed.  If you are having back surgery, having someone wash your back for you may be helpful. Wait 2 minutes after CHG soap is applied, then you may rinse off the CHG soap.  Pat dry with a clean towel  Put on clean clothes/pajamas   If you choose to wear lotion, please use ONLY the CHG-compatible lotions on the back of this paper.     Additional  instructions for the day of surgery: DO NOT APPLY any lotions, deodorants, cologne, or perfumes.   Put on clean/comfortable clothes.  Brush your teeth.  Ask your nurse before applying any prescription medications to the skin.      CHG Compatible Lotions   Aveeno Moisturizing lotion  Cetaphil Moisturizing Cream  Cetaphil Moisturizing Lotion  Clairol Herbal Essence Moisturizing Lotion, Dry Skin  Clairol Herbal Essence Moisturizing Lotion, Extra Dry Skin  Clairol Herbal Essence Moisturizing Lotion, Normal Skin  Curel Age Defying Therapeutic Moisturizing Lotion with Alpha Hydroxy  Curel Extreme Care Body Lotion  Curel Soothing Hands Moisturizing Hand Lotion  Curel Therapeutic Moisturizing Cream, Fragrance-Free  Curel Therapeutic Moisturizing Lotion, Fragrance-Free  Curel Therapeutic Moisturizing Lotion, Original Formula  Eucerin Daily Replenishing Lotion  Eucerin Dry Skin Therapy Plus Alpha Hydroxy Crme  Eucerin Dry Skin Therapy Plus Alpha Hydroxy Lotion  Eucerin Original Crme  Eucerin Original Lotion  Eucerin Plus Crme Eucerin Plus Lotion  Eucerin TriLipid Replenishing Lotion  Keri Anti-Bacterial Hand Lotion  Keri Deep Conditioning Original Lotion Dry Skin Formula Softly Scented  Keri Deep Conditioning Original Lotion, Fragrance Free Sensitive Skin Formula  Keri Lotion Fast Absorbing Fragrance Free Sensitive Skin Formula  Keri Lotion Fast Absorbing Softly Scented Dry Skin Formula  Keri Original Lotion  Keri Skin Renewal Lotion Keri Silky Smooth Lotion  Keri Silky Smooth Sensitive Skin Lotion  Nivea Body Creamy Conditioning Oil  Nivea Body Extra Enriched Lotion  Nivea Body Original Lotion  Nivea Body Sheer Moisturizing Lotion Nivea Crme  Nivea Skin Firming Lotion  NutraDerm 30 Skin Lotion  NutraDerm Skin Lotion  NutraDerm Therapeutic Skin Cream  NutraDerm Therapeutic Skin Lotion  ProShield Protective Hand Cream  Provon moisturizing lotion   PATIENT  SIGNATURE_________________________________  NURSE SIGNATURE__________________________________  ________________________________________________________________________    Courtney Orozco  An incentive spirometer is a tool that can help keep your lungs clear and active. This tool measures how well you are filling your lungs with each breath. Taking long deep breaths may help reverse or decrease the chance of developing breathing (pulmonary) problems (especially infection) following: A long period of time when you are unable to move or be active. BEFORE THE PROCEDURE  If the spirometer includes an indicator to show your best effort, your nurse or respiratory therapist will set it to a desired goal. If possible, sit up straight or lean slightly forward. Try not to slouch. Hold the incentive spirometer in an upright position. INSTRUCTIONS FOR USE  Sit on the edge of your bed if possible, or sit up as far as you can in bed or on a chair. Hold the incentive spirometer in an upright position. Breathe out normally. Place the mouthpiece in your mouth and seal your lips tightly around it. Breathe in slowly and as deeply as possible, raising the piston or the ball toward the top of the column. Hold your breath for 3-5 seconds or for as long as possible. Allow the piston or ball to fall to  the bottom of the column. Remove the mouthpiece from your mouth and breathe out normally. Rest for a few seconds and repeat Steps 1 through 7 at least 10 times every 1-2 hours when you are awake. Take your time and take a few normal breaths between deep breaths. The spirometer may include an indicator to show your best effort. Use the indicator as a goal to work toward during each repetition. After each set of 10 deep breaths, practice coughing to be sure your lungs are clear. If you have an incision (the cut made at the time of surgery), support your incision when coughing by placing a pillow or rolled up towels  firmly against it. Once you are able to get out of bed, walk around indoors and cough well. You may stop using the incentive spirometer when instructed by your caregiver.  RISKS AND COMPLICATIONS Take your time so you do not get dizzy or light-headed. If you are in pain, you may need to take or ask for pain medication before doing incentive spirometry. It is harder to take a deep breath if you are having pain. AFTER USE Rest and breathe slowly and easily. It can be helpful to keep track of a log of your progress. Your caregiver can provide you with a simple table to help with this. If you are using the spirometer at home, follow these instructions: SEEK MEDICAL CARE IF:  You are having difficultly using the spirometer. You have trouble using the spirometer as often as instructed. Your pain medication is not giving enough relief while using the spirometer. You develop fever of 100.5 F (38.1 C) or higher. SEEK IMMEDIATE MEDICAL CARE IF:  You cough up bloody sputum that had not been present before. You develop fever of 102 F (38.9 C) or greater. You develop worsening pain at or near the incision site. MAKE SURE YOU:  Understand these instructions. Will watch your condition. Will get help right away if you are not doing well or get worse. Document Released: 02/19/2007 Document Revised: 01/01/2012 Document Reviewed: 04/22/2007 [facility] Patient Information 2014 [place], [place].   ________________________________________________________________________

## 2023-04-19 NOTE — Progress Notes (Addendum)
COVID Vaccine Completed:  Yes  Date of COVID positive in last 90 days:  No  PCP - Early Chars, MD Cardiologist -   Medical clearance on chart dated 03-26-23 by Dr. Melvyn Neth (moderate risk)  Chest x-ray - 10-20-22 [facility] EKG - 10-20-22 [facility].  Copy on chart Stress Test -  ECHO -  Cardiac Cath -  Pacemaker/ICD device last checked: Spinal Cord Stimulator:  Bowel Prep - N/A  Sleep Study - N/A CPAP -   CBG 101 at PAT Fasting Blood Sugar -  Checks Blood Sugar - does not check   Last dose of GLP1 agonist-  N/A GLP1 instructions:  N/A   Last dose of SGLT-2 inhibitors-  N/A SGLT-2 instructions: N/A  Blood Thinner Instructions:  Time Aspirin Instructions:  ASA 81.  Last Dose: 04-16-23  Activity level:  Can go up a flight of stairs and perform activities of daily living without stopping and without symptoms of chest pain or shortness of breath.  Sme limitations due to knee pain.    Anesthesia review:  Pt states that she had the flu 10/20/22 and following that had low oxygen sats with shortness of breath for a few months.  States that oxygen was prescribed at 3L    Patient states that the shortness of breath has resolved and she no longer uses oxygen.  She has recently cut back on smoking and is down to one to two cigarettes every couple of days.   Potassium 2.9 on PAT labs  Also states that he sats sometimes drop after anesthesia and she is hard to wake up.  Patient does not want a spinal for surgery due to previous back surgery.  Patient denies shortness of breath, fever, cough and chest pain at PAT appointment  Patient verbalized understanding of instructions that were given to them at the PAT appointment. Patient was also instructed that they will need to review over the PAT instructions again at home before surgery.

## 2023-04-20 ENCOUNTER — Encounter (HOSPITAL_COMMUNITY)
Admission: RE | Admit: 2023-04-20 | Discharge: 2023-04-20 | Disposition: A | Discharge status: Home / Self Care | Payer: PPO | Source: Ambulatory Visit | Attending: Orthopedic Surgery | Admitting: Orthopedic Surgery

## 2023-04-20 ENCOUNTER — Other Ambulatory Visit: Payer: Self-pay

## 2023-04-20 ENCOUNTER — Encounter (HOSPITAL_COMMUNITY): Payer: Self-pay

## 2023-04-20 VITALS — BP 159/73 | HR 55 | Temp 98.3°F | Resp 12 | Ht 63.0 in | Wt 161.0 lb

## 2023-04-20 DIAGNOSIS — K759 Inflammatory liver disease, unspecified: Secondary | ICD-10-CM

## 2023-04-20 DIAGNOSIS — Z01818 Encounter for other preprocedural examination: Secondary | ICD-10-CM

## 2023-04-20 DIAGNOSIS — E119 Type 2 diabetes mellitus without complications: Secondary | ICD-10-CM

## 2023-04-20 LAB — COMPREHENSIVE METABOLIC PANEL
ALT: 13 U/L (ref 0–44)
AST: 16 U/L (ref 15–41)
Albumin: 3.7 g/dL (ref 3.5–5.0)
Alkaline Phosphatase: 50 U/L (ref 38–126)
Anion gap: 7 (ref 5–15)
BUN: 8 mg/dL (ref 8–23)
CO2: 29 mmol/L (ref 22–32)
Calcium: 8.3 mg/dL — ABNORMAL LOW (ref 8.9–10.3)
Chloride: 102 mmol/L (ref 98–111)
Creatinine, Ser: 0.69 mg/dL (ref 0.44–1.00)
GFR, Estimated: >60 mL/min (ref >60)
Glucose, Bld: 99 mg/dL (ref 70–99)
Potassium: 2.9 mmol/L — ABNORMAL LOW (ref 3.5–5.1)
Sodium: 138 mmol/L (ref 135–145)
Total Bilirubin: 0.6 mg/dL (ref 0.3–1.2)
Total Protein: 6.8 g/dL (ref 6.5–8.1)

## 2023-04-20 LAB — CBC
HCT: 42.5 % (ref 36.0–46.0)
Hemoglobin: 13.6 g/dL (ref 12.0–15.0)
MCH: 30.4 pg (ref 26.0–34.0)
MCHC: 32 g/dL (ref 30.0–36.0)
MCV: 94.9 fL (ref 80.0–100.0)
Platelets: 311 10*3/uL (ref 150–400)
RBC: 4.48 MIL/uL (ref 3.87–5.11)
RDW: 13.9 % (ref 11.5–15.5)
WBC: 7.2 10*3/uL (ref 4.0–10.5)
nRBC: 0 % (ref 0.0–0.2)

## 2023-04-20 LAB — GLUCOSE, CAPILLARY: Glucose-Capillary: 101 mg/dL — ABNORMAL HIGH (ref 70–99)

## 2023-04-20 LAB — SURGICAL PCR SCREEN
MRSA, PCR: NEGATIVE
Staphylococcus aureus: NEGATIVE

## 2023-04-30 NOTE — Anesthesia Preprocedure Evaluation (Signed)
Anesthesia Evaluation  Patient identified by MRN, date of birth, ID band Patient awake    Reviewed: Allergy & Precautions, NPO status , Patient's Chart, lab work & pertinent test results, reviewed documented beta blocker date and time   History of Anesthesia Complications (+) PONV and history of anesthetic complications  Airway Mallampati: II  TM Distance: >3 FB Neck ROM: Full    Dental  (+) Edentulous Lower, Edentulous Upper   Pulmonary Current Smoker and Patient abstained from smoking.   Pulmonary exam normal        Cardiovascular hypertension, Pt. on medications and Pt. on home beta blockers Normal cardiovascular exam     Neuro/Psych   Anxiety Depression    TIA   GI/Hepatic Neg liver ROS,GERD  Medicated and Controlled,,  Endo/Other  diabetes, Type 2Hypothyroidism    Renal/GU Renal InsufficiencyRenal disease  negative genitourinary   Musculoskeletal  (+) Arthritis ,    Abdominal   Peds  Hematology negative hematology ROS (+)   Anesthesia Other Findings Day of surgery medications reviewed with patient.  Reproductive/Obstetrics                              Anesthesia Physical Anesthesia Plan  ASA: 2  Anesthesia Plan: General   Post-op Pain Management: Tylenol PO (pre-op)* and Regional block*   Induction: Intravenous  PONV Risk Score and Plan: 4 or greater and Treatment may vary due to age or medical condition, Ondansetron, Propofol infusion, Dexamethasone, Midazolam and TIVA  Airway Management Planned: LMA  Additional Equipment: None  Intra-op Plan:   Post-operative Plan: Extubation in OR  Informed Consent: I have reviewed the patients History and Physical, chart, labs and discussed the procedure including the risks, benefits and alternatives for the proposed anesthesia with the patient or authorized representative who has indicated his/her understanding and acceptance.      Dental advisory given  Plan Discussed with: CRNA  Anesthesia Plan Comments:         Anesthesia Quick Evaluation

## 2023-04-30 NOTE — H&P (Signed)
TOTAL KNEE ADMISSION H&P  Patient is being admitted for right total knee arthroplasty.  Therapy Plans: outpatient therapy at [facility] PT Disposition: Home with family & husband (daughter & son 29, 73 years old) Planned DVT Prophylaxis: aspirin 81mg  BID DME needed: walker PCP: Dr. Melvyn Neth, clearance received TXA: IV Allergies: sulfa - , cipro - Anesthesia Concerns: none BMI: 28.6 Last HgbA1c: Not diabetic - 6.4%   Other: - smoker - has decreased/quit - abx x7 days - Takes hydrocodone 5 mg every 6 hours for baseline pain -- oxycodone after surgery - flexeril, tylenol - No hx of VTE or cancer - no hx of stomach ulcer/ GI bleed   Subjective:  Chief Complaint:right knee pain.  HPI: Courtney Orozco, 72 y.o. female, has a history of pain and functional disability in the right knee due to arthritis and has failed non-surgical conservative treatments for greater than 12 weeks to includeNSAID's and/or analgesics, corticosteriod injections, and activity modification.  Onset of symptoms was gradual, starting 2 years ago with gradually worsening course since that time. The patient noted no past surgery on the right knee(s).  Patient currently rates pain in the right knee(s) at 8 out of 10 with activity. Patient has worsening of pain with activity and weight bearing, pain that interferes with activities of daily living, and pain with passive range of motion.  Patient has evidence of joint space narrowing by imaging studies.  There is no active infection.  Patient Active Problem List   Diagnosis Date Noted   Displaced intertrochanteric fracture of right femur, init (HCC) 10/10/2022   Closed displaced intertrochanteric fracture of right femur (HCC) 10/10/2022   Complex tear of lateral meniscus of right knee as current injury    Insomnia 04/26/2022   Chronic cystitis with hematuria 09/08/2020   Calculus of gallbladder without cholecystitis without obstruction 08/13/2018   Infection due to  ESBL-producing Escherichia coli 07/08/2018   Sepsis secondary to UTI (HCC) 05/22/2018   GERD (gastroesophageal reflux disease) 05/22/2018   Hypothyroidism 05/22/2018   Type 2 diabetes mellitus (HCC) 05/22/2018   Anxiety and depression 05/22/2018   HCAP (healthcare-associated pneumonia) 05/22/2018   Chronic back pain 05/22/2018   Bacteremia 10/01/2017   Acute metabolic encephalopathy 09/30/2017   Shortness of breath 09/30/2017   ESBL E Coli- UTI (urinary tract infection) 09/28/2017   Leukocytosis 09/28/2017   Right ureteral stone    Frequent falls 08/15/2017   Hypotension due to drugs 02/16/2017   Syncope 01/22/2017   CKD (chronic kidney disease) stage 3, GFR 30-59 ml/min (HCC) 10/22/2015   Nephrolithiasis 04/07/2015   Renal stone 04/06/2015   Intertrigo 03/26/2015   Fasting hyperglycemia 01/25/2015   Controlled substance agreement signed 09/09/2013   Hyperlipidemia 12/27/2010   Hypertension 02/04/2010   History of colonic polyps 01/08/2001   Family history of malignant neoplasm of gastrointestinal tract 09/05/2000   Chronic hepatitis B with hepatic coma (HCC) 12/15/1999   Edema 08/09/1999   Past Medical History:  Diagnosis Date   Anxiety    Arthritis    Back pain    Depression    Diabetes mellitus without complication (HCC)    no meds in 5 years    Gait instability    GERD (gastroesophageal reflux disease)    Headache    hx of migraines    Hepatitis    hx of hep B - 44 years ago    History of kidney stones    Hypertension    Hypothyroidism    Influenza A 10/20/2022  Memory loss    Pneumonia    hx of 01/2015    PONV (postoperative nausea and vomiting)    pt states "the last two times ive been put to sleep, my oxygen drops low and they have trouble waking me up.   Thyroid disease    TIA (transient ischemic attack)     Past Surgical History:  Procedure Laterality Date   ABDOMINAL HYSTERECTOMY     APPENDECTOMY     BACK SURGERY     x 4   bladder tack surgery       BREAST SURGERY Right    removal of 3 benign tumors   CATARACT EXTRACTION W/PHACO Left 04/30/2017   Procedure: CATARACT EXTRACTION PHACO AND INTRAOCULAR LENS PLACEMENT (IOC);  Surgeon: Gemma Payor, MD;  Location: [facility];  Service: Ophthalmology;  Laterality: Left;  CDE: 7.16   CATARACT EXTRACTION W/PHACO Right 05/14/2017   Procedure: CATARACT EXTRACTION PHACO AND INTRAOCULAR LENS PLACEMENT RIGHT EYE;  Surgeon: Gemma Payor, MD;  Location: [facility];  Service: Ophthalmology;  Laterality: Right;  CDE: 6.63   CHOLECYSTECTOMY N/A 08/21/2018   Procedure: LAPAROSCOPIC CHOLECYSTECTOMY;  Surgeon: Lucretia Roers, MD;  Location: [facility];  Service: General;  Laterality: N/A;   CYSTOSCOPY WITH STENT PLACEMENT Right 09/28/2017   Procedure: CYSTOSCOPY WITH STENT PLACEMENT;  Surgeon: Marcine Matar, MD;  Location: [facility];  Service: Urology;  Laterality: Right;   CYSTOSCOPY WITH URETEROSCOPY, STONE BASKETRY AND STENT PLACEMENT Right 04/06/2015   Procedure: CYSTOSCOPY WITH RIGHT URETEROSCOPY, STONE BASKETRY AND STENT PLACEMENT;  Surgeon: Bjorn Pippin, MD;  Location: [facility];  Service: Urology;  Laterality: Right;   CYSTOSCOPY/RETROGRADE/URETEROSCOPY Right 11/28/2017   Procedure: CYSTOSCOPY,  REMOVAL OF RIGHT URETERAL STENT, RIGHT RETROGRADE PYELOGRAM, RIGHT URETEROSCOPY, STONE BASKET EXTRACTION RIGHT URETERAL CALCULUS;  Surgeon: Malen Gauze, MD;  Location: [facility];  Service: Urology;  Laterality: Right;   HOLMIUM LASER APPLICATION Right 04/06/2015   Procedure: HOLMIUM LASER APPLICATION;  Surgeon: Bjorn Pippin, MD;  Location: [facility];  Service: Urology;  Laterality: Right;   INTRAMEDULLARY (IM) NAIL INTERTROCHANTERIC Right 10/11/2022   Procedure: INTRAMEDULLARY (IM) NAIL INTERTROCHANTERIC;  Surgeon: Oliver Barre, MD;  Location: [facility];  Service: Orthopedics;  Laterality: Right;   KNEE ARTHROSCOPY WITH LATERAL MENISECTOMY Right 05/09/2022   Procedure: KNEE ARTHROSCOPY WITH LATERAL MENISECTOMY;  Surgeon:  Vickki Hearing, MD;  Location: [facility];  Service: Orthopedics;  Laterality: Right;   KNEE SURGERY     left   ROTATOR CUFF REPAIR Left    thumb surgery     left   VAGINA SURGERY      No current facility-administered medications for this encounter.   Current Outpatient Medications  Medication Sig Dispense Refill Last Dose   acetaminophen (TYLENOL) 500 MG tablet Take 2 tablets (1,000 mg total) by mouth every 8 (eight) hours. (Patient taking differently: Take 1,000 mg by mouth every 8 (eight) hours as needed for moderate pain.) 30 tablet 0    albuterol (PROVENTIL) (2.5 MG/3ML) 0.083% nebulizer solution Take 2.5 mg by nebulization every 6 (six) hours as needed for wheezing or shortness of breath.      albuterol (VENTOLIN HFA) 108 (90 Base) MCG/ACT inhaler Inhale 2 puffs into the lungs every 4 (four) hours as needed for shortness of breath or wheezing.      aspirin EC 81 MG tablet Take 1 tablet (81 mg total) by mouth 2 (two) times daily. Swallow whole. (Patient taking differently: Take 81 mg by mouth daily. Swallow whole.) 60 tablet  0    B Complex-C (SUPER B COMPLEX PO) Take 1 capsule by mouth daily.      chlorpheniramine (CHLOR-TRIMETON) 4 MG tablet Take 4 mg by mouth daily as needed for allergies.      citalopram (CELEXA) 20 MG tablet Take 20 mg by mouth daily.      cyclobenzaprine (FLEXERIL) 5 MG tablet TAKE 1 TABLET BY MOUTH 3 TIMES DAILY AS NEEDED FOR MUSCLE SPAMS (Patient taking differently: Take 5 mg by mouth See admin instructions. Take 5 mg every night, may take an additional 5 mg dose during the day as needed for muscle spasms) 60 tablet 5    denosumab (PROLIA) 60 MG/ML SOSY injection Inject 60 mg into the skin every 6 (six) months.      diazepam (VALIUM) 5 MG tablet Take 5 mg by mouth every 8 (eight) hours as needed for anxiety.      furosemide (LASIX) 40 MG tablet Take 40 mg by mouth at bedtime.      gabapentin (NEURONTIN) 100 MG capsule Take 200 mg by mouth at bedtime.       HYDROcodone-acetaminophen (NORCO/VICODIN) 5-325 MG tablet Take 1 tablet by mouth every 6 (six) hours as needed for moderate pain.      ibuprofen (ADVIL) 200 MG tablet Take 800 mg by mouth every 6 (six) hours as needed for moderate pain.      levothyroxine (SYNTHROID) 150 MCG tablet Take 150 mcg by mouth daily before breakfast.      Melatonin 5 MG TBDP Take 20 mg by mouth at bedtime.      metoprolol tartrate (LOPRESSOR) 25 MG tablet Take 25 mg by mouth 2 (two) times daily.      Multiple Vitamin (MULTIVITAMIN WITH MINERALS) TABS tablet Take 1 tablet by mouth daily.      nitrofurantoin (MACRODANTIN) 50 MG capsule TAKE ONE CAPSULE BY MOUTH AT BEDTIME 90 capsule 3    pantoprazole (PROTONIX) 40 MG tablet Take 1 tablet (40 mg total) by mouth daily. 30 tablet 0    Allergies  Allergen Reactions   Ciprofloxacin Hives and Other (See Comments)    Can take levaquin with no problems per patient and family   Sulfa Antibiotics Itching    Social History   Tobacco Use   Smoking status: Some Days    Packs/day: 0.25    Years: 50.00    Additional pack years: 0.00    Total pack years: 12.50    Types: Cigarettes   Smokeless tobacco: Never  Substance Use Topics   Alcohol use: No    Family History  Problem Relation Age of Onset   COPD Father      Review of Systems  Constitutional:  Negative for chills and fever.  Respiratory:  Negative for cough and shortness of breath.   Cardiovascular:  Negative for chest pain.  Gastrointestinal:  Negative for nausea and vomiting.  Musculoskeletal:  Positive for arthralgias.     Objective:  Physical Exam Well nourished and well developed. General: Alert and oriented x3, cooperative and pleasant, no acute distress. Head: normocephalic, atraumatic, neck supple. Eyes: EOMI.  Musculoskeletal: Right knee exam: No palpable effusion, warmth erythema Hesitation with full knee extension with discomfort over the anterior lateral aspect knee Tenderness laterally  and anteriorly Passively correctable valgus with discomfort Flexion over 110 degrees with tightness No significant lower extremity edema, erythema or calf tenderness   Calves soft and nontender. Motor function intact in LE. Strength 5/5 LE bilaterally. Neuro: Distal pulses 2+. Sensation to  light touch intact in LE.  Vital signs in last 24 hours:    Labs:   Estimated body mass index is 28.52 kg/m as calculated from the following:   Height as of 04/20/23: 5\' 3"  (1.6 m).   Weight as of 04/20/23: 73 kg.   Imaging Review Plain radiographs demonstrate severe degenerative joint disease of the right knee(s). The overall alignment isneutral. The bone quality appears to be adequate for age and reported activity level.      Assessment/Plan:  End stage arthritis, right knee   The patient history, physical examination, clinical judgment of the provider and imaging studies are consistent with end stage degenerative joint disease of the right knee(s) and total knee arthroplasty is deemed medically necessary. The treatment options including medical management, injection therapy arthroscopy and arthroplasty were discussed at length. The risks and benefits of total knee arthroplasty were presented and reviewed. The risks due to aseptic loosening, infection, stiffness, patella tracking problems, thromboembolic complications and other imponderables were discussed. The patient acknowledged the explanation, agreed to proceed with the plan and consent was signed. Patient is being admitted for inpatient treatment for surgery, pain control, PT, OT, prophylactic antibiotics, VTE prophylaxis, progressive ambulation and ADL's and discharge planning. The patient is planning to be discharged  home.     Patient's anticipated LOS is less than 2 midnights, meeting these requirements: - Younger than 67 - Lives within 1 hour of care - Has a competent adult at home to recover with post-op recover - NO history  of  - Chronic pain requiring opiods  - Diabetes  - Coronary Artery Disease  - Heart failure  - Heart attack  - Stroke  - DVT/VTE  - Cardiac arrhythmia  - Respiratory Failure/COPD  - Renal failure  - Anemia  - Advanced Liver disease  Rosalene Billings, PA-C Orthopedic Surgery [facility] [place] [phone]

## 2023-05-01 ENCOUNTER — Encounter (HOSPITAL_COMMUNITY): Payer: Self-pay | Admitting: Orthopedic Surgery

## 2023-05-01 ENCOUNTER — Ambulatory Visit (HOSPITAL_COMMUNITY): Payer: PPO | Admitting: Physician Assistant

## 2023-05-01 ENCOUNTER — Ambulatory Visit (HOSPITAL_BASED_OUTPATIENT_CLINIC_OR_DEPARTMENT_OTHER): Payer: PPO | Admitting: Anesthesiology

## 2023-05-01 ENCOUNTER — Other Ambulatory Visit: Payer: Self-pay

## 2023-05-01 ENCOUNTER — Encounter (HOSPITAL_COMMUNITY)
Admission: RE | Disposition: A | Discharge status: Home / Self Care | Payer: Self-pay | Source: Ambulatory Visit | Attending: Orthopedic Surgery

## 2023-05-01 ENCOUNTER — Observation Stay (HOSPITAL_COMMUNITY)
Admission: RE | Admit: 2023-05-01 | Discharge: 2023-05-02 | Disposition: A | Discharge status: Home / Self Care | Payer: PPO | Source: Ambulatory Visit | Attending: Orthopedic Surgery | Admitting: Orthopedic Surgery

## 2023-05-01 DIAGNOSIS — N183 Chronic kidney disease, stage 3 unspecified: Secondary | ICD-10-CM | POA: Diagnosis not present

## 2023-05-01 DIAGNOSIS — Z7982 Long term (current) use of aspirin: Secondary | ICD-10-CM | POA: Insufficient documentation

## 2023-05-01 DIAGNOSIS — M1711 Unilateral primary osteoarthritis, right knee: Secondary | ICD-10-CM | POA: Diagnosis present

## 2023-05-01 DIAGNOSIS — I129 Hypertensive chronic kidney disease with stage 1 through stage 4 chronic kidney disease, or unspecified chronic kidney disease: Secondary | ICD-10-CM | POA: Diagnosis not present

## 2023-05-01 DIAGNOSIS — E119 Type 2 diabetes mellitus without complications: Secondary | ICD-10-CM | POA: Diagnosis not present

## 2023-05-01 DIAGNOSIS — F17210 Nicotine dependence, cigarettes, uncomplicated: Secondary | ICD-10-CM | POA: Insufficient documentation

## 2023-05-01 DIAGNOSIS — Z96651 Presence of right artificial knee joint: Secondary | ICD-10-CM

## 2023-05-01 DIAGNOSIS — Z79899 Other long term (current) drug therapy: Secondary | ICD-10-CM | POA: Diagnosis not present

## 2023-05-01 DIAGNOSIS — E039 Hypothyroidism, unspecified: Secondary | ICD-10-CM | POA: Insufficient documentation

## 2023-05-01 DIAGNOSIS — Z8673 Personal history of transient ischemic attack (TIA), and cerebral infarction without residual deficits: Secondary | ICD-10-CM | POA: Insufficient documentation

## 2023-05-01 HISTORY — PX: TOTAL KNEE ARTHROPLASTY: SHX125

## 2023-05-01 SURGERY — ARTHROPLASTY, KNEE, TOTAL
Anesthesia: General | Site: Knee | Laterality: Right

## 2023-05-01 MED ORDER — OXYCODONE HCL 5 MG PO TABS
10.0000 mg | ORAL_TABLET | ORAL | Status: DC | PRN
  Administered 2023-05-01 – 2023-05-02 (×3): 10 mg via ORAL
  Filled 2023-05-01: qty 2

## 2023-05-01 MED ORDER — MELATONIN 5 MG PO TABS
20.0000 mg | ORAL_TABLET | Freq: Every day | ORAL | Status: DC
  Administered 2023-05-01: 20 mg via ORAL
  Filled 2023-05-01: qty 4

## 2023-05-01 MED ORDER — EPINEPHRINE PF 1 MG/ML IJ SOLN
INTRAMUSCULAR | Status: AC
  Filled 2023-05-01: qty 1

## 2023-05-01 MED ORDER — BUPIVACAINE-EPINEPHRINE (PF) 0.5% -1:200000 IJ SOLN
INTRAMUSCULAR | Status: DC | PRN
  Administered 2023-05-01: 15 mL via PERINEURAL

## 2023-05-01 MED ORDER — LACTATED RINGERS IV SOLN: INTRAVENOUS | Status: DC

## 2023-05-01 MED ORDER — LIDOCAINE 2% (20 MG/ML) 5 ML SYRINGE
INTRAMUSCULAR | Status: DC | PRN
  Administered 2023-05-01: 100 mg via INTRAVENOUS

## 2023-05-01 MED ORDER — METHOCARBAMOL 500 MG PO TABS: 500.0000 mg | ORAL_TABLET | Freq: Four times a day (QID) | ORAL | Status: DC | PRN

## 2023-05-01 MED ORDER — ONDANSETRON HCL 4 MG/2ML IJ SOLN: 4.0000 mg | Freq: Four times a day (QID) | INTRAMUSCULAR | Status: DC | PRN

## 2023-05-01 MED ORDER — OXYCODONE HCL 5 MG PO TABS
5.0000 mg | ORAL_TABLET | ORAL | Status: DC | PRN
  Administered 2023-05-02: 10 mg via ORAL
  Filled 2023-05-01 (×3): qty 2

## 2023-05-01 MED ORDER — DIPHENHYDRAMINE HCL 12.5 MG/5ML PO ELIX: 12.5000 mg | ORAL_SOLUTION | ORAL | Status: DC | PRN

## 2023-05-01 MED ORDER — DEXAMETHASONE SODIUM PHOSPHATE 10 MG/ML IJ SOLN
INTRAMUSCULAR | Status: AC
  Filled 2023-05-01: qty 1

## 2023-05-01 MED ORDER — CLONIDINE HCL (ANALGESIA) 100 MCG/ML EP SOLN
EPIDURAL | Status: DC | PRN
  Administered 2023-05-01: 100 ug

## 2023-05-01 MED ORDER — DIAZEPAM 5 MG PO TABS: 5.0000 mg | ORAL_TABLET | Freq: Three times a day (TID) | ORAL | Status: DC | PRN

## 2023-05-01 MED ORDER — CEFAZOLIN SODIUM-DEXTROSE 2-4 GM/100ML-% IV SOLN
2.0000 g | Freq: Four times a day (QID) | INTRAVENOUS | Status: AC
  Administered 2023-05-01 (×2): 2 g via INTRAVENOUS
  Filled 2023-05-01 (×2): qty 100

## 2023-05-01 MED ORDER — SODIUM CHLORIDE (PF) 0.9 % IJ SOLN
INTRAMUSCULAR | Status: AC
  Filled 2023-05-01: qty 50

## 2023-05-01 MED ORDER — FENTANYL CITRATE PF 50 MCG/ML IJ SOSY
50.0000 ug | PREFILLED_SYRINGE | INTRAMUSCULAR | Status: DC
  Administered 2023-05-01: 50 ug via INTRAVENOUS
  Filled 2023-05-01: qty 2

## 2023-05-01 MED ORDER — CHLORHEXIDINE GLUCONATE 0.12 % MT SOLN
15.0000 mL | Freq: Once | OROMUCOSAL | Status: AC
  Administered 2023-05-01: 15 mL via OROMUCOSAL

## 2023-05-01 MED ORDER — FENTANYL CITRATE (PF) 100 MCG/2ML IJ SOLN
INTRAMUSCULAR | Status: AC
  Filled 2023-05-01: qty 2

## 2023-05-01 MED ORDER — BUPIVACAINE-EPINEPHRINE (PF) 0.25% -1:200000 IJ SOLN
INTRAMUSCULAR | Status: DC | PRN
  Administered 2023-05-01: 30 mL

## 2023-05-01 MED ORDER — CITALOPRAM HYDROBROMIDE 20 MG PO TABS
20.0000 mg | ORAL_TABLET | Freq: Every day | ORAL | Status: DC
  Administered 2023-05-02: 20 mg via ORAL
  Filled 2023-05-01: qty 1

## 2023-05-01 MED ORDER — SODIUM CHLORIDE (PF) 0.9 % IJ SOLN
INTRAMUSCULAR | Status: DC | PRN
  Administered 2023-05-01: 30 mL

## 2023-05-01 MED ORDER — CEFAZOLIN SODIUM-DEXTROSE 2-4 GM/100ML-% IV SOLN
2.0000 g | INTRAVENOUS | Status: AC
  Administered 2023-05-01: 2 g via INTRAVENOUS
  Filled 2023-05-01: qty 100

## 2023-05-01 MED ORDER — PROPOFOL 500 MG/50ML IV EMUL
INTRAVENOUS | Status: DC | PRN
  Administered 2023-05-01: 100 ug/kg/min via INTRAVENOUS

## 2023-05-01 MED ORDER — TRANEXAMIC ACID-NACL 1000-0.7 MG/100ML-% IV SOLN
1000.0000 mg | Freq: Once | INTRAVENOUS | Status: AC
  Administered 2023-05-01: 1000 mg via INTRAVENOUS
  Filled 2023-05-01: qty 100

## 2023-05-01 MED ORDER — AMISULPRIDE (ANTIEMETIC) 5 MG/2ML IV SOLN: 10.0000 mg | Freq: Once | INTRAVENOUS | Status: DC | PRN

## 2023-05-01 MED ORDER — SODIUM CHLORIDE 0.9 % IV SOLN: INTRAVENOUS | Status: DC

## 2023-05-01 MED ORDER — POVIDONE-IODINE 10 % EX SWAB: 2.0000 | Freq: Once | CUTANEOUS | Status: DC

## 2023-05-01 MED ORDER — DOCUSATE SODIUM 100 MG PO CAPS
100.0000 mg | ORAL_CAPSULE | Freq: Two times a day (BID) | ORAL | Status: DC
  Administered 2023-05-01 – 2023-05-02 (×2): 100 mg via ORAL
  Filled 2023-05-01 (×2): qty 1

## 2023-05-01 MED ORDER — LIDOCAINE HCL (PF) 2 % IJ SOLN
INTRAMUSCULAR | Status: AC
  Filled 2023-05-01: qty 5

## 2023-05-01 MED ORDER — POLYETHYLENE GLYCOL 3350 17 G PO PACK
17.0000 g | PACK | Freq: Two times a day (BID) | ORAL | Status: DC
  Administered 2023-05-01 – 2023-05-02 (×2): 17 g via ORAL
  Filled 2023-05-01 (×2): qty 1

## 2023-05-01 MED ORDER — PROPOFOL 10 MG/ML IV BOLUS
INTRAVENOUS | Status: AC
  Filled 2023-05-01: qty 20

## 2023-05-01 MED ORDER — METOPROLOL TARTRATE 25 MG PO TABS
25.0000 mg | ORAL_TABLET | Freq: Two times a day (BID) | ORAL | Status: DC
  Administered 2023-05-02: 25 mg via ORAL
  Filled 2023-05-01 (×2): qty 1

## 2023-05-01 MED ORDER — FUROSEMIDE 40 MG PO TABS
40.0000 mg | ORAL_TABLET | Freq: Every day | ORAL | Status: DC
  Administered 2023-05-02: 40 mg via ORAL
  Filled 2023-05-01: qty 1

## 2023-05-01 MED ORDER — MIDAZOLAM HCL 2 MG/2ML IJ SOLN
1.0000 mg | INTRAMUSCULAR | Status: DC
  Administered 2023-05-01: 1 mg via INTRAVENOUS
  Filled 2023-05-01: qty 2

## 2023-05-01 MED ORDER — METHOCARBAMOL 500 MG IVPB - SIMPLE MED
INTRAVENOUS | Status: AC
  Filled 2023-05-01: qty 55

## 2023-05-01 MED ORDER — DEXAMETHASONE SODIUM PHOSPHATE 10 MG/ML IJ SOLN
10.0000 mg | Freq: Once | INTRAMUSCULAR | Status: AC
  Administered 2023-05-02: 10 mg via INTRAVENOUS
  Filled 2023-05-01: qty 1

## 2023-05-01 MED ORDER — PHENYLEPHRINE HCL-NACL 20-0.9 MG/250ML-% IV SOLN
INTRAVENOUS | Status: DC | PRN
  Administered 2023-05-01: 50 ug/min via INTRAVENOUS

## 2023-05-01 MED ORDER — METOCLOPRAMIDE HCL 5 MG/ML IJ SOLN: 5.0000 mg | Freq: Three times a day (TID) | INTRAMUSCULAR | Status: DC | PRN

## 2023-05-01 MED ORDER — HYDROMORPHONE HCL 1 MG/ML IJ SOLN: 0.5000 mg | INTRAMUSCULAR | Status: DC | PRN

## 2023-05-01 MED ORDER — PROPOFOL 500 MG/50ML IV EMUL
INTRAVENOUS | Status: AC
  Filled 2023-05-01: qty 50

## 2023-05-01 MED ORDER — FENTANYL CITRATE PF 50 MCG/ML IJ SOSY
PREFILLED_SYRINGE | INTRAMUSCULAR | Status: AC
  Filled 2023-05-01: qty 1

## 2023-05-01 MED ORDER — SODIUM CHLORIDE 0.9 % IR SOLN
Status: DC | PRN
  Administered 2023-05-01: 1000 mL

## 2023-05-01 MED ORDER — PROPOFOL 10 MG/ML IV BOLUS
INTRAVENOUS | Status: DC | PRN
  Administered 2023-05-01: 30 mg via INTRAVENOUS
  Administered 2023-05-01: 150 mg via INTRAVENOUS
  Administered 2023-05-01: 20 mg via INTRAVENOUS

## 2023-05-01 MED ORDER — METHOCARBAMOL 500 MG IVPB - SIMPLE MED
500.0000 mg | Freq: Four times a day (QID) | INTRAVENOUS | Status: DC | PRN
  Administered 2023-05-01: 500 mg via INTRAVENOUS

## 2023-05-01 MED ORDER — DEXAMETHASONE SODIUM PHOSPHATE 10 MG/ML IJ SOLN
8.0000 mg | Freq: Once | INTRAMUSCULAR | Status: AC
  Administered 2023-05-01: 8 mg via INTRAVENOUS

## 2023-05-01 MED ORDER — EPHEDRINE SULFATE-NACL 50-0.9 MG/10ML-% IV SOSY
PREFILLED_SYRINGE | INTRAVENOUS | Status: DC | PRN
  Administered 2023-05-01: 10 mg via INTRAVENOUS

## 2023-05-01 MED ORDER — CYCLOBENZAPRINE HCL 5 MG PO TABS: 5.0000 mg | ORAL_TABLET | ORAL | Status: DC

## 2023-05-01 MED ORDER — ORAL CARE MOUTH RINSE: 15.0000 mL | Freq: Once | OROMUCOSAL | Status: AC

## 2023-05-01 MED ORDER — TRANEXAMIC ACID-NACL 1000-0.7 MG/100ML-% IV SOLN
1000.0000 mg | INTRAVENOUS | Status: AC
  Administered 2023-05-01: 1000 mg via INTRAVENOUS
  Filled 2023-05-01: qty 100

## 2023-05-01 MED ORDER — ACETAMINOPHEN 500 MG PO TABS
1000.0000 mg | ORAL_TABLET | Freq: Four times a day (QID) | ORAL | Status: DC
  Administered 2023-05-01 – 2023-05-02 (×4): 1000 mg via ORAL
  Filled 2023-05-01 (×4): qty 2

## 2023-05-01 MED ORDER — OXYCODONE HCL 5 MG PO TABS
ORAL_TABLET | ORAL | Status: AC
  Filled 2023-05-01: qty 1

## 2023-05-01 MED ORDER — NITROFURANTOIN MACROCRYSTAL 50 MG PO CAPS
50.0000 mg | ORAL_CAPSULE | Freq: Every day | ORAL | Status: DC
  Administered 2023-05-01: 50 mg via ORAL
  Filled 2023-05-01: qty 1

## 2023-05-01 MED ORDER — BUPIVACAINE HCL 0.25 % IJ SOLN
INTRAMUSCULAR | Status: AC
  Filled 2023-05-01: qty 1

## 2023-05-01 MED ORDER — PROPOFOL 1000 MG/100ML IV EMUL
INTRAVENOUS | Status: AC
  Filled 2023-05-01: qty 100

## 2023-05-01 MED ORDER — ONDANSETRON HCL 4 MG/2ML IJ SOLN
INTRAMUSCULAR | Status: AC
  Filled 2023-05-01: qty 2

## 2023-05-01 MED ORDER — GABAPENTIN 100 MG PO CAPS
200.0000 mg | ORAL_CAPSULE | Freq: Every day | ORAL | Status: DC
  Administered 2023-05-01: 200 mg via ORAL
  Filled 2023-05-01: qty 2

## 2023-05-01 MED ORDER — ALBUTEROL SULFATE (2.5 MG/3ML) 0.083% IN NEBU: 2.5000 mg | INHALATION_SOLUTION | Freq: Four times a day (QID) | RESPIRATORY_TRACT | Status: DC | PRN

## 2023-05-01 MED ORDER — EPHEDRINE 5 MG/ML INJ
INTRAVENOUS | Status: AC
  Filled 2023-05-01: qty 5

## 2023-05-01 MED ORDER — KETOROLAC TROMETHAMINE 30 MG/ML IJ SOLN
INTRAMUSCULAR | Status: DC | PRN
  Administered 2023-05-01: 30 mg

## 2023-05-01 MED ORDER — OXYCODONE HCL 5 MG/5ML PO SOLN: 5.0000 mg | Freq: Once | ORAL | Status: AC | PRN

## 2023-05-01 MED ORDER — ONDANSETRON HCL 4 MG/2ML IJ SOLN
INTRAMUSCULAR | Status: DC | PRN
  Administered 2023-05-01: 4 mg via INTRAVENOUS

## 2023-05-01 MED ORDER — KETOROLAC TROMETHAMINE 30 MG/ML IJ SOLN
INTRAMUSCULAR | Status: AC
  Filled 2023-05-01: qty 1

## 2023-05-01 MED ORDER — PHENOL 1.4 % MT LIQD: 1.0000 | OROMUCOSAL | Status: DC | PRN

## 2023-05-01 MED ORDER — FENTANYL CITRATE (PF) 100 MCG/2ML IJ SOLN
INTRAMUSCULAR | Status: DC | PRN
  Administered 2023-05-01 (×2): 50 ug via INTRAVENOUS

## 2023-05-01 MED ORDER — MENTHOL 3 MG MT LOZG: 1.0000 | LOZENGE | OROMUCOSAL | Status: DC | PRN

## 2023-05-01 MED ORDER — STERILE WATER FOR IRRIGATION IR SOLN
Status: DC | PRN
  Administered 2023-05-01: 2000 mL

## 2023-05-01 MED ORDER — OXYCODONE HCL 5 MG PO TABS
5.0000 mg | ORAL_TABLET | Freq: Once | ORAL | Status: AC | PRN
  Administered 2023-05-01: 5 mg via ORAL

## 2023-05-01 MED ORDER — ASPIRIN 81 MG PO CHEW
81.0000 mg | CHEWABLE_TABLET | Freq: Two times a day (BID) | ORAL | Status: DC
  Administered 2023-05-01 – 2023-05-02 (×2): 81 mg via ORAL
  Filled 2023-05-01 (×2): qty 1

## 2023-05-01 MED ORDER — ACETAMINOPHEN 500 MG PO TABS
1000.0000 mg | ORAL_TABLET | Freq: Once | ORAL | Status: AC
  Administered 2023-05-01: 1000 mg via ORAL
  Filled 2023-05-01: qty 2

## 2023-05-01 MED ORDER — ALBUTEROL SULFATE HFA 108 (90 BASE) MCG/ACT IN AERS: 2.0000 | INHALATION_SPRAY | RESPIRATORY_TRACT | Status: DC | PRN

## 2023-05-01 MED ORDER — PANTOPRAZOLE SODIUM 40 MG PO TBEC
40.0000 mg | DELAYED_RELEASE_TABLET | Freq: Every day | ORAL | Status: DC
  Administered 2023-05-01: 40 mg via ORAL
  Filled 2023-05-01: qty 1

## 2023-05-01 MED ORDER — 0.9 % SODIUM CHLORIDE (POUR BTL) OPTIME
TOPICAL | Status: DC | PRN
  Administered 2023-05-01: 1000 mL

## 2023-05-01 MED ORDER — FENTANYL CITRATE PF 50 MCG/ML IJ SOSY
PREFILLED_SYRINGE | INTRAMUSCULAR | Status: AC
  Administered 2023-05-01: 50 ug via INTRAVENOUS
  Filled 2023-05-01: qty 2

## 2023-05-01 MED ORDER — FENTANYL CITRATE PF 50 MCG/ML IJ SOSY
25.0000 ug | PREFILLED_SYRINGE | INTRAMUSCULAR | Status: DC | PRN
  Administered 2023-05-01 (×2): 50 ug via INTRAVENOUS

## 2023-05-01 MED ORDER — METOCLOPRAMIDE HCL 5 MG PO TABS: 5.0000 mg | ORAL_TABLET | Freq: Three times a day (TID) | ORAL | Status: DC | PRN

## 2023-05-01 MED ORDER — BISACODYL 10 MG RE SUPP: 10.0000 mg | Freq: Every day | RECTAL | Status: DC | PRN

## 2023-05-01 MED ORDER — LEVOTHYROXINE SODIUM 75 MCG PO TABS
150.0000 ug | ORAL_TABLET | Freq: Every day | ORAL | Status: DC
  Administered 2023-05-02: 150 ug via ORAL
  Filled 2023-05-01: qty 2

## 2023-05-01 MED ORDER — ONDANSETRON HCL 4 MG PO TABS: 4.0000 mg | ORAL_TABLET | Freq: Four times a day (QID) | ORAL | Status: DC | PRN

## 2023-05-01 SURGICAL SUPPLY — 58 items
ADH SKN CLS APL DERMABOND .7 (GAUZE/BANDAGES/DRESSINGS) ×1
ATTUNE MED ANAT PAT 35 KNEE (Knees) IMPLANT
BAG COUNTER SPONGE SURGICOUNT (BAG) IMPLANT
BAG SPEC THK2 15X12 ZIP CLS (MISCELLANEOUS)
BAG SPNG CNTER NS LX DISP (BAG)
BAG ZIPLOCK 12X15 (MISCELLANEOUS) IMPLANT
BASEPLATE TIB CMT FB PCKT SZ5 (Knees) IMPLANT
BLADE SAW SGTL 11.0X1.19X90.0M (BLADE) IMPLANT
BLADE SAW SGTL 13.0X1.19X90.0M (BLADE) ×2 IMPLANT
BNDG CMPR 5X62 HK CLSR LF (GAUZE/BANDAGES/DRESSINGS) ×1
BNDG CMPR 6"X 5 YARDS HK CLSR (GAUZE/BANDAGES/DRESSINGS) ×1
BNDG CMPR MED 10X6 ELC LF (GAUZE/BANDAGES/DRESSINGS) ×1
BNDG ELASTIC 6INX 5YD STR LF (GAUZE/BANDAGES/DRESSINGS) ×2 IMPLANT
BNDG ELASTIC 6X10 VLCR STRL LF (GAUZE/BANDAGES/DRESSINGS) IMPLANT
BOWL SMART MIX CTS (DISPOSABLE) ×2 IMPLANT
BSPLAT TIB 5 CMNT FXBRNG STRL (Knees) ×1 IMPLANT
CEMENT HV SMART SET (Cement) IMPLANT
COMP FEM CMT ATTUNE KNEE 5 RT (Joint) ×1 IMPLANT
COMPONENT FEM CMT ATTN KN 5 RT (Joint) IMPLANT
COVER SURGICAL LIGHT HANDLE (MISCELLANEOUS) ×2 IMPLANT
CUFF TOURN SGL QUICK 34 (TOURNIQUET CUFF) ×1
CUFF TRNQT CYL 34X4.125X (TOURNIQUET CUFF) ×2 IMPLANT
DERMABOND ADVANCED .7 DNX12 (GAUZE/BANDAGES/DRESSINGS) ×2 IMPLANT
DRAPE U-SHAPE 47X51 STRL (DRAPES) ×2 IMPLANT
DRESSING AQUACEL AG SP 3.5X10 (GAUZE/BANDAGES/DRESSINGS) ×2 IMPLANT
DRSG AQUACEL AG SP 3.5X10 (GAUZE/BANDAGES/DRESSINGS) ×1
DURAPREP 26ML APPLICATOR (WOUND CARE) ×4 IMPLANT
ELECT REM PT RETURN 15FT ADLT (MISCELLANEOUS) ×2 IMPLANT
GLOVE BIO SURGEON STRL SZ 6 (GLOVE) ×2 IMPLANT
GLOVE BIOGEL PI IND STRL 6.5 (GLOVE) ×2 IMPLANT
GLOVE BIOGEL PI IND STRL 7.5 (GLOVE) ×2 IMPLANT
GLOVE ORTHO TXT STRL SZ7.5 (GLOVE) ×4 IMPLANT
GOWN STRL REUS W/ TWL LRG LVL3 (GOWN DISPOSABLE) ×4 IMPLANT
GOWN STRL REUS W/TWL LRG LVL3 (GOWN DISPOSABLE) ×2
HANDPIECE INTERPULSE COAX TIP (DISPOSABLE) ×1
HOLDER FOLEY CATH W/STRAP (MISCELLANEOUS) IMPLANT
INSERT MED ATTUNE KNEE 5 7 RT (Insert) IMPLANT
KIT TURNOVER KIT A (KITS) IMPLANT
MANIFOLD NEPTUNE II (INSTRUMENTS) ×2 IMPLANT
NDL SAFETY ECLIP 18X1.5 (MISCELLANEOUS) IMPLANT
NS IRRIG 1000ML POUR BTL (IV SOLUTION) ×2 IMPLANT
PACK TOTAL KNEE CUSTOM (KITS) ×2 IMPLANT
PIN FIX SIGMA LCS THRD HI (PIN) IMPLANT
PROTECTOR NERVE ULNAR (MISCELLANEOUS) ×2 IMPLANT
SET HNDPC FAN SPRY TIP SCT (DISPOSABLE) ×2 IMPLANT
SET PAD KNEE POSITIONER (MISCELLANEOUS) ×2 IMPLANT
SPIKE FLUID TRANSFER (MISCELLANEOUS) ×4 IMPLANT
SUT MNCRL AB 4-0 PS2 18 (SUTURE) ×2 IMPLANT
SUT STRATAFIX PDS+ 0 24IN (SUTURE) ×2 IMPLANT
SUT VIC AB 1 CT1 36 (SUTURE) ×2 IMPLANT
SUT VIC AB 2-0 CT1 27 (SUTURE) ×2
SUT VIC AB 2-0 CT1 TAPERPNT 27 (SUTURE) ×4 IMPLANT
SYR 3ML LL SCALE MARK (SYRINGE) ×2 IMPLANT
TOWEL GREEN STERILE FF (TOWEL DISPOSABLE) ×2 IMPLANT
TRAY FOLEY MTR SLVR 16FR STAT (SET/KITS/TRAYS/PACK) ×2 IMPLANT
TUBE SUCTION HIGH CAP CLEAR NV (SUCTIONS) ×2 IMPLANT
WATER STERILE IRR 1000ML POUR (IV SOLUTION) ×4 IMPLANT
WRAP KNEE MAXI GEL POST OP (GAUZE/BANDAGES/DRESSINGS) ×2 IMPLANT

## 2023-05-01 NOTE — Discharge Instructions (Signed)

## 2023-05-01 NOTE — Anesthesia Postprocedure Evaluation (Signed)
Anesthesia Post Note  Patient: Courtney Orozco  Procedure(s) Performed: TOTAL KNEE ARTHROPLASTY (Right: Knee)     Patient location during evaluation: PACU Anesthesia Type: General Level of consciousness: awake and alert Pain management: pain level controlled Vital Signs Assessment: post-procedure vital signs reviewed and stable Respiratory status: spontaneous breathing, nonlabored ventilation and respiratory function stable Cardiovascular status: blood pressure returned to baseline Postop Assessment: no apparent nausea or vomiting Anesthetic complications: no   No notable events documented.  Last Vitals:  Vitals:   05/01/23 1245 05/01/23 1300  BP: (!) 125/49 (!) 123/44  Pulse: 64 64  Resp: 10 12  Temp:    SpO2: 94% 98%    Last Pain:  Vitals:   05/01/23 1300  TempSrc:   PainSc: 4                  Shanda Howells

## 2023-05-01 NOTE — Anesthesia Procedure Notes (Signed)
Anesthesia Regional Block: Adductor canal block   Pre-Anesthetic Checklist: , timeout performed,  Correct Patient, Correct Site, Correct Laterality,  Correct Procedure, Correct Position, site marked,  Risks and benefits discussed,  Pre-op evaluation,  At surgeon's request and post-op pain management  Laterality: Right  Prep: Maximum Sterile Barrier Precautions used, chloraprep       Needles:  Injection technique: Single-shot  Needle Type: Echogenic Stimulator Needle     Needle Length: 9cm  Needle Gauge: 22     Additional Needles:   Procedures:,,,, ultrasound used (permanent image in chart),,    Narrative:  Start time: 05/01/2023 9:34 AM End time: 05/01/2023 9:37 AM Injection made incrementally with aspirations every 5 mL.  Performed by: Personally  Anesthesiologist: Kaylyn Layer, MD  Additional Notes: Risks, benefits, and alternative discussed. Patient gave consent for procedure. Patient prepped and draped in sterile fashion. Sedation administered, patient remains easily responsive to voice. Relevant anatomy identified with ultrasound guidance. Local anesthetic given in 5cc increments with no signs or symptoms of intravascular injection. No pain or paraesthesias with injection. Patient monitored throughout procedure with signs of LAST or immediate complications. Tolerated well. Ultrasound image placed in chart.  Amalia Greenhouse, MD

## 2023-05-01 NOTE — Evaluation (Signed)
Physical Therapy Evaluation Patient Details Name: Courtney Orozco MRN: [id number] DOB: 07/02/1951 Today's Date: 05/01/2023  History of Present Illness  Pt is 72 yo female admitted on 05/01/23 for R TKA.  Pt with hx of arthritis, back pain, DM, GERD, back sx, R femur fx s/p ORIF 12/23  Clinical Impression  Pt is s/p TKA resulting in the deficits listed below (see PT Problem List). At baseline, pt ambulating with RW or "furniture surfing."  Reports she could ambulate in community but painful the next day.  She has no stairs in her home, has necessary DME, and spouse took week off work to Tax adviser.  Pt very motivated and eager to mobilize - hoping to d/c earlier tomorrow.  Today, she reports pain at 8/10 with movement but no signs of severe pain and tolerated well.  Pt ambulating 50' with RW and min guard and with good quad activation. Pt expected to progress well.  Pt will benefit from acute skilled PT to increase their independence and safety with mobility to allow discharge.          Assistance Recommended at Discharge Intermittent Supervision/Assistance  If plan is discharge home, recommend the following:  Can travel by private vehicle  A little help with walking and/or transfers;A little help with bathing/dressing/bathroom;Assistance with cooking/housework;Help with stairs or ramp for entrance        Equipment Recommendations None recommended by PT  Recommendations for Other Services       Functional Status Assessment Patient has had a recent decline in their functional status and demonstrates the ability to make significant improvements in function in a reasonable and predictable amount of time.     Precautions / Restrictions Precautions Precautions: Fall Restrictions Weight Bearing Restrictions: Yes RLE Weight Bearing: Weight bearing as tolerated      Mobility  Bed Mobility Overal bed mobility: Needs Assistance Bed Mobility: Supine to Sit     Supine to sit: Min guard           Transfers Overall transfer level: Needs assistance Equipment used: Rolling walker (2 wheels) Transfers: Sit to/from Stand Sit to Stand: Min guard           General transfer comment: cues for hand placement and R LE management; performed x 2; pt using BSC and performed ADLs with min guard    Ambulation/Gait Ambulation/Gait assistance: Min guard Gait Distance (Feet): 50 Feet Assistive device: Rolling walker (2 wheels) Gait Pattern/deviations: Step-to pattern, Decreased weight shift to right Gait velocity: decreased     General Gait Details: tolerating well, stable with RW, min cues for RW proximity  Stairs            Wheelchair Mobility     Tilt Bed    Modified Rankin (Stroke Patients Only)       Balance Overall balance assessment: Needs assistance Sitting-balance support: No upper extremity supported Sitting balance-Leahy Scale: Good     Standing balance support: Bilateral upper extremity supported, No upper extremity supported Standing balance-Leahy Scale: Fair Standing balance comment: RW to ambulate; able to do toielting ADLs without UE support                             Pertinent Vitals/Pain Pain Assessment Pain Assessment: 0-10 Pain Score: 8  Pain Location: R knee with movement Pain Descriptors / Indicators: Discomfort Pain Intervention(s): Limited activity within patient's tolerance, Monitored during session, Premedicated before session (no signs of severe pain; eager  to mobilize)    Home Living Family/patient expects to be discharged to:: Private residence Living Arrangements: Spouse/significant other;Children;Other relatives Available Help at Discharge: Family;Available 24 hours/day (spouse took week off) Type of Home: House Home Access: Ramped entrance       Home Layout: One level Home Equipment: Grab bars - tub/shower;Rolling Walker (2 wheels);Wheelchair - manual;Cane - single point;Shower seat;Electric scooter       Prior Function Prior Level of Function : Independent/Modified Independent             Mobility Comments: Could ambulate in home using RW or "furniture surfing"; Could ambulate at grocery store holding cart (but was sore and swollen next day) ADLs Comments: Reports independent with ADLs and light IADLs     Hand Dominance   Dominant Hand: Right    Extremity/Trunk Assessment   Upper Extremity Assessment Upper Extremity Assessment: Overall WFL for tasks assessed    Lower Extremity Assessment Lower Extremity Assessment: RLE deficits/detail;LLE deficits/detail RLE Deficits / Details: Expected post op changes; ROM: knee ~5 to 70 degrees; MMT: ankle 5/5, knee 3/5, hip 3/5 LLE Deficits / Details: ROM WFL; MMT 5/5    Cervical / Trunk Assessment Cervical / Trunk Assessment: Normal  Communication   Communication: No difficulties  Cognition Arousal/Alertness: Awake/alert Behavior During Therapy: WFL for tasks assessed/performed Overall Cognitive Status: Within Functional Limits for tasks assessed                                          General Comments General comments (skin integrity, edema, etc.): Pt on 2 L O2.  Reports had at home since hip fx but was not using.  Sats down to 87% without O2 so left on 2 L with sats 94%.  Pt's spouse reports pt tends to drop to 88-89% at home    Exercises     Assessment/Plan    PT Assessment Patient needs continued PT services  PT Problem List Decreased strength;Decreased mobility;Decreased range of motion;Decreased activity tolerance;Decreased balance;Decreased knowledge of use of DME;Pain;Decreased knowledge of precautions       PT Treatment Interventions DME instruction;Therapeutic activities;Modalities;Gait training;Therapeutic exercise;Patient/family education;Stair training;Functional mobility training;Balance training    PT Goals (Current goals can be found in the Care Plan section)  Acute Rehab PT Goals Patient  Stated Goal: return home early tomorrow PT Goal Formulation: With patient/family Time For Goal Achievement: 05/15/23 Potential to Achieve Goals: Good    Frequency 7X/week     Co-evaluation               AM-PAC PT "6 Clicks" Mobility  Outcome Measure Help needed turning from your back to your side while in a flat bed without using bedrails?: A Little Help needed moving from lying on your back to sitting on the side of a flat bed without using bedrails?: A Little Help needed moving to and from a bed to a chair (including a wheelchair)?: A Little Help needed standing up from a chair using your arms (e.g., wheelchair or bedside chair)?: A Little Help needed to walk in hospital room?: A Little Help needed climbing 3-5 steps with a railing? : A Lot 6 Click Score: 17    End of Session Equipment Utilized During Treatment: Gait belt Activity Tolerance: Patient tolerated treatment well Patient left: in chair;with call bell/phone within reach;with chair alarm set;with family/visitor present Nurse Communication: Mobility status PT Visit Diagnosis: Other abnormalities  of gait and mobility (R26.89);Muscle weakness (generalized) (M62.81)    Time: 4098-1191 PT Time Calculation (min) (ACUTE ONLY): 28 min   Charges:   PT Evaluation $PT Eval Low Complexity: 1 Low PT Treatments $Gait Training: 8-22 mins PT General Charges $$ ACUTE PT VISIT: 1 Visit         Anise Salvo, PT Acute Rehab [facility] [phone]   Rayetta Humphrey 05/01/2023, 4:56 PM

## 2023-05-01 NOTE — Interval H&P Note (Signed)
History and Physical Interval Note:  05/01/2023 8:44 AM  Courtney Orozco  has presented today for surgery, with the diagnosis of Right knee osteoarthritis.  The various methods of treatment have been discussed with the patient and family. After consideration of risks, benefits and other options for treatment, the patient has consented to  Procedure(s): TOTAL KNEE ARTHROPLASTY (Right) as a surgical intervention.  The patient's history has been reviewed, patient examined, no change in status, stable for surgery.  I have reviewed the patient's chart and labs.  Questions were answered to the patient's satisfaction.     Shelda Pal

## 2023-05-01 NOTE — Transfer of Care (Signed)
Immediate Anesthesia Transfer of Care Note  Patient: Courtney Orozco  Procedure(s) Performed: TOTAL KNEE ARTHROPLASTY (Right: Knee)  Patient Location: PACU  Anesthesia Type:General  Level of Consciousness: sedated  Airway & Oxygen Therapy: Patient Spontanous Breathing and Patient connected to face mask oxygen  Post-op Assessment: Report given to RN and Post -op Vital signs reviewed and stable  Post vital signs: Reviewed and stable  Last Vitals:  Vitals Value Taken Time  BP 144/65 05/01/23 1154  Temp    Pulse 73 05/01/23 1155  Resp 17 05/01/23 1155  SpO2 97 % 05/01/23 1155  Vitals shown include unvalidated device data.  Last Pain:  Vitals:   05/01/23 0948  TempSrc:   PainSc: 0-No pain         Complications: No notable events documented.

## 2023-05-01 NOTE — Op Note (Addendum)
NAME:  Courtney Orozco                      MEDICAL RECORD NO.:  [id number]                             FACILITY:  [facility]      PHYSICIAN:  Madlyn Frankel. Charlann Boxer, M.D.  DATE OF BIRTH:  1951-04-04      DATE OF PROCEDURE:  05/01/2023                                     OPERATIVE REPORT         PREOPERATIVE DIAGNOSIS:  Right knee osteoarthritis.      POSTOPERATIVE DIAGNOSIS:  Right knee osteoarthritis.      FINDINGS:  The patient was noted to have complete loss of cartilage and   bone-on-bone arthritis with associated osteophytes in the lateral and patellofemoral compartments of   the knee with a significant synovitis and associated effusion.  The patient had failed months of conservative treatment including medications, injection therapy, activity modification.     PROCEDURE:  Right total knee replacement.      COMPONENTS USED:  DePuy Attune FB CR MS knee   system, a size 5 femur, 5 tibia, size 7 mm CR MS AOX insert, and 35 anatomic patellar   button.      SURGEON:  Madlyn Frankel. Charlann Boxer, M.D.      ASSISTANT:  Rosalene Billings, PA-C.      ANESTHESIA:  General and Regional.      SPECIMENS:  None.      COMPLICATION:  None.      DRAINS:  None.  EBL: <200 cc      TOURNIQUET TIME:  30 min at 225 mmHg     The patient was stable to the recovery room.      INDICATION FOR PROCEDURE:  Courtney Orozco is a 72 y.o. female patient of   mine.  The patient had been seen, evaluated, and treated for months conservatively in the   office with medication, activity modification, and injections.  The patient had   radiographic changes of bone-on-bone arthritis with endplate sclerosis and osteophytes noted.  Based on the radiographic changes and failed conservative measures, the patient   decided to proceed with definitive treatment, total knee replacement.  Risks of infection, DVT, component failure, need for revision surgery, neurovascular injury were reviewed in the office setting.  The postop course was  reviewed stressing the efforts to maximize post-operative satisfaction and function.  Consent was obtained for benefit of pain   relief.      PROCEDURE IN DETAIL:  The patient was brought to the operative theater.   Once adequate anesthesia, preoperative antibiotics, 2 gm of Ancef,1 gm of Tranexamic Acid, and 10 mg of Decadron administered, the patient was positioned supine with a right thigh tourniquet placed.  The  right lower extremity was prepped and draped in sterile fashion.  A time-   out was performed identifying the patient, planned procedure, and the appropriate extremity.      The right lower extremity was placed in the [facility] leg holder.  The leg was   exsanguinated, tourniquet elevated to 225 mmHg.  A midline incision was   made followed by median parapatellar arthrotomy.  Following initial   exposure, attention was  first directed to the patella.  Precut   measurement was noted to be 24 mm.  I resected down to 14 mm and used a   35 anatomic patellar button to restore patellar height as well as cover the cut surface.      The lug holes were drilled and a metal shim was placed to protect the   patella from retractors and saw blade during the procedure.      At this point, attention was now directed to the femur.  The femoral   canal was opened with a drill, irrigated to try to prevent fat emboli.  An   intramedullary rod was passed at 3 degrees valgus, 9 mm of bone was   resected off the distal femur.  Following this resection, the tibia was   subluxated anteriorly.  Using the extramedullary guide, 4 mm of bone was resected off   the proximal medial tibia.  We confirmed the gap would be   stable medially and laterally with a size 5 spacer block as well as confirmed that the tibial cut was perpendicular in the coronal plane, checking with an alignment rod.      Once this was done, I sized the femur to be a size 5 in the anterior-   posterior dimension, chose a standard component  based on medial and   lateral dimension.  The size 5 rotation block was then pinned in   position anterior referenced using the C-clamp to set rotation.  The   anterior, posterior, and  chamfer cuts were made without difficulty nor   notching making certain that I was along the anterior cortex to help   with flexion gap stability.      The final box cut was made off the lateral aspect of distal femur.      At this point, the tibia was sized to be a size 5.  The size 5 tray was   then pinned in position through the medial third of the tubercle,   drilled, and keel punched.  Trial reduction was now carried with a 5 femur,  5 tibia, a size 7 mm CR MS insert, and the 35 anatomic patella botton.  The knee was brought to full extension with good flexion stability with the patella   tracking through the trochlea without application of pressure.  Given   all these findings the trial components removed.  Final components were   opened and cement was mixed.  The knee was irrigated with normal saline solution and pulse lavage.  The synovial lining was   then injected with 30 cc of 0.25% Marcaine with epinephrine, 1 cc of Toradol and 30 cc of NS for a total of 61 cc.     Final implants were then cemented onto cleaned and dried cut surfaces of bone with the knee brought to extension with a size 7 mm CR MS trial insert.      Once the cement had fully cured, excess cement was removed   throughout the knee.  I confirmed that I was satisfied with the range of   motion and stability, and the final size 7 mm CR MS AOX insert was chosen.  It was   placed into the knee.      The tourniquet had been let down at 30 minutes.  No significant   hemostasis was required.  The extensor mechanism was then reapproximated using #1 Vicryl and #1 Stratafix sutures with the knee   in flexion.  The   remaining wound was closed with 2-0 Vicryl and running 4-0 Monocryl.   The knee was cleaned, dried, dressed sterilely using  Dermabond and   Aquacel dressing.  The patient was then   brought to recovery room in stable condition, tolerating the procedure   well.   Please note that Physician Assistant, Rosalene Billings, PA-C was present for the entirety of the case, and was utilized for pre-operative positioning, peri-operative retractor management, general facilitation of the procedure and for primary wound closure at the end of the case.              Madlyn Frankel Charlann Boxer, M.D.    05/01/2023 10:23 AM

## 2023-05-01 NOTE — Anesthesia Procedure Notes (Signed)
Procedure Name: LMA Insertion Date/Time: 05/01/2023 10:19 AM  Performed by: Doran Clay, CRNAPre-anesthesia Checklist: Patient identified, Emergency Drugs available, Suction available, Patient being monitored and Timeout performed Patient Re-evaluated:Patient Re-evaluated prior to induction Oxygen Delivery Method: Circle system utilized Preoxygenation: Pre-oxygenation with 100% oxygen Induction Type: IV induction LMA: LMA inserted LMA Size: 4.0 Tube type: Oral Number of attempts: 1 Placement Confirmation: positive ETCO2 and breath sounds checked- equal and bilateral Tube secured with: Tape Dental Injury: Teeth and Oropharynx as per pre-operative assessment

## 2023-05-02 ENCOUNTER — Encounter (HOSPITAL_COMMUNITY): Payer: Self-pay | Admitting: Orthopedic Surgery

## 2023-05-02 DIAGNOSIS — M1711 Unilateral primary osteoarthritis, right knee: Secondary | ICD-10-CM | POA: Diagnosis not present

## 2023-05-02 LAB — CBC
HCT: 32.9 % — ABNORMAL LOW (ref 36.0–46.0)
Hemoglobin: 10.4 g/dL — ABNORMAL LOW (ref 12.0–15.0)
MCH: 30.9 pg (ref 26.0–34.0)
MCHC: 31.6 g/dL (ref 30.0–36.0)
MCV: 97.6 fL (ref 80.0–100.0)
Platelets: 184 10*3/uL (ref 150–400)
RBC: 3.37 MIL/uL — ABNORMAL LOW (ref 3.87–5.11)
RDW: 13 % (ref 11.5–15.5)
WBC: 10 10*3/uL (ref 4.0–10.5)
nRBC: 0 % (ref 0.0–0.2)

## 2023-05-02 LAB — BASIC METABOLIC PANEL
Anion gap: 8 (ref 5–15)
BUN: 10 mg/dL (ref 8–23)
CO2: 25 mmol/L (ref 22–32)
Calcium: 7.7 mg/dL — ABNORMAL LOW (ref 8.9–10.3)
Chloride: 102 mmol/L (ref 98–111)
Creatinine, Ser: 0.66 mg/dL (ref 0.44–1.00)
GFR, Estimated: >60 mL/min (ref >60)
Glucose, Bld: 110 mg/dL — ABNORMAL HIGH (ref 70–99)
Potassium: 3.7 mmol/L (ref 3.5–5.1)
Sodium: 135 mmol/L (ref 135–145)

## 2023-05-02 MED ORDER — POLYETHYLENE GLYCOL 3350 17 G PO PACK: 17.0000 g | PACK | Freq: Two times a day (BID) | ORAL | 0 refills | Status: AC

## 2023-05-02 MED ORDER — ASPIRIN 81 MG PO CHEW: 81.0000 mg | CHEWABLE_TABLET | Freq: Two times a day (BID) | ORAL | 0 refills | Status: AC

## 2023-05-02 MED ORDER — SENNA 8.6 MG PO TABS: 2.0000 | ORAL_TABLET | Freq: Every day | ORAL | 0 refills | Status: AC

## 2023-05-02 MED ORDER — OXYCODONE HCL 5 MG PO TABS: 5.0000 mg | ORAL_TABLET | ORAL | 0 refills | Status: AC | PRN

## 2023-05-02 NOTE — Progress Notes (Signed)
Subjective: 1 Day Post-Op Procedure(s) (LRB): TOTAL KNEE ARTHROPLASTY (Right) Patient reports pain as mild.   Patient seen in rounds by Dr. Charlann Boxer. Patient is well, and has had no acute complaints or problems. No acute events overnight. Foley catheter removed. Patient ambulated 50 feet with PT.  We will start therapy today.   Objective: Vital signs in last 24 hours: Temp:  [97.6 F (36.4 C)-98 F (36.7 C)] 97.6 F (36.4 C) (07/10 0505) Pulse Rate:  [51-82] 56 (07/10 0505) Resp:  [10-18] 17 (07/10 0505) BP: (99-161)/(43-71) 115/48 (07/10 0505) SpO2:  [83 %-98 %] 96 % (07/10 0505) Weight:  [73 kg] 73 kg (07/09 0751)  Intake/Output from previous day:  Intake/Output Summary (Last 24 hours) at 05/02/2023 0730 Last data filed at 05/02/2023 0600 Gross per 24 hour  Intake 2949.86 ml  Output --  Net 2949.86 ml     Intake/Output this shift: No intake/output data recorded.  Labs: Recent Labs    05/02/23 0338  HGB 10.4*   Recent Labs    05/02/23 0338  WBC 10.0  RBC 3.37*  HCT 32.9*  PLT 184   Recent Labs    05/02/23 0338  NA 135  K 3.7  CL 102  CO2 25  BUN 10  CREATININE 0.66  GLUCOSE 110*  CALCIUM 7.7*   No results for input(s): "LABPT", "INR" in the last 72 hours.  Exam: General - Patient is Alert and Oriented Extremity - Neurologically intact Sensation intact distally Intact pulses distally Dorsiflexion/Plantar flexion intact Dressing - dressing C/D/I Motor Function - intact, moving foot and toes well on exam.   Past Medical History:  Diagnosis Date   Anxiety    Arthritis    Back pain    Depression    Diabetes mellitus without complication (HCC)    no meds in 5 years    Gait instability    GERD (gastroesophageal reflux disease)    Headache    hx of migraines    Hepatitis    hx of hep B - 44 years ago    History of kidney stones    Hypertension    Hypothyroidism    Influenza A 10/20/2022   Memory loss    Pneumonia    hx of 01/2015     PONV (postoperative nausea and vomiting)    pt states "the last two times ive been put to sleep, my oxygen drops low and they have trouble waking me up.   Thyroid disease    TIA (transient ischemic attack)     Assessment/Plan: 1 Day Post-Op Procedure(s) (LRB): TOTAL KNEE ARTHROPLASTY (Right) Principal Problem:   S/P total knee replacement, right Active Problems:   S/P total knee arthroplasty, right  Estimated body mass index is 28.51 kg/m as calculated from the following:   Height as of this encounter: 5\' 3"  (1.6 m).   Weight as of this encounter: 73 kg. Advance diet Up with therapy D/C IV fluids   Patient's anticipated LOS is less than 2 midnights, meeting these requirements: - Younger than 74 - Lives within 1 hour of care - Has a competent adult at home to recover with post-op recover - NO history of  - Chronic pain requiring opiods  - Diabetes  - Coronary Artery Disease  - Heart failure  - Heart attack  - Stroke  - DVT/VTE  - Cardiac arrhythmia  - Respiratory Failure/COPD  - Renal failure  - Anemia  - Advanced Liver disease     DVT Prophylaxis -  Aspirin Weight bearing as tolerated.  Hgb stable at 10.4 this AM  Will send with abx due to recent smoking hx   Plan is to go Home after hospital stay. Plan for discharge today following 1-2 sessions of PT as long as they are meeting their goals. Patient is scheduled for OPPT. Follow up in the office in 2 weeks.   Rosalene Billings, PA-C Orthopedic Surgery [phone] 05/02/2023, 7:30 AM

## 2023-05-02 NOTE — TOC Transition Note (Signed)
Transition of Care [facility]) - CM/SW Discharge Note   Patient Details  Name: Courtney Orozco MRN: [id number] Date of Birth: Apr 09, 1951  [facility] of [facility]) CM/SW Contact:  Amada Jupiter, LCSW Phone Number: 05/02/2023, 9:49 AM   Clinical Narrative:     Met with pt who confirms need for RW at home - no agency preference - order placed with [employer] for delivery to room.  OPPT already arranged with [facility]).  No further TOC needs.  Final next level of care: OP Rehab Barriers to Discharge: No Barriers Identified   Patient Goals and CMS Choice      Discharge Placement                         Discharge Plan and Services Additional resources added to the After Visit Summary for                  DME Arranged: Walker rolling DME Agency: [employer] Date DME Agency Contacted: 05/02/23 Time DME Agency Contacted: 0930 Representative spoke with at DME Agency: Yvonna Alanis            Social Determinants of Health (SDOH) Interventions SDOH Screenings   Food Insecurity: No Food Insecurity (05/01/2023)  Housing: Low Risk  (05/01/2023)  Transportation Needs: No Transportation Needs (05/01/2023)  Utilities: Not At Risk (05/01/2023)  Tobacco Use: High Risk (05/02/2023)     Readmission Risk Interventions    10/10/2022    1:58 PM  Readmission Risk Prevention Plan  Post Dischage Appt Not Complete  Transportation Screening Complete

## 2023-05-02 NOTE — Plan of Care (Signed)
Pt ready to DC home with husband 

## 2023-05-02 NOTE — Progress Notes (Signed)
Physical Therapy Treatment Patient Details Name: Courtney Orozco MRN: [id number] DOB: Mar 03, 1951 Today's Date: 05/02/2023   History of Present Illness Pt is 72 yo female admitted on 05/01/23 for R TKA.  Pt with hx of arthritis, back pain, DM, GERD, back sx, R femur fx s/p ORIF 12/23    PT Comments  Pt is POD # 1 and is progressing well.  She has excellent ROM, quad activation, and pain control. Pt was able to ambulate 100' with supervision safely. Pt demonstrates safe gait & transfers in order to return home from PT perspective once discharged by MD.  While in hospital, will continue to benefit from PT for skilled therapy to advance mobility and exercises.        Assistance Recommended at Discharge Intermittent Supervision/Assistance  If plan is discharge home, recommend the following:  Can travel by private vehicle    A little help with walking and/or transfers;A little help with bathing/dressing/bathroom;Assistance with cooking/housework;Help with stairs or ramp for entrance      Equipment Recommendations  None recommended by PT    Recommendations for Other Services       Precautions / Restrictions Precautions Precautions: Fall Restrictions RLE Weight Bearing: Weight bearing as tolerated     Mobility  Bed Mobility               General bed mobility comments: in chair    Transfers Overall transfer level: Needs assistance Equipment used: Rolling walker (2 wheels) Transfers: Sit to/from Stand Sit to Stand: Supervision           General transfer comment: good hand placement, R LE management, and safe descent    Ambulation/Gait Ambulation/Gait assistance: Supervision, Min guard Gait Distance (Feet): 100 Feet Assistive device: Rolling walker (2 wheels) Gait Pattern/deviations: Step-to pattern, Decreased weight shift to right Gait velocity: decreased     General Gait Details: min guard progressing to supervision; good RW proximity; tolerated well and stable;  does drift to right when talking - cued to focus on gait path   Stairs Stairs:  (has ramp)           Wheelchair Mobility     Tilt Bed    Modified Rankin (Stroke Patients Only)       Balance Overall balance assessment: Needs assistance Sitting-balance support: No upper extremity supported Sitting balance-Leahy Scale: Good     Standing balance support: Bilateral upper extremity supported, No upper extremity supported Standing balance-Leahy Scale: Fair Standing balance comment: RW to ambulate; able to do toielting ADLs without UE support                            Cognition Arousal/Alertness: Awake/alert Behavior During Therapy: WFL for tasks assessed/performed Overall Cognitive Status: Within Functional Limits for tasks assessed                                          Exercises Total Joint Exercises Ankle Circles/Pumps: AROM, Both, 10 reps, Supine Quad Sets: AROM, Supine, Both, 10 reps Heel Slides: AAROM, Right, 10 reps, Supine Hip ABduction/ADduction: AAROM, Right, 10 reps, Supine Long Arc Quad: AROM, Right, 10 reps, Seated Knee Flexion: AROM, Right, 10 reps, Seated Goniometric ROM: R knee ~3 to 90 degrees Other Exercises Other Exercises: Educated on AAROM techniques with gait belt as needed and for exercises to tolerance; tolerated all well  General Comments General comments (skin integrity, edema, etc.): On RA today with VSS   Educated on safe ice use, no pivots, car transfers, resting with leg straight, and TED hose during day. Also, encouraged walking every 1-2 hours during day. Educated on HEP with focus on mobility the first weeks. Discussed doing exercises within pain control and if pain increasing could decreased ROM, reps, and stop exercises as needed. Encouraged to perform quad sets and ankle pumps frequently for blood flow and to promote full knee extension.    Pertinent Vitals/Pain Pain Assessment Pain Assessment:  0-10 Pain Score: 3  Pain Location: R knee Pain Descriptors / Indicators: Discomfort, Aching Pain Intervention(s): Limited activity within patient's tolerance, Monitored during session, Premedicated before session, Ice applied, Repositioned    Home Living                          Prior Function            PT Goals (current goals can now be found in the care plan section) Progress towards PT goals: Progressing toward goals    Frequency    7X/week      PT Plan Current plan remains appropriate    Co-evaluation              AM-PAC PT "6 Clicks" Mobility   Outcome Measure  Help needed turning from your back to your side while in a flat bed without using bedrails?: A Little Help needed moving from lying on your back to sitting on the side of a flat bed without using bedrails?: A Little Help needed moving to and from a bed to a chair (including a wheelchair)?: A Little Help needed standing up from a chair using your arms (e.g., wheelchair or bedside chair)?: A Little Help needed to walk in hospital room?: A Little Help needed climbing 3-5 steps with a railing? : A Little 6 Click Score: 18    End of Session Equipment Utilized During Treatment: Gait belt Activity Tolerance: Patient tolerated treatment well Patient left: in chair;with call bell/phone within reach;with chair alarm set;with family/visitor present Nurse Communication: Mobility status PT Visit Diagnosis: Other abnormalities of gait and mobility (R26.89);Muscle weakness (generalized) (M62.81)     Time: 1040-1105 PT Time Calculation (min) (ACUTE ONLY): 25 min  Charges:    $Gait Training: 8-22 mins $Therapeutic Exercise: 8-22 mins PT General Charges $$ ACUTE PT VISIT: 1 Visit                     Anise Salvo, PT Acute Rehab Services [facility] [phone]    Rayetta Humphrey 05/02/2023, 11:12 AM

## 2023-05-04 ENCOUNTER — Ambulatory Visit: Payer: PPO | Attending: Student

## 2023-05-04 ENCOUNTER — Other Ambulatory Visit: Payer: Self-pay

## 2023-05-04 DIAGNOSIS — M25561 Pain in right knee: Secondary | ICD-10-CM | POA: Diagnosis present

## 2023-05-04 DIAGNOSIS — M25661 Stiffness of right knee, not elsewhere classified: Secondary | ICD-10-CM | POA: Diagnosis present

## 2023-05-04 NOTE — Therapy (Signed)
OUTPATIENT PHYSICAL THERAPY LOWER EXTREMITY EVALUATION   Patient Name: Courtney Orozco MRN: [id number] DOB:1951/02/13, 72 y.o., female Today's Date: 05/04/2023  END OF SESSION:  PT End of Session - 05/04/23 1016     Visit Number 1    Number of Visits 12    Date for PT Re-Evaluation 06/01/23    PT Start Time 1018    PT Stop Time 1059    PT Time Calculation (min) 41 min    Activity Tolerance Patient tolerated treatment well    Behavior During Therapy WFL for tasks assessed/performed             Past Medical History:  Diagnosis Date   Anxiety    Arthritis    Back pain    Depression    Diabetes mellitus without complication (HCC)    no meds in 5 years    Gait instability    GERD (gastroesophageal reflux disease)    Headache    hx of migraines    Hepatitis    hx of hep B - 44 years ago    History of kidney stones    Hypertension    Hypothyroidism    Influenza A 10/20/2022   Memory loss    Pneumonia    hx of 01/2015    PONV (postoperative nausea and vomiting)    pt states "the last two times ive been put to sleep, my oxygen drops low and they have trouble waking me up.   Thyroid disease    TIA (transient ischemic attack)    Past Surgical History:  Procedure Laterality Date   ABDOMINAL HYSTERECTOMY     APPENDECTOMY     BACK SURGERY     x 4   bladder tack surgery      BREAST SURGERY Right    removal of 3 benign tumors   CATARACT EXTRACTION W/PHACO Left 04/30/2017   Procedure: CATARACT EXTRACTION PHACO AND INTRAOCULAR LENS PLACEMENT (IOC);  Surgeon: Gemma Payor, MD;  Location: [facility];  Service: Ophthalmology;  Laterality: Left;  CDE: 7.16   CATARACT EXTRACTION W/PHACO Right 05/14/2017   Procedure: CATARACT EXTRACTION PHACO AND INTRAOCULAR LENS PLACEMENT RIGHT EYE;  Surgeon: Gemma Payor, MD;  Location: [facility];  Service: Ophthalmology;  Laterality: Right;  CDE: 6.63   CHOLECYSTECTOMY N/A 08/21/2018   Procedure: LAPAROSCOPIC CHOLECYSTECTOMY;  Surgeon: Lucretia Roers, MD;  Location: [facility];  Service: General;  Laterality: N/A;   CYSTOSCOPY WITH STENT PLACEMENT Right 09/28/2017   Procedure: CYSTOSCOPY WITH STENT PLACEMENT;  Surgeon: Marcine Matar, MD;  Location: [facility];  Service: Urology;  Laterality: Right;   CYSTOSCOPY WITH URETEROSCOPY, STONE BASKETRY AND STENT PLACEMENT Right 04/06/2015   Procedure: CYSTOSCOPY WITH RIGHT URETEROSCOPY, STONE BASKETRY AND STENT PLACEMENT;  Surgeon: Bjorn Pippin, MD;  Location: [facility];  Service: Urology;  Laterality: Right;   CYSTOSCOPY/RETROGRADE/URETEROSCOPY Right 11/28/2017   Procedure: CYSTOSCOPY,  REMOVAL OF RIGHT URETERAL STENT, RIGHT RETROGRADE PYELOGRAM, RIGHT URETEROSCOPY, STONE BASKET EXTRACTION RIGHT URETERAL CALCULUS;  Surgeon: Malen Gauze, MD;  Location: [facility];  Service: Urology;  Laterality: Right;   HOLMIUM LASER APPLICATION Right 04/06/2015   Procedure: HOLMIUM LASER APPLICATION;  Surgeon: Bjorn Pippin, MD;  Location: [facility];  Service: Urology;  Laterality: Right;   INTRAMEDULLARY (IM) NAIL INTERTROCHANTERIC Right 10/11/2022   Procedure: INTRAMEDULLARY (IM) NAIL INTERTROCHANTERIC;  Surgeon: Oliver Barre, MD;  Location: [facility];  Service: Orthopedics;  Laterality: Right;   KNEE ARTHROSCOPY WITH LATERAL MENISECTOMY Right 05/09/2022   Procedure: KNEE ARTHROSCOPY  WITH LATERAL MENISECTOMY;  Surgeon: Vickki Hearing, MD;  Location: [facility];  Service: Orthopedics;  Laterality: Right;   KNEE SURGERY     left   ROTATOR CUFF REPAIR Left    thumb surgery     left   TOTAL KNEE ARTHROPLASTY Right 05/01/2023   Procedure: TOTAL KNEE ARTHROPLASTY;  Surgeon: Durene Romans, MD;  Location: [facility];  Service: Orthopedics;  Laterality: Right;   VAGINA SURGERY     Patient Active Problem List   Diagnosis Date Noted   S/P total knee arthroplasty, right 05/01/2023   S/P total knee replacement, right 05/01/2023   Displaced intertrochanteric fracture of right femur, init (HCC) 10/10/2022   Closed displaced  intertrochanteric fracture of right femur (HCC) 10/10/2022   Complex tear of lateral meniscus of right knee as current injury    Insomnia 04/26/2022   Chronic cystitis with hematuria 09/08/2020   Calculus of gallbladder without cholecystitis without obstruction 08/13/2018   Infection due to ESBL-producing Escherichia coli 07/08/2018   Sepsis secondary to UTI (HCC) 05/22/2018   GERD (gastroesophageal reflux disease) 05/22/2018   Hypothyroidism 05/22/2018   Type 2 diabetes mellitus (HCC) 05/22/2018   Anxiety and depression 05/22/2018   HCAP (healthcare-associated pneumonia) 05/22/2018   Chronic back pain 05/22/2018   Bacteremia 10/01/2017   Acute metabolic encephalopathy 09/30/2017   Shortness of breath 09/30/2017   ESBL E Coli- UTI (urinary tract infection) 09/28/2017   Leukocytosis 09/28/2017   Right ureteral stone    Frequent falls 08/15/2017   Hypotension due to drugs 02/16/2017   Syncope 01/22/2017   CKD (chronic kidney disease) stage 3, GFR 30-59 ml/min (HCC) 10/22/2015   Nephrolithiasis 04/07/2015   Renal stone 04/06/2015   Intertrigo 03/26/2015   Fasting hyperglycemia 01/25/2015   Controlled substance agreement signed 09/09/2013   Hyperlipidemia 12/27/2010   Hypertension 02/04/2010   History of colonic polyps 01/08/2001   Family history of malignant neoplasm of gastrointestinal tract 09/05/2000   Chronic hepatitis B with hepatic coma (HCC) 12/15/1999   Edema 08/09/1999    PCP: Theodoro Kos, MD  REFERRING PROVIDER: Cassandria Anger, PA-C   REFERRING DIAG: Unilateral primary osteoarthritis, right knee   THERAPY DIAG:  Acute pain of right knee  Stiffness of right knee, not elsewhere classified  Rationale for Evaluation and Treatment: Rehabilitation  ONSET DATE: 05/01/23  SUBJECTIVE:   SUBJECTIVE STATEMENT: Patient reports that she had a right knee replacement on 05/01/23. She notes that her knee hurts pretty bad. She has been trying to take Tylenol instead of  the hydrocodone. She has been using a walker for a long time, but she will hold onto the walls at times at home. She notes that she is sleeping in a recliner since surgery.   PERTINENT HISTORY: Hypertension, type 2 diabetes, hepatitis B, chronic kidney disease, anxiety, depression, history of falling, current smoker, arthritis, chronic low back pain with multiple surgeries, and history of a TIA PAIN:  Are you having pain? Yes: NPRS scale: 10/10 Pain location: anterior right knee Pain description: "feels like someone has their hand in their trying to pull it out" Aggravating factors: standing, walking, transfers Relieving factors: medication and elevated her leg  PRECAUTIONS: Fall  RED FLAGS: None   WEIGHT BEARING RESTRICTIONS: No  FALLS:  Has patient fallen in last 6 months?  No, last fall was December 2023  LIVING ENVIRONMENT: Lives with: lives with their family Lives in: House/apartment Stairs: No Has following equipment at home: Dan Humphreys - 2 wheeled, Environmental consultant - 4  wheeled, and Ramped entry  OCCUPATION: retired  PLOF: Independent  PATIENT GOALS: be able to go to church, cook and clean around her house, and improved mobility  NEXT MD VISIT: week of 05/14/23  OBJECTIVE:  PATIENT SURVEYS:  FOTO to be completed at first follow up  COGNITION: Overall cognitive status: Within functional limits for tasks assessed     SENSATION: Light touch: diminished sensation in both lower extremities below the knee Patient reports numbness in both feet.   EDEMA:  Patient has compression stockings donned limiting lower extremity edema  POSTURE: rounded shoulders and forward head  PALPATION: TTP: right quadriceps and hip adductors  LOWER EXTREMITY ROM: patient reports that she is unable to lay supine due to previous back surgeries; measured in sitting  Active ROM Right eval Left eval  Hip flexion    Hip extension    Hip abduction    Hip adduction    Hip internal rotation    Hip  external rotation    Knee flexion 71/ 77 (PROM) 129  Knee extension 24/ 14 (PROM) 5  Ankle dorsiflexion    Ankle plantarflexion    Ankle inversion    Ankle eversion     (Blank rows = not tested)  LOWER EXTREMITY MMT:  MMT Right eval Left eval  Hip flexion    Hip extension    Hip abduction    Hip adduction    Hip internal rotation    Hip external rotation    Knee flexion    Knee extension    Ankle dorsiflexion    Ankle plantarflexion    Ankle inversion    Ankle eversion     (Blank rows = not tested)  LOWER EXTREMITY SPECIAL TESTS:  Not tested due to surgical condition  FUNCTIONAL TESTS:  Stand to sit transfer: required maximal UE support from armrests and NWB on RLE Stand to sit transfer: required maximal UE support from armrests and NWB on RLE   GAIT: Assistive device utilized: Environmental consultant - 2 wheeled Level of assistance: Modified independence Comments: step to pattern with poor foot clearance bilaterally and right knee flexed in stance phase   TODAY'S TREATMENT:                                                                                                                              DATE:     PATIENT EDUCATION:  Education details: reviewed HEP, POC, healing, prognosis, and goals for therapy Person educated: Patient Education method: Explanation Education comprehension: verbalized understanding  HOME EXERCISE PROGRAM: Reviewed HEP   ASSESSMENT:  CLINICAL IMPRESSION: Patient is a 72 y.o. female who was seen today for physical therapy evaluation and treatment following a right total knee arthroplasty on 05/01/23. She presented with moderate to high pain severity and irritability with transfers being the most aggravating to her familiar symptoms. She exhibited no signs or symptoms of any postoperative complications. Her HEP was reviewed and she was encouraged to continue  with these interventions. Recommend that she continue with skilled physical therapy to address her  impairments to return to her prior level of function.  OBJECTIVE IMPAIRMENTS: Abnormal gait, decreased activity tolerance, decreased balance, decreased mobility, difficulty walking, decreased ROM, decreased strength, hypomobility, increased edema, impaired sensation, impaired tone, and pain.   ACTIVITY LIMITATIONS: carrying, lifting, sitting, standing, squatting, sleeping, transfers, and locomotion level  PARTICIPATION LIMITATIONS: meal prep, cleaning, laundry, shopping, community activity, and church  PERSONAL FACTORS: 3+ comorbidities: Hypertension, type 2 diabetes, hepatitis B, chronic kidney disease, anxiety, depression, history of falling, current smoker, arthritis, chronic low back pain with multiple surgeries, and history of a TIA  are also affecting patient's functional outcome.   REHAB POTENTIAL: Good  CLINICAL DECISION MAKING: Evolving/moderate complexity  EVALUATION COMPLEXITY: Moderate   GOALS: Goals reviewed with patient? Yes  LONG TERM GOALS: Target date: 06/01/23  Patient will be independent with her HEP. Baseline:  Goal status: INITIAL  2.  Patient will be able to transfer from sitting to standing with minimal to no upper extremity support for improved independence. Baseline:  Goal status: INITIAL  3.  Patient will be able to transfer from standing to sitting with no significant deviations for improved mobility. Baseline:  Goal status: INITIAL  4.  Patient will be able to demonstrate at least 120 degrees of right knee flexion for improved function navigating stairs. Baseline:  Goal status: INITIAL  5.  Patient will be able to demonstrate right knee extension within 5 degrees of neutral for improved gait mechanics. Baseline:  Goal status: INITIAL  6.  Patient will report being able to attend church without being limited by her right knee for improved community mobility.  Baseline:  Goal status: INITIAL   PLAN:  PT FREQUENCY: 2-3x/week  PT DURATION: 4  weeks  PLANNED INTERVENTIONS: Therapeutic exercises, Therapeutic activity, Neuromuscular re-education, Balance training, Gait training, Patient/Family education, Self Care, Joint mobilization, Stair training, Electrical stimulation, Cryotherapy, Moist heat, Vasopneumatic device, Manual therapy, and Re-evaluation  PLAN FOR NEXT SESSION: NuStep, quad set, heel slides, gastrocnemius and hamstring stretching, PROM, and modalities as needed (patient requests to avoid the bike due to low back pain)    Granville Lewis, PT 05/04/2023, 1:17 PM

## 2023-05-08 ENCOUNTER — Encounter: Payer: Self-pay | Admitting: Physical Therapy

## 2023-05-08 ENCOUNTER — Ambulatory Visit: Payer: PPO | Admitting: Physical Therapy

## 2023-05-08 DIAGNOSIS — M25661 Stiffness of right knee, not elsewhere classified: Secondary | ICD-10-CM

## 2023-05-08 DIAGNOSIS — M25561 Pain in right knee: Secondary | ICD-10-CM

## 2023-05-08 NOTE — Discharge Summary (Signed)
Patient ID: Courtney Orozco MRN: [id number] DOB/AGE: 1950/12/03 72 y.o.  Admit date: 05/01/2023 Discharge date: 05/02/2023  Admission Diagnoses:  Right knee osteoarthritis  Discharge Diagnoses:  Principal Problem:   S/P total knee replacement, right Active Problems:   S/P total knee arthroplasty, right   Past Medical History:  Diagnosis Date   Anxiety    Arthritis    Back pain    Depression    Diabetes mellitus without complication (HCC)    no meds in 5 years    Gait instability    GERD (gastroesophageal reflux disease)    Headache    hx of migraines    Hepatitis    hx of hep B - 44 years ago    History of kidney stones    Hypertension    Hypothyroidism    Influenza A 10/20/2022   Memory loss    Pneumonia    hx of 01/2015    PONV (postoperative nausea and vomiting)    pt states "the last two times ive been put to sleep, my oxygen drops low and they have trouble waking me up.   Thyroid disease    TIA (transient ischemic attack)     Surgeries: Procedure(s): TOTAL KNEE ARTHROPLASTY on 05/01/2023   Consultants:   Discharged Condition: Improved  Hospital Course: Courtney Orozco is an 72 y.o. female who was admitted 05/01/2023 for operative treatment ofS/P total knee replacement, right. Patient has severe unremitting pain that affects sleep, daily activities, and work/hobbies. After pre-op clearance the patient was taken to the operating room on 05/01/2023 and underwent  Procedure(s): TOTAL KNEE ARTHROPLASTY.    Patient was given perioperative antibiotics:  Anti-infectives (From admission, onward)    Start     Dose/Rate Route Frequency Ordered Stop   05/01/23 2200  nitrofurantoin (MACRODANTIN) capsule 50 mg  Status:  Discontinued        50 mg Oral Daily at bedtime 05/01/23 1406 05/02/23 1659   05/01/23 1630  ceFAZolin (ANCEF) IVPB 2g/100 mL premix        2 g 200 mL/hr over 30 Minutes Intravenous Every 6 hours 05/01/23 1406 05/01/23 2329   05/01/23 0730  ceFAZolin (ANCEF)  IVPB 2g/100 mL premix        2 g 200 mL/hr over 30 Minutes Intravenous On call to O.R. 05/01/23 0729 05/01/23 1020        Patient was given sequential compression devices, early ambulation, and chemoprophylaxis to prevent DVT. Patient worked with PT and was meeting their goals regarding safe ambulation and transfers.  Patient benefited maximally from hospital stay and there were no complications.    Recent vital signs: No data found.   Recent laboratory studies: No results for input(s): "WBC", "HGB", "HCT", "PLT", "NA", "K", "CL", "CO2", "BUN", "CREATININE", "GLUCOSE", "INR", "CALCIUM" in the last 72 hours.  Invalid input(s): "PT", "2"   Discharge Medications:   Allergies as of 05/02/2023       Reactions   Ciprofloxacin Hives, Other (See Comments)   Can take levaquin with no problems per patient and family   Sulfa Antibiotics Itching        Medication List     STOP taking these medications    aspirin EC 81 MG tablet Replaced by: aspirin 81 MG chewable tablet   HYDROcodone-acetaminophen 5-325 MG tablet Commonly known as: NORCO/VICODIN   ibuprofen 200 MG tablet Commonly known as: ADVIL       TAKE these medications    acetaminophen 500 MG tablet Commonly known  as: TYLENOL Take 2 tablets (1,000 mg total) by mouth every 8 (eight) hours. What changed:  when to take this reasons to take this   albuterol (2.5 MG/3ML) 0.083% nebulizer solution Commonly known as: PROVENTIL Take 2.5 mg by nebulization every 6 (six) hours as needed for wheezing or shortness of breath.   albuterol 108 (90 Base) MCG/ACT inhaler Commonly known as: VENTOLIN HFA Inhale 2 puffs into the lungs every 4 (four) hours as needed for shortness of breath or wheezing.   aspirin 81 MG chewable tablet Chew 1 tablet (81 mg total) by mouth 2 (two) times daily for 28 days. Replaces: aspirin EC 81 MG tablet   chlorpheniramine 4 MG tablet Commonly known as: CHLOR-TRIMETON Take 4 mg by mouth daily as  needed for allergies.   citalopram 20 MG tablet Commonly known as: CELEXA Take 20 mg by mouth daily.   cyclobenzaprine 5 MG tablet Commonly known as: FLEXERIL TAKE 1 TABLET BY MOUTH 3 TIMES DAILY AS NEEDED FOR MUSCLE SPAMS What changed: See the new instructions.   denosumab 60 MG/ML Sosy injection Commonly known as: PROLIA Inject 60 mg into the skin every 6 (six) months.   diazepam 5 MG tablet Commonly known as: VALIUM Take 5 mg by mouth every 8 (eight) hours as needed for anxiety.   furosemide 40 MG tablet Commonly known as: LASIX Take 40 mg by mouth at bedtime.   gabapentin 100 MG capsule Commonly known as: NEURONTIN Take 200 mg by mouth at bedtime.   levothyroxine 150 MCG tablet Commonly known as: SYNTHROID Take 150 mcg by mouth daily before breakfast.   Melatonin 5 MG Tbdp Take 20 mg by mouth at bedtime.   metoprolol tartrate 25 MG tablet Commonly known as: LOPRESSOR Take 25 mg by mouth 2 (two) times daily.   multivitamin with minerals Tabs tablet Take 1 tablet by mouth daily.   nitrofurantoin 50 MG capsule Commonly known as: MACRODANTIN TAKE ONE CAPSULE BY MOUTH AT BEDTIME   oxyCODONE 5 MG immediate release tablet Commonly known as: Oxy IR/ROXICODONE Take 1-2 tablets (5-10 mg total) by mouth every 4 (four) hours as needed for severe pain. DO NOT take hydrocodone while on this medication   pantoprazole 40 MG tablet Commonly known as: PROTONIX Take 1 tablet (40 mg total) by mouth daily.   polyethylene glycol 17 g packet Commonly known as: MIRALAX / GLYCOLAX Take 17 g by mouth 2 (two) times daily.   senna 8.6 MG Tabs tablet Commonly known as: SENOKOT Take 2 tablets (17.2 mg total) by mouth at bedtime for 14 days.   SUPER B COMPLEX PO Take 1 capsule by mouth daily.               Discharge Care Instructions  (From admission, onward)           Start     Ordered   05/02/23 0000  Change dressing       Comments: Maintain surgical dressing  until follow up in the clinic. If the edges start to pull up, may reinforce with tape. If the dressing is no longer working, may remove and cover with gauze and tape, but must keep the area dry and clean.  Call with any questions or concerns.   05/02/23 0733            Diagnostic Studies: No results found.  Disposition: Discharge disposition: 01-Home or Self Care       Discharge Instructions     Call MD / Call 911  Complete by: As directed    If you experience chest pain or shortness of breath, CALL 911 and be transported to the hospital emergency room.  If you develope a fever above 101 F, pus (white drainage) or increased drainage or redness at the wound, or calf pain, call your surgeon's office.   Change dressing   Complete by: As directed    Maintain surgical dressing until follow up in the clinic. If the edges start to pull up, may reinforce with tape. If the dressing is no longer working, may remove and cover with gauze and tape, but must keep the area dry and clean.  Call with any questions or concerns.   Constipation Prevention   Complete by: As directed    Drink plenty of fluids.  Prune juice may be helpful.  You may use a stool softener, such as Colace (over the counter) 100 mg twice a day.  Use MiraLax (over the counter) for constipation as needed.   Diet - low sodium heart healthy   Complete by: As directed    Increase activity slowly as tolerated   Complete by: As directed    Weight bearing as tolerated with assist device (walker, cane, etc) as directed, use it as long as suggested by your surgeon or therapist, typically at least 4-6 weeks.   Post-operative opioid taper instructions:   Complete by: As directed    POST-OPERATIVE OPIOID TAPER INSTRUCTIONS: It is important to wean off of your opioid medication as soon as possible. If you do not need pain medication after your surgery it is ok to stop day one. Opioids include: Codeine, Hydrocodone(Norco, Vicodin),  Oxycodone(Percocet, oxycontin) and hydromorphone amongst others.  Long term and even short term use of opiods can cause: Increased pain response Dependence Constipation Depression Respiratory depression And more.  Withdrawal symptoms can include Flu like symptoms Nausea, vomiting And more Techniques to manage these symptoms Hydrate well Eat regular healthy meals Stay active Use relaxation techniques(deep breathing, meditating, yoga) Do Not substitute Alcohol to help with tapering If you have been on opioids for less than two weeks and do not have pain than it is ok to stop all together.  Plan to wean off of opioids This plan should start within one week post op of your joint replacement. Maintain the same interval or time between taking each dose and first decrease the dose.  Cut the total daily intake of opioids by one tablet each day Next start to increase the time between doses. The last dose that should be eliminated is the evening dose.      TED hose   Complete by: As directed    Use stockings (TED hose) for 2 weeks on both leg(s).  You may remove them at night for sleeping.        Follow-up Information     Durene Romans, MD. Schedule an appointment as soon as possible for a visit in 2 week(s).   Specialty: Orthopedic Surgery Contact information: [address] [place] [address] [phone]                  Signed: Cassandria Anger 05/08/2023, 6:48 AM

## 2023-05-08 NOTE — Therapy (Signed)
OUTPATIENT PHYSICAL THERAPY LOWER EXTREMITY TREATMENT   Patient Name: Courtney Orozco MRN: [id number] DOB:25-Aug-1951, 72 y.o., female Today's Date: 05/08/2023  END OF SESSION:  PT End of Session - 05/08/23 1523     Visit Number 2    Number of Visits 12    Date for PT Re-Evaluation 06/01/23    PT Start Time 1521    PT Stop Time 1610    PT Time Calculation (min) 49 min    Equipment Utilized During Treatment Other (comment)   FWW   Activity Tolerance Patient tolerated treatment well    Behavior During Therapy WFL for tasks assessed/performed            Past Medical History:  Diagnosis Date   Anxiety    Arthritis    Back pain    Depression    Diabetes mellitus without complication (HCC)    no meds in 5 years    Gait instability    GERD (gastroesophageal reflux disease)    Headache    hx of migraines    Hepatitis    hx of hep B - 44 years ago    History of kidney stones    Hypertension    Hypothyroidism    Influenza A 10/20/2022   Memory loss    Pneumonia    hx of 01/2015    PONV (postoperative nausea and vomiting)    pt states "the last two times ive been put to sleep, my oxygen drops low and they have trouble waking me up.   Thyroid disease    TIA (transient ischemic attack)    Past Surgical History:  Procedure Laterality Date   ABDOMINAL HYSTERECTOMY     APPENDECTOMY     BACK SURGERY     x 4   bladder tack surgery      BREAST SURGERY Right    removal of 3 benign tumors   CATARACT EXTRACTION W/PHACO Left 04/30/2017   Procedure: CATARACT EXTRACTION PHACO AND INTRAOCULAR LENS PLACEMENT (IOC);  Surgeon: Gemma Payor, MD;  Location: [facility];  Service: Ophthalmology;  Laterality: Left;  CDE: 7.16   CATARACT EXTRACTION W/PHACO Right 05/14/2017   Procedure: CATARACT EXTRACTION PHACO AND INTRAOCULAR LENS PLACEMENT RIGHT EYE;  Surgeon: Gemma Payor, MD;  Location: [facility];  Service: Ophthalmology;  Laterality: Right;  CDE: 6.63   CHOLECYSTECTOMY N/A 08/21/2018    Procedure: LAPAROSCOPIC CHOLECYSTECTOMY;  Surgeon: Lucretia Roers, MD;  Location: [facility];  Service: General;  Laterality: N/A;   CYSTOSCOPY WITH STENT PLACEMENT Right 09/28/2017   Procedure: CYSTOSCOPY WITH STENT PLACEMENT;  Surgeon: Marcine Matar, MD;  Location: [facility];  Service: Urology;  Laterality: Right;   CYSTOSCOPY WITH URETEROSCOPY, STONE BASKETRY AND STENT PLACEMENT Right 04/06/2015   Procedure: CYSTOSCOPY WITH RIGHT URETEROSCOPY, STONE BASKETRY AND STENT PLACEMENT;  Surgeon: Bjorn Pippin, MD;  Location: [facility];  Service: Urology;  Laterality: Right;   CYSTOSCOPY/RETROGRADE/URETEROSCOPY Right 11/28/2017   Procedure: CYSTOSCOPY,  REMOVAL OF RIGHT URETERAL STENT, RIGHT RETROGRADE PYELOGRAM, RIGHT URETEROSCOPY, STONE BASKET EXTRACTION RIGHT URETERAL CALCULUS;  Surgeon: Malen Gauze, MD;  Location: [facility];  Service: Urology;  Laterality: Right;   HOLMIUM LASER APPLICATION Right 04/06/2015   Procedure: HOLMIUM LASER APPLICATION;  Surgeon: Bjorn Pippin, MD;  Location: [facility];  Service: Urology;  Laterality: Right;   INTRAMEDULLARY (IM) NAIL INTERTROCHANTERIC Right 10/11/2022   Procedure: INTRAMEDULLARY (IM) NAIL INTERTROCHANTERIC;  Surgeon: Oliver Barre, MD;  Location: [facility];  Service: Orthopedics;  Laterality: Right;   KNEE ARTHROSCOPY  WITH LATERAL MENISECTOMY Right 05/09/2022   Procedure: KNEE ARTHROSCOPY WITH LATERAL MENISECTOMY;  Surgeon: Vickki Hearing, MD;  Location: [facility];  Service: Orthopedics;  Laterality: Right;   KNEE SURGERY     left   ROTATOR CUFF REPAIR Left    thumb surgery     left   TOTAL KNEE ARTHROPLASTY Right 05/01/2023   Procedure: TOTAL KNEE ARTHROPLASTY;  Surgeon: Durene Romans, MD;  Location: [facility];  Service: Orthopedics;  Laterality: Right;   VAGINA SURGERY     Patient Active Problem List   Diagnosis Date Noted   S/P total knee arthroplasty, right 05/01/2023   S/P total knee replacement, right 05/01/2023   Displaced intertrochanteric fracture  of right femur, init (HCC) 10/10/2022   Closed displaced intertrochanteric fracture of right femur (HCC) 10/10/2022   Complex tear of lateral meniscus of right knee as current injury    Insomnia 04/26/2022   Chronic cystitis with hematuria 09/08/2020   Calculus of gallbladder without cholecystitis without obstruction 08/13/2018   Infection due to ESBL-producing Escherichia coli 07/08/2018   Sepsis secondary to UTI (HCC) 05/22/2018   GERD (gastroesophageal reflux disease) 05/22/2018   Hypothyroidism 05/22/2018   Type 2 diabetes mellitus (HCC) 05/22/2018   Anxiety and depression 05/22/2018   HCAP (healthcare-associated pneumonia) 05/22/2018   Chronic back pain 05/22/2018   Bacteremia 10/01/2017   Acute metabolic encephalopathy 09/30/2017   Shortness of breath 09/30/2017   ESBL E Coli- UTI (urinary tract infection) 09/28/2017   Leukocytosis 09/28/2017   Right ureteral stone    Frequent falls 08/15/2017   Hypotension due to drugs 02/16/2017   Syncope 01/22/2017   CKD (chronic kidney disease) stage 3, GFR 30-59 ml/min (HCC) 10/22/2015   Nephrolithiasis 04/07/2015   Renal stone 04/06/2015   Intertrigo 03/26/2015   Fasting hyperglycemia 01/25/2015   Controlled substance agreement signed 09/09/2013   Hyperlipidemia 12/27/2010   Hypertension 02/04/2010   History of colonic polyps 01/08/2001   Family history of malignant neoplasm of gastrointestinal tract 09/05/2000   Chronic hepatitis B with hepatic coma (HCC) 12/15/1999   Edema 08/09/1999   PCP: Theodoro Kos, MD  REFERRING PROVIDER: Cassandria Anger, PA-C   REFERRING DIAG: Unilateral primary osteoarthritis, right knee   THERAPY DIAG:  Acute pain of right knee  Stiffness of right knee, not elsewhere classified  Rationale for Evaluation and Treatment: Rehabilitation  ONSET DATE: 05/01/23  SUBJECTIVE:   SUBJECTIVE STATEMENT: Reports she is "hurting like Hell."  PERTINENT HISTORY: Hypertension, type 2 diabetes,  hepatitis B, chronic kidney disease, anxiety, depression, history of falling, current smoker, arthritis, chronic low back pain with multiple surgeries, and history of a TIA  PAIN:  Are you having pain? Yes: NPRS scale: "100"/10 Pain location: anterior right knee Pain description: "feels like someone has their hand in their trying to pull it out" Aggravating factors: standing, walking, transfers Relieving factors: medication and elevated her leg  PRECAUTIONS: Fall  RED FLAGS: None   WEIGHT BEARING RESTRICTIONS: No  FALLS:  Has patient fallen in last 6 months?  No, last fall was December 2023  LIVING ENVIRONMENT: Lives with: lives with their family Lives in: House/apartment Stairs: No Has following equipment at home: Environmental consultant - 2 wheeled, Environmental consultant - 4 wheeled, and Ramped entry  OCCUPATION: retired  PLOF: Independent  PATIENT GOALS: be able to go to church, cook and clean around her house, and improved mobility  NEXT MD VISIT: week of 05/14/23  OBJECTIVE:  PATIENT SURVEYS:  FOTO to be completed at first  follow up  COGNITION: Overall cognitive status: Within functional limits for tasks assessed     SENSATION: Light touch: diminished sensation in both lower extremities below the knee Patient reports numbness in both feet.   EDEMA:  Patient has compression stockings donned limiting lower extremity edema  POSTURE: rounded shoulders and forward head  PALPATION: TTP: right quadriceps and hip adductors  LOWER EXTREMITY ROM: patient reports that she is unable to lay supine due to previous back surgeries; measured in sitting  Active ROM Right eval Left eval  Hip flexion    Hip extension    Hip abduction    Hip adduction    Hip internal rotation    Hip external rotation    Knee flexion 71/ 77 (PROM) 129  Knee extension 24/ 14 (PROM) 5  Ankle dorsiflexion    Ankle plantarflexion    Ankle inversion    Ankle eversion     (Blank rows = not tested)  LOWER EXTREMITY  MMT:  MMT Right eval Left eval  Hip flexion    Hip extension    Hip abduction    Hip adduction    Hip internal rotation    Hip external rotation    Knee flexion    Knee extension    Ankle dorsiflexion    Ankle plantarflexion    Ankle inversion    Ankle eversion     (Blank rows = not tested)  LOWER EXTREMITY SPECIAL TESTS:  Not tested due to surgical condition  FUNCTIONAL TESTS:  Stand to sit transfer: required maximal UE support from armrests and NWB on RLE Stand to sit transfer: required maximal UE support from armrests and NWB on RLE   GAIT: Assistive device utilized: Environmental consultant - 2 wheeled Level of assistance: Modified independence Comments: step to pattern with poor foot clearance bilaterally and right knee flexed in stance phase  TODAY'S TREATMENT:                                                                                                                              DATE:  05/08/2023  EXERCISE LOG  Exercise Repetitions and Resistance Comments  Nustep L1, seat 11 x20 min                    Blank cell = exercise not performed today  Modalities  Date: 05/08/23 Unattended Estim: Knee, IFC, 15 mins, Pain and Edema Vaso: Knee, Low, 15 mins, Pain and Edema  PATIENT EDUCATION:  Education details: reviewed HEP, POC, healing, prognosis, and goals for therapy Person educated: Patient Education method: Explanation Education comprehension: verbalized understanding  HOME EXERCISE PROGRAM: Reviewed HEP   ASSESSMENT:  CLINICAL IMPRESSION: Patient presented in clinic with reports of increased pain starting over the weekend on 05/05/23. Patient limited with tolerance of therex today due to pain. Patient was able to tolerate seated Nustep but fearful of pain and activity for any other PT. Normal modalities response noted following removal of  the modalities. Patient requires sitting position due to PMH of lumbar surgeries and chronic pain.   OBJECTIVE IMPAIRMENTS: Abnormal  gait, decreased activity tolerance, decreased balance, decreased mobility, difficulty walking, decreased ROM, decreased strength, hypomobility, increased edema, impaired sensation, impaired tone, and pain.   ACTIVITY LIMITATIONS: carrying, lifting, sitting, standing, squatting, sleeping, transfers, and locomotion level  PARTICIPATION LIMITATIONS: meal prep, cleaning, laundry, shopping, community activity, and church  PERSONAL FACTORS: 3+ comorbidities: Hypertension, type 2 diabetes, hepatitis B, chronic kidney disease, anxiety, depression, history of falling, current smoker, arthritis, chronic low back pain with multiple surgeries, and history of a TIA  are also affecting patient's functional outcome.   REHAB POTENTIAL: Good  CLINICAL DECISION MAKING: Evolving/moderate complexity  EVALUATION COMPLEXITY: Moderate  GOALS: Goals reviewed with patient? Yes  LONG TERM GOALS: Target date: 06/01/23  Patient will be independent with her HEP. Baseline:  Goal status: INITIAL  2.  Patient will be able to transfer from sitting to standing with minimal to no upper extremity support for improved independence. Baseline:  Goal status: INITIAL  3.  Patient will be able to transfer from standing to sitting with no significant deviations for improved mobility. Baseline:  Goal status: INITIAL  4.  Patient will be able to demonstrate at least 120 degrees of right knee flexion for improved function navigating stairs. Baseline:  Goal status: INITIAL  5.  Patient will be able to demonstrate right knee extension within 5 degrees of neutral for improved gait mechanics. Baseline:  Goal status: INITIAL  6.  Patient will report being able to attend church without being limited by her right knee for improved community mobility.  Baseline:  Goal status: INITIAL  PLAN:  PT FREQUENCY: 2-3x/week  PT DURATION: 4 weeks  PLANNED INTERVENTIONS: Therapeutic exercises, Therapeutic activity, Neuromuscular  re-education, Balance training, Gait training, Patient/Family education, Self Care, Joint mobilization, Stair training, Electrical stimulation, Cryotherapy, Moist heat, Vasopneumatic device, Manual therapy, and Re-evaluation  PLAN FOR NEXT SESSION: NuStep, quad set, heel slides, gastrocnemius and hamstring stretching, PROM, and modalities as needed (patient requests to avoid the bike due to low back pain)   Marvell Fuller, PTA 05/08/2023, 5:04 PM

## 2023-05-10 ENCOUNTER — Ambulatory Visit: Payer: PPO

## 2023-05-10 DIAGNOSIS — M25661 Stiffness of right knee, not elsewhere classified: Secondary | ICD-10-CM

## 2023-05-10 DIAGNOSIS — M25561 Pain in right knee: Secondary | ICD-10-CM | POA: Diagnosis not present

## 2023-05-10 NOTE — Therapy (Signed)
OUTPATIENT PHYSICAL THERAPY LOWER EXTREMITY TREATMENT   Patient Name: Courtney Orozco MRN: [id number] DOB:[birth year], 72 y.o., female Today's Date: 05/10/2023  END OF SESSION:  PT End of Session - 05/10/23 1614     Visit Number 3    Number of Visits 12    Date for PT Re-Evaluation 06/01/23    PT Start Time 1515    PT Stop Time 1608    PT Time Calculation (min) 53 min    Equipment Utilized During Treatment Other (comment)   FWW   Activity Tolerance Patient tolerated treatment well    Behavior During Therapy WFL for tasks assessed/performed             Past Medical History:  Diagnosis Date   Anxiety    Arthritis    Back pain    Depression    Diabetes mellitus without complication (HCC)    no meds in 5 years    Gait instability    GERD (gastroesophageal reflux disease)    Headache    hx of migraines    Hepatitis    hx of hep B - 44 years ago    History of kidney stones    Hypertension    Hypothyroidism    Influenza A 10/20/2022   Memory loss    Pneumonia    hx of 01/2015    PONV (postoperative nausea and vomiting)    pt states "the last two times ive been put to sleep, my oxygen drops low and they have trouble waking me up.   Thyroid disease    TIA (transient ischemic attack)    Past Surgical History:  Procedure Laterality Date   ABDOMINAL HYSTERECTOMY     APPENDECTOMY     BACK SURGERY     x 4   bladder tack surgery      BREAST SURGERY Right    removal of 3 benign tumors   CATARACT EXTRACTION W/PHACO Left 04/30/2017   Procedure: CATARACT EXTRACTION PHACO AND INTRAOCULAR LENS PLACEMENT (IOC);  Surgeon: Gemma Payor, MD;  Location: [facility];  Service: Ophthalmology;  Laterality: Left;  CDE: 7.16   CATARACT EXTRACTION W/PHACO Right 05/14/2017   Procedure: CATARACT EXTRACTION PHACO AND INTRAOCULAR LENS PLACEMENT RIGHT EYE;  Surgeon: Gemma Payor, MD;  Location: [facility];  Service: Ophthalmology;  Laterality: Right;  CDE: 6.63   CHOLECYSTECTOMY N/A 08/21/2018    Procedure: LAPAROSCOPIC CHOLECYSTECTOMY;  Surgeon: Lucretia Roers, MD;  Location: [facility];  Service: General;  Laterality: N/A;   CYSTOSCOPY WITH STENT PLACEMENT Right 09/28/2017   Procedure: CYSTOSCOPY WITH STENT PLACEMENT;  Surgeon: Marcine Matar, MD;  Location: [facility];  Service: Urology;  Laterality: Right;   CYSTOSCOPY WITH URETEROSCOPY, STONE BASKETRY AND STENT PLACEMENT Right 04/06/2015   Procedure: CYSTOSCOPY WITH RIGHT URETEROSCOPY, STONE BASKETRY AND STENT PLACEMENT;  Surgeon: Bjorn Pippin, MD;  Location: [facility];  Service: Urology;  Laterality: Right;   CYSTOSCOPY/RETROGRADE/URETEROSCOPY Right 11/28/2017   Procedure: CYSTOSCOPY,  REMOVAL OF RIGHT URETERAL STENT, RIGHT RETROGRADE PYELOGRAM, RIGHT URETEROSCOPY, STONE BASKET EXTRACTION RIGHT URETERAL CALCULUS;  Surgeon: Malen Gauze, MD;  Location: [facility];  Service: Urology;  Laterality: Right;   HOLMIUM LASER APPLICATION Right 04/06/2015   Procedure: HOLMIUM LASER APPLICATION;  Surgeon: Bjorn Pippin, MD;  Location: [facility];  Service: Urology;  Laterality: Right;   INTRAMEDULLARY (IM) NAIL INTERTROCHANTERIC Right 10/11/2022   Procedure: INTRAMEDULLARY (IM) NAIL INTERTROCHANTERIC;  Surgeon: Oliver Barre, MD;  Location: [facility];  Service: Orthopedics;  Laterality: Right;   KNEE  ARTHROSCOPY WITH LATERAL MENISECTOMY Right 05/09/2022   Procedure: KNEE ARTHROSCOPY WITH LATERAL MENISECTOMY;  Surgeon: Vickki Hearing, MD;  Location: [facility];  Service: Orthopedics;  Laterality: Right;   KNEE SURGERY     left   ROTATOR CUFF REPAIR Left    thumb surgery     left   TOTAL KNEE ARTHROPLASTY Right 05/01/2023   Procedure: TOTAL KNEE ARTHROPLASTY;  Surgeon: Durene Romans, MD;  Location: [facility];  Service: Orthopedics;  Laterality: Right;   VAGINA SURGERY     Patient Active Problem List   Diagnosis Date Noted   S/P total knee arthroplasty, right 05/01/2023   S/P total knee replacement, right 05/01/2023   Displaced intertrochanteric fracture  of right femur, init (HCC) 10/10/2022   Closed displaced intertrochanteric fracture of right femur (HCC) 10/10/2022   Complex tear of lateral meniscus of right knee as current injury    Insomnia 04/26/2022   Chronic cystitis with hematuria 09/08/2020   Calculus of gallbladder without cholecystitis without obstruction 08/13/2018   Infection due to ESBL-producing Escherichia coli 07/08/2018   Sepsis secondary to UTI (HCC) 05/22/2018   GERD (gastroesophageal reflux disease) 05/22/2018   Hypothyroidism 05/22/2018   Type 2 diabetes mellitus (HCC) 05/22/2018   Anxiety and depression 05/22/2018   HCAP (healthcare-associated pneumonia) 05/22/2018   Chronic back pain 05/22/2018   Bacteremia 10/01/2017   Acute metabolic encephalopathy 09/30/2017   Shortness of breath 09/30/2017   ESBL E Coli- UTI (urinary tract infection) 09/28/2017   Leukocytosis 09/28/2017   Right ureteral stone    Frequent falls 08/15/2017   Hypotension due to drugs 02/16/2017   Syncope 01/22/2017   CKD (chronic kidney disease) stage 3, GFR 30-59 ml/min (HCC) 10/22/2015   Nephrolithiasis 04/07/2015   Renal stone 04/06/2015   Intertrigo 03/26/2015   Fasting hyperglycemia 01/25/2015   Controlled substance agreement signed 09/09/2013   Hyperlipidemia 12/27/2010   Hypertension 02/04/2010   History of colonic polyps 01/08/2001   Family history of malignant neoplasm of gastrointestinal tract 09/05/2000   Chronic hepatitis B with hepatic coma (HCC) 12/15/1999   Edema 08/09/1999   PCP: Theodoro Kos, MD  REFERRING PROVIDER: Cassandria Anger, PA-C   REFERRING DIAG: Unilateral primary osteoarthritis, right knee   THERAPY DIAG:  Acute pain of right knee  Stiffness of right knee, not elsewhere classified  Rationale for Evaluation and Treatment: Rehabilitation  ONSET DATE: 05/01/23  SUBJECTIVE:   SUBJECTIVE STATEMENT: Patient reports that she feels better today than she did at her last appointment.   PERTINENT  HISTORY: Hypertension, type 2 diabetes, hepatitis B, chronic kidney disease, anxiety, depression, history of falling, current smoker, arthritis, chronic low back pain with multiple surgeries, and history of a TIA  PAIN:  Are you having pain? Yes: NPRS scale: 7/10 Pain location: anterior right knee Pain description: "feels like someone has their hand in their trying to pull it out" Aggravating factors: standing, walking, transfers Relieving factors: medication and elevated her leg  PRECAUTIONS: Fall  RED FLAGS: None   WEIGHT BEARING RESTRICTIONS: No  FALLS:  Has patient fallen in last 6 months?  No, last fall was December 2023  LIVING ENVIRONMENT: Lives with: lives with their family Lives in: House/apartment Stairs: No Has following equipment at home: Environmental consultant - 2 wheeled, Environmental consultant - 4 wheeled, and Ramped entry  OCCUPATION: retired  PLOF: Independent  PATIENT GOALS: be able to go to church, cook and clean around her house, and improved mobility  NEXT MD VISIT: week of 05/14/23  OBJECTIVE:  PATIENT SURVEYS:  FOTO to be completed at first follow up  COGNITION: Overall cognitive status: Within functional limits for tasks assessed     SENSATION: Light touch: diminished sensation in both lower extremities below the knee Patient reports numbness in both feet.   EDEMA:  Patient has compression stockings donned limiting lower extremity edema  POSTURE: rounded shoulders and forward head  PALPATION: TTP: right quadriceps and hip adductors  LOWER EXTREMITY ROM: patient reports that she is unable to lay supine due to previous back surgeries; measured in sitting  Active ROM Right eval Left eval  Hip flexion    Hip extension    Hip abduction    Hip adduction    Hip internal rotation    Hip external rotation    Knee flexion 71/ 77 (PROM) 129  Knee extension 24/ 14 (PROM) 5  Ankle dorsiflexion    Ankle plantarflexion    Ankle inversion    Ankle eversion     (Blank rows  = not tested)  LOWER EXTREMITY MMT:  MMT Right eval Left eval  Hip flexion    Hip extension    Hip abduction    Hip adduction    Hip internal rotation    Hip external rotation    Knee flexion    Knee extension    Ankle dorsiflexion    Ankle plantarflexion    Ankle inversion    Ankle eversion     (Blank rows = not tested)  LOWER EXTREMITY SPECIAL TESTS:  Not tested due to surgical condition  FUNCTIONAL TESTS:  Stand to sit transfer: required maximal UE support from armrests and NWB on RLE Stand to sit transfer: required maximal UE support from armrests and NWB on RLE   GAIT: Assistive device utilized: Environmental consultant - 2 wheeled Level of assistance: Modified independence Comments: step to pattern with poor foot clearance bilaterally and right knee flexed in stance phase  TODAY'S TREATMENT:                                                                                                                              DATE:                                   05/10/23 EXERCISE LOG  Exercise Repetitions and Resistance Comments  Nustep  L3 x 15 minutes; seat 8   Standing gastroc stretch  2 minutes   Seated heel slide 2 minutes    LAQ` 25 reps   Seated marching  12 reps     Blank cell = exercise not performed today  Modalities: no redness or adverse reaction to today's modalities  Date:  Unattended Estim: right quadriceps , pre mod @ 80-150 Hz, 15 mins, Pain and Edema Vaso: Knee, 34 degrees; low pressure, 15 mins, Pain and Edema    05/08/2023  EXERCISE LOG  Exercise Repetitions and Resistance Comments  Nustep L1, seat 11 x20 min                    Blank cell = exercise not performed today  Modalities  Date: 05/08/23 Unattended Estim: Knee, IFC, 15 mins, Pain and Edema Vaso: Knee, Low, 15 mins, Pain and Edema  PATIENT EDUCATION:  Education details: reviewed HEP, POC, healing, prognosis, and goals for therapy Person educated: Patient Education method: Explanation Education  comprehension: verbalized understanding  HOME EXERCISE PROGRAM: Reviewed HEP   ASSESSMENT:  CLINICAL IMPRESSION: Patient presented to treatment with reduced pain since her last appointment. She was introduced to multiple new interventions for improved knee mobility. She required minimal cueing with today's interventions for proper exercise performance. She exhibited no significant increase in pain or discomfort with any of today's interventions. She reported that her knee felt better upon the conclusion of treatment. She continues to require skilled physical therapy to address her remaining impairments to return to her prior level of function.   OBJECTIVE IMPAIRMENTS: Abnormal gait, decreased activity tolerance, decreased balance, decreased mobility, difficulty walking, decreased ROM, decreased strength, hypomobility, increased edema, impaired sensation, impaired tone, and pain.   ACTIVITY LIMITATIONS: carrying, lifting, sitting, standing, squatting, sleeping, transfers, and locomotion level  PARTICIPATION LIMITATIONS: meal prep, cleaning, laundry, shopping, community activity, and church  PERSONAL FACTORS: 3+ comorbidities: Hypertension, type 2 diabetes, hepatitis B, chronic kidney disease, anxiety, depression, history of falling, current smoker, arthritis, chronic low back pain with multiple surgeries, and history of a TIA  are also affecting patient's functional outcome.   REHAB POTENTIAL: Good  CLINICAL DECISION MAKING: Evolving/moderate complexity  EVALUATION COMPLEXITY: Moderate  GOALS: Goals reviewed with patient? Yes  LONG TERM GOALS: Target date: 06/01/23  Patient will be independent with her HEP. Baseline:  Goal status: INITIAL  2.  Patient will be able to transfer from sitting to standing with minimal to no upper extremity support for improved independence. Baseline:  Goal status: INITIAL  3.  Patient will be able to transfer from standing to sitting with no significant  deviations for improved mobility. Baseline:  Goal status: INITIAL  4.  Patient will be able to demonstrate at least 120 degrees of right knee flexion for improved function navigating stairs. Baseline:  Goal status: INITIAL  5.  Patient will be able to demonstrate right knee extension within 5 degrees of neutral for improved gait mechanics. Baseline:  Goal status: INITIAL  6.  Patient will report being able to attend church without being limited by her right knee for improved community mobility.  Baseline:  Goal status: INITIAL  PLAN:  PT FREQUENCY: 2-3x/week  PT DURATION: 4 weeks  PLANNED INTERVENTIONS: Therapeutic exercises, Therapeutic activity, Neuromuscular re-education, Balance training, Gait training, Patient/Family education, Self Care, Joint mobilization, Stair training, Electrical stimulation, Cryotherapy, Moist heat, Vasopneumatic device, Manual therapy, and Re-evaluation  PLAN FOR NEXT SESSION: NuStep, quad set, heel slides, gastrocnemius and hamstring stretching, PROM, and modalities as needed (patient requests to avoid the bike due to low back pain)   Granville Lewis, PT 05/10/2023, 4:22 PM

## 2023-05-15 ENCOUNTER — Ambulatory Visit: Payer: PPO | Admitting: Physical Therapy

## 2023-05-22 ENCOUNTER — Encounter: Payer: Self-pay | Admitting: Physical Therapy

## 2023-05-22 ENCOUNTER — Ambulatory Visit: Payer: PPO | Admitting: Physical Therapy

## 2023-05-22 DIAGNOSIS — M25561 Pain in right knee: Secondary | ICD-10-CM | POA: Diagnosis not present

## 2023-05-22 DIAGNOSIS — M25661 Stiffness of right knee, not elsewhere classified: Secondary | ICD-10-CM

## 2023-05-22 NOTE — Therapy (Signed)
OUTPATIENT PHYSICAL THERAPY LOWER EXTREMITY TREATMENT   Patient Name: Courtney Orozco MRN: [id number] DOB:1950-12-27, 72 y.o., female Today's Date: 05/22/2023  END OF SESSION:  PT End of Session - 05/22/23 1339     Visit Number 4    Number of Visits 12    Date for PT Re-Evaluation 06/01/23    PT Start Time 1335    PT Stop Time 1420    PT Time Calculation (min) 45 min    Equipment Utilized During Treatment Other (comment)   FWW   Activity Tolerance Patient tolerated treatment well    Behavior During Therapy WFL for tasks assessed/performed            Past Medical History:  Diagnosis Date   Anxiety    Arthritis    Back pain    Depression    Diabetes mellitus without complication (HCC)    no meds in 5 years    Gait instability    GERD (gastroesophageal reflux disease)    Headache    hx of migraines    Hepatitis    hx of hep B - 44 years ago    History of kidney stones    Hypertension    Hypothyroidism    Influenza A 10/20/2022   Memory loss    Pneumonia    hx of 01/2015    PONV (postoperative nausea and vomiting)    pt states "the last two times ive been put to sleep, my oxygen drops low and they have trouble waking me up.   Thyroid disease    TIA (transient ischemic attack)    Past Surgical History:  Procedure Laterality Date   ABDOMINAL HYSTERECTOMY     APPENDECTOMY     BACK SURGERY     x 4   bladder tack surgery      BREAST SURGERY Right    removal of 3 benign tumors   CATARACT EXTRACTION W/PHACO Left 04/30/2017   Procedure: CATARACT EXTRACTION PHACO AND INTRAOCULAR LENS PLACEMENT (IOC);  Surgeon: Gemma Payor, MD;  Location: [facility];  Service: Ophthalmology;  Laterality: Left;  CDE: 7.16   CATARACT EXTRACTION W/PHACO Right 05/14/2017   Procedure: CATARACT EXTRACTION PHACO AND INTRAOCULAR LENS PLACEMENT RIGHT EYE;  Surgeon: Gemma Payor, MD;  Location: [facility];  Service: Ophthalmology;  Laterality: Right;  CDE: 6.63   CHOLECYSTECTOMY N/A 08/21/2018    Procedure: LAPAROSCOPIC CHOLECYSTECTOMY;  Surgeon: Lucretia Roers, MD;  Location: [facility];  Service: General;  Laterality: N/A;   CYSTOSCOPY WITH STENT PLACEMENT Right 09/28/2017   Procedure: CYSTOSCOPY WITH STENT PLACEMENT;  Surgeon: Marcine Matar, MD;  Location: [facility];  Service: Urology;  Laterality: Right;   CYSTOSCOPY WITH URETEROSCOPY, STONE BASKETRY AND STENT PLACEMENT Right 04/06/2015   Procedure: CYSTOSCOPY WITH RIGHT URETEROSCOPY, STONE BASKETRY AND STENT PLACEMENT;  Surgeon: Bjorn Pippin, MD;  Location: [facility];  Service: Urology;  Laterality: Right;   CYSTOSCOPY/RETROGRADE/URETEROSCOPY Right 11/28/2017   Procedure: CYSTOSCOPY,  REMOVAL OF RIGHT URETERAL STENT, RIGHT RETROGRADE PYELOGRAM, RIGHT URETEROSCOPY, STONE BASKET EXTRACTION RIGHT URETERAL CALCULUS;  Surgeon: Malen Gauze, MD;  Location: [facility];  Service: Urology;  Laterality: Right;   HOLMIUM LASER APPLICATION Right 04/06/2015   Procedure: HOLMIUM LASER APPLICATION;  Surgeon: Bjorn Pippin, MD;  Location: [facility];  Service: Urology;  Laterality: Right;   INTRAMEDULLARY (IM) NAIL INTERTROCHANTERIC Right 10/11/2022   Procedure: INTRAMEDULLARY (IM) NAIL INTERTROCHANTERIC;  Surgeon: Oliver Barre, MD;  Location: [facility];  Service: Orthopedics;  Laterality: Right;   KNEE ARTHROSCOPY  WITH LATERAL MENISECTOMY Right 05/09/2022   Procedure: KNEE ARTHROSCOPY WITH LATERAL MENISECTOMY;  Surgeon: Vickki Hearing, MD;  Location: [facility];  Service: Orthopedics;  Laterality: Right;   KNEE SURGERY     left   ROTATOR CUFF REPAIR Left    thumb surgery     left   TOTAL KNEE ARTHROPLASTY Right 05/01/2023   Procedure: TOTAL KNEE ARTHROPLASTY;  Surgeon: Durene Romans, MD;  Location: [facility];  Service: Orthopedics;  Laterality: Right;   VAGINA SURGERY     Patient Active Problem List   Diagnosis Date Noted   S/P total knee arthroplasty, right 05/01/2023   S/P total knee replacement, right 05/01/2023   Displaced intertrochanteric fracture  of right femur, init (HCC) 10/10/2022   Closed displaced intertrochanteric fracture of right femur (HCC) 10/10/2022   Complex tear of lateral meniscus of right knee as current injury    Insomnia 04/26/2022   Chronic cystitis with hematuria 09/08/2020   Calculus of gallbladder without cholecystitis without obstruction 08/13/2018   Infection due to ESBL-producing Escherichia coli 07/08/2018   Sepsis secondary to UTI (HCC) 05/22/2018   GERD (gastroesophageal reflux disease) 05/22/2018   Hypothyroidism 05/22/2018   Type 2 diabetes mellitus (HCC) 05/22/2018   Anxiety and depression 05/22/2018   HCAP (healthcare-associated pneumonia) 05/22/2018   Chronic back pain 05/22/2018   Bacteremia 10/01/2017   Acute metabolic encephalopathy 09/30/2017   Shortness of breath 09/30/2017   ESBL E Coli- UTI (urinary tract infection) 09/28/2017   Leukocytosis 09/28/2017   Right ureteral stone    Frequent falls 08/15/2017   Hypotension due to drugs 02/16/2017   Syncope 01/22/2017   CKD (chronic kidney disease) stage 3, GFR 30-59 ml/min (HCC) 10/22/2015   Nephrolithiasis 04/07/2015   Renal stone 04/06/2015   Intertrigo 03/26/2015   Fasting hyperglycemia 01/25/2015   Controlled substance agreement signed 09/09/2013   Hyperlipidemia 12/27/2010   Hypertension 02/04/2010   History of colonic polyps 01/08/2001   Family history of malignant neoplasm of gastrointestinal tract 09/05/2000   Chronic hepatitis B with hepatic coma (HCC) 12/15/1999   Edema 08/09/1999   PCP: Theodoro Kos, MD  REFERRING PROVIDER: Cassandria Anger, PA-C   REFERRING DIAG: Unilateral primary osteoarthritis, right knee   THERAPY DIAG:  Acute pain of right knee  Stiffness of right knee, not elsewhere classified  Rationale for Evaluation and Treatment: Rehabilitation  ONSET DATE: 05/01/23  SUBJECTIVE:   SUBJECTIVE STATEMENT: Reports falling more on L side on 05/18/23 and is sore. Patient reports being given Flexeril for  the muscle spasms she has been having as well as tibial pain.  PERTINENT HISTORY: Hypertension, type 2 diabetes, hepatitis B, chronic kidney disease, anxiety, depression, history of falling, current smoker, arthritis, chronic low back pain with multiple surgeries, and history of a TIA  PAIN:  Are you having pain? Yes: NPRS scale: 8/10 Pain location: anterior right knee Pain description: "feels like someone has their hand in their trying to pull it out" Aggravating factors: standing, walking, transfers Relieving factors: medication and elevated her leg  PRECAUTIONS: Fall  RED FLAGS: None   WEIGHT BEARING RESTRICTIONS: No  FALLS:  Has patient fallen in last 6 months?  1, 05/18/2023  PATIENT GOALS: be able to go to church, cook and clean around her house, and improved mobility  NEXT MD VISIT: week of 05/14/23  OBJECTIVE:  PATIENT SURVEYS:  FOTO to be completed at first follow up  COGNITION: Overall cognitive status: Within functional limits for tasks assessed  SENSATION: Light touch: diminished sensation in both lower extremities below the knee Patient reports numbness in both feet.   EDEMA:  Patient has compression stockings donned limiting lower extremity edema  POSTURE: rounded shoulders and forward head  PALPATION: TTP: right quadriceps and hip adductors  LOWER EXTREMITY ROM: patient reports that she is unable to lay supine due to previous back surgeries; measured in sitting  Active ROM Right eval Left eval Right AROM 05/22/2023  Hip flexion     Hip extension     Hip abduction     Hip adduction     Hip internal rotation     Hip external rotation     Knee flexion 71/ 77 (PROM) 129 102  Knee extension 24/ 14 (PROM) 5 9  Ankle dorsiflexion     Ankle plantarflexion     Ankle inversion     Ankle eversion      (Blank rows = not tested)  LOWER EXTREMITY MMT:  MMT Right eval Left eval  Hip flexion    Hip extension    Hip abduction    Hip adduction     Hip internal rotation    Hip external rotation    Knee flexion    Knee extension    Ankle dorsiflexion    Ankle plantarflexion    Ankle inversion    Ankle eversion     (Blank rows = not tested)  LOWER EXTREMITY SPECIAL TESTS:  Not tested due to surgical condition  FUNCTIONAL TESTS:  Stand to sit transfer: required maximal UE support from armrests and NWB on RLE Stand to sit transfer: required maximal UE support from armrests and NWB on RLE   GAIT: Assistive device utilized: Environmental consultant - 2 wheeled Level of assistance: Modified independence Comments: step to pattern with poor foot clearance bilaterally and right knee flexed in stance phase  TODAY'S TREATMENT:                                                                                                                              DATE: 05/22/23 EXERCISE LOG  Exercise Repetitions and Resistance Comments  Nustep  L3 x 15 minutes; seat 8   Seated heel raises X20 reps   Seated heel slide 2 minutes    LAQ` AROM x25 reps   Seated marching  20 reps    Blank cell = exercise not performed today   Modalities: no redness or adverse reaction to today's modalities  Date: 05/22/2023 Unattended Estim: R knee, IFC, 10 mins, Pain and Edema Vaso: Knee, Low, 10 mins, Pain and Edema  PATIENT EDUCATION:  Education details: reviewed HEP, POC, healing, prognosis, and goals for therapy Person educated: Patient Education method: Explanation Education comprehension: verbalized understanding  HOME EXERCISE PROGRAM: Reviewed HEP   ASSESSMENT:  CLINICAL IMPRESSION: Patient presented in clinic with reports of increased soreness from a fall which she landed on mostly L side as well as postsurgical healing. Patient also indicates that she  is still bruised and sore along R thigh as well from the tourniquet. Patient able to tolerate therex fairly well today with mod extensor lag noted with LAQ. AROM of R knee measured with improvement as well. Normal  modalities response noted following removal of the modalities.  OBJECTIVE IMPAIRMENTS: Abnormal gait, decreased activity tolerance, decreased balance, decreased mobility, difficulty walking, decreased ROM, decreased strength, hypomobility, increased edema, impaired sensation, impaired tone, and pain.   ACTIVITY LIMITATIONS: carrying, lifting, sitting, standing, squatting, sleeping, transfers, and locomotion level  PARTICIPATION LIMITATIONS: meal prep, cleaning, laundry, shopping, community activity, and church  PERSONAL FACTORS: 3+ comorbidities: Hypertension, type 2 diabetes, hepatitis B, chronic kidney disease, anxiety, depression, history of falling, current smoker, arthritis, chronic low back pain with multiple surgeries, and history of a TIA  are also affecting patient's functional outcome.   REHAB POTENTIAL: Good  CLINICAL DECISION MAKING: Evolving/moderate complexity  EVALUATION COMPLEXITY: Moderate  GOALS: Goals reviewed with patient? Yes  LONG TERM GOALS: Target date: 06/01/23  Patient will be independent with her HEP. Baseline:  Goal status: On-going  2.  Patient will be able to transfer from sitting to standing with minimal to no upper extremity support for improved independence. Baseline:  Goal status: On-going  3.  Patient will be able to transfer from standing to sitting with no significant deviations for improved mobility. Baseline:  Goal status: On-going  4.  Patient will be able to demonstrate at least 120 degrees of right knee flexion for improved function navigating stairs. Baseline:  Goal status: On-going  5.  Patient will be able to demonstrate right knee extension within 5 degrees of neutral for improved gait mechanics. Baseline:  Goal status: On-going   6.  Patient will report being able to attend church without being limited by her right knee for improved community mobility.  Baseline:  Goal status: On-going  PLAN:  PT FREQUENCY: 2-3x/week  PT  DURATION: 4 weeks  PLANNED INTERVENTIONS: Therapeutic exercises, Therapeutic activity, Neuromuscular re-education, Balance training, Gait training, Patient/Family education, Self Care, Joint mobilization, Stair training, Electrical stimulation, Cryotherapy, Moist heat, Vasopneumatic device, Manual therapy, and Re-evaluation  PLAN FOR NEXT SESSION: NuStep, quad set, heel slides, gastrocnemius and hamstring stretching, PROM, and modalities as needed (patient requests to avoid the bike due to low back pain)   Marvell Fuller, PTA 05/22/2023, 2:23 PM

## 2023-05-24 ENCOUNTER — Encounter: Payer: Self-pay | Admitting: Physical Therapy

## 2023-05-24 ENCOUNTER — Ambulatory Visit: Payer: PPO | Attending: Student | Admitting: Physical Therapy

## 2023-05-24 DIAGNOSIS — M25561 Pain in right knee: Secondary | ICD-10-CM | POA: Diagnosis present

## 2023-05-24 DIAGNOSIS — M25661 Stiffness of right knee, not elsewhere classified: Secondary | ICD-10-CM | POA: Insufficient documentation

## 2023-05-24 NOTE — Therapy (Signed)
OUTPATIENT PHYSICAL THERAPY LOWER EXTREMITY TREATMENT   Patient Name: Courtney Orozco MRN: [id number] DOB:1950/12/30, 72 y.o., female Today's Date: 05/24/2023  END OF SESSION:  PT End of Session - 05/24/23 1340     Visit Number 5    Number of Visits 12    Date for PT Re-Evaluation 06/01/23    PT Start Time 1348    PT Stop Time 1435    PT Time Calculation (min) 47 min    Equipment Utilized During Treatment Other (comment)   FWW   Activity Tolerance Patient tolerated treatment well    Behavior During Therapy WFL for tasks assessed/performed            Past Medical History:  Diagnosis Date   Anxiety    Arthritis    Back pain    Depression    Diabetes mellitus without complication (HCC)    no meds in 5 years    Gait instability    GERD (gastroesophageal reflux disease)    Headache    hx of migraines    Hepatitis    hx of hep B - 44 years ago    History of kidney stones    Hypertension    Hypothyroidism    Influenza A 10/20/2022   Memory loss    Pneumonia    hx of 01/2015    PONV (postoperative nausea and vomiting)    pt states "the last two times ive been put to sleep, my oxygen drops low and they have trouble waking me up.   Thyroid disease    TIA (transient ischemic attack)    Past Surgical History:  Procedure Laterality Date   ABDOMINAL HYSTERECTOMY     APPENDECTOMY     BACK SURGERY     x 4   bladder tack surgery      BREAST SURGERY Right    removal of 3 benign tumors   CATARACT EXTRACTION W/PHACO Left 04/30/2017   Procedure: CATARACT EXTRACTION PHACO AND INTRAOCULAR LENS PLACEMENT (IOC);  Surgeon: Gemma Payor, MD;  Location: [facility];  Service: Ophthalmology;  Laterality: Left;  CDE: 7.16   CATARACT EXTRACTION W/PHACO Right 05/14/2017   Procedure: CATARACT EXTRACTION PHACO AND INTRAOCULAR LENS PLACEMENT RIGHT EYE;  Surgeon: Gemma Payor, MD;  Location: [facility];  Service: Ophthalmology;  Laterality: Right;  CDE: 6.63   CHOLECYSTECTOMY N/A 08/21/2018    Procedure: LAPAROSCOPIC CHOLECYSTECTOMY;  Surgeon: Lucretia Roers, MD;  Location: [facility];  Service: General;  Laterality: N/A;   CYSTOSCOPY WITH STENT PLACEMENT Right 09/28/2017   Procedure: CYSTOSCOPY WITH STENT PLACEMENT;  Surgeon: Marcine Matar, MD;  Location: [facility];  Service: Urology;  Laterality: Right;   CYSTOSCOPY WITH URETEROSCOPY, STONE BASKETRY AND STENT PLACEMENT Right 04/06/2015   Procedure: CYSTOSCOPY WITH RIGHT URETEROSCOPY, STONE BASKETRY AND STENT PLACEMENT;  Surgeon: Bjorn Pippin, MD;  Location: [facility];  Service: Urology;  Laterality: Right;   CYSTOSCOPY/RETROGRADE/URETEROSCOPY Right 11/28/2017   Procedure: CYSTOSCOPY,  REMOVAL OF RIGHT URETERAL STENT, RIGHT RETROGRADE PYELOGRAM, RIGHT URETEROSCOPY, STONE BASKET EXTRACTION RIGHT URETERAL CALCULUS;  Surgeon: Malen Gauze, MD;  Location: [facility];  Service: Urology;  Laterality: Right;   HOLMIUM LASER APPLICATION Right 04/06/2015   Procedure: HOLMIUM LASER APPLICATION;  Surgeon: Bjorn Pippin, MD;  Location: [facility];  Service: Urology;  Laterality: Right;   INTRAMEDULLARY (IM) NAIL INTERTROCHANTERIC Right 10/11/2022   Procedure: INTRAMEDULLARY (IM) NAIL INTERTROCHANTERIC;  Surgeon: Oliver Barre, MD;  Location: [facility];  Service: Orthopedics;  Laterality: Right;   KNEE ARTHROSCOPY  WITH LATERAL MENISECTOMY Right 05/09/2022   Procedure: KNEE ARTHROSCOPY WITH LATERAL MENISECTOMY;  Surgeon: Vickki Hearing, MD;  Location: [facility];  Service: Orthopedics;  Laterality: Right;   KNEE SURGERY     left   ROTATOR CUFF REPAIR Left    thumb surgery     left   TOTAL KNEE ARTHROPLASTY Right 05/01/2023   Procedure: TOTAL KNEE ARTHROPLASTY;  Surgeon: Durene Romans, MD;  Location: [facility];  Service: Orthopedics;  Laterality: Right;   VAGINA SURGERY     Patient Active Problem List   Diagnosis Date Noted   S/P total knee arthroplasty, right 05/01/2023   S/P total knee replacement, right 05/01/2023   Displaced intertrochanteric fracture  of right femur, init (HCC) 10/10/2022   Closed displaced intertrochanteric fracture of right femur (HCC) 10/10/2022   Complex tear of lateral meniscus of right knee as current injury    Insomnia 04/26/2022   Chronic cystitis with hematuria 09/08/2020   Calculus of gallbladder without cholecystitis without obstruction 08/13/2018   Infection due to ESBL-producing Escherichia coli 07/08/2018   Sepsis secondary to UTI (HCC) 05/22/2018   GERD (gastroesophageal reflux disease) 05/22/2018   Hypothyroidism 05/22/2018   Type 2 diabetes mellitus (HCC) 05/22/2018   Anxiety and depression 05/22/2018   HCAP (healthcare-associated pneumonia) 05/22/2018   Chronic back pain 05/22/2018   Bacteremia 10/01/2017   Acute metabolic encephalopathy 09/30/2017   Shortness of breath 09/30/2017   ESBL E Coli- UTI (urinary tract infection) 09/28/2017   Leukocytosis 09/28/2017   Right ureteral stone    Frequent falls 08/15/2017   Hypotension due to drugs 02/16/2017   Syncope 01/22/2017   CKD (chronic kidney disease) stage 3, GFR 30-59 ml/min (HCC) 10/22/2015   Nephrolithiasis 04/07/2015   Renal stone 04/06/2015   Intertrigo 03/26/2015   Fasting hyperglycemia 01/25/2015   Controlled substance agreement signed 09/09/2013   Hyperlipidemia 12/27/2010   Hypertension 02/04/2010   History of colonic polyps 01/08/2001   Family history of malignant neoplasm of gastrointestinal tract 09/05/2000   Chronic hepatitis B with hepatic coma (HCC) 12/15/1999   Edema 08/09/1999   PCP: Theodoro Kos, MD  REFERRING PROVIDER: Cassandria Anger, PA-C   REFERRING DIAG: Unilateral primary osteoarthritis, right knee   THERAPY DIAG:  Acute pain of right knee  Stiffness of right knee, not elsewhere classified  Rationale for Evaluation and Treatment: Rehabilitation  ONSET DATE: 05/01/23  SUBJECTIVE:   SUBJECTIVE STATEMENT: Reports that she tried to do some housework and walked around her yard yesterday. Tried to do  some housework prior to PT as well but is sore from two days of more activity.  PERTINENT HISTORY: Hypertension, type 2 diabetes, hepatitis B, chronic kidney disease, anxiety, depression, history of falling, current smoker, arthritis, chronic low back pain with multiple surgeries, and history of a TIA  PAIN:  Are you having pain? Yes: NPRS scale: 7/10 Pain location: Right knee Pain description: sore Aggravating factors: standing, walking, transfers Relieving factors: medication and elevated her leg  PRECAUTIONS: Fall  WEIGHT BEARING RESTRICTIONS: No  FALLS:  Has patient fallen in last 6 months?  1, 05/18/2023  PATIENT GOALS: be able to go to church, cook and clean around her house, and improved mobility  NEXT MD VISIT: week of 05/14/23  OBJECTIVE:   COGNITION: Overall cognitive status: Within functional limits for tasks assessed     SENSATION: Light touch: diminished sensation in both lower extremities below the knee Patient reports numbness in both feet.   EDEMA:  Patient has compression  stockings donned limiting lower extremity edema  POSTURE: rounded shoulders and forward head  PALPATION: TTP: right quadriceps and hip adductors  LOWER EXTREMITY ROM: patient reports that she is unable to lay supine due to previous back surgeries; measured in sitting  Active ROM Right eval Left eval Right AROM 05/22/2023  Hip flexion     Hip extension     Hip abduction     Hip adduction     Hip internal rotation     Hip external rotation     Knee flexion 71/ 77 (PROM) 129 102  Knee extension 24/ 14 (PROM) 5 9  Ankle dorsiflexion     Ankle plantarflexion     Ankle inversion     Ankle eversion      (Blank rows = not tested)  LOWER EXTREMITY MMT:  MMT Right eval Left eval  Hip flexion    Hip extension    Hip abduction    Hip adduction    Hip internal rotation    Hip external rotation    Knee flexion    Knee extension    Ankle dorsiflexion    Ankle plantarflexion     Ankle inversion    Ankle eversion     (Blank rows = not tested)  LOWER EXTREMITY SPECIAL TESTS:  Not tested due to surgical condition  FUNCTIONAL TESTS:  Stand to sit transfer: required maximal UE support from armrests and NWB on RLE Stand to sit transfer: required maximal UE support from armrests and NWB on RLE   GAIT: Assistive device utilized: Environmental consultant - 2 wheeled Level of assistance: Modified independence Comments: step to pattern with poor foot clearance bilaterally and right knee flexed in stance phase  TODAY'S TREATMENT:                                                                                                                              DATE: 05/24/23 EXERCISE LOG  Exercise Repetitions and Resistance Comments  Nustep  L3 x 15 minutes; seat 8   Seated heel raises X30 reps   Seated heel slide 2 minutes    LAQ 2# x30 reps   Ball squeeze X2 min    Blank cell = exercise not performed today   Modalities: no redness or adverse reaction to today's modalities  Date: 05/24/2023 Unattended Estim: R knee, IFC, 10 mins, Pain and Edema Vaso: Knee, Low, 10 mins, Pain and Edema  PATIENT EDUCATION:  Education details: reviewed HEP, POC, healing, prognosis, and goals for therapy Person educated: Patient Education method: Explanation Education comprehension: verbalized understanding  HOME EXERCISE PROGRAM: Reviewed HEP   ASSESSMENT:  CLINICAL IMPRESSION: Patient presented in clinic with reports of higher level R knee pain after doing housework and walking in her yard. Patient able to tolerate increase resistance more today with no complaints. Normal modalities response noted following removal of the modalities.  OBJECTIVE IMPAIRMENTS: Abnormal gait, decreased activity tolerance, decreased balance, decreased mobility, difficulty walking, decreased  ROM, decreased strength, hypomobility, increased edema, impaired sensation, impaired tone, and pain.   ACTIVITY LIMITATIONS:  carrying, lifting, sitting, standing, squatting, sleeping, transfers, and locomotion level  PARTICIPATION LIMITATIONS: meal prep, cleaning, laundry, shopping, community activity, and church  PERSONAL FACTORS: 3+ comorbidities: Hypertension, type 2 diabetes, hepatitis B, chronic kidney disease, anxiety, depression, history of falling, current smoker, arthritis, chronic low back pain with multiple surgeries, and history of a TIA  are also affecting patient's functional outcome.   REHAB POTENTIAL: Good  CLINICAL DECISION MAKING: Evolving/moderate complexity  EVALUATION COMPLEXITY: Moderate  GOALS: Goals reviewed with patient? Yes  LONG TERM GOALS: Target date: 06/01/23  Patient will be independent with her HEP. Baseline:  Goal status: On-going  2.  Patient will be able to transfer from sitting to standing with minimal to no upper extremity support for improved independence. Baseline:  Goal status: On-going  3.  Patient will be able to transfer from standing to sitting with no significant deviations for improved mobility. Baseline:  Goal status: On-going  4.  Patient will be able to demonstrate at least 120 degrees of right knee flexion for improved function navigating stairs. Baseline:  Goal status: On-going  5.  Patient will be able to demonstrate right knee extension within 5 degrees of neutral for improved gait mechanics. Baseline:  Goal status: On-going   6.  Patient will report being able to attend church without being limited by her right knee for improved community mobility.  Baseline:  Goal status: On-going  PLAN:  PT FREQUENCY: 2-3x/week  PT DURATION: 4 weeks  PLANNED INTERVENTIONS: Therapeutic exercises, Therapeutic activity, Neuromuscular re-education, Balance training, Gait training, Patient/Family education, Self Care, Joint mobilization, Stair training, Electrical stimulation, Cryotherapy, Moist heat, Vasopneumatic device, Manual therapy, and  Re-evaluation  PLAN FOR NEXT SESSION: NuStep, quad set, heel slides, gastrocnemius and hamstring stretching, PROM, and modalities as needed (patient requests to avoid the bike due to low back pain)   Marvell Fuller, PTA 05/24/2023, 3:44 PM

## 2023-05-29 ENCOUNTER — Ambulatory Visit: Payer: PPO | Admitting: Physical Therapy

## 2023-05-29 ENCOUNTER — Encounter: Payer: Self-pay | Admitting: Physical Therapy

## 2023-05-29 DIAGNOSIS — M25561 Pain in right knee: Secondary | ICD-10-CM | POA: Diagnosis not present

## 2023-05-29 DIAGNOSIS — M25661 Stiffness of right knee, not elsewhere classified: Secondary | ICD-10-CM

## 2023-05-29 NOTE — Therapy (Signed)
OUTPATIENT PHYSICAL THERAPY LOWER EXTREMITY TREATMENT   Patient Name: Courtney Orozco MRN: [id number] DOB:[birth year], 72 y.o., female Today's Date: 05/29/2023  END OF SESSION:  PT End of Session - 05/29/23 1348     Visit Number 6    Number of Visits 12    Date for PT Re-Evaluation 06/01/23    PT Start Time 1345    PT Stop Time 1433    PT Time Calculation (min) 48 min    Equipment Utilized During Treatment Other (comment)   FWW   Activity Tolerance Patient tolerated treatment well    Behavior During Therapy WFL for tasks assessed/performed            Past Medical History:  Diagnosis Date   Anxiety    Arthritis    Back pain    Depression    Diabetes mellitus without complication (HCC)    no meds in 5 years    Gait instability    GERD (gastroesophageal reflux disease)    Headache    hx of migraines    Hepatitis    hx of hep B - 44 years ago    History of kidney stones    Hypertension    Hypothyroidism    Influenza A 10/20/2022   Memory loss    Pneumonia    hx of 01/2015    PONV (postoperative nausea and vomiting)    pt states "the last two times ive been put to sleep, my oxygen drops low and they have trouble waking me up.   Thyroid disease    TIA (transient ischemic attack)    Past Surgical History:  Procedure Laterality Date   ABDOMINAL HYSTERECTOMY     APPENDECTOMY     BACK SURGERY     x 4   bladder tack surgery      BREAST SURGERY Right    removal of 3 benign tumors   CATARACT EXTRACTION W/PHACO Left 04/30/2017   Procedure: CATARACT EXTRACTION PHACO AND INTRAOCULAR LENS PLACEMENT (IOC);  Surgeon: Gemma Payor, MD;  Location: [facility];  Service: Ophthalmology;  Laterality: Left;  CDE: 7.16   CATARACT EXTRACTION W/PHACO Right 05/14/2017   Procedure: CATARACT EXTRACTION PHACO AND INTRAOCULAR LENS PLACEMENT RIGHT EYE;  Surgeon: Gemma Payor, MD;  Location: [facility];  Service: Ophthalmology;  Laterality: Right;  CDE: 6.63   CHOLECYSTECTOMY N/A 08/21/2018    Procedure: LAPAROSCOPIC CHOLECYSTECTOMY;  Surgeon: Lucretia Roers, MD;  Location: [facility];  Service: General;  Laterality: N/A;   CYSTOSCOPY WITH STENT PLACEMENT Right 09/28/2017   Procedure: CYSTOSCOPY WITH STENT PLACEMENT;  Surgeon: Marcine Matar, MD;  Location: [facility];  Service: Urology;  Laterality: Right;   CYSTOSCOPY WITH URETEROSCOPY, STONE BASKETRY AND STENT PLACEMENT Right 04/06/2015   Procedure: CYSTOSCOPY WITH RIGHT URETEROSCOPY, STONE BASKETRY AND STENT PLACEMENT;  Surgeon: Bjorn Pippin, MD;  Location: [facility];  Service: Urology;  Laterality: Right;   CYSTOSCOPY/RETROGRADE/URETEROSCOPY Right 11/28/2017   Procedure: CYSTOSCOPY,  REMOVAL OF RIGHT URETERAL STENT, RIGHT RETROGRADE PYELOGRAM, RIGHT URETEROSCOPY, STONE BASKET EXTRACTION RIGHT URETERAL CALCULUS;  Surgeon: Malen Gauze, MD;  Location: [facility];  Service: Urology;  Laterality: Right;   HOLMIUM LASER APPLICATION Right 04/06/2015   Procedure: HOLMIUM LASER APPLICATION;  Surgeon: Bjorn Pippin, MD;  Location: [facility];  Service: Urology;  Laterality: Right;   INTRAMEDULLARY (IM) NAIL INTERTROCHANTERIC Right 10/11/2022   Procedure: INTRAMEDULLARY (IM) NAIL INTERTROCHANTERIC;  Surgeon: Oliver Barre, MD;  Location: [facility];  Service: Orthopedics;  Laterality: Right;   KNEE ARTHROSCOPY  WITH LATERAL MENISECTOMY Right 05/09/2022   Procedure: KNEE ARTHROSCOPY WITH LATERAL MENISECTOMY;  Surgeon: Vickki Hearing, MD;  Location: [facility];  Service: Orthopedics;  Laterality: Right;   KNEE SURGERY     left   ROTATOR CUFF REPAIR Left    thumb surgery     left   TOTAL KNEE ARTHROPLASTY Right 05/01/2023   Procedure: TOTAL KNEE ARTHROPLASTY;  Surgeon: Durene Romans, MD;  Location: [facility];  Service: Orthopedics;  Laterality: Right;   VAGINA SURGERY     Patient Active Problem List   Diagnosis Date Noted   S/P total knee arthroplasty, right 05/01/2023   S/P total knee replacement, right 05/01/2023   Displaced intertrochanteric fracture  of right femur, init (HCC) 10/10/2022   Closed displaced intertrochanteric fracture of right femur (HCC) 10/10/2022   Complex tear of lateral meniscus of right knee as current injury    Insomnia 04/26/2022   Chronic cystitis with hematuria 09/08/2020   Calculus of gallbladder without cholecystitis without obstruction 08/13/2018   Infection due to ESBL-producing Escherichia coli 07/08/2018   Sepsis secondary to UTI (HCC) 05/22/2018   GERD (gastroesophageal reflux disease) 05/22/2018   Hypothyroidism 05/22/2018   Type 2 diabetes mellitus (HCC) 05/22/2018   Anxiety and depression 05/22/2018   HCAP (healthcare-associated pneumonia) 05/22/2018   Chronic back pain 05/22/2018   Bacteremia 10/01/2017   Acute metabolic encephalopathy 09/30/2017   Shortness of breath 09/30/2017   ESBL E Coli- UTI (urinary tract infection) 09/28/2017   Leukocytosis 09/28/2017   Right ureteral stone    Frequent falls 08/15/2017   Hypotension due to drugs 02/16/2017   Syncope 01/22/2017   CKD (chronic kidney disease) stage 3, GFR 30-59 ml/min (HCC) 10/22/2015   Nephrolithiasis 04/07/2015   Renal stone 04/06/2015   Intertrigo 03/26/2015   Fasting hyperglycemia 01/25/2015   Controlled substance agreement signed 09/09/2013   Hyperlipidemia 12/27/2010   Hypertension 02/04/2010   History of colonic polyps 01/08/2001   Family history of malignant neoplasm of gastrointestinal tract 09/05/2000   Chronic hepatitis B with hepatic coma (HCC) 12/15/1999   Edema 08/09/1999   PCP: Theodoro Kos, MD  REFERRING PROVIDER: Cassandria Anger, PA-C   REFERRING DIAG: Unilateral primary osteoarthritis, right knee   THERAPY DIAG:  Acute pain of right knee  Stiffness of right knee, not elsewhere classified  Rationale for Evaluation and Treatment: Rehabilitation  ONSET DATE: 05/01/23  SUBJECTIVE:   SUBJECTIVE STATEMENT: Reports a lot of knee pain as she has been active the last several day with housework, walking in  the yard and going to church.  PERTINENT HISTORY: Hypertension, type 2 diabetes, hepatitis B, chronic kidney disease, anxiety, depression, history of falling, current smoker, arthritis, chronic low back pain with multiple surgeries, and history of a TIA  PAIN:  Are you having pain? Yes: NPRS scale: 7/10 Pain location: Right knee Pain description: sore Aggravating factors: standing, walking, transfers Relieving factors: medication and elevated her leg  PRECAUTIONS: Fall  WEIGHT BEARING RESTRICTIONS: No  FALLS:  Has patient fallen in last 6 months?  1, 05/18/2023  PATIENT GOALS: be able to go to church, cook and clean around her house, and improved mobility  NEXT MD VISIT: week of 05/14/23  OBJECTIVE:   COGNITION: Overall cognitive status: Within functional limits for tasks assessed     SENSATION: Light touch: diminished sensation in both lower extremities below the knee Patient reports numbness in both feet.   EDEMA:  Mild edema noted surrounding the R patella.  POSTURE: rounded shoulders  and forward head  PALPATION: TTP: right quadriceps and hip adductors  LOWER EXTREMITY ROM: patient reports that she is unable to lay supine due to previous back surgeries; measured in sitting  Active ROM Right eval Left eval Right AROM 05/22/2023  Hip flexion     Hip extension     Hip abduction     Hip adduction     Hip internal rotation     Hip external rotation     Knee flexion 71/ 77 (PROM) 129 102  Knee extension 24/ 14 (PROM) 5 9  Ankle dorsiflexion     Ankle plantarflexion     Ankle inversion     Ankle eversion      (Blank rows = not tested)  LOWER EXTREMITY MMT:  MMT Right eval Left eval  Hip flexion    Hip extension    Hip abduction    Hip adduction    Hip internal rotation    Hip external rotation    Knee flexion    Knee extension    Ankle dorsiflexion    Ankle plantarflexion    Ankle inversion    Ankle eversion     (Blank rows = not tested)  LOWER  EXTREMITY SPECIAL TESTS:  Not tested due to surgical condition  FUNCTIONAL TESTS:  Stand to sit transfer: required maximal UE support from armrests and NWB on RLE Stand to sit transfer: required maximal UE support from armrests and NWB on RLE   GAIT: Assistive device utilized: Environmental consultant - 2 wheeled Level of assistance: Modified independence Comments: step to pattern with poor foot clearance bilaterally and right knee flexed in stance phase  TODAY'S TREATMENT:                                                                                                                              DATE: 05/29/23 EXERCISE LOG  Exercise Repetitions and Resistance Comments  Nustep  L4 x 15 minutes; seat 9   Seated HS curl Green theraband x20 reps   Clam Green theraband x20 reps   LAQ 4# x30 reps   Ball squeeze X2 min    Blank cell = exercise not performed today   Modalities: no redness or adverse reaction to today's modalities  Date: 05/29/2023 Vaso: Knee, Low, 10 mins, Pain and Edema  PATIENT EDUCATION:  Education details: reviewed HEP, POC, healing, prognosis, and goals for therapy Person educated: Patient Education method: Explanation Education comprehension: verbalized understanding  HOME EXERCISE PROGRAM: Reviewed HEP   ASSESSMENT:  CLINICAL IMPRESSION: Patient continues to report increased pain with increased activity which she was instructed was normal considering she is trying to be more active. Less edema notable surrounding R knee. Patient able to tolerate the increased resistance of treatment well with no complaints. Normal vasopneumatic response noted following removal of the modality.  OBJECTIVE IMPAIRMENTS: Abnormal gait, decreased activity tolerance, decreased balance, decreased mobility, difficulty walking, decreased ROM, decreased strength, hypomobility, increased edema, impaired sensation,  impaired tone, and pain.   ACTIVITY LIMITATIONS: carrying, lifting, sitting, standing,  squatting, sleeping, transfers, and locomotion level  PARTICIPATION LIMITATIONS: meal prep, cleaning, laundry, shopping, community activity, and church  PERSONAL FACTORS: 3+ comorbidities: Hypertension, type 2 diabetes, hepatitis B, chronic kidney disease, anxiety, depression, history of falling, current smoker, arthritis, chronic low back pain with multiple surgeries, and history of a TIA  are also affecting patient's functional outcome.   REHAB POTENTIAL: Good  CLINICAL DECISION MAKING: Evolving/moderate complexity  EVALUATION COMPLEXITY: Moderate  GOALS: Goals reviewed with patient? Yes  LONG TERM GOALS: Target date: 06/01/23  Patient will be independent with her HEP. Baseline:  Goal status: On-going  2.  Patient will be able to transfer from sitting to standing with minimal to no upper extremity support for improved independence. Baseline:  Goal status: On-going  3.  Patient will be able to transfer from standing to sitting with no significant deviations for improved mobility. Baseline:  Goal status: On-going  4.  Patient will be able to demonstrate at least 120 degrees of right knee flexion for improved function navigating stairs. Baseline:  Goal status: On-going  5.  Patient will be able to demonstrate right knee extension within 5 degrees of neutral for improved gait mechanics. Baseline:  Goal status: On-going   6.  Patient will report being able to attend church without being limited by her right knee for improved community mobility.  Baseline:  Goal status: On-going  PLAN:  PT FREQUENCY: 2-3x/week  PT DURATION: 4 weeks  PLANNED INTERVENTIONS: Therapeutic exercises, Therapeutic activity, Neuromuscular re-education, Balance training, Gait training, Patient/Family education, Self Care, Joint mobilization, Stair training, Electrical stimulation, Cryotherapy, Moist heat, Vasopneumatic device, Manual therapy, and Re-evaluation  PLAN FOR NEXT SESSION: NuStep, quad  set, heel slides, gastrocnemius and hamstring stretching, PROM, and modalities as needed (patient requests to avoid the bike due to low back pain)   Marvell Fuller, PTA 05/29/2023, 4:52 PM

## 2023-06-05 ENCOUNTER — Ambulatory Visit: Payer: PPO

## 2023-06-05 DIAGNOSIS — M25661 Stiffness of right knee, not elsewhere classified: Secondary | ICD-10-CM

## 2023-06-05 DIAGNOSIS — M25561 Pain in right knee: Secondary | ICD-10-CM

## 2023-06-05 NOTE — Therapy (Signed)
OUTPATIENT PHYSICAL THERAPY LOWER EXTREMITY TREATMENT   Patient Name: Courtney Orozco MRN: [id number] DOB:Aug 12, 1951, 72 y.o., female Today's Date: 06/05/2023  END OF SESSION:  PT End of Session - 06/05/23 1032     Visit Number 7    Number of Visits 12    Date for PT Re-Evaluation 06/01/23    PT Start Time 1015    PT Stop Time 1058    PT Time Calculation (min) 43 min    Equipment Utilized During Treatment Other (comment)   FWW   Activity Tolerance Patient tolerated treatment well    Behavior During Therapy WFL for tasks assessed/performed             Past Medical History:  Diagnosis Date   Anxiety    Arthritis    Back pain    Depression    Diabetes mellitus without complication (HCC)    no meds in 5 years    Gait instability    GERD (gastroesophageal reflux disease)    Headache    hx of migraines    Hepatitis    hx of hep B - 44 years ago    History of kidney stones    Hypertension    Hypothyroidism    Influenza A 10/20/2022   Memory loss    Pneumonia    hx of 01/2015    PONV (postoperative nausea and vomiting)    pt states "the last two times ive been put to sleep, my oxygen drops low and they have trouble waking me up.   Thyroid disease    TIA (transient ischemic attack)    Past Surgical History:  Procedure Laterality Date   ABDOMINAL HYSTERECTOMY     APPENDECTOMY     BACK SURGERY     x 4   bladder tack surgery      BREAST SURGERY Right    removal of 3 benign tumors   CATARACT EXTRACTION W/PHACO Left 04/30/2017   Procedure: CATARACT EXTRACTION PHACO AND INTRAOCULAR LENS PLACEMENT (IOC);  Surgeon: Gemma Payor, MD;  Location: [facility];  Service: Ophthalmology;  Laterality: Left;  CDE: 7.16   CATARACT EXTRACTION W/PHACO Right 05/14/2017   Procedure: CATARACT EXTRACTION PHACO AND INTRAOCULAR LENS PLACEMENT RIGHT EYE;  Surgeon: Gemma Payor, MD;  Location: [facility];  Service: Ophthalmology;  Laterality: Right;  CDE: 6.63   CHOLECYSTECTOMY N/A 08/21/2018    Procedure: LAPAROSCOPIC CHOLECYSTECTOMY;  Surgeon: Lucretia Roers, MD;  Location: [facility];  Service: General;  Laterality: N/A;   CYSTOSCOPY WITH STENT PLACEMENT Right 09/28/2017   Procedure: CYSTOSCOPY WITH STENT PLACEMENT;  Surgeon: Marcine Matar, MD;  Location: [facility];  Service: Urology;  Laterality: Right;   CYSTOSCOPY WITH URETEROSCOPY, STONE BASKETRY AND STENT PLACEMENT Right 04/06/2015   Procedure: CYSTOSCOPY WITH RIGHT URETEROSCOPY, STONE BASKETRY AND STENT PLACEMENT;  Surgeon: Bjorn Pippin, MD;  Location: [facility];  Service: Urology;  Laterality: Right;   CYSTOSCOPY/RETROGRADE/URETEROSCOPY Right 11/28/2017   Procedure: CYSTOSCOPY,  REMOVAL OF RIGHT URETERAL STENT, RIGHT RETROGRADE PYELOGRAM, RIGHT URETEROSCOPY, STONE BASKET EXTRACTION RIGHT URETERAL CALCULUS;  Surgeon: Malen Gauze, MD;  Location: [facility];  Service: Urology;  Laterality: Right;   HOLMIUM LASER APPLICATION Right 04/06/2015   Procedure: HOLMIUM LASER APPLICATION;  Surgeon: Bjorn Pippin, MD;  Location: [facility];  Service: Urology;  Laterality: Right;   INTRAMEDULLARY (IM) NAIL INTERTROCHANTERIC Right 10/11/2022   Procedure: INTRAMEDULLARY (IM) NAIL INTERTROCHANTERIC;  Surgeon: Oliver Barre, MD;  Location: [facility];  Service: Orthopedics;  Laterality: Right;   KNEE  ARTHROSCOPY WITH LATERAL MENISECTOMY Right 05/09/2022   Procedure: KNEE ARTHROSCOPY WITH LATERAL MENISECTOMY;  Surgeon: Vickki Hearing, MD;  Location: [facility];  Service: Orthopedics;  Laterality: Right;   KNEE SURGERY     left   ROTATOR CUFF REPAIR Left    thumb surgery     left   TOTAL KNEE ARTHROPLASTY Right 05/01/2023   Procedure: TOTAL KNEE ARTHROPLASTY;  Surgeon: Durene Romans, MD;  Location: [facility];  Service: Orthopedics;  Laterality: Right;   VAGINA SURGERY     Patient Active Problem List   Diagnosis Date Noted   S/P total knee arthroplasty, right 05/01/2023   S/P total knee replacement, right 05/01/2023   Displaced intertrochanteric fracture  of right femur, init (HCC) 10/10/2022   Closed displaced intertrochanteric fracture of right femur (HCC) 10/10/2022   Complex tear of lateral meniscus of right knee as current injury    Insomnia 04/26/2022   Chronic cystitis with hematuria 09/08/2020   Calculus of gallbladder without cholecystitis without obstruction 08/13/2018   Infection due to ESBL-producing Escherichia coli 07/08/2018   Sepsis secondary to UTI (HCC) 05/22/2018   GERD (gastroesophageal reflux disease) 05/22/2018   Hypothyroidism 05/22/2018   Type 2 diabetes mellitus (HCC) 05/22/2018   Anxiety and depression 05/22/2018   HCAP (healthcare-associated pneumonia) 05/22/2018   Chronic back pain 05/22/2018   Bacteremia 10/01/2017   Acute metabolic encephalopathy 09/30/2017   Shortness of breath 09/30/2017   ESBL E Coli- UTI (urinary tract infection) 09/28/2017   Leukocytosis 09/28/2017   Right ureteral stone    Frequent falls 08/15/2017   Hypotension due to drugs 02/16/2017   Syncope 01/22/2017   CKD (chronic kidney disease) stage 3, GFR 30-59 ml/min (HCC) 10/22/2015   Nephrolithiasis 04/07/2015   Renal stone 04/06/2015   Intertrigo 03/26/2015   Fasting hyperglycemia 01/25/2015   Controlled substance agreement signed 09/09/2013   Hyperlipidemia 12/27/2010   Hypertension 02/04/2010   History of colonic polyps 01/08/2001   Family history of malignant neoplasm of gastrointestinal tract 09/05/2000   Chronic hepatitis B with hepatic coma (HCC) 12/15/1999   Edema 08/09/1999   PCP: Theodoro Kos, MD  REFERRING PROVIDER: Cassandria Anger, PA-C   REFERRING DIAG: Unilateral primary osteoarthritis, right knee   THERAPY DIAG:  Acute pain of right knee  Stiffness of right knee, not elsewhere classified  Rationale for Evaluation and Treatment: Rehabilitation  ONSET DATE: 05/01/23  SUBJECTIVE:   SUBJECTIVE STATEMENT: Patient reports that she was able to drive for the first time today. She has not needed any pain  medication or muscle relaxer for her knee. She has begun cooking and cleaning at home.   PERTINENT HISTORY: Hypertension, type 2 diabetes, hepatitis B, chronic kidney disease, anxiety, depression, history of falling, current smoker, arthritis, chronic low back pain with multiple surgeries, and history of a TIA  PAIN:  Are you having pain? Yes: NPRS scale: 4/10 Pain location: Right knee Pain description: sore Aggravating factors: standing, walking, transfers Relieving factors: medication and elevated her leg  PRECAUTIONS: Fall  WEIGHT BEARING RESTRICTIONS: No  FALLS:  Has patient fallen in last 6 months?  1, 05/18/2023  PATIENT GOALS: be able to go to church, cook and clean around her house, and improved mobility  NEXT MD VISIT: week of 05/14/23  OBJECTIVE:   COGNITION: Overall cognitive status: Within functional limits for tasks assessed     SENSATION: Light touch: diminished sensation in both lower extremities below the knee Patient reports numbness in both feet.   EDEMA:  Mild edema noted surrounding the R patella.  POSTURE: rounded shoulders and forward head  PALPATION: TTP: right quadriceps and hip adductors  LOWER EXTREMITY ROM: patient reports that she is unable to lay supine due to previous back surgeries; measured in sitting  Active ROM Right eval Left eval Right AROM 05/22/2023  Hip flexion     Hip extension     Hip abduction     Hip adduction     Hip internal rotation     Hip external rotation     Knee flexion 71/ 77 (PROM) 129 102  Knee extension 24/ 14 (PROM) 5 9  Ankle dorsiflexion     Ankle plantarflexion     Ankle inversion     Ankle eversion      (Blank rows = not tested)  LOWER EXTREMITY MMT:  MMT Right eval Left eval  Hip flexion    Hip extension    Hip abduction    Hip adduction    Hip internal rotation    Hip external rotation    Knee flexion    Knee extension    Ankle dorsiflexion    Ankle plantarflexion    Ankle inversion     Ankle eversion     (Blank rows = not tested)  LOWER EXTREMITY SPECIAL TESTS:  Not tested due to surgical condition  FUNCTIONAL TESTS:  Stand to sit transfer: required maximal UE support from armrests and NWB on RLE Stand to sit transfer: required maximal UE support from armrests and NWB on RLE   GAIT: Assistive device utilized: Environmental consultant - 2 wheeled Level of assistance: Modified independence Comments: step to pattern with poor foot clearance bilaterally and right knee flexed in stance phase  TODAY'S TREATMENT:                                                                                                                              DATE:                                   06/05/23 EXERCISE LOG  Exercise Repetitions and Resistance Comments  Nustep  L5 x 16 minutes; seat 7   Marching on foam  2 minutes  BUE support  Static stance on foam  2.5 minutes  Intermittent UE support   Standing hip ABD  20 reps each    Standing heel/toe raises 2 minutes    LAQ 5# x 2 minutes    Blank cell = exercise not performed today  Modalities  Date:  Vaso: Knee, 34 degrees; low pressure, 5 mins, Pain   05/29/23 EXERCISE LOG  Exercise Repetitions and Resistance Comments  Nustep  L4 x 15 minutes; seat 9   Seated HS curl Green theraband x20 reps   Clam Green theraband x20 reps   LAQ 4# x30 reps   Ball squeeze X2 min    Blank cell =  exercise not performed today   Modalities: no redness or adverse reaction to today's modalities  Date: 05/29/2023 Vaso: Knee, Low, 10 mins, Pain and Edema  PATIENT EDUCATION:  Education details: reviewed HEP, POC, healing, prognosis, and goals for therapy Person educated: Patient Education method: Explanation Education comprehension: verbalized understanding  HOME EXERCISE PROGRAM: Reviewed HEP   ASSESSMENT:  CLINICAL IMPRESSION: Patient was progressed with multiple new standing interventions for improved mobility. She required minimal cueing with today's  interventions for proper exercise performance. She experienced no increase in pain or discomfort with any of today's interventions. She reported that her knee felt "great" upon the conclusion of treatment. She continues to require skilled physical therapy to address her remaining impairments to return to her prior level of function.   OBJECTIVE IMPAIRMENTS: Abnormal gait, decreased activity tolerance, decreased balance, decreased mobility, difficulty walking, decreased ROM, decreased strength, hypomobility, increased edema, impaired sensation, impaired tone, and pain.   ACTIVITY LIMITATIONS: carrying, lifting, sitting, standing, squatting, sleeping, transfers, and locomotion level  PARTICIPATION LIMITATIONS: meal prep, cleaning, laundry, shopping, community activity, and church  PERSONAL FACTORS: 3+ comorbidities: Hypertension, type 2 diabetes, hepatitis B, chronic kidney disease, anxiety, depression, history of falling, current smoker, arthritis, chronic low back pain with multiple surgeries, and history of a TIA  are also affecting patient's functional outcome.   REHAB POTENTIAL: Good  CLINICAL DECISION MAKING: Evolving/moderate complexity  EVALUATION COMPLEXITY: Moderate  GOALS: Goals reviewed with patient? Yes  LONG TERM GOALS: Target date: 06/01/23  Patient will be independent with her HEP. Baseline:  Goal status: On-going  2.  Patient will be able to transfer from sitting to standing with minimal to no upper extremity support for improved independence. Baseline:  Goal status: On-going  3.  Patient will be able to transfer from standing to sitting with no significant deviations for improved mobility. Baseline:  Goal status: On-going  4.  Patient will be able to demonstrate at least 120 degrees of right knee flexion for improved function navigating stairs. Baseline:  Goal status: On-going  5.  Patient will be able to demonstrate right knee extension within 5 degrees of neutral  for improved gait mechanics. Baseline:  Goal status: On-going   6.  Patient will report being able to attend church without being limited by her right knee for improved community mobility.  Baseline:  Goal status: On-going  PLAN:  PT FREQUENCY: 2-3x/week  PT DURATION: 4 weeks  PLANNED INTERVENTIONS: Therapeutic exercises, Therapeutic activity, Neuromuscular re-education, Balance training, Gait training, Patient/Family education, Self Care, Joint mobilization, Stair training, Electrical stimulation, Cryotherapy, Moist heat, Vasopneumatic device, Manual therapy, and Re-evaluation  PLAN FOR NEXT SESSION: NuStep, quad set, heel slides, gastrocnemius and hamstring stretching, PROM, and modalities as needed (patient requests to avoid the bike due to low back pain)   Granville Lewis, PT 06/05/2023, 12:58 PM

## 2023-06-07 ENCOUNTER — Encounter: Payer: Self-pay | Admitting: Physical Therapy

## 2023-06-07 ENCOUNTER — Ambulatory Visit: Payer: PPO | Admitting: Physical Therapy

## 2023-06-07 DIAGNOSIS — M25661 Stiffness of right knee, not elsewhere classified: Secondary | ICD-10-CM

## 2023-06-07 DIAGNOSIS — M25561 Pain in right knee: Secondary | ICD-10-CM

## 2023-06-07 NOTE — Therapy (Signed)
OUTPATIENT PHYSICAL THERAPY LOWER EXTREMITY TREATMENT   Patient Name: Courtney Orozco MRN: [id number] DOB:[birth year], 73 y.o., female Today's Date: 06/07/2023  END OF SESSION:  PT End of Session - 06/07/23 1118     Visit Number 8    Number of Visits 12    Date for PT Re-Evaluation 06/01/23    PT Start Time 1102    PT Stop Time 1147    PT Time Calculation (min) 45 min    Equipment Utilized During Treatment Other (comment)   FWW   Activity Tolerance Patient tolerated treatment well    Behavior During Therapy WFL for tasks assessed/performed             Past Medical History:  Diagnosis Date   Anxiety    Arthritis    Back pain    Depression    Diabetes mellitus without complication (HCC)    no meds in 5 years    Gait instability    GERD (gastroesophageal reflux disease)    Headache    hx of migraines    Hepatitis    hx of hep B - 44 years ago    History of kidney stones    Hypertension    Hypothyroidism    Influenza A 10/20/2022   Memory loss    Pneumonia    hx of 01/2015    PONV (postoperative nausea and vomiting)    pt states "the last two times ive been put to sleep, my oxygen drops low and they have trouble waking me up.   Thyroid disease    TIA (transient ischemic attack)    Past Surgical History:  Procedure Laterality Date   ABDOMINAL HYSTERECTOMY     APPENDECTOMY     BACK SURGERY     x 4   bladder tack surgery      BREAST SURGERY Right    removal of 3 benign tumors   CATARACT EXTRACTION W/PHACO Left 04/30/2017   Procedure: CATARACT EXTRACTION PHACO AND INTRAOCULAR LENS PLACEMENT (IOC);  Surgeon: Gemma Payor, MD;  Location: [facility];  Service: Ophthalmology;  Laterality: Left;  CDE: 7.16   CATARACT EXTRACTION W/PHACO Right 05/14/2017   Procedure: CATARACT EXTRACTION PHACO AND INTRAOCULAR LENS PLACEMENT RIGHT EYE;  Surgeon: Gemma Payor, MD;  Location: [facility];  Service: Ophthalmology;  Laterality: Right;  CDE: 6.63   CHOLECYSTECTOMY N/A 08/21/2018    Procedure: LAPAROSCOPIC CHOLECYSTECTOMY;  Surgeon: Lucretia Roers, MD;  Location: [facility];  Service: General;  Laterality: N/A;   CYSTOSCOPY WITH STENT PLACEMENT Right 09/28/2017   Procedure: CYSTOSCOPY WITH STENT PLACEMENT;  Surgeon: Marcine Matar, MD;  Location: [facility];  Service: Urology;  Laterality: Right;   CYSTOSCOPY WITH URETEROSCOPY, STONE BASKETRY AND STENT PLACEMENT Right 04/06/2015   Procedure: CYSTOSCOPY WITH RIGHT URETEROSCOPY, STONE BASKETRY AND STENT PLACEMENT;  Surgeon: Bjorn Pippin, MD;  Location: [facility];  Service: Urology;  Laterality: Right;   CYSTOSCOPY/RETROGRADE/URETEROSCOPY Right 11/28/2017   Procedure: CYSTOSCOPY,  REMOVAL OF RIGHT URETERAL STENT, RIGHT RETROGRADE PYELOGRAM, RIGHT URETEROSCOPY, STONE BASKET EXTRACTION RIGHT URETERAL CALCULUS;  Surgeon: Malen Gauze, MD;  Location: [facility];  Service: Urology;  Laterality: Right;   HOLMIUM LASER APPLICATION Right 04/06/2015   Procedure: HOLMIUM LASER APPLICATION;  Surgeon: Bjorn Pippin, MD;  Location: [facility];  Service: Urology;  Laterality: Right;   INTRAMEDULLARY (IM) NAIL INTERTROCHANTERIC Right 10/11/2022   Procedure: INTRAMEDULLARY (IM) NAIL INTERTROCHANTERIC;  Surgeon: Oliver Barre, MD;  Location: [facility];  Service: Orthopedics;  Laterality: Right;   KNEE  ARTHROSCOPY WITH LATERAL MENISECTOMY Right 05/09/2022   Procedure: KNEE ARTHROSCOPY WITH LATERAL MENISECTOMY;  Surgeon: Vickki Hearing, MD;  Location: [facility];  Service: Orthopedics;  Laterality: Right;   KNEE SURGERY     left   ROTATOR CUFF REPAIR Left    thumb surgery     left   TOTAL KNEE ARTHROPLASTY Right 05/01/2023   Procedure: TOTAL KNEE ARTHROPLASTY;  Surgeon: Durene Romans, MD;  Location: [facility];  Service: Orthopedics;  Laterality: Right;   VAGINA SURGERY     Patient Active Problem List   Diagnosis Date Noted   S/P total knee arthroplasty, right 05/01/2023   S/P total knee replacement, right 05/01/2023   Displaced intertrochanteric fracture  of right femur, init (HCC) 10/10/2022   Closed displaced intertrochanteric fracture of right femur (HCC) 10/10/2022   Complex tear of lateral meniscus of right knee as current injury    Insomnia 04/26/2022   Chronic cystitis with hematuria 09/08/2020   Calculus of gallbladder without cholecystitis without obstruction 08/13/2018   Infection due to ESBL-producing Escherichia coli 07/08/2018   Sepsis secondary to UTI (HCC) 05/22/2018   GERD (gastroesophageal reflux disease) 05/22/2018   Hypothyroidism 05/22/2018   Type 2 diabetes mellitus (HCC) 05/22/2018   Anxiety and depression 05/22/2018   HCAP (healthcare-associated pneumonia) 05/22/2018   Chronic back pain 05/22/2018   Bacteremia 10/01/2017   Acute metabolic encephalopathy 09/30/2017   Shortness of breath 09/30/2017   ESBL E Coli- UTI (urinary tract infection) 09/28/2017   Leukocytosis 09/28/2017   Right ureteral stone    Frequent falls 08/15/2017   Hypotension due to drugs 02/16/2017   Syncope 01/22/2017   CKD (chronic kidney disease) stage 3, GFR 30-59 ml/min (HCC) 10/22/2015   Nephrolithiasis 04/07/2015   Renal stone 04/06/2015   Intertrigo 03/26/2015   Fasting hyperglycemia 01/25/2015   Controlled substance agreement signed 09/09/2013   Hyperlipidemia 12/27/2010   Hypertension 02/04/2010   History of colonic polyps 01/08/2001   Family history of malignant neoplasm of gastrointestinal tract 09/05/2000   Chronic hepatitis B with hepatic coma (HCC) 12/15/1999   Edema 08/09/1999   PCP: Theodoro Kos, MD  REFERRING PROVIDER: Cassandria Anger, PA-C   REFERRING DIAG: Unilateral primary osteoarthritis, right knee   THERAPY DIAG:  Acute pain of right knee  Stiffness of right knee, not elsewhere classified  Rationale for Evaluation and Treatment: Rehabilitation  ONSET DATE: 05/01/23  SUBJECTIVE:   SUBJECTIVE STATEMENT: Not feeling well today as she is getting a sinus infection.  PERTINENT HISTORY: Hypertension,  type 2 diabetes, hepatitis B, chronic kidney disease, anxiety, depression, history of falling, current smoker, arthritis, chronic low back pain with multiple surgeries, and history of a TIA  PAIN:  Are you having pain? Yes: NPRS scale: 4/10 Pain location: Right knee Pain description: sore Aggravating factors: standing, walking, transfers Relieving factors: medication and elevated her leg  PRECAUTIONS: Fall  WEIGHT BEARING RESTRICTIONS: No  FALLS:  Has patient fallen in last 6 months?  1, 05/18/2023  PATIENT GOALS: be able to go to church, cook and clean around her house, and improved mobility  NEXT MD VISIT: week of 05/14/23  OBJECTIVE:   COGNITION: Overall cognitive status: Within functional limits for tasks assessed     SENSATION: Light touch: diminished sensation in both lower extremities below the knee Patient reports numbness in both feet.   EDEMA:  Mild edema noted surrounding the R patella.  POSTURE: rounded shoulders and forward head  PALPATION: TTP: right quadriceps and hip adductors  LOWER  EXTREMITY ROM: patient reports that she is unable to lay supine due to previous back surgeries; measured in sitting  Active ROM Right eval Left eval Right AROM 05/22/2023  Hip flexion     Hip extension     Hip abduction     Hip adduction     Hip internal rotation     Hip external rotation     Knee flexion 71/ 77 (PROM) 129 102  Knee extension 24/ 14 (PROM) 5 9  Ankle dorsiflexion     Ankle plantarflexion     Ankle inversion     Ankle eversion      (Blank rows = not tested)  LOWER EXTREMITY MMT:  MMT Right eval Left eval  Hip flexion    Hip extension    Hip abduction    Hip adduction    Hip internal rotation    Hip external rotation    Knee flexion    Knee extension    Ankle dorsiflexion    Ankle plantarflexion    Ankle inversion    Ankle eversion     (Blank rows = not tested)  LOWER EXTREMITY SPECIAL TESTS:  Not tested due to surgical  condition  FUNCTIONAL TESTS:  Stand to sit transfer: required maximal UE support from armrests and NWB on RLE Stand to sit transfer: required maximal UE support from armrests and NWB on RLE   GAIT: Assistive device utilized: Environmental consultant - 2 wheeled Level of assistance: Modified independence Comments: step to pattern with poor foot clearance bilaterally and right knee flexed in stance phase  TODAY'S TREATMENT:                                                                                                                              DATE: 06/05/23 EXERCISE LOG  Exercise Repetitions and Resistance Comments  Nustep  L5 x 19 minutes; seat 7   LAQ 4# x 30 reps   Seated HS curl Green theraband x30 reps    Seated clam Green theraband x30 reps    Blank cell = exercise not performed today  Modalities  Date: 06/07/23 Vaso: Knee, 34 degrees; low pressure, 10 mins, Pain  PATIENT EDUCATION:  Education details: reviewed HEP, POC, healing, prognosis, and goals for therapy Person educated: Patient Education method: Explanation Education comprehension: verbalized understanding  HOME EXERCISE PROGRAM: Reviewed HEP   ASSESSMENT:  CLINICAL IMPRESSION: Patient presented in clinic not feeling well and stated that she may not be able to do much exercise today. Patient able to complete seated exercises but stopped LAQ due to L hip pain. Normal vasopneumatic response noted following removal of the modality.  OBJECTIVE IMPAIRMENTS: Abnormal gait, decreased activity tolerance, decreased balance, decreased mobility, difficulty walking, decreased ROM, decreased strength, hypomobility, increased edema, impaired sensation, impaired tone, and pain.   ACTIVITY LIMITATIONS: carrying, lifting, sitting, standing, squatting, sleeping, transfers, and locomotion level  PARTICIPATION LIMITATIONS: meal prep, cleaning, laundry, shopping, community activity, and church  PERSONAL  FACTORS: 3+ comorbidities: Hypertension,  type 2 diabetes, hepatitis B, chronic kidney disease, anxiety, depression, history of falling, current smoker, arthritis, chronic low back pain with multiple surgeries, and history of a TIA  are also affecting patient's functional outcome.   REHAB POTENTIAL: Good  CLINICAL DECISION MAKING: Evolving/moderate complexity  EVALUATION COMPLEXITY: Moderate  GOALS: Goals reviewed with patient? Yes  LONG TERM GOALS: Target date: 06/01/23  Patient will be independent with her HEP. Baseline:  Goal status: On-going  2.  Patient will be able to transfer from sitting to standing with minimal to no upper extremity support for improved independence. Baseline:  Goal status: On-going  3.  Patient will be able to transfer from standing to sitting with no significant deviations for improved mobility. Baseline:  Goal status: On-going  4.  Patient will be able to demonstrate at least 120 degrees of right knee flexion for improved function navigating stairs. Baseline:  Goal status: On-going  5.  Patient will be able to demonstrate right knee extension within 5 degrees of neutral for improved gait mechanics. Baseline:  Goal status: On-going   6.  Patient will report being able to attend church without being limited by her right knee for improved community mobility.  Baseline:  Goal status: On-going  PLAN:  PT FREQUENCY: 2-3x/week  PT DURATION: 4 weeks  PLANNED INTERVENTIONS: Therapeutic exercises, Therapeutic activity, Neuromuscular re-education, Balance training, Gait training, Patient/Family education, Self Care, Joint mobilization, Stair training, Electrical stimulation, Cryotherapy, Moist heat, Vasopneumatic device, Manual therapy, and Re-evaluation  PLAN FOR NEXT SESSION: NuStep, quad set, heel slides, gastrocnemius and hamstring stretching, PROM, and modalities as needed (patient requests to avoid the bike due to low back pain)   Marvell Fuller, PTA 06/07/2023, 12:03 PM

## 2023-06-12 ENCOUNTER — Ambulatory Visit: Payer: PPO

## 2023-06-14 ENCOUNTER — Ambulatory Visit: Payer: PPO

## 2023-06-14 DIAGNOSIS — M25561 Pain in right knee: Secondary | ICD-10-CM | POA: Diagnosis not present

## 2023-06-14 DIAGNOSIS — M25661 Stiffness of right knee, not elsewhere classified: Secondary | ICD-10-CM

## 2023-06-14 NOTE — Therapy (Signed)
OUTPATIENT PHYSICAL THERAPY LOWER EXTREMITY TREATMENT   Patient Name: Courtney Orozco MRN: [id number] DOB:11-Aug-1951, 72 y.o., female Today's Date: 06/14/2023  END OF SESSION:  PT End of Session - 06/14/23 1019     Visit Number 9    Number of Visits 12    Date for PT Re-Evaluation 06/01/23    PT Start Time 1015    PT Stop Time 1100    PT Time Calculation (min) 45 min    Equipment Utilized During Treatment Other (comment)   FWW   Activity Tolerance Patient tolerated treatment well    Behavior During Therapy WFL for tasks assessed/performed             Past Medical History:  Diagnosis Date   Anxiety    Arthritis    Back pain    Depression    Diabetes mellitus without complication (HCC)    no meds in 5 years    Gait instability    GERD (gastroesophageal reflux disease)    Headache    hx of migraines    Hepatitis    hx of hep B - 44 years ago    History of kidney stones    Hypertension    Hypothyroidism    Influenza A 10/20/2022   Memory loss    Pneumonia    hx of 01/2015    PONV (postoperative nausea and vomiting)    pt states "the last two times ive been put to sleep, my oxygen drops low and they have trouble waking me up.   Thyroid disease    TIA (transient ischemic attack)    Past Surgical History:  Procedure Laterality Date   ABDOMINAL HYSTERECTOMY     APPENDECTOMY     BACK SURGERY     x 4   bladder tack surgery      BREAST SURGERY Right    removal of 3 benign tumors   CATARACT EXTRACTION W/PHACO Left 04/30/2017   Procedure: CATARACT EXTRACTION PHACO AND INTRAOCULAR LENS PLACEMENT (IOC);  Surgeon: Gemma Payor, MD;  Location: [facility];  Service: Ophthalmology;  Laterality: Left;  CDE: 7.16   CATARACT EXTRACTION W/PHACO Right 05/14/2017   Procedure: CATARACT EXTRACTION PHACO AND INTRAOCULAR LENS PLACEMENT RIGHT EYE;  Surgeon: Gemma Payor, MD;  Location: [facility];  Service: Ophthalmology;  Laterality: Right;  CDE: 6.63   CHOLECYSTECTOMY N/A 08/21/2018    Procedure: LAPAROSCOPIC CHOLECYSTECTOMY;  Surgeon: Lucretia Roers, MD;  Location: [facility];  Service: General;  Laterality: N/A;   CYSTOSCOPY WITH STENT PLACEMENT Right 09/28/2017   Procedure: CYSTOSCOPY WITH STENT PLACEMENT;  Surgeon: Marcine Matar, MD;  Location: [facility];  Service: Urology;  Laterality: Right;   CYSTOSCOPY WITH URETEROSCOPY, STONE BASKETRY AND STENT PLACEMENT Right 04/06/2015   Procedure: CYSTOSCOPY WITH RIGHT URETEROSCOPY, STONE BASKETRY AND STENT PLACEMENT;  Surgeon: Bjorn Pippin, MD;  Location: [facility];  Service: Urology;  Laterality: Right;   CYSTOSCOPY/RETROGRADE/URETEROSCOPY Right 11/28/2017   Procedure: CYSTOSCOPY,  REMOVAL OF RIGHT URETERAL STENT, RIGHT RETROGRADE PYELOGRAM, RIGHT URETEROSCOPY, STONE BASKET EXTRACTION RIGHT URETERAL CALCULUS;  Surgeon: Malen Gauze, MD;  Location: [facility];  Service: Urology;  Laterality: Right;   HOLMIUM LASER APPLICATION Right 04/06/2015   Procedure: HOLMIUM LASER APPLICATION;  Surgeon: Bjorn Pippin, MD;  Location: [facility];  Service: Urology;  Laterality: Right;   INTRAMEDULLARY (IM) NAIL INTERTROCHANTERIC Right 10/11/2022   Procedure: INTRAMEDULLARY (IM) NAIL INTERTROCHANTERIC;  Surgeon: Oliver Barre, MD;  Location: [facility];  Service: Orthopedics;  Laterality: Right;   KNEE  ARTHROSCOPY WITH LATERAL MENISECTOMY Right 05/09/2022   Procedure: KNEE ARTHROSCOPY WITH LATERAL MENISECTOMY;  Surgeon: Vickki Hearing, MD;  Location: [facility];  Service: Orthopedics;  Laterality: Right;   KNEE SURGERY     left   ROTATOR CUFF REPAIR Left    thumb surgery     left   TOTAL KNEE ARTHROPLASTY Right 05/01/2023   Procedure: TOTAL KNEE ARTHROPLASTY;  Surgeon: Durene Romans, MD;  Location: [facility];  Service: Orthopedics;  Laterality: Right;   VAGINA SURGERY     Patient Active Problem List   Diagnosis Date Noted   S/P total knee arthroplasty, right 05/01/2023   S/P total knee replacement, right 05/01/2023   Displaced intertrochanteric fracture  of right femur, init (HCC) 10/10/2022   Closed displaced intertrochanteric fracture of right femur (HCC) 10/10/2022   Complex tear of lateral meniscus of right knee as current injury    Insomnia 04/26/2022   Chronic cystitis with hematuria 09/08/2020   Calculus of gallbladder without cholecystitis without obstruction 08/13/2018   Infection due to ESBL-producing Escherichia coli 07/08/2018   Sepsis secondary to UTI (HCC) 05/22/2018   GERD (gastroesophageal reflux disease) 05/22/2018   Hypothyroidism 05/22/2018   Type 2 diabetes mellitus (HCC) 05/22/2018   Anxiety and depression 05/22/2018   HCAP (healthcare-associated pneumonia) 05/22/2018   Chronic back pain 05/22/2018   Bacteremia 10/01/2017   Acute metabolic encephalopathy 09/30/2017   Shortness of breath 09/30/2017   ESBL E Coli- UTI (urinary tract infection) 09/28/2017   Leukocytosis 09/28/2017   Right ureteral stone    Frequent falls 08/15/2017   Hypotension due to drugs 02/16/2017   Syncope 01/22/2017   CKD (chronic kidney disease) stage 3, GFR 30-59 ml/min (HCC) 10/22/2015   Nephrolithiasis 04/07/2015   Renal stone 04/06/2015   Intertrigo 03/26/2015   Fasting hyperglycemia 01/25/2015   Controlled substance agreement signed 09/09/2013   Hyperlipidemia 12/27/2010   Hypertension 02/04/2010   History of colonic polyps 01/08/2001   Family history of malignant neoplasm of gastrointestinal tract 09/05/2000   Chronic hepatitis B with hepatic coma (HCC) 12/15/1999   Edema 08/09/1999   PCP: Theodoro Kos, MD  REFERRING PROVIDER: Cassandria Anger, PA-C   REFERRING DIAG: Unilateral primary osteoarthritis, right knee   THERAPY DIAG:  Acute pain of right knee  Stiffness of right knee, not elsewhere classified  Rationale for Evaluation and Treatment: Rehabilitation  ONSET DATE: 05/01/23  SUBJECTIVE:   SUBJECTIVE STATEMENT: Pt reports 3-4/10 right knee pain today.  Pt reports feeling weak since having COVID last week.     PERTINENT HISTORY: Hypertension, type 2 diabetes, hepatitis B, chronic kidney disease, anxiety, depression, history of falling, current smoker, arthritis, chronic low back pain with multiple surgeries, and history of a TIA  PAIN:  Are you having pain? Yes: NPRS scale: 3-4/10 Pain location: Right knee Pain description: sore Aggravating factors: standing, walking, transfers Relieving factors: medication and elevated her leg  PRECAUTIONS: Fall  WEIGHT BEARING RESTRICTIONS: No  FALLS:  Has patient fallen in last 6 months?  1, 05/18/2023  PATIENT GOALS: be able to go to church, cook and clean around her house, and improved mobility  NEXT MD VISIT: week of 05/14/23  OBJECTIVE:   COGNITION: Overall cognitive status: Within functional limits for tasks assessed     SENSATION: Light touch: diminished sensation in both lower extremities below the knee Patient reports numbness in both feet.   EDEMA:  Mild edema noted surrounding the R patella.  POSTURE: rounded shoulders and forward head  PALPATION:  TTP: right quadriceps and hip adductors  LOWER EXTREMITY ROM: patient reports that she is unable to lay supine due to previous back surgeries; measured in sitting  Active ROM Right eval Left eval Right AROM 05/22/2023  Hip flexion     Hip extension     Hip abduction     Hip adduction     Hip internal rotation     Hip external rotation     Knee flexion 71/ 77 (PROM) 129 102  Knee extension 24/ 14 (PROM) 5 9  Ankle dorsiflexion     Ankle plantarflexion     Ankle inversion     Ankle eversion      (Blank rows = not tested)  LOWER EXTREMITY MMT:  MMT Right eval Left eval  Hip flexion    Hip extension    Hip abduction    Hip adduction    Hip internal rotation    Hip external rotation    Knee flexion    Knee extension    Ankle dorsiflexion    Ankle plantarflexion    Ankle inversion    Ankle eversion     (Blank rows = not tested)  LOWER EXTREMITY SPECIAL TESTS:   Not tested due to surgical condition  FUNCTIONAL TESTS:  Stand to sit transfer: required maximal UE support from armrests and NWB on RLE Stand to sit transfer: required maximal UE support from armrests and NWB on RLE   GAIT: Assistive device utilized: Environmental consultant - 2 wheeled Level of assistance: Modified independence Comments: step to pattern with poor foot clearance bilaterally and right knee flexed in stance phase  TODAY'S TREATMENT:                                                                                                                              DATE: 06/14/23 EXERCISE LOG  Exercise Repetitions and Resistance Comments  Nustep  L4 x 17 minutes; seat 6   LAQ 5# x 20 reps   Seated Marches  5# x 20 reps   Seated Hip Adduction 3 mins   Seated HS curl Blue theraband x20 reps    Seated clam Blue theraband x30 reps    Blank cell = exercise not performed today  Modalities  Date: 06/14/23 Vaso: Knee, 34 degrees; low pressure, 10 mins, Pain  PATIENT EDUCATION:  Education details: reviewed HEP, POC, healing, prognosis, and goals for therapy Person educated: Patient Education method: Explanation Education comprehension: verbalized understanding  HOME EXERCISE PROGRAM: Reviewed HEP   ASSESSMENT:  CLINICAL IMPRESSION: Pt arrives for today's treatment session 3-4/10 right knee pain.  Pt states that she has an appointment with her MD on Monday.  Pt reports increased lack of endurance since having COVID last week.  Pt able to tolerate increased resistance with all exercises today without issue or complaint of pain.  Normal responses to vaso noted upon removal.  Pt reported decreased pain at completion of today's treatment session.  OBJECTIVE IMPAIRMENTS:  Abnormal gait, decreased activity tolerance, decreased balance, decreased mobility, difficulty walking, decreased ROM, decreased strength, hypomobility, increased edema, impaired sensation, impaired tone, and pain.   ACTIVITY  LIMITATIONS: carrying, lifting, sitting, standing, squatting, sleeping, transfers, and locomotion level  PARTICIPATION LIMITATIONS: meal prep, cleaning, laundry, shopping, community activity, and church  PERSONAL FACTORS: 3+ comorbidities: Hypertension, type 2 diabetes, hepatitis B, chronic kidney disease, anxiety, depression, history of falling, current smoker, arthritis, chronic low back pain with multiple surgeries, and history of a TIA  are also affecting patient's functional outcome.   REHAB POTENTIAL: Good  CLINICAL DECISION MAKING: Evolving/moderate complexity  EVALUATION COMPLEXITY: Moderate  GOALS: Goals reviewed with patient? Yes  LONG TERM GOALS: Target date: 06/01/23  Patient will be independent with her HEP. Baseline:  Goal status: MET  2.  Patient will be able to transfer from sitting to standing with minimal to no upper extremity support for improved independence. Baseline:  Goal status: MET  3.  Patient will be able to transfer from standing to sitting with no significant deviations for improved mobility. Baseline:  Goal status: MET  4.  Patient will be able to demonstrate at least 120 degrees of right knee flexion for improved function navigating stairs. Baseline:  Goal status: On-going  5.  Patient will be able to demonstrate right knee extension within 5 degrees of neutral for improved gait mechanics. Baseline:  Goal status: On-going   6.  Patient will report being able to attend church without being limited by her right knee for improved community mobility.  Baseline:  Goal status: MET  PLAN:  PT FREQUENCY: 2-3x/week  PT DURATION: 4 weeks  PLANNED INTERVENTIONS: Therapeutic exercises, Therapeutic activity, Neuromuscular re-education, Balance training, Gait training, Patient/Family education, Self Care, Joint mobilization, Stair training, Electrical stimulation, Cryotherapy, Moist heat, Vasopneumatic device, Manual therapy, and Re-evaluation  PLAN FOR  NEXT SESSION: NuStep, quad set, heel slides, gastrocnemius and hamstring stretching, PROM, and modalities as needed (patient requests to avoid the bike due to low back pain)   Newman Pies, PTA 06/14/2023, 11:16 AM

## 2023-06-19 ENCOUNTER — Ambulatory Visit: Payer: PPO | Admitting: Physical Therapy

## 2024-03-07 ENCOUNTER — Other Ambulatory Visit: Payer: Self-pay | Admitting: Urology

## 2024-06-13 ENCOUNTER — Encounter: Payer: Self-pay | Admitting: Radiology

## 2024-08-25 ENCOUNTER — Encounter: Payer: Self-pay | Admitting: Radiology
